# Patient Record
Sex: Male | Born: 1965
Health system: Southern US, Community
[De-identification: ages and names within clinical notes are randomized; demographics above are authoritative.]

## PROBLEM LIST (undated history)

## (undated) DIAGNOSIS — R0902 Hypoxemia: Secondary | ICD-10-CM

## (undated) DIAGNOSIS — N182 Chronic kidney disease, stage 2 (mild): Secondary | ICD-10-CM

## (undated) DIAGNOSIS — J90 Pleural effusion, not elsewhere classified: Secondary | ICD-10-CM

## (undated) DIAGNOSIS — I639 Cerebral infarction, unspecified: Secondary | ICD-10-CM

## (undated) DIAGNOSIS — K859 Acute pancreatitis without necrosis or infection, unspecified: Secondary | ICD-10-CM

## (undated) DIAGNOSIS — I700 Atherosclerosis of aorta: Secondary | ICD-10-CM

## (undated) DIAGNOSIS — D734 Cyst of spleen: Secondary | ICD-10-CM

## (undated) DIAGNOSIS — M4800 Spinal stenosis, site unspecified: Secondary | ICD-10-CM

## (undated) DIAGNOSIS — E8809 Other disorders of plasma-protein metabolism, not elsewhere classified: Secondary | ICD-10-CM

## (undated) DIAGNOSIS — I10 Essential (primary) hypertension: Secondary | ICD-10-CM

## (undated) DIAGNOSIS — K8020 Calculus of gallbladder without cholecystitis without obstruction: Secondary | ICD-10-CM

## (undated) DIAGNOSIS — K5790 Diverticulosis of intestine, part unspecified, without perforation or abscess without bleeding: Secondary | ICD-10-CM

## (undated) DIAGNOSIS — I509 Heart failure, unspecified: Secondary | ICD-10-CM

## (undated) HISTORY — DX: Other disorders of plasma-protein metabolism, not elsewhere classified: E88.09

## (undated) HISTORY — DX: Heart failure, unspecified: I50.9

## (undated) HISTORY — DX: Essential (primary) hypertension: I10

## (undated) HISTORY — DX: Calculus of gallbladder without cholecystitis without obstruction: K80.20

## (undated) HISTORY — DX: Diverticulosis of intestine, part unspecified, without perforation or abscess without bleeding: K57.90

## (undated) HISTORY — DX: Spinal stenosis, site unspecified: M48.00

## (undated) HISTORY — DX: Hypoxemia: R09.02

## (undated) HISTORY — PX: APPENDECTOMY: SHX54

## (undated) HISTORY — DX: Cyst of spleen: D73.4

## (undated) HISTORY — DX: Chronic kidney disease, stage 2 (mild): N18.2

## (undated) HISTORY — PX: KIDNEY CYST REMOVAL: SHX684

## (undated) HISTORY — DX: Cerebral infarction, unspecified: I63.9

## (undated) HISTORY — DX: Atherosclerosis of aorta: I70.0

## (undated) HISTORY — DX: Pleural effusion, not elsewhere classified: J90

---

## 2012-01-31 ENCOUNTER — Emergency Department (HOSPITAL_COMMUNITY): Payer: BC Managed Care – PPO

## 2012-01-31 ENCOUNTER — Inpatient Hospital Stay (HOSPITAL_COMMUNITY)
Admission: EM | Admit: 2012-01-31 | Discharge: 2012-02-06 | DRG: 733 | Disposition: A | Discharge status: Home / Self Care | Payer: BC Managed Care – PPO | Attending: General Surgery | Admitting: General Surgery

## 2012-01-31 ENCOUNTER — Encounter (HOSPITAL_COMMUNITY): Payer: Self-pay | Admitting: *Deleted

## 2012-01-31 DIAGNOSIS — S42102A Fracture of unspecified part of scapula, left shoulder, initial encounter for closed fracture: Secondary | ICD-10-CM | POA: Diagnosis present

## 2012-01-31 DIAGNOSIS — J9383 Other pneumothorax: Secondary | ICD-10-CM

## 2012-01-31 DIAGNOSIS — S42109A Fracture of unspecified part of scapula, unspecified shoulder, initial encounter for closed fracture: Secondary | ICD-10-CM

## 2012-01-31 DIAGNOSIS — S36039A Unspecified laceration of spleen, initial encounter: Secondary | ICD-10-CM

## 2012-01-31 DIAGNOSIS — J9811 Atelectasis: Secondary | ICD-10-CM

## 2012-01-31 DIAGNOSIS — S270XXA Traumatic pneumothorax, initial encounter: Principal | ICD-10-CM | POA: Diagnosis present

## 2012-01-31 DIAGNOSIS — S3600XA Unspecified injury of spleen, initial encounter: Secondary | ICD-10-CM | POA: Diagnosis present

## 2012-01-31 DIAGNOSIS — S37812A Contusion of adrenal gland, initial encounter: Secondary | ICD-10-CM | POA: Diagnosis present

## 2012-01-31 DIAGNOSIS — D62 Acute posthemorrhagic anemia: Secondary | ICD-10-CM | POA: Diagnosis present

## 2012-01-31 DIAGNOSIS — S02109A Fracture of base of skull, unspecified side, initial encounter for closed fracture: Secondary | ICD-10-CM | POA: Diagnosis present

## 2012-01-31 DIAGNOSIS — S27329A Contusion of lung, unspecified, initial encounter: Secondary | ICD-10-CM

## 2012-01-31 DIAGNOSIS — Y998 Other external cause status: Secondary | ICD-10-CM

## 2012-01-31 DIAGNOSIS — S2239XA Fracture of one rib, unspecified side, initial encounter for closed fracture: Secondary | ICD-10-CM | POA: Diagnosis present

## 2012-01-31 DIAGNOSIS — R0902 Hypoxemia: Secondary | ICD-10-CM | POA: Diagnosis present

## 2012-01-31 DIAGNOSIS — G4733 Obstructive sleep apnea (adult) (pediatric): Secondary | ICD-10-CM | POA: Diagnosis present

## 2012-01-31 DIAGNOSIS — T07XXXA Unspecified multiple injuries, initial encounter: Secondary | ICD-10-CM

## 2012-01-31 DIAGNOSIS — S3609XA Other injury of spleen, initial encounter: Secondary | ICD-10-CM | POA: Diagnosis present

## 2012-01-31 DIAGNOSIS — S27322A Contusion of lung, bilateral, initial encounter: Secondary | ICD-10-CM | POA: Diagnosis present

## 2012-01-31 DIAGNOSIS — F17200 Nicotine dependence, unspecified, uncomplicated: Secondary | ICD-10-CM | POA: Diagnosis present

## 2012-01-31 DIAGNOSIS — J90 Pleural effusion, not elsewhere classified: Secondary | ICD-10-CM | POA: Diagnosis present

## 2012-01-31 DIAGNOSIS — F1010 Alcohol abuse, uncomplicated: Secondary | ICD-10-CM | POA: Diagnosis present

## 2012-01-31 DIAGNOSIS — IMO0002 Reserved for concepts with insufficient information to code with codable children: Secondary | ICD-10-CM

## 2012-01-31 DIAGNOSIS — S2249XA Multiple fractures of ribs, unspecified side, initial encounter for closed fracture: Secondary | ICD-10-CM

## 2012-01-31 DIAGNOSIS — S02401A Maxillary fracture, unspecified, initial encounter for closed fracture: Secondary | ICD-10-CM | POA: Diagnosis present

## 2012-01-31 DIAGNOSIS — S32009A Unspecified fracture of unspecified lumbar vertebra, initial encounter for closed fracture: Secondary | ICD-10-CM | POA: Diagnosis present

## 2012-01-31 DIAGNOSIS — Y92410 Unspecified street and highway as the place of occurrence of the external cause: Secondary | ICD-10-CM

## 2012-01-31 DIAGNOSIS — F10929 Alcohol use, unspecified with intoxication, unspecified: Secondary | ICD-10-CM | POA: Diagnosis present

## 2012-01-31 LAB — BASIC METABOLIC PANEL
CO2: 20 mEq/L (ref 19–32)
Chloride: 93 mEq/L — ABNORMAL LOW (ref 96–112)
Creatinine, Ser: 0.99 mg/dL (ref 0.50–1.35)
Potassium: 3.9 mEq/L (ref 3.5–5.1)

## 2012-01-31 LAB — URINALYSIS, MICROSCOPIC ONLY
Nitrite: NEGATIVE
Specific Gravity, Urine: 1.028 (ref 1.005–1.030)
Urobilinogen, UA: 0.2 mg/dL (ref 0.0–1.0)
pH: 5 (ref 5.0–8.0)

## 2012-01-31 LAB — POCT I-STAT, CHEM 8
Glucose, Bld: 238 mg/dL — ABNORMAL HIGH (ref 70–99)
HCT: 42 % (ref 39.0–52.0)
Hemoglobin: 14.3 g/dL (ref 13.0–17.0)
Potassium: 3.5 mEq/L (ref 3.5–5.1)
Sodium: 142 mEq/L (ref 135–145)

## 2012-01-31 LAB — COMPREHENSIVE METABOLIC PANEL
ALT: 121 U/L — ABNORMAL HIGH (ref 0–53)
AST: 127 U/L — ABNORMAL HIGH (ref 0–37)
Albumin: 3.5 g/dL (ref 3.5–5.2)
Alkaline Phosphatase: 56 U/L (ref 39–117)
BUN: 12 mg/dL (ref 6–23)
CO2: 23 mEq/L (ref 19–32)
Calcium: 9 mg/dL (ref 8.4–10.5)
Chloride: 102 mEq/L (ref 96–112)
Creatinine, Ser: 1.5 mg/dL — ABNORMAL HIGH (ref 0.50–1.35)
GFR calc Af Amer: 64 mL/min — ABNORMAL LOW (ref >90)
GFR calc non Af Amer: 55 mL/min — ABNORMAL LOW (ref >90)
Glucose, Bld: 245 mg/dL — ABNORMAL HIGH (ref 70–99)
Potassium: 3.4 mEq/L — ABNORMAL LOW (ref 3.5–5.1)
Sodium: 140 mEq/L (ref 135–145)
Total Bilirubin: 0.2 mg/dL — ABNORMAL LOW (ref 0.3–1.2)
Total Protein: 7.1 g/dL (ref 6.0–8.3)

## 2012-01-31 LAB — CBC
HCT: 40.6 % (ref 39.0–52.0)
Hemoglobin: 13.9 g/dL (ref 13.0–17.0)
MCH: 31.2 pg (ref 26.0–34.0)
MCHC: 34.2 g/dL (ref 30.0–36.0)
MCV: 91.2 fL (ref 78.0–100.0)
MCV: 91.2 fL (ref 78.0–100.0)
Platelets: 251 10*3/uL (ref 150–400)
Platelets: 229 10*3/uL (ref 150–400)
RBC: 4.45 MIL/uL (ref 4.22–5.81)
RBC: 3.87 MIL/uL — ABNORMAL LOW (ref 4.22–5.81)
RDW: 13.5 % (ref 11.5–15.5)
WBC: 21.7 10*3/uL — ABNORMAL HIGH (ref 4.0–10.5)
WBC: 34.6 10*3/uL — ABNORMAL HIGH (ref 4.0–10.5)

## 2012-01-31 LAB — LACTIC ACID, PLASMA: Lactic Acid, Venous: 3.6 mmol/L — ABNORMAL HIGH (ref 0.5–2.2)

## 2012-01-31 LAB — PROTIME-INR
INR: 0.95 (ref 0.00–1.49)
Prothrombin Time: 12.9 seconds (ref 11.6–15.2)

## 2012-01-31 LAB — HEMOGLOBIN A1C
Hgb A1c MFr Bld: 5.5 % (ref <5.7)
Mean Plasma Glucose: 111 mg/dL (ref <117)

## 2012-01-31 LAB — MRSA PCR SCREENING: MRSA by PCR: NEGATIVE

## 2012-01-31 LAB — ETHANOL: Alcohol, Ethyl (B): 188 mg/dL — ABNORMAL HIGH (ref 0–11)

## 2012-01-31 LAB — ABO/RH: ABO/RH(D): O POS

## 2012-01-31 MED ORDER — SODIUM CHLORIDE 0.9 % IV SOLN
INTRAVENOUS | Status: DC
  Administered 2012-01-31 – 2012-02-02 (×5): via INTRAVENOUS

## 2012-01-31 MED ORDER — HYDROMORPHONE HCL PF 1 MG/ML IJ SOLN
1.0000 mg | INTRAMUSCULAR | Status: DC | PRN
  Administered 2012-01-31: 2 mg via INTRAVENOUS
  Administered 2012-01-31: 1 mg via INTRAVENOUS
  Administered 2012-01-31 – 2012-02-01 (×3): 2 mg via INTRAVENOUS
  Filled 2012-01-31 (×4): qty 2

## 2012-01-31 MED ORDER — PANTOPRAZOLE SODIUM 40 MG IV SOLR
40.0000 mg | Freq: Every day | INTRAVENOUS | Status: DC
  Administered 2012-01-31 – 2012-02-01 (×2): 40 mg via INTRAVENOUS
  Filled 2012-01-31 (×4): qty 40

## 2012-01-31 MED ORDER — FENTANYL CITRATE 0.05 MG/ML IJ SOLN
100.0000 ug | Freq: Once | INTRAMUSCULAR | Status: AC
  Administered 2012-01-31: 100 ug via INTRAVENOUS

## 2012-01-31 MED ORDER — HYDROCODONE-ACETAMINOPHEN 5-325 MG PO TABS
1.0000 | ORAL_TABLET | ORAL | Status: DC | PRN
  Administered 2012-01-31 – 2012-02-03 (×14): 2 via ORAL
  Filled 2012-01-31 (×14): qty 2

## 2012-01-31 MED ORDER — CEFAZOLIN SODIUM 1-5 GM-% IV SOLN
1.0000 g | Freq: Three times a day (TID) | INTRAVENOUS | Status: DC
  Administered 2012-01-31 – 2012-02-03 (×10): 1 g via INTRAVENOUS
  Filled 2012-01-31 (×13): qty 50

## 2012-01-31 MED ORDER — TETANUS-DIPHTH-ACELL PERTUSSIS 5-2.5-18.5 LF-MCG/0.5 IM SUSP
0.5000 mL | Freq: Once | INTRAMUSCULAR | Status: AC
  Administered 2012-01-31: 0.5 mL via INTRAMUSCULAR
  Filled 2012-01-31: qty 0.5

## 2012-01-31 MED ORDER — IOHEXOL 300 MG/ML  SOLN
100.0000 mL | Freq: Once | INTRAMUSCULAR | Status: AC | PRN
  Administered 2012-01-31: 100 mL via INTRAVENOUS

## 2012-01-31 MED ORDER — HYDROMORPHONE HCL PF 1 MG/ML IJ SOLN
1.0000 mg | INTRAMUSCULAR | Status: DC | PRN
  Administered 2012-01-31 (×2): 1 mg via INTRAVENOUS
  Filled 2012-01-31 (×3): qty 1

## 2012-01-31 MED ORDER — ONDANSETRON HCL 4 MG/2ML IJ SOLN
4.0000 mg | Freq: Four times a day (QID) | INTRAMUSCULAR | Status: DC | PRN
  Administered 2012-01-31: 4 mg via INTRAVENOUS
  Filled 2012-01-31: qty 2

## 2012-01-31 MED ORDER — ONDANSETRON HCL 4 MG PO TABS: 4.0000 mg | ORAL_TABLET | Freq: Four times a day (QID) | ORAL | Status: DC | PRN

## 2012-01-31 MED ORDER — PANTOPRAZOLE SODIUM 40 MG PO TBEC
40.0000 mg | DELAYED_RELEASE_TABLET | Freq: Every day | ORAL | Status: DC
  Administered 2012-02-02: 40 mg via ORAL
  Filled 2012-01-31: qty 1

## 2012-01-31 MED ORDER — HYDROMORPHONE HCL PF 1 MG/ML IJ SOLN
INTRAMUSCULAR | Status: AC
  Administered 2012-01-31: 1 mg via INTRAVENOUS
  Filled 2012-01-31: qty 1

## 2012-01-31 MED ORDER — HYDROMORPHONE HCL PF 1 MG/ML IJ SOLN
0.5000 mg | INTRAMUSCULAR | Status: DC | PRN
  Filled 2012-01-31: qty 1

## 2012-01-31 MED ORDER — MORPHINE SULFATE 2 MG/ML IJ SOLN
1.0000 mg | Freq: Once | INTRAMUSCULAR | Status: AC
  Administered 2012-01-31: 1 mg via INTRAVENOUS
  Filled 2012-01-31: qty 1

## 2012-01-31 MED ORDER — HYDROMORPHONE HCL PF 1 MG/ML IJ SOLN
1.0000 mg | INTRAMUSCULAR | Status: DC | PRN
  Administered 2012-01-31: 1 mg via INTRAVENOUS

## 2012-01-31 NOTE — Consult Note (Signed)
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 blood noted in the right maxillary sinus, superimposed on chronic mucosal thickening. 2.  Soft tissue swelling and high density debris again noted along the right parietal calvarium.  CT CERVICAL SPINE  Findings:   There is no evidence of fracture or subluxation. Vertebral bodies demonstrate normal height and alignment. Intervertebral disc spaces are preserved.  Prevertebral soft tissues are within normal limits.  The visualized neural foramina are grossly unremarkable.  Evaluation is suboptimal due to motion artifact.  The thyroid gland is unremarkable in appearance.  The visualized lung apices are clear.  No significant soft tissue abnormalities are seen.  IMPRESSION: No evidence of fracture or subluxation along the cervical spine. Evaluation mildly suboptimal due to motion artifact.  Original Report Authenticated By: Tonia Ghent, M.D.   Review of systems Previous kidneys cyst removal. He is otherwise healthy. He was on no medication.  Blood pressure 156/108, pulse 97, temperature 97.9 F (36.6 C), temperature source Oral, resp. rate 28, height 6' (1.829 m), weight 90.719 kg (200 lb), SpO2 100.00%.  Physical Exam:  Gen: Well-developed well-nourished white male in no acute  distress. Neurological: Alert and oriented to person, place, and time. CN 2-12 all grossly intact Head: Normocephalic and abrasion of the right scalp. Eyes: Conjunctivae are normal. Pupils are equal, round, and reactive to light. No scleral icterus. Vision intact bilaterally. Extraocular motion is intact. No entrapment is noted. Nose: Dry blood is noted within the right nasal cavity. No acute bleeding. Neck: Normal range of motion. Neck supple. No tracheal deviation or thyromegaly present.  Musculoskeletal: . No cyanosis, edema or clubbing noted Lymphadenopathy: No cervical, preauricular, postauricular or axillary adenopathy is present Skin: Skin is warm and dry. No rash noted. No diaphoresis. No erythema. No pallor.    Assessment/Plan: Nondisplaced right medial maxillary wall fracture. It is asymptomatic and likely will not need any acute intervention. He is at slightly increased risk of developing sinusitis. He may follow up with me as an outpatient after discharge.   Ouita Nish,SUI W 01/31/2012, 7:57 AM

## 2012-01-31 NOTE — Progress Notes (Signed)
Nursing Note  C spine cleared per Dr. Corliss Skains. C-collar removed.    L Cederick Broadnax RN

## 2012-01-31 NOTE — Progress Notes (Addendum)
MD Lindie Spruce made aware of continued pain despite current medication interventions and also of pt's high CBG and SPB

## 2012-01-31 NOTE — ED Notes (Signed)
Pt noted to have multiple lacerations to rt side of head.
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
## 2012-02-01 ENCOUNTER — Inpatient Hospital Stay (HOSPITAL_COMMUNITY): Payer: BC Managed Care – PPO

## 2012-02-01 LAB — CBC WITH DIFFERENTIAL/PLATELET
Basophils Absolute: 0 10*3/uL (ref 0.0–0.1)
Basophils Relative: 0 % (ref 0–1)
HCT: 32.7 % — ABNORMAL LOW (ref 39.0–52.0)
Lymphocytes Relative: 5 % — ABNORMAL LOW (ref 12–46)
Neutro Abs: 21.4 10*3/uL — ABNORMAL HIGH (ref 1.7–7.7)
Neutrophils Relative %: 90 % — ABNORMAL HIGH (ref 43–77)
Platelets: 216 10*3/uL (ref 150–400)
RDW: 13.8 % (ref 11.5–15.5)
WBC: 23.8 10*3/uL — ABNORMAL HIGH (ref 4.0–10.5)

## 2012-02-01 LAB — BASIC METABOLIC PANEL
CO2: 28 mEq/L (ref 19–32)
Calcium: 8.8 mg/dL (ref 8.4–10.5)
Chloride: 98 mEq/L (ref 96–112)
GFR calc non Af Amer: >90 mL/min (ref >90)
Potassium: 4.4 mEq/L (ref 3.5–5.1)
Sodium: 135 mEq/L (ref 135–145)

## 2012-02-01 NOTE — Consult Note (Signed)
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  Concern for facial and cervical spine injury.  CT HEAD WITHOUT CONTRAST CT MAXILLOFACIAL WITHOUT CONTRAST CT CERVICAL SPINE WITHOUT CONTRAST  Technique:  Multidetector CT imaging of the head, cervical spine, and maxillofacial structures were performed using the standard protocol without intravenous contrast. Multiplanar CT image reconstructions of the cervical spine and maxillofacial structures were also generated.  Comparison:  MRI of the brain performed 07/31/2004  CT HEAD  Findings: There is no evidence of acute infarction, mass lesion, or intra- or extra-axial hemorrhage on CT.  Evaluation is suboptimal due to motion artifact.  The posterior fossa, including the cerebellum, brainstem and fourth ventricle, is within normal limits.  The third and lateral ventricles, and basal ganglia are unremarkable in appearance.  The cerebral hemispheres are symmetric in appearance, with normal gray- white differentiation.  No mass effect or midline shift is seen.  High-density material largely filling the right maxillary sinus is thought to reflect blood superimposed on chronic mucosal thickening.  This may reflect a fracture through the medial wall of the  right maxillary sinus, as no orbital floor fracture is seen on maxillofacial images.  The orbits are within normal limits.  The remaining paranasal sinuses  and mastoid air cells are well-aerated.  Prominent soft tissue disruption and swelling are noted along the right parietal calvarium, with several foci of high density debris embedded in the superficial soft tissues.  IMPRESSION:  1.  No evidence of traumatic intracranial injury. 2.  High-density material largely filling the right maxillary sinus is thought to reflect blood superimposed on chronic mucosal thickening.  This may reflect a fracture through the medial wall of the right maxillary sinus, as no orbital floor fracture is seen on maxillofacial images. 3.  Prominent soft tissue disruption and swelling along the right parietal calvarium, with several foci of high density debris embedded in the superficial soft tissues.  CT MAXILLOFACIAL  Findings:  There is a likely fracture through the medial wall of the right maxillary sinus, with high-density material in the right maxillary sinus, likely reflecting blood superimposed on chronic mucosal thickening.  The mandible appears intact.  The nasal bone is unremarkable in appearance.  The visualized dentition demonstrates no acute abnormality.  Chronic dental abnormalities are characterized.  The orbits are intact bilaterally.  The paranasal sinuses are clear.  Soft tissue swelling and high density debris are again noted along the right parietal calvarium.  The parapharyngeal fat planes are preserved.  The nasopharynx, oropharynx and hypopharynx are unremarkable in appearance.  The visualized portions of the valleculae and piriform sinuses are grossly unremarkable.  The parotid and submandibular glands are within normal limits.  No cervical lymphadenopathy is seen.  IMPRESSION:  1.  Likely fracture through the medial wall of the right maxillary sinus, with blood noted in the right maxillary sinus, superimposed on  chronic mucosal thickening. 2.  Soft tissue swelling and high density debris again noted along the right parietal calvarium.  CT CERVICAL SPINE  Findings:   There is no evidence of fracture or subluxation. Vertebral bodies demonstrate normal height and alignment. Intervertebral disc spaces are preserved.  Prevertebral soft tissues are within normal limits.  The visualized neural foramina are grossly unremarkable.  Evaluation is suboptimal due to motion artifact.  The thyroid gland is unremarkable in appearance.  The visualized lung apices are clear.  No significant soft tissue abnormalities are seen.  IMPRESSION: No evidence of fracture or subluxation along the cervical spine. Evaluation mildly suboptimal due to motion artifact.  Original Report Authenticated By: Tonia Ghent, M.D.    Review of Systems  All other systems reviewed and are negative.   Blood pressure 162/76, pulse 112, temperature 98.1 F (36.7 C), temperature source Oral, resp. rate 26, height 6' (1.829 m), weight 90.6 kg (199 lb 11.8 oz), SpO2 96.00%. Physical Exam  Constitutional: He is oriented to person, place, and time. He appears well-developed.  Eyes: EOM are normal.  Cardiovascular: Intact distal pulses.   Musculoskeletal:       Examination of the left upper extremity demonstrates some abrasions over the posterior scapula.  He has tenderness over the scapular body.  No tenderness over the clavicle.  He has difficulty maintaining external rotation of the shoulder with a lot of pain but likely some underlying weakness.  Distally he has normal sensation of light touch in M/U/R nerve distribution.  He has intact hand intrinsics grip and EPL.  No elbow or wrist tenderness. No other obvious musculoskeletal deformities or tenderness.  Neurological: He is alert and oriented to person, place, and time.  Skin: Skin is warm and dry.  Psychiatric: He has a normal mood and affect.    Assessment/Plan: Comminuted left scapular body  fracture which  is extra-articular. This should do well with conservative management.  He can have a sling for comfort.  He can work on gentle hand wrist elbow and shoulder motion when comfortable. He has weakness in external rotation which is likely secondary to pain but cannot rule out suprascapular nerve injury at this time.  Will follow clinically. Will order x-rays of the shoulder today. He can followup in my office in 2-3 weeks for recheck and rex-ray.  Mable Paris 02/01/2012, 11:37 AM
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
## 2012-02-01 NOTE — Progress Notes (Signed)
Orthopedic Tech Progress Note Patient Details:  Paul [name] [birth year] [id number]  Ortho Devices Type of Ortho Device: Arm foam sling Ortho Device/Splint Location: left UE Ortho Device/Splint Interventions: Application   Liany Mumpower T 02/01/2012, 1:12 PM

## 2012-02-02 ENCOUNTER — Inpatient Hospital Stay (HOSPITAL_COMMUNITY): Payer: BC Managed Care – PPO

## 2012-02-02 LAB — TYPE AND SCREEN: Unit division: 0

## 2012-02-02 MED ORDER — SODIUM CHLORIDE 0.9 % IJ SOLN: 3.0000 mL | INTRAMUSCULAR | Status: DC | PRN
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
## 2012-02-02 NOTE — Progress Notes (Signed)
UR complete 
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
## 2012-02-03 ENCOUNTER — Inpatient Hospital Stay (HOSPITAL_COMMUNITY): Payer: BC Managed Care – PPO

## 2012-02-03 DIAGNOSIS — F10929 Alcohol use, unspecified with intoxication, unspecified: Secondary | ICD-10-CM | POA: Diagnosis present

## 2012-02-03 DIAGNOSIS — D62 Acute posthemorrhagic anemia: Secondary | ICD-10-CM | POA: Diagnosis not present

## 2012-02-03 DIAGNOSIS — S37812A Contusion of adrenal gland, initial encounter: Secondary | ICD-10-CM | POA: Diagnosis present

## 2012-02-03 DIAGNOSIS — S32009A Unspecified fracture of unspecified lumbar vertebra, initial encounter for closed fracture: Secondary | ICD-10-CM | POA: Diagnosis present

## 2012-02-03 DIAGNOSIS — S270XXA Traumatic pneumothorax, initial encounter: Secondary | ICD-10-CM | POA: Diagnosis present

## 2012-02-03 LAB — CBC
HCT: 29 % — ABNORMAL LOW (ref 39.0–52.0)
Hemoglobin: 10 g/dL — ABNORMAL LOW (ref 13.0–17.0)
MCH: 31 pg (ref 26.0–34.0)
MCHC: 34.5 g/dL (ref 30.0–36.0)
MCV: 89.8 fL (ref 78.0–100.0)
RBC: 3.23 MIL/uL — ABNORMAL LOW (ref 4.22–5.81)

## 2012-02-03 LAB — EXPECTORATED SPUTUM ASSESSMENT W GRAM STAIN, RFLX TO RESP C

## 2012-02-03 MED ORDER — BACITRACIN ZINC 500 UNIT/GM EX OINT
TOPICAL_OINTMENT | Freq: Two times a day (BID) | CUTANEOUS | Status: DC
  Administered 2012-02-03 – 2012-02-05 (×6): via TOPICAL
  Administered 2012-02-06: 1 via TOPICAL
  Filled 2012-02-03: qty 15

## 2012-02-03 MED ORDER — NAPROXEN 500 MG PO TABS
500.0000 mg | ORAL_TABLET | Freq: Two times a day (BID) | ORAL | Status: DC
  Administered 2012-02-03 – 2012-02-06 (×7): 500 mg via ORAL
  Filled 2012-02-03 (×10): qty 1

## 2012-02-03 MED ORDER — IPRATROPIUM-ALBUTEROL 18-103 MCG/ACT IN AERO
2.0000 | INHALATION_SPRAY | RESPIRATORY_TRACT | Status: DC
  Administered 2012-02-03 – 2012-02-04 (×5): 2 via RESPIRATORY_TRACT
  Filled 2012-02-03: qty 14.7

## 2012-02-03 MED ORDER — HYDROCODONE-ACETAMINOPHEN 5-325 MG PO TABS
1.0000 | ORAL_TABLET | ORAL | Status: DC | PRN
  Administered 2012-02-03 – 2012-02-06 (×10): 2 via ORAL
  Filled 2012-02-03 (×10): qty 2

## 2012-02-03 MED ORDER — MOXIFLOXACIN HCL 400 MG PO TABS
400.0000 mg | ORAL_TABLET | ORAL | Status: DC
  Administered 2012-02-03 – 2012-02-06 (×4): 400 mg via ORAL
  Filled 2012-02-03 (×6): qty 1

## 2012-02-03 MED ORDER — HYDROMORPHONE HCL PF 1 MG/ML IJ SOLN
0.5000 mg | INTRAMUSCULAR | Status: DC | PRN
  Administered 2012-02-06: 0.5 mg via INTRAVENOUS
  Filled 2012-02-03 (×2): qty 1

## 2012-02-03 NOTE — Progress Notes (Signed)
CXR improved.  Sputum Cx P. Patient examined and I agree with the assessment and plan  Violeta Gelinas, MD, MPH, FACS Pager: [phone]  02/03/2012 10:53 AM
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
## 2012-02-04 ENCOUNTER — Inpatient Hospital Stay (HOSPITAL_COMMUNITY): Payer: BC Managed Care – PPO

## 2012-02-04 LAB — CBC
Hemoglobin: 9.4 g/dL — ABNORMAL LOW (ref 13.0–17.0)
MCH: 30.6 pg (ref 26.0–34.0)
MCHC: 34.1 g/dL (ref 30.0–36.0)
RDW: 13.7 % (ref 11.5–15.5)

## 2012-02-04 MED ORDER — IPRATROPIUM-ALBUTEROL 18-103 MCG/ACT IN AERO: 2.0000 | INHALATION_SPRAY | Freq: Four times a day (QID) | RESPIRATORY_TRACT | Status: DC | PRN

## 2012-02-04 NOTE — Progress Notes (Signed)
Occupational Therapy Treatment Patient Details Name: Paul [name] MRN: [id number] DOB: 07-02-1966 Today's Date: 02/04/2012 Time: 1355-     OT Assessment / Plan / Recommendation Comments on Treatment Session Pt making good progress. Completed family education. Able to achieve 90 FF in AAROM using table for support. . @45  in Abd. Family education regarding ADL completed and sling management.. Pt verbalized understanding of exercises    Follow Up Recommendations  Outpatient OT    Barriers to Discharge       Equipment Recommendations  None recommended by OT    Recommendations for Other Services    Frequency Min 3X/week   Plan Discharge plan remains appropriate    Precautions / Restrictions Precautions Precautions: Shoulder Type of Shoulder Precautions: No pushing, pulling, lifting  with LUE Precaution Comments: Gentle shoulder ROM to pt's tolerance; elbow, wrist, hand exercises Required Braces or Orthoses: Other Brace/Splint (sling) Restrictions Weight Bearing Restrictions: Yes LUE Weight Bearing: Non weight bearing Other Position/Activity Restrictions: NWB LUE   Pertinent Vitals/Pain 3    ADL  Upper Body Dressing: Performed;Supervision/safety Where Assessed - Upper Body Dressing: Supported sitting Lower Body Dressing: Performed;Set up Where Assessed - Lower Body Dressing: Unsupported sit to stand Transfers/Ambulation Related to ADLs: independent ADL Comments: Educated pt/family on ADL, restrictions and sling mgnt.     OT Diagnosis:    OT Problem List:   OT Treatment Interventions:     OT Goals Acute Rehab OT Goals OT Goal Formulation: With patient Time For Goal Achievement: 02/09/12 Potential to Achieve Goals: Good Arm Goals Additional Arm Goal #1: Pt will perform LUE AAROM for L shoulder and AROM L elbow, wrist and hand Ily in sitting Arm Goal: Additional Goal #1 - Progress: Progressing toward goals  Visit Information  Last OT Received On: 02/04/12    Subjective  Data      Prior Functioning       Cognition       Mobility     Exercises General Exercises - Upper Extremity Shoulder Flexion: AAROM;5 reps;Standing;Left Shoulder Extension: AAROM;5 reps;Standing;Left Shoulder ABduction: AAROM;Standing;5 reps;Left Shoulder ADduction: AAROM;5 reps;Standing Elbow Flexion: AROM;5 reps;Standing;Left Elbow Extension: AROM;Left;10 reps;Standing Other Exercises Other Exercises: Making progress with shoulder ROM. Completing within pain tolerance. Used table glides as option to AAROM to relieve pain during shoulder extension  Balance    End of Session OT - End of Session Activity Tolerance: Patient tolerated treatment well Patient left: in chair;with call bell/phone within reach;with family/visitor present  GO     Saylor Murry,HILLARY 02/04/2012, 2:35 PM [facility], OTR/L  [phone] 02/04/2012

## 2012-02-04 NOTE — Progress Notes (Signed)
Increase in right PTX after chest tube removal.  Will place on NRB mask and see if there is any improvement over the next 24 hours.  Hgb down a bit also.  This patient has been seen and I agree with the findings and treatment plan.  Marta Lamas. Gae Bon, MD, FACS [phone] (pager) [phone] (direct pager) Trauma Surgeon
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
## 2012-02-04 NOTE — Progress Notes (Signed)
Patient's SpO2 level laying down without O2 is 88% and 79 % walking on room air.

## 2012-02-05 ENCOUNTER — Inpatient Hospital Stay (HOSPITAL_COMMUNITY): Payer: BC Managed Care – PPO

## 2012-02-05 DIAGNOSIS — S2239XA Fracture of one rib, unspecified side, initial encounter for closed fracture: Secondary | ICD-10-CM

## 2012-02-05 DIAGNOSIS — J9383 Other pneumothorax: Secondary | ICD-10-CM

## 2012-02-05 DIAGNOSIS — S27329A Contusion of lung, unspecified, initial encounter: Secondary | ICD-10-CM

## 2012-02-05 DIAGNOSIS — R0902 Hypoxemia: Secondary | ICD-10-CM

## 2012-02-05 DIAGNOSIS — J90 Pleural effusion, not elsewhere classified: Secondary | ICD-10-CM

## 2012-02-05 DIAGNOSIS — J9811 Atelectasis: Secondary | ICD-10-CM

## 2012-02-05 DIAGNOSIS — J9819 Other pulmonary collapse: Secondary | ICD-10-CM

## 2012-02-05 LAB — LACTATE DEHYDROGENASE, PLEURAL OR PERITONEAL FLUID

## 2012-02-05 LAB — BODY FLUID CELL COUNT WITH DIFFERENTIAL
Lymphs, Fluid: 17 %
Monocyte-Macrophage-Serous Fluid: 9 % — ABNORMAL LOW (ref 50–90)

## 2012-02-05 LAB — CULTURE, RESPIRATORY W GRAM STAIN: Culture: NORMAL

## 2012-02-05 LAB — PROTEIN, BODY FLUID: Total protein, fluid: 3.7 g/dL

## 2012-02-05 MED ORDER — IOHEXOL 350 MG/ML SOLN
100.0000 mL | Freq: Once | INTRAVENOUS | Status: AC | PRN
  Administered 2012-02-05: 100 mL via INTRAVENOUS

## 2012-02-05 NOTE — Progress Notes (Signed)
UR complete 

## 2012-02-05 NOTE — Progress Notes (Signed)
Chest CT reviewed with interventional radiologist.  Large left pleural effusion has simple fluid density.  Will plan image guided thoracentesis by IR.  I spoke to the patient and he is agreeable.  Hopefully this will improve his oxygenation. Patient examined and I agree with the assessment and plan  Violeta Gelinas, MD, MPH, FACS Pager: [phone]  02/05/2012 2:01 PM

## 2012-02-05 NOTE — Procedures (Signed)
Thoracentesis Procedure Note  Pre-operative Diagnosis: Pleural effusion  Post-operative Diagnosis: same  Indications: Left sided pleural effusion  Procedure Details  Consent: Informed consent was obtained. Risks of the procedure were discussed including: infection, bleeding, pain, pneumothorax.  Under sterile conditions the patient was positioned. Betadine solution and sterile drapes were utilized.  1% plain lidocaine was used to anesthetize the 5 rib space. Fluid was obtained without any difficulties and minimal blood loss.  A dressing was applied to the wound and wound care instructions were provided.   Findings 700 ml of bloody pleural fluid was obtained. A sample was sent to Pathology for cytogenetics, flow, and cell counts, as well as for infection analysis.  Complications:  None; patient tolerated the procedure well.          Condition: stable  Plan A follow up chest x-ray was ordered. Bed Rest for 1 hours. Tylenol 650 mg. for pain.  Attending Attestation: I was present for the entire procedure.  U/S used in placement.  CXR ordered and pending.  Alyson Reedy, M.D. [facility] Pulmonary/Critical Care Medicine. Pager: [phone]. After hours pager: [phone].
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
## 2012-02-05 NOTE — Progress Notes (Signed)
CT was reviewed, large left sided pleural effusion amongst a few other findings.  Will perform thora to see if blood is present in chest cavity, if present will drain dry.  IS will be ordered.  Pain control and agree with other management.  Alyson Reedy, M.D. [facility] Pulmonary/Critical Care Medicine. Pager: [phone]. After hours pager: [phone].

## 2012-02-05 NOTE — Significant Event (Signed)
Ambulated in hall post thoracentesis.   Room air sats at rest: 98-99%. Walked around [employer]	[place]. Sats dropped to 93% at lowest. Had no dyspnea. He reports he feels much better. Has no chest pressure and is breathing much easier.   Anders Simmonds ACNP-BC [facility] Pulmonary/Critical Care Pager # [phone] OR # [phone] if no answer
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
## 2012-02-06 ENCOUNTER — Inpatient Hospital Stay (HOSPITAL_COMMUNITY): Payer: BC Managed Care – PPO

## 2012-02-06 ENCOUNTER — Telehealth: Payer: Self-pay | Admitting: *Deleted

## 2012-02-06 LAB — CBC
Hemoglobin: 9.2 g/dL — ABNORMAL LOW (ref 13.0–17.0)
MCHC: 33.7 g/dL (ref 30.0–36.0)
RBC: 3.03 MIL/uL — ABNORMAL LOW (ref 4.22–5.81)

## 2012-02-06 LAB — FUNGAL STAIN

## 2012-02-06 LAB — CHOLESTEROL, BODY FLUID

## 2012-02-06 LAB — PH, BODY FLUID: pH, Fluid: 8

## 2012-02-06 MED ORDER — GUAIFENESIN ER 600 MG PO TB12
600.0000 mg | ORAL_TABLET | Freq: Two times a day (BID) | ORAL | Status: DC
  Administered 2012-02-06: 600 mg via ORAL
  Filled 2012-02-06 (×3): qty 1

## 2012-02-06 MED ORDER — HYDROCODONE-ACETAMINOPHEN 7.5-325 MG PO TABS
1.0000 | ORAL_TABLET | ORAL | Status: DC | PRN
  Administered 2012-02-06: 1 via ORAL
  Filled 2012-02-06: qty 1

## 2012-02-06 MED ORDER — LEVOFLOXACIN IN D5W 750 MG/150ML IV SOLN
750.0000 mg | INTRAVENOUS | Status: DC
  Filled 2012-02-06: qty 150

## 2012-02-06 MED ORDER — IBUPROFEN 600 MG PO TABS: 600.0000 mg | ORAL_TABLET | Freq: Once | ORAL | Status: DC

## 2012-02-06 MED ORDER — LEVOFLOXACIN 750 MG PO TABS: 750.0000 mg | ORAL_TABLET | Freq: Every day | ORAL | Status: AC

## 2012-02-06 MED ORDER — NAPROXEN 500 MG PO TABS: 500.0000 mg | ORAL_TABLET | Freq: Two times a day (BID) | ORAL | Status: AC

## 2012-02-06 MED ORDER — HYDROMORPHONE HCL PF 1 MG/ML IJ SOLN
0.5000 mg | Freq: Once | INTRAMUSCULAR | Status: AC
  Administered 2012-02-06: 0.5 mg via INTRAVENOUS

## 2012-02-06 MED ORDER — HYDROCODONE-ACETAMINOPHEN 7.5-325 MG PO TABS: 1.0000 | ORAL_TABLET | ORAL | Status: AC | PRN

## 2012-02-06 NOTE — Progress Notes (Signed)
Weaned pt off O2, stats 94%. Pt ambulated in hallway stats 98-100%. Pt O2 stat remains WNL.
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
## 2012-02-06 NOTE — Progress Notes (Signed)
Pt to require O2 at home only at night due to desat at night in the 70's.  Because it was not documented that pt remained at that low level for at least 5 minutes, [employer] won't pay for home O2.  So an overnight sat monitor was ordered through [facility] and he will be able to get the nighttime O2 based on those results. Oxygen issues to be managed by [facility]. Pt's wife is a [facility] for [employer] and stated understanding of all the above.

## 2012-02-06 NOTE — Progress Notes (Signed)
RA sats good with exercise.  Likely OSA.  Noted pulmonary plan for O2 at HS 2L and outpatient sleep study.  Will D/C. Patient examined and I agree with the assessment and plan  Violeta Gelinas, MD, MPH, FACS Pager: [phone]  02/06/2012 10:41 AM

## 2012-02-06 NOTE — Progress Notes (Signed)
Occupational Therapy Treatment Patient Details Name: Paul [name] MRN: [id number] DOB: 20-Sep-1965 Today's Date: 02/06/2012 Time: 4782-9562 OT Time Calculation (min): 15 min  OT Assessment / Plan / Recommendation Comments on Treatment Session Pt with excellent progress and is able to do all exercise with or without A. Will benefit from continued OT to increase ROM in left shoulder.    Follow Up Recommendations  Outpatient OT    Barriers to Discharge       Equipment Recommendations  None recommended by OT    Recommendations for Other Services    Frequency Min 3X/week   Plan Discharge plan remains appropriate    Precautions / Restrictions Precautions Precautions: Shoulder Type of Shoulder Precautions: No pushing, pulling, or lifting with LUE Precaution Comments: Gentle shoulder ROM to pt's tolerance; elbow, wrist, hand exercises Restrictions Weight Bearing Restrictions: Yes LUE Weight Bearing: Non weight bearing        ADL  ADL Comments: Pt can doff and donn his sling independently    OT Diagnosis:    OT Problem List:   OT Treatment Interventions:     OT Goals Arm Goals Arm Goal: Additional Goal #1 - Progress: Met  Visit Information  Last OT Received On: 02/06/12 Assistance Needed: +1    Subjective Data  Subjective: It's not hurting quite as much as it has been   Prior Functioning       Cognition  Overall Cognitive Status: Appears within functional limits for tasks assessed/performed Arousal/Alertness: Awake/alert Orientation Level: Appears intact for tasks assessed Behavior During Session: [facility] for tasks performed    Mobility Bed Mobility Bed Mobility: Supine to Sit Supine to Sit: 7: Independent Transfers Transfers: Sit to Stand;Stand to Sit Sit to Stand: 7: Independent;Without upper extremity assist;From bed Stand to Sit: 7: Independent;Without upper extremity assist;To bed   Exercises Other Exercises Other Exercises: Pt able to do his own SROM for  shoulder flexion/extension, shoulder internal/external rotation, needs A for shoulder abduction/adduction with elbow fully extended. No issues with elbow, wrist, hand. Gave pt the OK to do exercises while layin in bed with scapula supported.  Balance    End of Session OT - End of Session Activity Tolerance: Patient tolerated treatment well Patient left: in bed;with call bell/phone within reach;with family/visitor present       Evette Georges [phone] 02/06/2012, 11:00 AM
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
## 2012-02-06 NOTE — Discharge Summary (Signed)
Woodrow Dulski, MD, MPH, FACS Pager: [phone]  
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
## 2012-02-06 NOTE — Progress Notes (Signed)
Pt A/O, O2 99% RA. No noted distress. Pt has been educated on medications and follow up visit. Saline D/C skin intact, no skin irritation. Pt was able verbally demonstrate understanding of discharge orders. Old dressing removed, no noted drainage, skin cleaned, and new dressing applied to area. Educated pt on hand hygiene when changing dressings on site. Family will be transporting patient home.

## 2012-02-06 NOTE — Progress Notes (Signed)
Notified Dr. Carolynne Edouard of pt O2 sat decreasing down to 72%/RA. Increased to 96% on 2L nasal cannula. Received no new orders. Will get am chest X-ray now. Pt does not appear distressed. Reports pain on L side RN to give pain medication. Wheezing auscultated bilaterally.
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
## 2012-02-06 NOTE — Progress Notes (Addendum)
Notified Dr. Abbey Chatters of pt's increased pain to L lateral chest. Pt continues to rate pain 8/10 even with PO and IV pain medication. Received new orders for CBC this am and additional dose of 0.5 mg IV dilaudid. Pt does not appear to be in distress and VSS.

## 2012-02-08 LAB — BODY FLUID CULTURE: Culture: NO GROWTH

## 2012-02-09 NOTE — Telephone Encounter (Signed)
Pt is already scheduled for new consult/HFA with Mw on 02/10/12. Per Verlon Au, pt can see MW and if Mw feels pr needs sleep study he will order then refer tio one of the sleep physicians for follow-up

## 2012-02-09 NOTE — Telephone Encounter (Signed)
Patient needs a consult appt with any of our sleep doctors. I will forward to Nicholos Johns so she may contact the patient and get this set up.

## 2012-02-10 ENCOUNTER — Ambulatory Visit (INDEPENDENT_AMBULATORY_CARE_PROVIDER_SITE_OTHER)
Admission: RE | Admit: 2012-02-10 | Discharge: 2012-02-10 | Disposition: A | Discharge status: Home / Self Care | Payer: BC Managed Care – PPO | Source: Ambulatory Visit | Attending: Internal Medicine | Admitting: Internal Medicine

## 2012-02-10 ENCOUNTER — Ambulatory Visit (INDEPENDENT_AMBULATORY_CARE_PROVIDER_SITE_OTHER): Payer: BC Managed Care – PPO | Admitting: Internal Medicine

## 2012-02-10 ENCOUNTER — Encounter: Payer: Self-pay | Admitting: Internal Medicine

## 2012-02-10 VITALS — BP 132/72 | HR 91 | Temp 97.6°F | Ht 72.0 in | Wt 199.0 lb

## 2012-02-10 DIAGNOSIS — S27329A Contusion of lung, unspecified, initial encounter: Secondary | ICD-10-CM

## 2012-02-10 DIAGNOSIS — J90 Pleural effusion, not elsewhere classified: Secondary | ICD-10-CM

## 2012-02-10 NOTE — Progress Notes (Deleted)
dsfas

## 2012-02-10 NOTE — Patient Instructions (Addendum)
Please remember to go to the  x-ray department downstairs for your tests - we will call you with the results when they are available.  No pulmonary follow up needed  Late ADD: Increase L effusion atx > rx with IS, f/u in 2 weeks (placed in tickle)
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
## 2012-02-11 ENCOUNTER — Telehealth: Payer: Self-pay | Admitting: *Deleted

## 2012-02-11 NOTE — Telephone Encounter (Signed)
error 

## 2012-02-11 NOTE — Progress Notes (Signed)
Quick Note:  Called patients number provided, no answer. [id number]. ______

## 2012-02-11 NOTE — Progress Notes (Signed)
Quick Note:  Spoke with pt and notified of results per Dr. Wert. Pt verbalized understanding and denied any questions.  ______ 
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
## 2012-02-24 ENCOUNTER — Other Ambulatory Visit: Payer: Self-pay | Admitting: Internal Medicine

## 2012-02-24 DIAGNOSIS — J90 Pleural effusion, not elsewhere classified: Secondary | ICD-10-CM

## 2012-02-25 ENCOUNTER — Encounter: Payer: BC Managed Care – PPO | Admitting: Internal Medicine

## 2012-02-25 NOTE — Progress Notes (Signed)
 This encounter was created in error - please disregard.

## 2012-03-03 ENCOUNTER — Ambulatory Visit (INDEPENDENT_AMBULATORY_CARE_PROVIDER_SITE_OTHER): Payer: BC Managed Care – PPO | Admitting: Internal Medicine

## 2012-03-03 ENCOUNTER — Ambulatory Visit (INDEPENDENT_AMBULATORY_CARE_PROVIDER_SITE_OTHER)
Admission: RE | Admit: 2012-03-03 | Discharge: 2012-03-03 | Disposition: A | Discharge status: Home / Self Care | Payer: BC Managed Care – PPO | Source: Ambulatory Visit | Attending: Internal Medicine | Admitting: Internal Medicine

## 2012-03-03 ENCOUNTER — Encounter: Payer: Self-pay | Admitting: Internal Medicine

## 2012-03-03 VITALS — BP 128/88 | HR 87 | Temp 98.7°F | Ht 72.0 in | Wt 187.2 lb

## 2012-03-03 DIAGNOSIS — J90 Pleural effusion, not elsewhere classified: Secondary | ICD-10-CM
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
## 2012-03-03 NOTE — Patient Instructions (Addendum)
You have mild residual Left sided fluid collection that prevents the lung from expanding - use Incentive spirometry as much as you can and be as active as your shoulder will let you  You are cleared to work from a pulmonary perspective but clearly the L shoulder is your limiting problem  Please schedule a follow up office visit in 6 weeks, call sooner if needed with cxr

## 2012-03-19 LAB — AFB CULTURE WITH SMEAR (NOT AT ARMC)

## 2012-04-14 ENCOUNTER — Ambulatory Visit (INDEPENDENT_AMBULATORY_CARE_PROVIDER_SITE_OTHER)
Admission: RE | Admit: 2012-04-14 | Discharge: 2012-04-14 | Disposition: A | Discharge status: Home / Self Care | Payer: BC Managed Care – PPO | Source: Ambulatory Visit | Attending: Internal Medicine | Admitting: Internal Medicine

## 2012-04-14 ENCOUNTER — Ambulatory Visit (INDEPENDENT_AMBULATORY_CARE_PROVIDER_SITE_OTHER): Payer: BC Managed Care – PPO | Admitting: Internal Medicine

## 2012-04-14 ENCOUNTER — Encounter: Payer: Self-pay | Admitting: Internal Medicine

## 2012-04-14 VITALS — BP 172/100 | HR 69 | Temp 97.9°F | Ht 72.0 in | Wt 183.0 lb

## 2012-04-14 DIAGNOSIS — J90 Pleural effusion, not elsewhere classified: Secondary | ICD-10-CM
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
## 2012-04-14 NOTE — Patient Instructions (Addendum)
cxr is basically normal now and now further pulmonary follow up needed   Avoid salt due to your blood pressure problems especially while on prednisone and let Dr Kerry Hough recheck it w/in next couple of weeks
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
## 2018-05-20 ENCOUNTER — Emergency Department (HOSPITAL_COMMUNITY): Payer: Self-pay

## 2018-05-20 ENCOUNTER — Encounter (HOSPITAL_COMMUNITY): Payer: Self-pay

## 2018-05-20 ENCOUNTER — Other Ambulatory Visit: Payer: Self-pay

## 2018-05-20 ENCOUNTER — Inpatient Hospital Stay (HOSPITAL_COMMUNITY): Payer: Self-pay

## 2018-05-20 ENCOUNTER — Inpatient Hospital Stay (HOSPITAL_COMMUNITY)
Admission: EM | Admit: 2018-05-20 | Discharge: 2018-05-23 | DRG: 418 | Disposition: A | Discharge status: Home / Self Care | Payer: Self-pay | Attending: Family Medicine | Admitting: Family Medicine

## 2018-05-20 DIAGNOSIS — I10 Essential (primary) hypertension: Secondary | ICD-10-CM

## 2018-05-20 DIAGNOSIS — K8010 Calculus of gallbladder with chronic cholecystitis without obstruction: Secondary | ICD-10-CM | POA: Diagnosis present

## 2018-05-20 DIAGNOSIS — I5032 Chronic diastolic (congestive) heart failure: Secondary | ICD-10-CM | POA: Diagnosis present

## 2018-05-20 DIAGNOSIS — Z801 Family history of malignant neoplasm of trachea, bronchus and lung: Secondary | ICD-10-CM

## 2018-05-20 DIAGNOSIS — Z720 Tobacco use: Secondary | ICD-10-CM

## 2018-05-20 DIAGNOSIS — N179 Acute kidney failure, unspecified: Secondary | ICD-10-CM | POA: Diagnosis present

## 2018-05-20 DIAGNOSIS — I2510 Atherosclerotic heart disease of native coronary artery without angina pectoris: Secondary | ICD-10-CM

## 2018-05-20 DIAGNOSIS — N182 Chronic kidney disease, stage 2 (mild): Secondary | ICD-10-CM | POA: Diagnosis present

## 2018-05-20 DIAGNOSIS — E785 Hyperlipidemia, unspecified: Secondary | ICD-10-CM

## 2018-05-20 DIAGNOSIS — K859 Acute pancreatitis without necrosis or infection, unspecified: Secondary | ICD-10-CM | POA: Diagnosis present

## 2018-05-20 DIAGNOSIS — I444 Left anterior fascicular block: Secondary | ICD-10-CM | POA: Diagnosis present

## 2018-05-20 DIAGNOSIS — Z803 Family history of malignant neoplasm of breast: Secondary | ICD-10-CM

## 2018-05-20 DIAGNOSIS — N289 Disorder of kidney and ureter, unspecified: Secondary | ICD-10-CM

## 2018-05-20 DIAGNOSIS — I160 Hypertensive urgency: Secondary | ICD-10-CM | POA: Diagnosis present

## 2018-05-20 DIAGNOSIS — I130 Hypertensive heart and chronic kidney disease with heart failure and stage 1 through stage 4 chronic kidney disease, or unspecified chronic kidney disease: Secondary | ICD-10-CM | POA: Diagnosis present

## 2018-05-20 DIAGNOSIS — F17210 Nicotine dependence, cigarettes, uncomplicated: Secondary | ICD-10-CM | POA: Diagnosis present

## 2018-05-20 DIAGNOSIS — K851 Biliary acute pancreatitis without necrosis or infection: Principal | ICD-10-CM | POA: Diagnosis present

## 2018-05-20 DIAGNOSIS — Z419 Encounter for procedure for purposes other than remedying health state, unspecified: Secondary | ICD-10-CM

## 2018-05-20 DIAGNOSIS — I2584 Coronary atherosclerosis due to calcified coronary lesion: Secondary | ICD-10-CM | POA: Diagnosis present

## 2018-05-20 HISTORY — DX: Acute pancreatitis without necrosis or infection, unspecified: K85.90

## 2018-05-20 LAB — CBC
HEMATOCRIT: 52.3 % — AB (ref 39.0–52.0)
Hemoglobin: 17.5 g/dL — ABNORMAL HIGH (ref 13.0–17.0)
MCH: 31.4 pg (ref 26.0–34.0)
MCHC: 33.5 g/dL (ref 30.0–36.0)
MCV: 93.9 fL (ref 80.0–100.0)
Platelets: 347 10*3/uL (ref 150–400)
RBC: 5.57 MIL/uL (ref 4.22–5.81)
RDW: 13.2 % (ref 11.5–15.5)
WBC: 9.6 10*3/uL (ref 4.0–10.5)
nRBC: 0 % (ref 0.0–0.2)

## 2018-05-20 LAB — COMPREHENSIVE METABOLIC PANEL
ALT: 23 U/L (ref 0–44)
AST: 20 U/L (ref 15–41)
Albumin: 4.3 g/dL (ref 3.5–5.0)
Alkaline Phosphatase: 64 U/L (ref 38–126)
Anion gap: 9 (ref 5–15)
BILIRUBIN TOTAL: 1 mg/dL (ref 0.3–1.2)
BUN: 14 mg/dL (ref 6–20)
CO2: 32 mmol/L (ref 22–32)
CREATININE: 1.37 mg/dL — AB (ref 0.61–1.24)
Calcium: 12.1 mg/dL — ABNORMAL HIGH (ref 8.9–10.3)
Chloride: 95 mmol/L — ABNORMAL LOW (ref 98–111)
GFR calc Af Amer: >60 mL/min (ref >60)
GFR, EST NON AFRICAN AMERICAN: 59 mL/min — AB (ref >60)
GLUCOSE: 144 mg/dL — AB (ref 70–99)
Potassium: 3.8 mmol/L (ref 3.5–5.1)
Sodium: 136 mmol/L (ref 135–145)
Total Protein: 8.4 g/dL — ABNORMAL HIGH (ref 6.5–8.1)

## 2018-05-20 LAB — URINALYSIS, ROUTINE W REFLEX MICROSCOPIC
BACTERIA UA: NONE SEEN
Bilirubin Urine: NEGATIVE
Glucose, UA: NEGATIVE mg/dL
KETONES UR: NEGATIVE mg/dL
Leukocytes, UA: NEGATIVE
Nitrite: NEGATIVE
PROTEIN: 30 mg/dL — AB
Specific Gravity, Urine: 1.025 (ref 1.005–1.030)
pH: 6 (ref 5.0–8.0)

## 2018-05-20 LAB — I-STAT TROPONIN, ED: Troponin i, poc: 0.01 ng/mL (ref 0.00–0.08)

## 2018-05-20 LAB — LIPASE, BLOOD: Lipase: 278 U/L — ABNORMAL HIGH (ref 11–51)

## 2018-05-20 LAB — TRIGLYCERIDES: TRIGLYCERIDES: 174 mg/dL — AB (ref <150)

## 2018-05-20 MED ORDER — HYDROMORPHONE HCL 1 MG/ML IJ SOLN
1.0000 mg | Freq: Once | INTRAMUSCULAR | Status: AC
  Administered 2018-05-20: 1 mg via INTRAVENOUS
  Filled 2018-05-20: qty 1

## 2018-05-20 MED ORDER — IOHEXOL 300 MG/ML  SOLN
100.0000 mL | Freq: Once | INTRAMUSCULAR | Status: AC | PRN
  Administered 2018-05-20: 100 mL via INTRAVENOUS

## 2018-05-20 MED ORDER — LABETALOL HCL 5 MG/ML IV SOLN
10.0000 mg | INTRAVENOUS | Status: DC | PRN
  Administered 2018-05-20 – 2018-05-21 (×3): 10 mg via INTRAVENOUS
  Filled 2018-05-20 (×3): qty 4

## 2018-05-20 MED ORDER — NICOTINE POLACRILEX 2 MG MT GUM
2.0000 mg | CHEWING_GUM | OROMUCOSAL | Status: DC | PRN
  Filled 2018-05-20: qty 1

## 2018-05-20 MED ORDER — HYDROMORPHONE HCL 1 MG/ML IJ SOLN
1.0000 mg | INTRAMUSCULAR | Status: DC | PRN
  Administered 2018-05-20 – 2018-05-21 (×7): 1 mg via INTRAVENOUS
  Filled 2018-05-20 (×7): qty 1

## 2018-05-20 MED ORDER — NICOTINE 21 MG/24HR TD PT24
21.0000 mg | MEDICATED_PATCH | Freq: Every day | TRANSDERMAL | Status: DC
  Administered 2018-05-20 – 2018-05-23 (×3): 21 mg via TRANSDERMAL
  Filled 2018-05-20 (×3): qty 1

## 2018-05-20 MED ORDER — ONDANSETRON HCL 4 MG PO TABS: 4.0000 mg | ORAL_TABLET | Freq: Four times a day (QID) | ORAL | Status: DC | PRN

## 2018-05-20 MED ORDER — HYDRALAZINE HCL 20 MG/ML IJ SOLN
10.0000 mg | Freq: Once | INTRAMUSCULAR | Status: AC
  Administered 2018-05-20: 10 mg via INTRAVENOUS
  Filled 2018-05-20: qty 1

## 2018-05-20 MED ORDER — ENOXAPARIN SODIUM 40 MG/0.4ML ~~LOC~~ SOLN
40.0000 mg | SUBCUTANEOUS | Status: DC
  Administered 2018-05-21: 40 mg via SUBCUTANEOUS
  Filled 2018-05-20 (×2): qty 0.4

## 2018-05-20 MED ORDER — CARVEDILOL 12.5 MG PO TABS: 6.2500 mg | ORAL_TABLET | Freq: Two times a day (BID) | ORAL | Status: DC

## 2018-05-20 MED ORDER — LACTATED RINGERS IV SOLN
INTRAVENOUS | Status: AC
  Administered 2018-05-20 – 2018-05-21 (×4): via INTRAVENOUS

## 2018-05-20 MED ORDER — HYDRALAZINE HCL 20 MG/ML IJ SOLN: 10.0000 mg | INTRAMUSCULAR | Status: DC | PRN

## 2018-05-20 MED ORDER — CARVEDILOL 6.25 MG PO TABS
6.2500 mg | ORAL_TABLET | Freq: Two times a day (BID) | ORAL | Status: DC
  Administered 2018-05-20: 6.25 mg via ORAL
  Filled 2018-05-20: qty 1

## 2018-05-20 MED ORDER — ENOXAPARIN SODIUM 40 MG/0.4ML ~~LOC~~ SOLN
40.0000 mg | SUBCUTANEOUS | Status: DC
  Administered 2018-05-20: 40 mg via SUBCUTANEOUS
  Filled 2018-05-20: qty 0.4

## 2018-05-20 MED ORDER — AMLODIPINE BESYLATE 5 MG PO TABS
5.0000 mg | ORAL_TABLET | Freq: Every day | ORAL | Status: DC
  Administered 2018-05-20: 5 mg via ORAL

## 2018-05-20 MED ORDER — SODIUM CHLORIDE 0.9 % IV SOLN
Freq: Once | INTRAVENOUS | Status: AC
  Administered 2018-05-20: 12:00:00 via INTRAVENOUS

## 2018-05-20 MED ORDER — CHLORTHALIDONE 25 MG PO TABS: 12.5000 mg | ORAL_TABLET | Freq: Every day | ORAL | Status: DC

## 2018-05-20 MED ORDER — ONDANSETRON HCL 4 MG/2ML IJ SOLN
4.0000 mg | Freq: Once | INTRAMUSCULAR | Status: AC
  Administered 2018-05-20: 4 mg via INTRAVENOUS
  Filled 2018-05-20: qty 2

## 2018-05-20 MED ORDER — MORPHINE SULFATE (PF) 4 MG/ML IV SOLN
4.0000 mg | Freq: Once | INTRAVENOUS | Status: AC
  Administered 2018-05-20: 4 mg via INTRAVENOUS
  Filled 2018-05-20: qty 1

## 2018-05-20 MED ORDER — ONDANSETRON HCL 4 MG/2ML IJ SOLN: 4.0000 mg | Freq: Four times a day (QID) | INTRAMUSCULAR | Status: DC | PRN

## 2018-05-20 MED ORDER — SODIUM CHLORIDE 0.9 % IV BOLUS
500.0000 mL | Freq: Once | INTRAVENOUS | Status: AC
  Administered 2018-05-20: 500 mL via INTRAVENOUS
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
## 2018-05-20 NOTE — ED Notes (Signed)
Patient transported to Ultrasound 

## 2018-05-20 NOTE — ED Triage Notes (Signed)
C/o of upper abd pain that has been going on for the past week, along with vomiting. Denies diarrhea or fevers
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
## 2018-05-21 ENCOUNTER — Ambulatory Visit (HOSPITAL_COMMUNITY): Payer: Self-pay

## 2018-05-21 ENCOUNTER — Encounter (HOSPITAL_COMMUNITY): Payer: Self-pay | Admitting: General Surgery

## 2018-05-21 LAB — LIPID PANEL
CHOL/HDL RATIO: 5.1 ratio
Cholesterol: 175 mg/dL (ref 0–200)
HDL: 34 mg/dL — AB (ref >40)
LDL CALC: 107 mg/dL — AB (ref 0–99)
TRIGLYCERIDES: 171 mg/dL — AB (ref <150)
VLDL: 34 mg/dL (ref 0–40)

## 2018-05-21 LAB — URINALYSIS, ROUTINE W REFLEX MICROSCOPIC
Bacteria, UA: NONE SEEN
Bilirubin Urine: NEGATIVE
GLUCOSE, UA: NEGATIVE mg/dL
HGB URINE DIPSTICK: NEGATIVE
Ketones, ur: NEGATIVE mg/dL
Leukocytes, UA: NEGATIVE
NITRITE: NEGATIVE
PH: 5 (ref 5.0–8.0)
Protein, ur: 30 mg/dL — AB
SPECIFIC GRAVITY, URINE: 1.038 — AB (ref 1.005–1.030)

## 2018-05-21 LAB — COMPREHENSIVE METABOLIC PANEL
ALT: 14 U/L (ref 0–44)
AST: 14 U/L — ABNORMAL LOW (ref 15–41)
Albumin: 3.3 g/dL — ABNORMAL LOW (ref 3.5–5.0)
Alkaline Phosphatase: 48 U/L (ref 38–126)
Anion gap: 14 (ref 5–15)
BILIRUBIN TOTAL: 1 mg/dL (ref 0.3–1.2)
BUN: 15 mg/dL (ref 6–20)
CALCIUM: 10 mg/dL (ref 8.9–10.3)
CHLORIDE: 93 mmol/L — AB (ref 98–111)
CO2: 31 mmol/L (ref 22–32)
CREATININE: 1.4 mg/dL — AB (ref 0.61–1.24)
GFR, EST NON AFRICAN AMERICAN: 57 mL/min — AB (ref >60)
Glucose, Bld: 94 mg/dL (ref 70–99)
Potassium: 4.4 mmol/L (ref 3.5–5.1)
Sodium: 138 mmol/L (ref 135–145)
TOTAL PROTEIN: 6.6 g/dL (ref 6.5–8.1)

## 2018-05-21 LAB — CBC
HEMATOCRIT: 46.2 % (ref 39.0–52.0)
Hemoglobin: 14.8 g/dL (ref 13.0–17.0)
MCH: 31 pg (ref 26.0–34.0)
MCHC: 32 g/dL (ref 30.0–36.0)
MCV: 96.9 fL (ref 80.0–100.0)
NRBC: 0 % (ref 0.0–0.2)
PLATELETS: 288 10*3/uL (ref 150–400)
RBC: 4.77 MIL/uL (ref 4.22–5.81)
RDW: 13.5 % (ref 11.5–15.5)
WBC: 8.8 10*3/uL (ref 4.0–10.5)

## 2018-05-21 LAB — HEMOGLOBIN A1C
Hgb A1c MFr Bld: 5.6 % (ref 4.8–5.6)
Mean Plasma Glucose: 114.02 mg/dL

## 2018-05-21 LAB — HIV ANTIBODY (ROUTINE TESTING W REFLEX): HIV SCREEN 4TH GENERATION: NONREACTIVE

## 2018-05-21 LAB — LIPASE, BLOOD: LIPASE: 205 U/L — AB (ref 11–51)

## 2018-05-21 MED ORDER — CARVEDILOL 3.125 MG PO TABS
3.1250 mg | ORAL_TABLET | Freq: Two times a day (BID) | ORAL | Status: DC
  Administered 2018-05-21 – 2018-05-23 (×5): 3.125 mg via ORAL
  Filled 2018-05-21 (×5): qty 1

## 2018-05-21 MED ORDER — OXYCODONE HCL 5 MG PO TABS
5.0000 mg | ORAL_TABLET | ORAL | Status: DC | PRN
  Administered 2018-05-22: 5 mg via ORAL

## 2018-05-21 MED ORDER — AMLODIPINE BESYLATE 10 MG PO TABS
10.0000 mg | ORAL_TABLET | Freq: Every day | ORAL | Status: DC
  Administered 2018-05-21 – 2018-05-23 (×3): 10 mg via ORAL
  Filled 2018-05-21 (×3): qty 1

## 2018-05-21 NOTE — Progress Notes (Signed)
PROGRESS NOTE    Paul [name]  VVZ:[id number] DOB: 12/02/1965 DOA: 05/20/2018 PCP: [name], MD   Brief Narrative:  Paul [name] is Paul [name] 52 y.o. male with medical history significant of hypertension, appendectomy, removal of kidney cyst with epigastric pain for about Paul [name] week who presented with gallstone pancreatitis.   Assessment & Plan:   Active Problems:   Acute pancreatitis   Acute Pancreatitis: Given [place], likely gallstone.  Endorses some etoh as well, also possible etiology.  Pt also notably with hypercalcemia.  Normal bili and alk phos. Hemodynamically stable.  Persistent pain today on exam Continue NPO with sips/meds, consider clears this afternoon Dilaudid prn pain Antiemetics CT with evidence of pancreatitis with cystic lesion, likely pseudocyst (no abscess or necrosis identified) Discussed with surgery who will see pt 10/24.  [place] with possible cholecystitis, will hold off on abx as pt seems clinically stable.  F/u surgery recs. MIVF with LR RCRI 1-2 with evidence of CAD and high risk surgery, but pt has at least moderate functional capacity on my discussion with him, so he'll be ok for OR when ready.  Hypertension: significantly elevated in ED, but he appears to be asx from this.  Has been prescribed meds in past, but he "dislikes" medications. Amlodipine 10 mg Carvedilol decrease to 3.125 mg BID with mild brady this morning Will plan to start thiazide as well, but will wait until creatinine improves and stable Prn hydral/labetalol as needed EKG notable for LVH.  Will follow echo.  Initial troponin negative, no CP, will not trend  Dyslipidemia: will need statin on discharge  CKD: suspect this is more CKD instead of AKI as not changed with IVF.  UA without blood, some protein.  CT without mention of hydro, but abnormal renal lesions as noted below.  Elevated blood sugar: Hb a1c 5.6  Abnormal EKG: follow echo as noted above, continue to monitor  Complex Renal  Lesions: pt with multiple bilateral enlarging complex renal lesions.  Will need renal protocol MRI at some point.    Right Coronary Artery Calcification:  Progressive since 2013, would likely benefit from outpatient cardiology follow up.  Hypercalcemia: Possibly related to tums.  Improved today.  Follow PTH.      Tobacco abuse: encouraged cessation, follow  Etoh Use: pt drinks 1-4 beers/day, no hx of withdrawal, follow  DVT prophylaxis: lovenox Code Status: full  Family Communication: none at bedside Disposition Plan: continues to require inpatient given persistent abdominal pain and need for treatment of pancreatitis with eventual surgical evaluation and treatment   Consultants:   surgery  Procedures:   none  Antimicrobials:   none    Subjective: Persistent pain. Otherwise doing ok.  Objective: Vitals:   05/21/18 0400 05/21/18 0500 05/21/18 0600 05/21/18 0611  BP:    (!) 166/112  Pulse: (!) 53 (!) 57 62   Resp: 16 16 (!) 25   Temp:      TempSrc:      SpO2: 99% 99% 98%   Weight:  88.2 kg    Height:        Intake/Output Summary (Last 24 hours) at 05/21/2018 0800 Last data filed at 05/21/2018 0600 Gross per 24 hour  Intake 1155.43 ml  Output 500 ml  Net 655.43 ml   Filed Weights   05/20/18 1717 05/21/18 0500  Weight: 85.9 kg 88.2 kg    Examination:  General exam: Appears calm and comfortable  Respiratory system: Clear to auscultation. Respiratory effort normal. Cardiovascular system: S1 & S2  heard, RRR. Gastrointestinal system: Abdomen is nondistended, soft and TTP in the epigastric region. Central nervous system: Alert and oriented. No focal neurological deficits. Extremities: no LEE Skin: No rashes, lesions or ulcers Psychiatry: Judgement and insight appear normal. Mood & affect appropriate.     Data Reviewed: I have personally reviewed following labs and imaging studies  CBC: Recent Labs  Lab 05/20/18 0717 05/21/18 0419  WBC 9.6 8.8    HGB 17.5* 14.8  HCT 52.3* 46.2  MCV 93.9 96.9  PLT 347 203   Basic Metabolic Panel: Recent Labs  Lab 05/20/18 0717 05/21/18 0419  NA 136 138  K 3.8 4.4  CL 95* 93*  CO2 32 31  GLUCOSE 144* 94  BUN 14 15  CREATININE 1.37* 1.40*  CALCIUM 12.1* 10.0   GFR: Estimated Creatinine Clearance: 69.3 mL/min (Paul [name]) (by C-G formula based on SCr of 1.4 mg/dL (H)). Liver Function Tests: Recent Labs  Lab 05/20/18 0717 05/21/18 0419  AST 20 14*  ALT 23 14  ALKPHOS 64 48  BILITOT 1.0 1.0  PROT 8.4* 6.6  ALBUMIN 4.3 3.3*   Recent Labs  Lab 05/20/18 0717 05/21/18 0419  LIPASE 278* 205*   No results for input(s): AMMONIA in the last 168 hours. Coagulation Profile: No results for input(s): INR, PROTIME in the last 168 hours. Cardiac Enzymes: No results for input(s): CKTOTAL, CKMB, CKMBINDEX, TROPONINI in the last 168 hours. BNP (last 3 results) No results for input(s): PROBNP in the last 8760 hours. HbA1C: Recent Labs    05/20/18 2216  HGBA1C 5.6   CBG: No results for input(s): GLUCAP in the last 168 hours. Lipid Profile: Recent Labs    05/20/18 0717 05/21/18 0419  CHOL  --  175  HDL  --  34*  LDLCALC  --  107*  TRIG 174* 171*  CHOLHDL  --  5.1   Thyroid Function Tests: No results for input(s): TSH, T4TOTAL, FREET4, T3FREE, THYROIDAB in the last 72 hours. Anemia Panel: No results for input(s): VITAMINB12, FOLATE, FERRITIN, TIBC, IRON, RETICCTPCT in the last 72 hours. Sepsis Labs: No results for input(s): PROCALCITON, LATICACIDVEN in the last 168 hours.  No results found for this or any previous visit (from the past 240 hour(s)).       Radiology Studies: Ct Abdomen Pelvis W Contrast  Result Date: 05/20/2018 CLINICAL DATA:  Upper abdominal pain for the last week with vomiting. EXAM: CT ABDOMEN AND PELVIS WITH CONTRAST TECHNIQUE: Multidetector CT imaging of the abdomen and pelvis was performed using the standard protocol following bolus administration of  intravenous contrast. CONTRAST:  149m OMNIPAQUE IOHEXOL 300 MG/ML  SOLN COMPARISON:  01/31/2012 CT ABDOMEN FINDINGS: Lower chest: Scattered bulla in the lung bases. Right coronary artery atherosclerotic calcification noted on images 2 through 9 series 3. This appears to of increased notably compared to 2013. Thickened appearance of the interventricular septum raises the possibility of left ventricular hypertrophy. Hepatobiliary: Unremarkable Pancreas: Peripancreatic stranding particularly notable along the tail the pancreas. Bilobed cystic lesion along the pancreatic body measuring 1.6 by 0.8 by 0.7 cm, new compared to 01/31/2012. Spleen: Small spleen with complex collection along the posterior margin with calcified rim, compatible with residua from prior splenic injury. I am skeptical that this is Paul [name] pancreatic pseudocyst. Adrenals/Urinary Tract: Adrenal glands unremarkable. There are multiple complex exophytic renal lesions bilaterally. Differences in density between portal venous and delayed phase images are probably accountable by slice selection, but strictly speaking the enhancement characteristics of these lesions are not characterized  on today's exam given the lack of precontrast imaging. I observe for complex lesions on the right and 2 on the left. Although these lesions have been present for years, they have been slowly increasing in size. Chronic parenchymal calcification in the right mid upper kidney medially, image 32/3, stable and likely postinflammatory. Scarring in the left mid kidney anterolaterally, favoring partial nephrectomy, correlate with operative history. 2 mm nonobstructive stone in the left kidney lower pole, image 58/6. Stomach/Bowel: Unremarkable Vascular/Lymphatic: Aortoiliac atherosclerotic vascular disease. Reproductive: Unremarkable Other: No supplemental non-categorized findings. Musculoskeletal: Grade 1 degenerative retrolisthesis at L4-5, along with congenitally short pedicles,  causing prominent central narrowing of the thecal sac at L3-4 and L4-5 as well as considerable foraminal impingement bilaterally at L4-5 and L5-S1. IMPRESSION: 1. Peripancreatic stranding compatible with acute pancreatitis. This is most concentrated along the pancreatic tail. Small cystic lesion in the pancreatic body superiorly, probably Paul [name] small pseudocyst. No abscess or pancreatic necrosis identified. 2. Multiple bilateral slowly enlarging complex renal lesions. These are difficult to characterize for enhancement given the absence of precontrast imaging. I recommend renal protocol MRI with and without contrast for definitive characterization of these lesions. Specifically although these may represent slowly enlarging complex benign cysts, I cannot exclude renal neoplasm. 3. Notably progressive right coronary artery atherosclerotic calcification compared to the 2013 exams. Suspected left ventricular hypertrophy. Old splenic trauma with Paul [name] complex collection along the posterior margin of the spleen with calcified rim. 4. 2 mm nonobstructive left kidney lower pole calculus. 5.  Aortic Atherosclerosis (ICD10-I70.0). 6. Lumbar spondylosis, degenerative disc disease, and congenitally short pedicles cause considerable impingement at L3-4, L4-5, and L5-S1. Electronically Signed   By: Van Clines M.D.   On: 05/20/2018 11:40   US Abdomen Limited Ruq  Result Date: 05/20/2018 CLINICAL DATA:  52 year old male with acute abdominal pain for 1 week. EXAM: ULTRASOUND ABDOMEN LIMITED RIGHT UPPER QUADRANT COMPARISON:  05/20/2018 CT FINDINGS: Gallbladder: Small mobile gallstones are identified, the largest measuring 6 mm. UPPER limits normal gallbladder wall thickening noted measuring 3 mm. No pericholecystic fluid. Patient on pain medications and sonographic Murphy sign not assessed. Common bile duct: Diameter: 3 mm.  No intrahepatic or extrahepatic biliary dilatation. Liver: No focal lesion identified. Within normal  limits in parenchymal echogenicity. Portal vein is patent on color Doppler imaging with normal direction of blood flow towards the liver. Two UPPER pole RIGHT renal cysts are identified, the largest measuring 1.9 cm. IMPRESSION: 1. Cholelithiasis with UPPER limits of normal gallbladder wall thickening. This is equivocal for acute cholecystitis and consider nuclear medicine study as clinically indicated. 2. No biliary dilatation. 3. Benign RIGHT UPPER pole renal cysts. Electronically Signed   By: Margarette Canada M.D.   On: 05/20/2018 14:47        Scheduled Meds: . amLODipine  10 mg Oral Daily  . carvedilol  3.125 mg Oral BID WC  . enoxaparin (LOVENOX) injection  40 mg Subcutaneous Q24H  . nicotine  21 mg Transdermal Daily   Continuous Infusions: . lactated ringers 150 mL/hr at 05/21/18 0600     LOS: 1 day    Time spent: over 30 min    Fayrene Helper, MD [facility] Pager [phone]  If 7PM-7AM, please contact night-coverage [email or web address] Password [email or web address] 05/21/2018, 8:00 AM
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
## 2018-05-22 ENCOUNTER — Encounter (HOSPITAL_COMMUNITY): Payer: Self-pay | Admitting: Anesthesiology

## 2018-05-22 ENCOUNTER — Inpatient Hospital Stay (HOSPITAL_COMMUNITY): Payer: Self-pay | Admitting: Anesthesiology

## 2018-05-22 ENCOUNTER — Encounter (HOSPITAL_COMMUNITY)
Admission: EM | Disposition: A | Discharge status: Home / Self Care | Payer: Self-pay | Source: Home / Self Care | Attending: Family Medicine

## 2018-05-22 ENCOUNTER — Inpatient Hospital Stay (HOSPITAL_COMMUNITY): Payer: Self-pay

## 2018-05-22 DIAGNOSIS — I2584 Coronary atherosclerosis due to calcified coronary lesion: Secondary | ICD-10-CM

## 2018-05-22 DIAGNOSIS — I2510 Atherosclerotic heart disease of native coronary artery without angina pectoris: Secondary | ICD-10-CM

## 2018-05-22 DIAGNOSIS — I10 Essential (primary) hypertension: Secondary | ICD-10-CM

## 2018-05-22 DIAGNOSIS — I5030 Unspecified diastolic (congestive) heart failure: Secondary | ICD-10-CM

## 2018-05-22 DIAGNOSIS — Z720 Tobacco use: Secondary | ICD-10-CM

## 2018-05-22 DIAGNOSIS — E785 Hyperlipidemia, unspecified: Secondary | ICD-10-CM

## 2018-05-22 DIAGNOSIS — N289 Disorder of kidney and ureter, unspecified: Secondary | ICD-10-CM

## 2018-05-22 DIAGNOSIS — K851 Biliary acute pancreatitis without necrosis or infection: Principal | ICD-10-CM

## 2018-05-22 HISTORY — PX: CHOLECYSTECTOMY: SHX55

## 2018-05-22 LAB — CBC
HEMATOCRIT: 44 % (ref 39.0–52.0)
Hemoglobin: 14 g/dL (ref 13.0–17.0)
MCH: 30.6 pg (ref 26.0–34.0)
MCHC: 31.8 g/dL (ref 30.0–36.0)
MCV: 96.1 fL (ref 80.0–100.0)
NRBC: 0 % (ref 0.0–0.2)
Platelets: 289 10*3/uL (ref 150–400)
RBC: 4.58 MIL/uL (ref 4.22–5.81)
RDW: 13.1 % (ref 11.5–15.5)
WBC: 8 10*3/uL (ref 4.0–10.5)

## 2018-05-22 LAB — PTH, INTACT AND CALCIUM
Calcium, Total (PTH): 9.7 mg/dL (ref 8.7–10.2)
PTH: 19 pg/mL (ref 15–65)

## 2018-05-22 LAB — COMPREHENSIVE METABOLIC PANEL
ALBUMIN: 2.9 g/dL — AB (ref 3.5–5.0)
ALK PHOS: 46 U/L (ref 38–126)
ALT: 12 U/L (ref 0–44)
AST: 13 U/L — AB (ref 15–41)
Anion gap: 6 (ref 5–15)
BILIRUBIN TOTAL: 1 mg/dL (ref 0.3–1.2)
BUN: 10 mg/dL (ref 6–20)
CALCIUM: 9.2 mg/dL (ref 8.9–10.3)
CO2: 32 mmol/L (ref 22–32)
Chloride: 99 mmol/L (ref 98–111)
Creatinine, Ser: 1.28 mg/dL — ABNORMAL HIGH (ref 0.61–1.24)
GFR calc Af Amer: >60 mL/min (ref >60)
GFR calc non Af Amer: >60 mL/min (ref >60)
GLUCOSE: 94 mg/dL (ref 70–99)
Potassium: 3.7 mmol/L (ref 3.5–5.1)
Sodium: 137 mmol/L (ref 135–145)
TOTAL PROTEIN: 6.3 g/dL — AB (ref 6.5–8.1)

## 2018-05-22 LAB — ECHOCARDIOGRAM COMPLETE
Height: 72 in
Weight: 3116.8 oz

## 2018-05-22 LAB — MRSA PCR SCREENING: MRSA BY PCR: POSITIVE — AB

## 2018-05-22 LAB — LIPASE, BLOOD: LIPASE: 87 U/L — AB (ref 11–51)

## 2018-05-22 SURGERY — LAPAROSCOPIC CHOLECYSTECTOMY WITH INTRAOPERATIVE CHOLANGIOGRAM
Anesthesia: General | Site: Abdomen

## 2018-05-22 MED ORDER — LACTATED RINGERS IV SOLN
INTRAVENOUS | Status: DC
  Administered 2018-05-22 – 2018-05-23 (×2): via INTRAVENOUS

## 2018-05-22 MED ORDER — SODIUM CHLORIDE 0.9 % IV SOLN
INTRAVENOUS | Status: DC | PRN
  Administered 2018-05-22: 5 mL

## 2018-05-22 MED ORDER — MIDAZOLAM HCL 2 MG/2ML IJ SOLN
INTRAMUSCULAR | Status: AC
  Filled 2018-05-22: qty 2

## 2018-05-22 MED ORDER — LIDOCAINE 2% (20 MG/ML) 5 ML SYRINGE
INTRAMUSCULAR | Status: AC
  Filled 2018-05-22: qty 5

## 2018-05-22 MED ORDER — PROMETHAZINE HCL 25 MG/ML IJ SOLN: 6.2500 mg | INTRAMUSCULAR | Status: DC | PRN

## 2018-05-22 MED ORDER — LABETALOL HCL 5 MG/ML IV SOLN
INTRAVENOUS | Status: AC
  Filled 2018-05-22: qty 4

## 2018-05-22 MED ORDER — BUPIVACAINE-EPINEPHRINE (PF) 0.25% -1:200000 IJ SOLN
INTRAMUSCULAR | Status: AC
  Filled 2018-05-22: qty 30

## 2018-05-22 MED ORDER — HYDROMORPHONE HCL 1 MG/ML IJ SOLN: 0.2500 mg | INTRAMUSCULAR | Status: DC | PRN

## 2018-05-22 MED ORDER — SODIUM CHLORIDE 0.9 % IR SOLN
Status: DC | PRN
  Administered 2018-05-22: 1000 mL

## 2018-05-22 MED ORDER — LABETALOL HCL 5 MG/ML IV SOLN
INTRAVENOUS | Status: DC | PRN
  Administered 2018-05-22: 5 mg via INTRAVENOUS

## 2018-05-22 MED ORDER — FENTANYL CITRATE (PF) 250 MCG/5ML IJ SOLN
INTRAMUSCULAR | Status: AC
  Filled 2018-05-22: qty 5

## 2018-05-22 MED ORDER — CHLORTHALIDONE 25 MG PO TABS: 12.5000 mg | ORAL_TABLET | Freq: Every day | ORAL | Status: DC

## 2018-05-22 MED ORDER — MIDAZOLAM HCL 5 MG/5ML IJ SOLN
INTRAMUSCULAR | Status: DC | PRN
  Administered 2018-05-22: 2 mg via INTRAVENOUS

## 2018-05-22 MED ORDER — PROPOFOL 10 MG/ML IV BOLUS
INTRAVENOUS | Status: AC
  Filled 2018-05-22: qty 20

## 2018-05-22 MED ORDER — SUCCINYLCHOLINE CHLORIDE 200 MG/10ML IV SOSY
PREFILLED_SYRINGE | INTRAVENOUS | Status: AC
  Filled 2018-05-22: qty 10

## 2018-05-22 MED ORDER — PROPOFOL 10 MG/ML IV BOLUS
INTRAVENOUS | Status: DC | PRN
  Administered 2018-05-22: 200 mg via INTRAVENOUS

## 2018-05-22 MED ORDER — PHENYLEPHRINE 40 MCG/ML (10ML) SYRINGE FOR IV PUSH (FOR BLOOD PRESSURE SUPPORT)
PREFILLED_SYRINGE | INTRAVENOUS | Status: DC | PRN
  Administered 2018-05-22: 40 ug via INTRAVENOUS
  Administered 2018-05-22: 80 ug via INTRAVENOUS

## 2018-05-22 MED ORDER — DEXAMETHASONE SODIUM PHOSPHATE 4 MG/ML IJ SOLN
INTRAMUSCULAR | Status: DC | PRN
  Administered 2018-05-22: 8 mg via INTRAVENOUS

## 2018-05-22 MED ORDER — ONDANSETRON HCL 4 MG/2ML IJ SOLN
INTRAMUSCULAR | Status: DC | PRN
  Administered 2018-05-22: 4 mg via INTRAVENOUS

## 2018-05-22 MED ORDER — LACTATED RINGERS IV SOLN: INTRAVENOUS | Status: DC

## 2018-05-22 MED ORDER — BUPIVACAINE-EPINEPHRINE 0.25% -1:200000 IJ SOLN
INTRAMUSCULAR | Status: DC | PRN
  Administered 2018-05-22: 16 mL

## 2018-05-22 MED ORDER — OXYCODONE HCL 5 MG PO TABS
5.0000 mg | ORAL_TABLET | ORAL | Status: DC | PRN
  Administered 2018-05-22 – 2018-05-23 (×2): 5 mg via ORAL
  Filled 2018-05-22 (×3): qty 1

## 2018-05-22 MED ORDER — ALBUTEROL SULFATE HFA 108 (90 BASE) MCG/ACT IN AERS
INHALATION_SPRAY | RESPIRATORY_TRACT | Status: DC | PRN
  Administered 2018-05-22: 6 via RESPIRATORY_TRACT

## 2018-05-22 MED ORDER — LIDOCAINE HCL (CARDIAC) PF 100 MG/5ML IV SOSY
PREFILLED_SYRINGE | INTRAVENOUS | Status: DC | PRN
  Administered 2018-05-22: 40 mg via INTRAVENOUS

## 2018-05-22 MED ORDER — ROCURONIUM BROMIDE 100 MG/10ML IV SOLN
INTRAVENOUS | Status: DC | PRN
  Administered 2018-05-22: 50 mg via INTRAVENOUS

## 2018-05-22 MED ORDER — ALBUTEROL SULFATE HFA 108 (90 BASE) MCG/ACT IN AERS
INHALATION_SPRAY | RESPIRATORY_TRACT | Status: AC
  Filled 2018-05-22: qty 6.7

## 2018-05-22 MED ORDER — MUPIROCIN 2 % EX OINT
1.0000 "application " | TOPICAL_OINTMENT | Freq: Two times a day (BID) | CUTANEOUS | Status: DC
  Administered 2018-05-23: 1 via NASAL
  Filled 2018-05-22: qty 22

## 2018-05-22 MED ORDER — ROCURONIUM BROMIDE 50 MG/5ML IV SOSY
PREFILLED_SYRINGE | INTRAVENOUS | Status: AC
  Filled 2018-05-22: qty 5

## 2018-05-22 MED ORDER — CEFAZOLIN SODIUM 1 G IJ SOLR
INTRAMUSCULAR | Status: DC | PRN
  Administered 2018-05-22: 2 g via INTRAMUSCULAR

## 2018-05-22 MED ORDER — MEPERIDINE HCL 50 MG/ML IJ SOLN: 6.2500 mg | INTRAMUSCULAR | Status: DC | PRN

## 2018-05-22 MED ORDER — ONDANSETRON HCL 4 MG/2ML IJ SOLN
INTRAMUSCULAR | Status: AC
  Filled 2018-05-22: qty 2

## 2018-05-22 MED ORDER — CHLORHEXIDINE GLUCONATE CLOTH 2 % EX PADS
6.0000 | MEDICATED_PAD | Freq: Every day | CUTANEOUS | Status: DC
  Administered 2018-05-23: 6 via TOPICAL

## 2018-05-22 MED ORDER — HYDROMORPHONE HCL 1 MG/ML IJ SOLN: 0.5000 mg | INTRAMUSCULAR | Status: DC | PRN

## 2018-05-22 MED ORDER — IOPAMIDOL (ISOVUE-300) INJECTION 61%
INTRAVENOUS | Status: AC
  Filled 2018-05-22: qty 50

## 2018-05-22 MED ORDER — SUGAMMADEX SODIUM 200 MG/2ML IV SOLN
INTRAVENOUS | Status: DC | PRN
  Administered 2018-05-22: 200 mg via INTRAVENOUS

## 2018-05-22 MED ORDER — 0.9 % SODIUM CHLORIDE (POUR BTL) OPTIME
TOPICAL | Status: DC | PRN
  Administered 2018-05-22: 1000 mL

## 2018-05-22 MED ORDER — LACTATED RINGERS IV SOLN
INTRAVENOUS | Status: DC
  Administered 2018-05-22: 13:00:00 via INTRAVENOUS

## 2018-05-22 MED ORDER — FENTANYL CITRATE (PF) 100 MCG/2ML IJ SOLN
INTRAMUSCULAR | Status: DC | PRN
  Administered 2018-05-22 (×4): 50 ug via INTRAVENOUS
  Administered 2018-05-22: 100 ug via INTRAVENOUS

## 2018-05-22 MED ORDER — HYDRALAZINE HCL 10 MG PO TABS
10.0000 mg | ORAL_TABLET | Freq: Three times a day (TID) | ORAL | Status: DC
  Administered 2018-05-22 – 2018-05-23 (×3): 10 mg via ORAL
  Filled 2018-05-22 (×3): qty 1

## 2018-05-22 MED ORDER — DEXAMETHASONE SODIUM PHOSPHATE 10 MG/ML IJ SOLN
INTRAMUSCULAR | Status: AC
  Filled 2018-05-22: qty 1

## 2018-05-22 SURGICAL SUPPLY — 40 items
APPLIER CLIP 5 13 M/L LIGAMAX5 (MISCELLANEOUS)
BLADE CLIPPER SURG (BLADE) ×3 IMPLANT
CANISTER SUCT 3000ML PPV (MISCELLANEOUS) ×3 IMPLANT
CHLORAPREP W/TINT 26ML (MISCELLANEOUS) ×3 IMPLANT
CLIP APPLIE 5 13 M/L LIGAMAX5 (MISCELLANEOUS) IMPLANT
CLOSURE WOUND 1/2 X4 (GAUZE/BANDAGES/DRESSINGS) ×1
COVER MAYO STAND STRL (DRAPES) IMPLANT
COVER SURGICAL LIGHT HANDLE (MISCELLANEOUS) ×3 IMPLANT
COVER WAND RF STERILE (DRAPES) ×3 IMPLANT
DERMABOND ADVANCED (GAUZE/BANDAGES/DRESSINGS) ×2
DERMABOND ADVANCED .7 DNX12 (GAUZE/BANDAGES/DRESSINGS) ×1 IMPLANT
DRAPE C-ARM 42X72 X-RAY (DRAPES) ×3 IMPLANT
DRSG TEGADERM 2-3/8X2-3/4 SM (GAUZE/BANDAGES/DRESSINGS) ×3 IMPLANT
ELECT REM PT RETURN 9FT ADLT (ELECTROSURGICAL) ×3
ELECTRODE REM PT RTRN 9FT ADLT (ELECTROSURGICAL) ×1 IMPLANT
GLOVE BIOGEL PI IND STRL 8 (GLOVE) ×1 IMPLANT
GLOVE BIOGEL PI INDICATOR 8 (GLOVE) ×2
GLOVE ECLIPSE 7.5 STRL STRAW (GLOVE) ×3 IMPLANT
GOWN STRL REUS W/ TWL LRG LVL3 (GOWN DISPOSABLE) ×3 IMPLANT
GOWN STRL REUS W/TWL LRG LVL3 (GOWN DISPOSABLE) ×6
KIT BASIN OR (CUSTOM PROCEDURE TRAY) ×3 IMPLANT
KIT TURNOVER KIT B (KITS) ×3 IMPLANT
NS IRRIG 1000ML POUR BTL (IV SOLUTION) ×3 IMPLANT
PAD ARMBOARD 7.5X6 YLW CONV (MISCELLANEOUS) ×3 IMPLANT
POUCH RETRIEVAL ECOSAC 10 (ENDOMECHANICALS) ×1 IMPLANT
POUCH RETRIEVAL ECOSAC 10MM (ENDOMECHANICALS) ×2
SCISSORS LAP 5X35 DISP (ENDOMECHANICALS) ×3 IMPLANT
SET CHOLANGIOGRAPH 5 50 .035 (SET/KITS/TRAYS/PACK) ×3 IMPLANT
SET IRRIG TUBING LAPAROSCOPIC (IRRIGATION / IRRIGATOR) ×3 IMPLANT
SLEEVE ENDOPATH XCEL 5M (ENDOMECHANICALS) ×6 IMPLANT
SPECIMEN JAR SMALL (MISCELLANEOUS) ×3 IMPLANT
STRIP CLOSURE SKIN 1/2X4 (GAUZE/BANDAGES/DRESSINGS) ×2 IMPLANT
SUT MNCRL AB 4-0 PS2 18 (SUTURE) ×3 IMPLANT
TOWEL OR 17X24 6PK STRL BLUE (TOWEL DISPOSABLE) ×3 IMPLANT
TOWEL OR 17X26 10 PK STRL BLUE (TOWEL DISPOSABLE) ×3 IMPLANT
TRAY LAPAROSCOPIC MC (CUSTOM PROCEDURE TRAY) ×3 IMPLANT
TROCAR XCEL BLUNT TIP 100MML (ENDOMECHANICALS) ×3 IMPLANT
TROCAR XCEL NON-BLD 5MMX100MML (ENDOMECHANICALS) ×3 IMPLANT
TUBING INSUFFLATION (TUBING) ×3 IMPLANT
WATER STERILE IRR 1000ML POUR (IV SOLUTION) ×3 IMPLANT

## 2018-05-22 NOTE — Progress Notes (Signed)
Per surgical PA. Pt not going for surgery today, ok to have clear liquids.

## 2018-05-22 NOTE — Op Note (Addendum)
OPERATIVE REPORT  DATE OF OPERATION: 05/22/2018  PATIENT:  Paul [name]  52 y.o. male  PRE-OPERATIVE DIAGNOSIS:  Gallstone pancreatitis  POST-OPERATIVE DIAGNOSIS:  Gallstone pancreatitis  INDICATION(S) FOR OPERATION:  Gallstone pancreatitis  FINDINGS:  Chronic cholecystitis, Normal IOC  PROCEDURE:  Procedure(s): LAPAROSCOPIC CHOLECYSTECTOMY WITH INTRAOPERATIVE CHOLANGIOGRAM  SURGEON:  Surgeon(s): Judeth Horn, MD  ASSISTANT: None  ANESTHESIA:   general  COMPLICATIONS:  None  EBL: 10 ml  BLOOD ADMINISTERED: none  DRAINS: none   SPECIMEN:  Source of Specimen:  Gallbladder and contents  COUNTS CORRECT:  YES  PROCEDURE DETAILS: The patient was taken to the operating room and placed on the table in the supine position.  After an adequate endotracheal anesthetic was administered, the patient was prepped with ChloroPrep, and then draped in the usual manner exposing the entire abdomen laterally, inferiorly and up  to the costal margins.  After a proper timeout was performed including identifying the patient and the procedure to be performed, a supraumbilical 2.4MQ midline incision was made using a #15 blade.  This was taken down to the fascia which was then incised with a #15 blade.  The edges of the fascia were tented up with Kocher clamps as the preperitoneal space was penetrated with a Kelly clamp into the peritoneum.  Once this was done, a pursestring suture of 0 Vicryl was passed around the fascial opening.  This was subsequently used to secure the [facility] cannula which was passed into the peritoneal cavity.  Once the [facility] cannula was in place, carbon dioxide gas was insufflated into the peritoneal cavity up to a maximal intra-abdominal pressure of 19mm Hg.The laparoscope, with attached camera and light source, was passed into the peritoneal cavity to visualize the direct insertion of two right upper quadrant 73mm cannulas, and a sup-xiphoid 71mm cannula.  Once all cannulas were in  place, the dissection was begun.  Two ratcheted graspers were attached to the dome and infundibulum of the gallbladder and retracted towards the anterior abdominal wall and the right upper quadrant.  Using cautery attached to a dissecting forceps, the peritoneum overlaying the triangle of Chalot and the hepatoduodenal triangle was dissected away exposing the cystic duct and the cystic artery.  A critical window was developed between the CBD and the cystic duct The cystic artery was clipped proximally and distally then transected.  A clip was placed on the gallbladder side of the cystic duct, then a cholecystodochotomy made using the laparoscopic scissors.  Through the cholecystodochotomy a Cook catheter was passed to performed a cholangiogram.  The cholangiogram showed good flow into the duodenum, good proximal filling, no intraductal filling defects, and no dilatation..  Once the cholangiogram was completed, the [facility] catheter was removed, and the distal cystic duct was clipped multiple times then transected between the clips.  The gallbladder was then dissected out of the hepatic bed without event.  It was retrieved from the abdomen (using an EndoCatch bag) without event.  Once the gallbladder was removed, the bed was inspected for hemostasis.  Once excellent hemostasis was obtained all gas and fluids were aspirated from above the liver, then the cannulas were removed.  The supraumbilical incision was closed using the pursestring suture which was in place.  0.25% bupivicaine with epinephrine was injected at all sites.  All 72mm or greater cannula sites were close using a running subcuticular stitch of 4-0 Monocryl.  5.47mm cannula sites were closed with Dermabond only.Steri-Strips and Tagaderm were used to complete the dressings at all sites.  At this point all needle, sponge, and instrument counts were correct.The patient was awakened from anesthesia and taken to the PACU in stable  condition.      Kathryne Eriksson. Dahlia Bailiff, MD, [place] [phone] [facility]  PATIENT DISPOSITION:  PACU - hemodynamically stable.   Judeth Horn 10/26/20193:27 PM

## 2018-05-22 NOTE — Progress Notes (Signed)
  Echocardiogram 2D Echocardiogram has been performed.  Matilde Bash 05/22/2018, 9:22 AM

## 2018-05-22 NOTE — Progress Notes (Signed)
Anesthesia requesting Echo results prior to patient going to surgery. Spoke to Dr. Florene Glen regarding same. He states he will speak to cardiology.

## 2018-05-22 NOTE — Anesthesia Procedure Notes (Addendum)
Procedure Name: Intubation Date/Time: 05/22/2018 2:15 PM Performed by: Sammie Bench, CRNA Pre-anesthesia Checklist: Patient identified, Emergency Drugs available, Suction available and Patient being monitored Patient Re-evaluated:Patient Re-evaluated prior to induction Oxygen Delivery Method: Circle System Utilized Preoxygenation: Pre-oxygenation with 100% oxygen Induction Type: IV induction Ventilation: Mask ventilation without difficulty Laryngoscope Size: Mac and 4 Grade View: Grade I Tube type: Oral Tube size: 8.0 mm Number of attempts: 1 Airway Equipment and Method: Stylet Placement Confirmation: ETT inserted through vocal cords under direct vision,  positive ETCO2 and breath sounds checked- equal and bilateral Secured at: 22 cm Tube secured with: Tape Dental Injury: Teeth and Oropharynx as per pre-operative assessment

## 2018-05-22 NOTE — Progress Notes (Signed)
PROGRESS NOTE    Paul [name]  BDZ:[id number] DOB: 1966-01-15 DOA: 05/20/2018 PCP: [name], MD   Brief Narrative:  Paul [name] is a 52 y.o. male with medical history significant of hypertension, appendectomy, removal of kidney cyst with epigastric pain for about a week who presented with gallstone pancreatitis.  Assessment & Plan:   Active Problems:   Acute pancreatitis   Acute Pancreatitis: Given [place], likely gallstone.  Endorses some etoh as well, also possible etiology.  Pt also notably with hypercalcemia.  Normal bili and alk phos. Hemodynamically stable.  Pain is resolved at this time Continue NPO with sips/meds (pt noted surgery said maybe OR today if he's doing well?), if no plans for surgery, will advance diet Dilaudid/oxycodone Antiemetics CT with evidence of pancreatitis with cystic lesion, likely pseudocyst (no abscess or necrosis identified) Appreciate surgery recs  MIVF with LR RCRI 1-2 with evidence of CAD and high risk surgery, but pt has at least moderate functional capacity on my discussion with him, so he'll be ok for OR when ready.  Hypertension: significantly elevated in ED, but he appears to be asx from this.  Has been prescribed meds in past, but he "dislikes" medications. BP still elevated Amlodipine 10 mg Carvedilol 3.125 mg BID  Start PO hydralazine Would like to start thiazide, but will wait until creatinine stablizes Prn hydral/labetalol as needed EKG notable for LVH.  Will follow echo.  Initial troponin negative, no CP, will not trend  Dyslipidemia: will need statin on discharge  CKD: suspect this is more CKD instead of AKI as not changed with IVF.  UA without blood, some protein.  CT without mention of hydro, but abnormal renal lesions as noted below.  Creatinine mildly improved today, follow.  Elevated blood sugar: Hb a1c 5.6  Abnormal EKG: follow echo as noted above, continue to monitor  Complex Renal Lesions: pt with multiple  bilateral enlarging complex renal lesions.  Will need renal protocol MRI at some point.    Right Coronary Artery Calcification:  Progressive since 2013, would likely benefit from outpatient cardiology follow up.  Hypercalcemia: Possibly related to tums.  Improved today.  Follow PTH.      Tobacco abuse: encouraged cessation, follow  Etoh Use: pt drinks 1-4 beers/day, no hx of withdrawal, follow  DVT prophylaxis: lovenox Code Status: full  Family Communication: none at bedside Disposition Plan: continues to require inpatient given persistent abdominal pain and need for treatment of pancreatitis with eventual surgical evaluation and treatment   Consultants:   surgery  Procedures:   none  Antimicrobials:   none    Subjective: Pain has resolved. Asking to take a shower. Says they might do surgery today based on his conversation from yesterday.  Objective: Vitals:   05/21/18 2057 05/21/18 2120 05/22/18 0409 05/22/18 0500  BP: (!) 161/89  (!) 166/108   Pulse: 69  62   Resp: 20  18   Temp:  98.3 F (36.8 C) 97.9 F (36.6 C)   TempSrc:  Oral Oral   SpO2: 96%  98%   Weight:    88.4 kg  Height:        Intake/Output Summary (Last 24 hours) at 05/22/2018 0804 Last data filed at 05/22/2018 0700 Gross per 24 hour  Intake 2829.83 ml  Output 1800 ml  Net 1029.83 ml   Filed Weights   05/20/18 1717 05/21/18 0500 05/22/18 0500  Weight: 85.9 kg 88.2 kg 88.4 kg    Examination:  General: No acute distress. Cardiovascular:  Heart sounds show a regular rate, and rhythm.  Lungs: Clear to auscultation bilaterally  Abdomen: Soft, nontender, nondistended Neurological: Alert and oriented 3. Moves all extremities 4. Cranial nerves II through XII grossly intact. Skin: Warm and dry. No rashes or lesions. Extremities: No clubbing or cyanosis. No edema. Pedal pulses 2+. Psychiatric: Mood and affect are normal. Insight and judgment are appropriate.  Data Reviewed: I have  personally reviewed following labs and imaging studies  CBC: Recent Labs  Lab 05/20/18 0717 05/21/18 0419 05/22/18 0440  WBC 9.6 8.8 8.0  HGB 17.5* 14.8 14.0  HCT 52.3* 46.2 44.0  MCV 93.9 96.9 96.1  PLT 347 288 564   Basic Metabolic Panel: Recent Labs  Lab 05/20/18 0717 05/21/18 0419 05/22/18 0440  NA 136 138 137  K 3.8 4.4 3.7  CL 95* 93* 99  CO2 32 31 32  GLUCOSE 144* 94 94  BUN _0 CREATININE 1.37* 1.40* 1.28*  CALCIUM 12.1* 10.0 9.2   GFR: Estimated Creatinine Clearance: 75.8 mL/min (A) (by C-G formula based on SCr of 1.28 mg/dL (H)). Liver Function Tests: Recent Labs  Lab 05/20/18 0717 05/21/18 0419 05/22/18 0440  AST 20 14* 13*  ALT _1 ALKPHOS 64 48 46  BILITOT 1.0 1.0 1.0  PROT 8.4* 6.6 6.3*  ALBUMIN 4.3 3.3* 2.9*   Recent Labs  Lab 05/20/18 0717 05/21/18 0419 05/22/18 0440  LIPASE 278* 205* 87*   No results for input(s): AMMONIA in the last 168 hours. Coagulation Profile: No results for input(s): INR, PROTIME in the last 168 hours. Cardiac Enzymes: No results for input(s): CKTOTAL, CKMB, CKMBINDEX, TROPONINI in the last 168 hours. BNP (last 3 results) No results for input(s): PROBNP in the last 8760 hours. HbA1C: Recent Labs    05/20/18 2216  HGBA1C 5.6   CBG: No results for input(s): GLUCAP in the last 168 hours. Lipid Profile: Recent Labs    05/20/18 0717 05/21/18 0419  CHOL  --  175  HDL  --  34*  LDLCALC  --  107*  TRIG 174* 171*  CHOLHDL  --  5.1   Thyroid Function Tests: No results for input(s): TSH, T4TOTAL, FREET4, T3FREE, THYROIDAB in the last 72 hours. Anemia Panel: No results for input(s): VITAMINB12, FOLATE, FERRITIN, TIBC, IRON, RETICCTPCT in the last 72 hours. Sepsis Labs: No results for input(s): PROCALCITON, LATICACIDVEN in the last 168 hours.  No results found for this or any previous visit (from the past 240 hour(s)).       Radiology Studies: Ct Abdomen Pelvis W Contrast  Result Date:  05/20/2018 CLINICAL DATA:  Upper abdominal pain for the last week with vomiting. EXAM: CT ABDOMEN AND PELVIS WITH CONTRAST TECHNIQUE: Multidetector CT imaging of the abdomen and pelvis was performed using the standard protocol following bolus administration of intravenous contrast. CONTRAST:  128m OMNIPAQUE IOHEXOL 300 MG/ML  SOLN COMPARISON:  01/31/2012 CT ABDOMEN FINDINGS: Lower chest: Scattered bulla in the lung bases. Right coronary artery atherosclerotic calcification noted on images 2 through 9 series 3. This appears to of increased notably compared to 2013. Thickened appearance of the interventricular septum raises the possibility of left ventricular hypertrophy. Hepatobiliary: Unremarkable Pancreas: Peripancreatic stranding particularly notable along the tail the pancreas. Bilobed cystic lesion along the pancreatic body measuring 1.6 by 0.8 by 0.7 cm, new compared to 01/31/2012. Spleen: Small spleen with complex collection along the posterior margin with calcified rim, compatible with residua from prior splenic injury. I am skeptical that this  is a pancreatic pseudocyst. Adrenals/Urinary Tract: Adrenal glands unremarkable. There are multiple complex exophytic renal lesions bilaterally. Differences in density between portal venous and delayed phase images are probably accountable by slice selection, but strictly speaking the enhancement characteristics of these lesions are not characterized on today's exam given the lack of precontrast imaging. I observe for complex lesions on the right and 2 on the left. Although these lesions have been present for years, they have been slowly increasing in size. Chronic parenchymal calcification in the right mid upper kidney medially, image 32/3, stable and likely postinflammatory. Scarring in the left mid kidney anterolaterally, favoring partial nephrectomy, correlate with operative history. 2 mm nonobstructive stone in the left kidney lower pole, image 58/6.  Stomach/Bowel: Unremarkable Vascular/Lymphatic: Aortoiliac atherosclerotic vascular disease. Reproductive: Unremarkable Other: No supplemental non-categorized findings. Musculoskeletal: Grade 1 degenerative retrolisthesis at L4-5, along with congenitally short pedicles, causing prominent central narrowing of the thecal sac at L3-4 and L4-5 as well as considerable foraminal impingement bilaterally at L4-5 and L5-S1. IMPRESSION: 1. Peripancreatic stranding compatible with acute pancreatitis. This is most concentrated along the pancreatic tail. Small cystic lesion in the pancreatic body superiorly, probably a small pseudocyst. No abscess or pancreatic necrosis identified. 2. Multiple bilateral slowly enlarging complex renal lesions. These are difficult to characterize for enhancement given the absence of precontrast imaging. I recommend renal protocol MRI with and without contrast for definitive characterization of these lesions. Specifically although these may represent slowly enlarging complex benign cysts, I cannot exclude renal neoplasm. 3. Notably progressive right coronary artery atherosclerotic calcification compared to the 2013 exams. Suspected left ventricular hypertrophy. Old splenic trauma with a complex collection along the posterior margin of the spleen with calcified rim. 4. 2 mm nonobstructive left kidney lower pole calculus. 5.  Aortic Atherosclerosis (ICD10-I70.0). 6. Lumbar spondylosis, degenerative disc disease, and congenitally short pedicles cause considerable impingement at L3-4, L4-5, and L5-S1. Electronically Signed   By: Van Clines M.D.   On: 05/20/2018 11:40   US Abdomen Limited Ruq  Result Date: 05/20/2018 CLINICAL DATA:  52 year old male with acute abdominal pain for 1 week. EXAM: ULTRASOUND ABDOMEN LIMITED RIGHT UPPER QUADRANT COMPARISON:  05/20/2018 CT FINDINGS: Gallbladder: Small mobile gallstones are identified, the largest measuring 6 mm. UPPER limits normal gallbladder  wall thickening noted measuring 3 mm. No pericholecystic fluid. Patient on pain medications and sonographic Murphy sign not assessed. Common bile duct: Diameter: 3 mm.  No intrahepatic or extrahepatic biliary dilatation. Liver: No focal lesion identified. Within normal limits in parenchymal echogenicity. Portal vein is patent on color Doppler imaging with normal direction of blood flow towards the liver. Two UPPER pole RIGHT renal cysts are identified, the largest measuring 1.9 cm. IMPRESSION: 1. Cholelithiasis with UPPER limits of normal gallbladder wall thickening. This is equivocal for acute cholecystitis and consider nuclear medicine study as clinically indicated. 2. No biliary dilatation. 3. Benign RIGHT UPPER pole renal cysts. Electronically Signed   By: Margarette Canada M.D.   On: 05/20/2018 14:47        Scheduled Meds: . amLODipine  10 mg Oral Daily  . carvedilol  3.125 mg Oral BID WC  . enoxaparin (LOVENOX) injection  40 mg Subcutaneous Q24H  . nicotine  21 mg Transdermal Daily   Continuous Infusions: . lactated ringers       LOS: 2 days    Time spent: over 30 min    Fayrene Helper, MD [facility] Pager [phone]  If 7PM-7AM, please contact night-coverage [email or web address] Password [name] 05/22/2018, 8:04  AM

## 2018-05-22 NOTE — Anesthesia Preprocedure Evaluation (Addendum)
Anesthesia Evaluation  Patient identified by MRN, date of birth, ID band Patient awake    Reviewed: Allergy & Precautions, NPO status , Patient's Chart, lab work & pertinent test results  Airway Mallampati: I  TM Distance: >3 FB Neck ROM: Full    Dental  (+) Poor Dentition, Chipped, Dental Advisory Given,    Pulmonary Current Smoker,    breath sounds clear to auscultation       Cardiovascular hypertension, + CAD   Rhythm:Regular Rate:Normal     Neuro/Psych negative neurological ROS     GI/Hepatic negative GI ROS, Neg liver ROS,   Endo/Other  negative endocrine ROS  Renal/GU Renal InsufficiencyRenal disease     Musculoskeletal negative musculoskeletal ROS (+)   Abdominal Normal abdominal exam  (+)   Peds  Hematology negative hematology ROS (+)   Anesthesia Other Findings   Reproductive/Obstetrics                            Lab Results  Component Value Date   WBC 8.0 05/22/2018   HGB 14.0 05/22/2018   HCT 44.0 05/22/2018   MCV 96.1 05/22/2018   PLT 289 05/22/2018   Lab Results  Component Value Date   CREATININE 1.28 (H) 05/22/2018   BUN 10 05/22/2018   NA 137 05/22/2018   K 3.7 05/22/2018   CL 99 05/22/2018   CO2 32 05/22/2018   Lab Results  Component Value Date   INR 0.95 01/31/2012   EKG: normal sinus rhythm.  Echo: - Left ventricle: The cavity size was normal. Wall thickness was   increased in a pattern of mild LVH. Systolic function was normal.   The estimated ejection fraction was in the range of 60% to 65%.   Wall motion was normal; there were no regional wall motion   abnormalities. Doppler parameters are consistent with a   reversible restrictive pattern, indicative of decreased left   ventricular diastolic compliance and/or increased left atrial   pressure (grade 3 diastolic dysfunction). - Right atrium: The atrium was mildly dilated.  Anesthesia  Physical Anesthesia Plan  ASA: II  Anesthesia Plan: General   Post-op Pain Management:    Induction: Intravenous  PONV Risk Score and Plan: 2 and Ondansetron, Dexamethasone and Midazolam  Airway Management Planned: Oral ETT  Additional Equipment: None  Intra-op Plan:   Post-operative Plan: Extubation in OR  Informed Consent: I have reviewed the patients History and Physical, chart, labs and discussed the procedure including the risks, benefits and alternatives for the proposed anesthesia with the patient or authorized representative who has indicated his/her understanding and acceptance.   Dental advisory given  Plan Discussed with: CRNA  Anesthesia Plan Comments: (Pt denies active cardiac symptoms. Normal TTE and exercise tolerance, will proceed with surgery. )       Anesthesia Quick Evaluation

## 2018-05-22 NOTE — Progress Notes (Signed)
[facility] Progress Note     Subjective: CC:  Denies pain. Denies fever, chills, nausea, or vomiting.  Objective: Vital signs in last 24 hours: Temp:  [97.9 F (36.6 C)-98.7 F (37.1 C)] 97.9 F (36.6 C) (10/26 0409) Pulse Rate:  [61-73] 62 (10/26 0409) Resp:  [15-21] 18 (10/26 0409) BP: (161-196)/(89-115) 166/108 (10/26 0409) SpO2:  [96 %-98 %] 98 % (10/26 0409) Weight:  [88.4 kg] 88.4 kg (10/26 0500) Last BM Date: (PTA)  Intake/Output from previous day: 10/25 0701 - 10/26 0700 In: 2829.8 [I.V.:2829.8] Out: 1800 [Urine:1800] Intake/Output this shift: Total I/O In: -  Out: 400 [Urine:400]  PE: Gen:  Alert, NAD, pleasant Card:  Regular rate and rhythm, pedal pulses 2+ BL Pulm:  Normal effort, clear to auscultation bilaterally Abd: Soft, non-tender, mild distention, bowel sounds present Skin: warm and dry, no rashes  Psych: A&Ox3   Lab Results:  Recent Labs    05/21/18 0419 05/22/18 0440  WBC 8.8 8.0  HGB 14.8 14.0  HCT 46.2 44.0  PLT 288 289   BMET Recent Labs    05/21/18 0419 05/22/18 0440  NA 138 137  K 4.4 3.7  CL 93* 99  CO2 31 32  GLUCOSE 94 94  BUN 15 10  CREATININE 1.40* 1.28*  CALCIUM 10.0 9.2   PT/INR No results for input(s): LABPROT, INR in the last 72 hours. CMP     Component Value Date/Time   NA 137 05/22/2018 0440   K 3.7 05/22/2018 0440   CL 99 05/22/2018 0440   CO2 32 05/22/2018 0440   GLUCOSE 94 05/22/2018 0440   BUN 10 05/22/2018 0440   CREATININE 1.28 (H) 05/22/2018 0440   CALCIUM 9.2 05/22/2018 0440   PROT 6.3 (L) 05/22/2018 0440   ALBUMIN 2.9 (L) 05/22/2018 0440   AST 13 (L) 05/22/2018 0440   ALT 12 05/22/2018 0440   ALKPHOS 46 05/22/2018 0440   BILITOT 1.0 05/22/2018 0440   GFRNONAA >60 05/22/2018 0440   GFRAA >60 05/22/2018 0440   Lipase     Component Value Date/Time   LIPASE 87 (H) 05/22/2018 0440       Studies/Results: Ct Abdomen Pelvis W Contrast  Result Date: 05/20/2018 CLINICAL  DATA:  Upper abdominal pain for the last week with vomiting. EXAM: CT ABDOMEN AND PELVIS WITH CONTRAST TECHNIQUE: Multidetector CT imaging of the abdomen and pelvis was performed using the standard protocol following bolus administration of intravenous contrast. CONTRAST:  172mL OMNIPAQUE IOHEXOL 300 MG/ML  SOLN COMPARISON:  01/31/2012 CT ABDOMEN FINDINGS: Lower chest: Scattered bulla in the lung bases. Right coronary artery atherosclerotic calcification noted on images 2 through 9 series 3. This appears to of increased notably compared to 2013. Thickened appearance of the interventricular septum raises the possibility of left ventricular hypertrophy. Hepatobiliary: Unremarkable Pancreas: Peripancreatic stranding particularly notable along the tail the pancreas. Bilobed cystic lesion along the pancreatic body measuring 1.6 by 0.8 by 0.7 cm, new compared to 01/31/2012. Spleen: Small spleen with complex collection along the posterior margin with calcified rim, compatible with residua from prior splenic injury. I am skeptical that this is a pancreatic pseudocyst. Adrenals/Urinary Tract: Adrenal glands unremarkable. There are multiple complex exophytic renal lesions bilaterally. Differences in density between portal venous and delayed phase images are probably accountable by slice selection, but strictly speaking the enhancement characteristics of these lesions are not characterized on today's exam given the lack of precontrast imaging. I observe for complex lesions on the right and 2 on the  left. Although these lesions have been present for years, they have been slowly increasing in size. Chronic parenchymal calcification in the right mid upper kidney medially, image 32/3, stable and likely postinflammatory. Scarring in the left mid kidney anterolaterally, favoring partial nephrectomy, correlate with operative history. 2 mm nonobstructive stone in the left kidney lower pole, image 58/6. Stomach/Bowel: Unremarkable  Vascular/Lymphatic: Aortoiliac atherosclerotic vascular disease. Reproductive: Unremarkable Other: No supplemental non-categorized findings. Musculoskeletal: Grade 1 degenerative retrolisthesis at L4-5, along with congenitally short pedicles, causing prominent central narrowing of the thecal sac at L3-4 and L4-5 as well as considerable foraminal impingement bilaterally at L4-5 and L5-S1. IMPRESSION: 1. Peripancreatic stranding compatible with acute pancreatitis. This is most concentrated along the pancreatic tail. Small cystic lesion in the pancreatic body superiorly, probably a small pseudocyst. No abscess or pancreatic necrosis identified. 2. Multiple bilateral slowly enlarging complex renal lesions. These are difficult to characterize for enhancement given the absence of precontrast imaging. I recommend renal protocol MRI with and without contrast for definitive characterization of these lesions. Specifically although these may represent slowly enlarging complex benign cysts, I cannot exclude renal neoplasm. 3. Notably progressive right coronary artery atherosclerotic calcification compared to the 2013 exams. Suspected left ventricular hypertrophy. Old splenic trauma with a complex collection along the posterior margin of the spleen with calcified rim. 4. 2 mm nonobstructive left kidney lower pole calculus. 5.  Aortic Atherosclerosis (ICD10-I70.0). 6. Lumbar spondylosis, degenerative disc disease, and congenitally short pedicles cause considerable impingement at L3-4, L4-5, and L5-S1. Electronically Signed   By: Van Clines M.D.   On: 05/20/2018 11:40   US Abdomen Limited Ruq  Result Date: 05/20/2018 CLINICAL DATA:  52 year old male with acute abdominal pain for 1 week. EXAM: ULTRASOUND ABDOMEN LIMITED RIGHT UPPER QUADRANT COMPARISON:  05/20/2018 CT FINDINGS: Gallbladder: Small mobile gallstones are identified, the largest measuring 6 mm. UPPER limits normal gallbladder wall thickening noted measuring  3 mm. No pericholecystic fluid. Patient on pain medications and sonographic Murphy sign not assessed. Common bile duct: Diameter: 3 mm.  No intrahepatic or extrahepatic biliary dilatation. Liver: No focal lesion identified. Within normal limits in parenchymal echogenicity. Portal vein is patent on color Doppler imaging with normal direction of blood flow towards the liver. Two UPPER pole RIGHT renal cysts are identified, the largest measuring 1.9 cm. IMPRESSION: 1. Cholelithiasis with UPPER limits of normal gallbladder wall thickening. This is equivocal for acute cholecystitis and consider nuclear medicine study as clinically indicated. 2. No biliary dilatation. 3. Benign RIGHT UPPER pole renal cysts. Electronically Signed   By: Margarette Canada M.D.   On: 05/20/2018 14:47    Anti-infectives: Anti-infectives (From admission, onward)   None       Assessment/Plan HTN Dyslipidemia CKD Tobacco abuse EtOH use- 1-4 beers daily  Acute pancreatitis, gallstone vs EtOH induced  - RUQ U/S 10/24 w/ cholelithiasis measuring up to 6 mm and mild wall thickening, no biliary ductal dilation - afebrile, VSS,  - Lipase 87 from 205, LFT's normal, no leukocytosis  - clinically the patient is improving and has a benign abdominal exam. - recommend lap chole this admission, will discuss timing of surgery with MD. If patient does not have surgery today then I will place order for clear liquids.  FEN - NPO ID - none VTE - SCD's, Lovenox    LOS: 2 days    Obie Dredge, [facility] Pager: [phone]

## 2018-05-22 NOTE — Transfer of Care (Signed)
Immediate Anesthesia Transfer of Care Note  Patient: Paul [name]  Procedure(s) Performed: LAPAROSCOPIC CHOLECYSTECTOMY WITH INTRAOPERATIVE CHOLANGIOGRAM (N/A Abdomen)  Patient Location: PACU  Anesthesia Type:General  Level of Consciousness: awake and patient cooperative  Airway & Oxygen Therapy: Patient Spontanous Breathing and Patient connected to nasal cannula oxygen  Post-op Assessment: Report given to RN and Post -op Vital signs reviewed and stable  Post vital signs: Reviewed and stable  Last Vitals:  Vitals Value Taken Time  BP 149/98 05/22/2018  3:29 PM  Temp    Pulse 65 05/22/2018  3:31 PM  Resp 20 05/22/2018  3:31 PM  SpO2 95 % 05/22/2018  3:31 PM  Vitals shown include unvalidated device data.  Last Pain:  Vitals:   05/22/18 0409  TempSrc: Oral  PainSc:       Patients Stated Pain Goal: 0 (64/29/03 7955)  Complications: No apparent anesthesia complications

## 2018-05-22 NOTE — Progress Notes (Signed)
RN verified the presence of a signed informed consent that matches stated procedure by patient. Verified armband matches patient's stated name and birth date. Verified NPO status (clear liquids at 1100; crackers ~ 0100) and that all jewelry, contact, glasses, dentures, and partials had been removed (if applicable). BP 195/108 Trish, CRNA at bedside and aware.

## 2018-05-23 ENCOUNTER — Encounter (HOSPITAL_COMMUNITY): Payer: Self-pay | Admitting: General Surgery

## 2018-05-23 LAB — LIPASE, BLOOD: LIPASE: 63 U/L — AB (ref 11–51)

## 2018-05-23 LAB — COMPREHENSIVE METABOLIC PANEL
ALT: 24 U/L (ref 0–44)
ANION GAP: 9 (ref 5–15)
AST: 28 U/L (ref 15–41)
Albumin: 2.9 g/dL — ABNORMAL LOW (ref 3.5–5.0)
Alkaline Phosphatase: 46 U/L (ref 38–126)
BILIRUBIN TOTAL: 0.4 mg/dL (ref 0.3–1.2)
BUN: 13 mg/dL (ref 6–20)
CHLORIDE: 102 mmol/L (ref 98–111)
CO2: 25 mmol/L (ref 22–32)
Calcium: 9.5 mg/dL (ref 8.9–10.3)
Creatinine, Ser: 1.24 mg/dL (ref 0.61–1.24)
GFR calc Af Amer: >60 mL/min (ref >60)
Glucose, Bld: 142 mg/dL — ABNORMAL HIGH (ref 70–99)
POTASSIUM: 3.9 mmol/L (ref 3.5–5.1)
Sodium: 136 mmol/L (ref 135–145)
TOTAL PROTEIN: 6.2 g/dL — AB (ref 6.5–8.1)

## 2018-05-23 LAB — CBC
HCT: 42.6 % (ref 39.0–52.0)
Hemoglobin: 13.7 g/dL (ref 13.0–17.0)
MCH: 30.9 pg (ref 26.0–34.0)
MCHC: 32.2 g/dL (ref 30.0–36.0)
MCV: 95.9 fL (ref 80.0–100.0)
NRBC: 0 % (ref 0.0–0.2)
Platelets: 302 10*3/uL (ref 150–400)
RBC: 4.44 MIL/uL (ref 4.22–5.81)
RDW: 12.8 % (ref 11.5–15.5)
WBC: 9.1 10*3/uL (ref 4.0–10.5)

## 2018-05-23 MED ORDER — NICOTINE POLACRILEX 2 MG MT GUM: 2.0000 mg | CHEWING_GUM | OROMUCOSAL | 0 refills | Status: DC | PRN

## 2018-05-23 MED ORDER — NICOTINE 21 MG/24HR TD PT24: 21.0000 mg | MEDICATED_PATCH | Freq: Every day | TRANSDERMAL | 0 refills | Status: DC

## 2018-05-23 MED ORDER — OXYCODONE HCL 5 MG PO TABS: 5.0000 mg | ORAL_TABLET | ORAL | 0 refills | Status: DC | PRN

## 2018-05-23 MED ORDER — CARVEDILOL 3.125 MG PO TABS: 3.1250 mg | ORAL_TABLET | Freq: Two times a day (BID) | ORAL | 0 refills | Status: DC

## 2018-05-23 MED ORDER — HYDRALAZINE HCL 10 MG PO TABS: 10.0000 mg | ORAL_TABLET | Freq: Three times a day (TID) | ORAL | 0 refills | Status: DC

## 2018-05-23 MED ORDER — AMLODIPINE BESYLATE 10 MG PO TABS: 10.0000 mg | ORAL_TABLET | Freq: Every day | ORAL | 0 refills | Status: DC

## 2018-05-23 MED ORDER — LOSARTAN POTASSIUM 25 MG PO TABS: 25.0000 mg | ORAL_TABLET | Freq: Every day | ORAL | 0 refills | Status: DC

## 2018-05-23 MED ORDER — PRAVASTATIN SODIUM 40 MG PO TABS: 40.0000 mg | ORAL_TABLET | Freq: Every evening | ORAL | 0 refills | Status: DC

## 2018-05-23 MED ORDER — LOSARTAN POTASSIUM 50 MG PO TABS
25.0000 mg | ORAL_TABLET | Freq: Every day | ORAL | Status: DC
  Administered 2018-05-23: 25 mg via ORAL
  Filled 2018-05-23: qty 1

## 2018-05-23 NOTE — Progress Notes (Signed)
Nsg Discharge Note  Admit Date:  05/20/2018 Discharge date: 05/23/2018   Paul [name] to be D/C'd Home per MD order.  AVS completed.  Copy for chart, and copy for patient signed, and dated. Patient/caregiver able to verbalize understanding.  Discharge Medication: Allergies as of 05/23/2018   No Known Allergies     Medication List    TAKE these medications   amLODipine 10 MG tablet Commonly known as:  NORVASC Take 1 tablet (10 mg total) by mouth daily. Start taking on:  05/24/2018   carvedilol 3.125 MG tablet Commonly known as:  COREG Take 1 tablet (3.125 mg total) by mouth 2 (two) times daily with a meal.   hydrALAZINE 10 MG tablet Commonly known as:  APRESOLINE Take 1 tablet (10 mg total) by mouth every 8 (eight) hours.   losartan 25 MG tablet Commonly known as:  COZAAR Take 1 tablet (25 mg total) by mouth daily. Start taking on:  05/24/2018   nicotine 21 mg/24hr patch Commonly known as:  NICODERM CQ - dosed in mg/24 hours Place 1 patch (21 mg total) onto the skin daily. Start taking on:  05/24/2018   nicotine polacrilex 2 MG gum Commonly known as:  NICORETTE Take 1 each (2 mg total) by mouth as needed for smoking cessation.   oxyCODONE 5 MG immediate release tablet Commonly known as:  Oxy IR/ROXICODONE Take 1 tablet (5 mg total) by mouth every 4 (four) hours as needed for up to 5 doses for severe pain.   pravastatin 40 MG tablet Commonly known as:  PRAVACHOL Take 1 tablet (40 mg total) by mouth every evening.       Discharge Assessment: Vitals:   05/23/18 0711 05/23/18 0901  BP: (!) 167/95 (!) 155/98  Pulse: (!) 59 64  Resp: 18   Temp: 98 F (36.7 C)   SpO2: 98%    Skin clean, dry and intact without evidence of skin break down, no evidence of skin tears noted. IV catheter discontinued intact. Site without signs and symptoms of complications - no redness or edema noted at insertion site, patient denies c/o pain - only slight tenderness at site.  Dressing  with slight pressure applied.  D/c Instructions-Education: Discharge instructions given to patient/family with verbalized understanding. D/c education completed with patient/family including follow up instructions, medication list, d/c activities limitations if indicated, with other d/c instructions as indicated by MD - patient able to verbalize understanding, all questions fully answered. Patient instructed to return to ED, call 911, or call MD for any changes in condition.  Patient escorted via [place], and D/C home via private auto.  Hiram Comber, RN 05/23/2018 12:56 PM

## 2018-05-23 NOTE — Care Management Note (Signed)
Case Management Note  Patient Details  Name: Paul [name] MRN: [id number] Date of Birth: Nov 28, 1965  Subjective/Objective:                    Action/Plan:  [place] letter provided and explained. No other CM needs. Signing off.  Expected Discharge Date:  05/23/18               Expected Discharge Plan:     In-House Referral:     Discharge planning Services  CM Consult, [facility]  Post Acute Care Choice:    Choice offered to:     DME Arranged:    DME Agency:     HH Arranged:    HH Agency:     Status of Service:  Completed, signed off  If discussed at [employer], dates discussed:    Additional Comments:  Carles Collet, RN 05/23/2018, 10:24 AM

## 2018-05-23 NOTE — Progress Notes (Signed)
[facility] Progress Note  1 Day Post-Op  Subjective: CC:  Denies any abdominal pain.  Not currently taking any narcotic medications.  Denies fever, chills, nausea, vomiting.  Tolerating p.o.  Urinating without issues.  Requesting to go home.  Asking for refill of his antihypertensives.  Objective: Vital signs in last 24 hours: Temp:  [97.3 F (36.3 C)-98.4 F (36.9 C)] 98 F (36.7 C) (10/27 0711) Pulse Rate:  [59-67] 59 (10/27 0711) Resp:  [16-19] 18 (10/27 0711) BP: (146-195)/(85-109) 167/95 (10/27 0711) SpO2:  [94 %-100 %] 98 % (10/27 0711) Weight:  [88.3 kg] 88.3 kg (10/27 0711) Last BM Date: 05/22/18  Intake/Output from previous day: 10/26 0701 - 10/27 0700 In: 1745 [I.V.:1745] Out: 2170 [Urine:2170] Intake/Output this shift: No intake/output data recorded.  PE: Gen:  Alert, NAD, pleasant Card:  Regular rate and rhythm Pulm:  Normal effort Abd: Soft, non-tender, mild distention, incisions clean and dry covered with Steri-Strips and Tegaderm.  +bowel sounds Skin: warm and dry, no rashes  Psych: A&Ox3   Lab Results:  Recent Labs    05/22/18 0440 05/23/18 0433  WBC 8.0 9.1  HGB 14.0 13.7  HCT 44.0 42.6  PLT 289 302   BMET Recent Labs    05/22/18 0440 05/23/18 0433  NA 137 136  K 3.7 3.9  CL 99 102  CO2 32 25  GLUCOSE 94 142*  BUN 10 13  CREATININE 1.28* 1.24  CALCIUM 9.2 9.5   PT/INR No results for input(s): LABPROT, INR in the last 72 hours. CMP     Component Value Date/Time   NA 136 05/23/2018 0433   K 3.9 05/23/2018 0433   CL 102 05/23/2018 0433   CO2 25 05/23/2018 0433   GLUCOSE 142 (H) 05/23/2018 0433   BUN 13 05/23/2018 0433   CREATININE 1.24 05/23/2018 0433   CALCIUM 9.5 05/23/2018 0433   CALCIUM 9.7 05/21/2018 0419   PROT 6.2 (L) 05/23/2018 0433   ALBUMIN 2.9 (L) 05/23/2018 0433   AST 28 05/23/2018 0433   ALT 24 05/23/2018 0433   ALKPHOS 46 05/23/2018 0433   BILITOT 0.4 05/23/2018 0433   GFRNONAA >60 05/23/2018  0433   GFRAA >60 05/23/2018 0433   Lipase     Component Value Date/Time   LIPASE 63 (H) 05/23/2018 0433       Studies/Results: Dg Cholangiogram Operative  Result Date: 05/22/2018 CLINICAL DATA:  Cholecystectomy for cholelithiasis and pancreatitis. EXAM: INTRAOPERATIVE CHOLANGIOGRAM TECHNIQUE: Cholangiographic images from the C-arm fluoroscopic device were submitted for interpretation post-operatively. Please see the procedural report for the amount of contrast and the fluoroscopy time utilized. COMPARISON:  Right upper quadrant abdominal ultrasound on 05/20/2018 FINDINGS: Intraoperative imaging with a C-arm demonstrates a normal opacified biliary tree without evidence of filling defect or obstruction. Contrast enters the duodenum normally. No contrast extravasation. IMPRESSION: Normal intraoperative cholangiogram. Electronically Signed   By: Aletta Edouard M.D.   On: 05/22/2018 15:03    Anti-infectives: Anti-infectives (From admission, onward)   None       Assessment/Plan Gallstone pancreatitis Postop day #1 status post laparoscopic cholecystectomy with IOC - Afebrile, no leukocytosis, LFTs normalized, IOC negative for biliary obstruction -Patient is stable for discharge from a surgical perspective, post op instructions including diet, activity, driving, and work discussed with the patient and he voiced understanding.  Follow-up was provided.  Patient asking if he can drive home, as long as he has not taken narcotics in the past 6 to 8 hours this is ok from  a surgical perspective.    LOS: 3 days    Obie Dredge, [facility] Pager: [phone]

## 2018-05-23 NOTE — Discharge Summary (Signed)
Physician Discharge Summary  Paul [name] WUJ:[id number] DOB: [birth year] DOA: 05/20/2018  PCP: [name], MD  Admit date: 05/20/2018 Discharge date: 05/23/2018  Time spent: 40 minutes  Recommendations for Outpatient Follow-up:  1. Follow up outpatient CBC/CMP 2. Follow up with surgery as scheduled 3. Follow up with PCP 4. Pt needs close follow up of blood pressure, continue to adjust medications as needed.  Needs follow up of creatinine/electrolytes with initiation of losartan. 5. Possible small pseudocyst, follow up outpatient 6. Needs outpatient MRI for follow up of renal lesions 7. Consider cardiology referral for coronary artery calcifications 8. Encourage risk factor modification, continued smoking cessation, BP control, cholesterol   Discharge Diagnoses:  Principal Problem:   Acute pancreatitis Active Problems:   Essential hypertension   Dyslipidemia   Renal lesion   Coronary artery calcification   Hypercalcemia   Tobacco abuse   Discharge Condition: stable  Diet recommendation: heart healthy  Filed Weights   05/21/18 0500 05/22/18 0500 05/23/18 0711  Weight: 88.2 kg 88.4 kg 88.3 kg    History of present illness:  Paul [name] 52 y.o.malewith medical history significant ofhypertension, appendectomy, removal of kidney cyst with epigastric pain for about Paul [name] week who presented with gallstone pancreatitis.  He was admitted for pancreatitis (due to gallstones vs etoh).  He improved with IVF.  He had cholecystectomy and was feeling well on post op day 1.  He was discharged with plans for outpatient follow up.  See below for additional details  Hospital Course:  Acute Pancreatitis: Given [place], likely gallstone. Endorses some etoh as well, also possible etiology. Pt also notably with hypercalcemia. Normal bili and alk phos. Hemodynamically stable.  Pain is resolved at this time Now s/p lap cholecystectomy yesterday with negative IOC Discharged with  oxycodone 5 mg # 5 pills (pt denies recent use of pain meds) - erroneously sent this to [facility] and then [facility] prior to sending to correct pharmacy (some confusion about preferred pharmacy).  Called to cancel prescription at these other locations. Antiemetics CT with evidence of pancreatitis with cystic lesion, likely pseudocyst (no abscess or necrosis identified)  Hypertension: significantly elevated in ED, but he appears to be asx from this. Has been prescribed meds in past, but he "dislikes" medications.  Discussed importance of BP control.  He's now agreeable. BP still elevated Amlodipine 10 mg Carvedilol 3.125 mg BID  PO hydral Losartan  HFpEF: grade III diastolic dysfunction.  BP control as noted above.  Losartan started.  Dyslipidemia: Pravastatin.  CKD II: creatinine 1.24 on day of discharge, follow closely  Elevated blood sugar:Hb a1c 5.6  Abnormal NFA:OZHYQM echo as noted above, continue to monitor  Complex Renal Lesions: pt with multiple bilateral enlarging complex renal lesions. Will need renal protocol MRI at some point. Discussed with pt to f/u with PCP to arrange this.  Right Coronary Artery Calcification: Progressive since 2013, would likely benefit from outpatient cardiology follow up.  Hypercalcemia: Possibly related to tums.  Improved, PTH nl.  Follow outpatient.  Tobacco abuse:encouraged cessation, follow  Etoh Use:pt drinks 1-4 beers/day, no hx of withdrawal, follow  Consultants:   surgery  Procedures: Study Conclusions  - Left ventricle: The cavity size was normal. Wall thickness was   increased in Maddisen Vought pattern of mild LVH. Systolic function was normal.   The estimated ejection fraction was in the range of 60% to 65%.   Wall motion was normal; there were no regional wall motion   abnormalities. Doppler parameters are consistent with  Shavy Beachem   reversible restrictive pattern, indicative of decreased left   ventricular  diastolic compliance and/or increased left atrial   pressure (grade 3 diastolic dysfunction). - Right atrium: The atrium was mildly dilated.  Lap cholecystectomy with IOC  Consultations:  surgery  Discharge Exam: Vitals:   05/23/18 0711 05/23/18 0901  BP: (!) 167/95 (!) 155/98  Pulse: (!) 59 64  Resp: 18   Temp: 98 F (36.7 C)   SpO2: 98%    Feeling well.  Ready to go.  General: No acute distress. Cardiovascular: Heart sounds show Christna Kulick regular rate, and rhythm.  Lungs: Clear to auscultation bilaterally with good air movement. No rales, rhonchi or wheezes. Abdomen: Soft, nontender, nondistended. Lap incisions well appearing. Neurological: Alert and oriented 3. Moves all extremities 4. Cranial nerves II through XII grossly intact. Skin: Warm and dry. No rashes or lesions. Extremities: No clubbing or cyanosis. No edema.Marland Kitchen Psychiatric: Mood and affect are normal. Insight and judgment are appropriate.  Discharge Instructions   Discharge Instructions    Call MD for:  difficulty breathing, headache or visual disturbances   Complete by:  As directed    Call MD for:  extreme fatigue   Complete by:  As directed    Call MD for:  persistant dizziness or light-headedness   Complete by:  As directed    Call MD for:  persistant nausea and vomiting   Complete by:  As directed    Call MD for:  redness, tenderness, or signs of infection (pain, swelling, redness, odor or green/yellow discharge around incision site)   Complete by:  As directed    Call MD for:  severe uncontrolled pain   Complete by:  As directed    Call MD for:  temperature >100.4   Complete by:  As directed    Diet - low sodium heart healthy   Complete by:  As directed    Discharge instructions   Complete by:  As directed    You were seen for pancreatitis.  We think this was due to gallstones, or possibly your alcohol use.  You had surgery to remove your gallbladder to help prevent this from reoccurring.  Please  refrain from alcohol use in the future as well as this may have contributed to your pancreatitis.   Your blood pressures were dangerously high.  They've improved, but still are not at the goal range.  High blood pressure puts you at risk for heart attack and stroke.  Your heart showed significant changes due to chronic high blood pressure and it will be important to take your medications in the future to reduce the risk of complications from you high blood pressure.  Your cholesterol was high and I've started you on pravastatin for cholesterol.  You had calcifications in your heart vessels which can be Latonyia Lopata sign of heart disease, please ask your PCP about seeing cardiology to ofllow this up.   You need an MRI to follow up your renal lesions.  Please follow up with your primary care doctor to arrange this.  Please follow up with your PCP within Kimyata Milich few days to follow up your blood pressure.  You'll need to follow up labs in Raef Sprigg few weeks because of your new blood pressure medicine.   Follow up with surgery as scheduled.  Return for new, recurrent, or worsening symptoms.  Please ask your PCP to request records from this hospitalization so they know what was done and what the next steps will be.  Increase activity slowly   Complete by:  As directed      Allergies as of 05/23/2018   No Known Allergies     Medication List    TAKE these medications   amLODipine 10 MG tablet Commonly known as:  NORVASC Take 1 tablet (10 mg total) by mouth daily. Start taking on:  05/24/2018   carvedilol 3.125 MG tablet Commonly known as:  COREG Take 1 tablet (3.125 mg total) by mouth 2 (two) times daily with Dannielle Baskins meal.   hydrALAZINE 10 MG tablet Commonly known as:  APRESOLINE Take 1 tablet (10 mg total) by mouth every 8 (eight) hours.   losartan 25 MG tablet Commonly known as:  COZAAR Take 1 tablet (25 mg total) by mouth daily. Start taking on:  05/24/2018   nicotine 21 mg/24hr patch Commonly known as:   NICODERM CQ - dosed in mg/24 hours Place 1 patch (21 mg total) onto the skin daily. Start taking on:  05/24/2018   nicotine polacrilex 2 MG gum Commonly known as:  NICORETTE Take 1 each (2 mg total) by mouth as needed for smoking cessation.   oxyCODONE 5 MG immediate release tablet Commonly known as:  Oxy IR/ROXICODONE Take 1 tablet (5 mg total) by mouth every 4 (four) hours as needed for up to 5 doses for severe pain.   pravastatin 40 MG tablet Commonly known as:  PRAVACHOL Take 1 tablet (40 mg total) by mouth every evening.      No Known Allergies Follow-up [facility] Follow up.   Specialty:  General Surgery Why:  our office is scheduling you for Alizae Bechtel post-operative follow up appointment. please call to confirm appointment date/time.  Contact information: [address] [place] [phone]       [name], MD Follow up.   Specialty:  Internal Medicine Contact information: [place] [address] [phone]            The results of significant diagnostics from this hospitalization (including imaging, microbiology, ancillary and laboratory) are listed below for reference.    Significant Diagnostic Studies: Dg Cholangiogram Operative  Result Date: 05/22/2018 CLINICAL DATA:  Cholecystectomy for cholelithiasis and pancreatitis. EXAM: INTRAOPERATIVE CHOLANGIOGRAM TECHNIQUE: Cholangiographic images from the C-arm fluoroscopic device were submitted for interpretation post-operatively. Please see the procedural report for the amount of contrast and the fluoroscopy time utilized. COMPARISON:  Right upper quadrant abdominal ultrasound on 05/20/2018 FINDINGS: Intraoperative imaging with Dortha Neighbors C-arm demonstrates Zachari Alberta normal opacified biliary tree without evidence of filling defect or obstruction. Contrast enters the duodenum normally. No contrast extravasation.  IMPRESSION: Normal intraoperative cholangiogram. Electronically Signed   By: Aletta Edouard M.D.   On: 05/22/2018 15:03   Ct Abdomen Pelvis W Contrast  Result Date: 05/20/2018 CLINICAL DATA:  Upper abdominal pain for the last week with vomiting. EXAM: CT ABDOMEN AND PELVIS WITH CONTRAST TECHNIQUE: Multidetector CT imaging of the abdomen and pelvis was performed using the standard protocol following bolus administration of intravenous contrast. CONTRAST:  166m OMNIPAQUE IOHEXOL 300 MG/ML  SOLN COMPARISON:  01/31/2012 CT ABDOMEN FINDINGS: Lower chest: Scattered bulla in the lung bases. Right coronary artery atherosclerotic calcification noted on images 2 through 9 series 3. This appears to of increased notably compared to 2013. Thickened appearance of the interventricular septum raises the possibility of left ventricular hypertrophy. Hepatobiliary: Unremarkable Pancreas: Peripancreatic stranding particularly notable along the tail the pancreas. Bilobed cystic lesion along  the pancreatic body measuring 1.6 by 0.8 by 0.7 cm, new compared to 01/31/2012. Spleen: Small spleen with complex collection along the posterior margin with calcified rim, compatible with residua from prior splenic injury. I am skeptical that this is Tariah Transue pancreatic pseudocyst. Adrenals/Urinary Tract: Adrenal glands unremarkable. There are multiple complex exophytic renal lesions bilaterally. Differences in density between portal venous and delayed phase images are probably accountable by slice selection, but strictly speaking the enhancement characteristics of these lesions are not characterized on today's exam given the lack of precontrast imaging. I observe for complex lesions on the right and 2 on the left. Although these lesions have been present for years, they have been slowly increasing in size. Chronic parenchymal calcification in the right mid upper kidney medially, image 32/3, stable and likely postinflammatory. Scarring in the left  mid kidney anterolaterally, favoring partial nephrectomy, correlate with operative history. 2 mm nonobstructive stone in the left kidney lower pole, image 58/6. Stomach/Bowel: Unremarkable Vascular/Lymphatic: Aortoiliac atherosclerotic vascular disease. Reproductive: Unremarkable Other: No supplemental non-categorized findings. Musculoskeletal: Grade 1 degenerative retrolisthesis at L4-5, along with congenitally short pedicles, causing prominent central narrowing of the thecal sac at L3-4 and L4-5 as well as considerable foraminal impingement bilaterally at L4-5 and L5-S1. IMPRESSION: 1. Peripancreatic stranding compatible with acute pancreatitis. This is most concentrated along the pancreatic tail. Small cystic lesion in the pancreatic body superiorly, probably Kendre Sires small pseudocyst. No abscess or pancreatic necrosis identified. 2. Multiple bilateral slowly enlarging complex renal lesions. These are difficult to characterize for enhancement given the absence of precontrast imaging. I recommend renal protocol MRI with and without contrast for definitive characterization of these lesions. Specifically although these may represent slowly enlarging complex benign cysts, I cannot exclude renal neoplasm. 3. Notably progressive right coronary artery atherosclerotic calcification compared to the 2013 exams. Suspected left ventricular hypertrophy. Old splenic trauma with Stephen Baruch complex collection along the posterior margin of the spleen with calcified rim. 4. 2 mm nonobstructive left kidney lower pole calculus. 5.  Aortic Atherosclerosis (ICD10-I70.0). 6. Lumbar spondylosis, degenerative disc disease, and congenitally short pedicles cause considerable impingement at L3-4, L4-5, and L5-S1. Electronically Signed   By: Van Clines M.D.   On: 05/20/2018 11:40   US Abdomen Limited Ruq  Result Date: 05/20/2018 CLINICAL DATA:  52 year old male with acute abdominal pain for 1 week. EXAM: ULTRASOUND ABDOMEN LIMITED RIGHT UPPER  QUADRANT COMPARISON:  05/20/2018 CT FINDINGS: Gallbladder: Small mobile gallstones are identified, the largest measuring 6 mm. UPPER limits normal gallbladder wall thickening noted measuring 3 mm. No pericholecystic fluid. Patient on pain medications and sonographic Murphy sign not assessed. Common bile duct: Diameter: 3 mm.  No intrahepatic or extrahepatic biliary dilatation. Liver: No focal lesion identified. Within normal limits in parenchymal echogenicity. Portal vein is patent on color Doppler imaging with normal direction of blood flow towards the liver. Two UPPER pole RIGHT renal cysts are identified, the largest measuring 1.9 cm. IMPRESSION: 1. Cholelithiasis with UPPER limits of normal gallbladder wall thickening. This is equivocal for acute cholecystitis and consider nuclear medicine study as clinically indicated. 2. No biliary dilatation. 3. Benign RIGHT UPPER pole renal cysts. Electronically Signed   By: Margarette Canada M.D.   On: 05/20/2018 14:47    Microbiology: Recent Results (from the past 240 hour(s))  MRSA PCR Screening     Status: Abnormal   Collection Time: 05/22/18 12:53 PM  Result Value Ref Range Status   MRSA by PCR POSITIVE (Anijah Spohr) NEGATIVE Final    Comment:  The GeneXpert MRSA Assay (FDA approved for NASAL specimens only), is one component of Isak Sotomayor comprehensive MRSA colonization surveillance program. It is not intended to diagnose MRSA infection nor to guide or monitor treatment for MRSA infections. RESULT CALLED TO, READ BACK BY AND VERIFIED WITH: Karren Cobble RN [id number]      Labs: Basic Metabolic Panel: Recent Labs  Lab 05/20/18 0717 05/21/18 0419 05/22/18 0440 05/23/18 0433  NA 136 138 137 136  K 3.8 4.4 3.7 3.9  CL 95* 93* 99 102  CO2 32 31 32 25  GLUCOSE 144* 94 94 142*  BUN _0 CREATININE 1.37* 1.40* 1.28* 1.24  CALCIUM 12.1* 10.0  9.7 9.2 9.5   Liver Function Tests: Recent Labs  Lab 05/20/18 0717 05/21/18 0419 05/22/18 0440  05/23/18 0433  AST 20 14* 13* 28  ALT _1 ALKPHOS 64 48 46 46  BILITOT 1.0 1.0 1.0 0.4  PROT 8.4* 6.6 6.3* 6.2*  ALBUMIN 4.3 3.3* 2.9* 2.9*   Recent Labs  Lab 05/20/18 0717 05/21/18 0419 05/22/18 0440 05/23/18 0433  LIPASE 278* 205* 87* 63*   No results for input(s): AMMONIA in the last 168 hours. CBC: Recent Labs  Lab 05/20/18 0717 05/21/18 0419 05/22/18 0440 05/23/18 0433  WBC 9.6 8.8 8.0 9.1  HGB 17.5* 14.8 14.0 13.7  HCT 52.3* 46.2 44.0 42.6  MCV 93.9 96.9 96.1 95.9  PLT 347 288 289 302   Cardiac Enzymes: No results for input(s): CKTOTAL, CKMB, CKMBINDEX, TROPONINI in the last 168 hours. BNP: BNP (last 3 results) No results for input(s): BNP in the last 8760 hours.  ProBNP (last 3 results) No results for input(s): PROBNP in the last 8760 hours.  CBG: No results for input(s): GLUCAP in the last 168 hours.     Signed:  Fayrene Helper MD.  [facility] 05/23/2018, 9:27 AM

## 2018-05-23 NOTE — Discharge Instructions (Signed)
[facility] °LAPAROSCOPIC SURGERY: POST OP INSTRUCTIONS °Always review your discharge instruction sheet given to you by the facility where your surgery was performed. °IF YOU HAVE DISABILITY OR FAMILY LEAVE FORMS, YOU MUST BRING THEM TO THE OFFICE FOR PROCESSING.   °DO NOT GIVE THEM TO YOUR DOCTOR. ° °PAIN CONTROL ° °1. First take acetaminophen (Tylenol) AND/or ibuprofen (Advil) to control your pain after surgery.  Follow directions on package.  Taking acetaminophen (Tylenol) and/or ibuprofen (Advil) regularly after surgery will help to control your pain and lower the amount of prescription pain medication you may need.  You should not take more than 3,000 mg (3 grams) of acetaminophen (Tylenol) in 24 hours.  You should not take ibuprofen (Advil), aleve, motrin, naprosyn or other NSAIDS if you have a history of stomach ulcers or chronic kidney disease.  °2. A prescription for pain medication may be given to you upon discharge.  Take your pain medication as prescribed, if you still have uncontrolled pain after taking acetaminophen (Tylenol) or ibuprofen (Advil). °3. Use ice packs to help control pain. °4. If you need a refill on your pain medication, please contact your pharmacy.  They will contact our office to request authorization. Prescriptions will not be filled after 5pm or on week-ends. ° °HOME MEDICATIONS °5. Take your usually prescribed medications unless otherwise directed. ° °DIET °6. You should follow a light diet the first few days after arrival home.  Be sure to include lots of fluids daily. Avoid fatty, fried foods.  ° °CONSTIPATION °7. It is common to experience some constipation after surgery and if you are taking pain medication.  Increasing fluid intake and taking a stool softener (such as Colace) will usually help or prevent this problem from occurring.  A mild laxative (Milk of Magnesia or Miralax) should be taken according to package instructions if there are no bowel  movements after 48 hours. ° °WOUND/INCISION CARE °8. Most patients will experience some swelling and bruising in the area of the incisions.  Ice packs will help.  Swelling and bruising can take several days to resolve.  °9. Unless discharge instructions indicate otherwise, follow guidelines below  °a. STERI-STRIPS - you may remove your outer bandages 48 hours after surgery, and you may shower at that time.  You have steri-strips (small skin tapes) in place directly over the incision.  These strips should be left on the skin for 7-10 days.   °b. DERMABOND/SKIN GLUE - you may shower in 24 hours.  The glue will flake off over the next 2-3 weeks. °10. Any sutures or staples will be removed at the office during your follow-up visit. ° °ACTIVITIES °11. You may resume regular (light) daily activities beginning the next day--such as daily self-care, walking, climbing stairs--gradually increasing activities as tolerated.  You may have sexual intercourse when it is comfortable.  Refrain from any heavy lifting or straining until approved by your doctor. °a. You may drive when you are no longer taking prescription pain medication, you can comfortably wear a seatbelt, and you can safely maneuver your car and apply brakes. ° °FOLLOW-UP °12. You should see your doctor in the office for a follow-up appointment approximately 2-3 weeks after your surgery.  You should have been given your post-op/follow-up appointment when your surgery was scheduled.  If you did not receive a post-op/follow-up appointment, make sure that you call for this appointment within a day or two after you arrive home to insure a convenient appointment time. ° °OTHER   INSTRUCTIONS °13.  ° °WHEN TO CALL YOUR DOCTOR: °1. Fever over 101.0 °2. Inability to urinate °3. Continued bleeding from incision. °4. Increased pain, redness, or drainage from the incision. °5. Increasing abdominal pain ° °The clinic staff is available to answer your questions during regular  business hours.  Please don’t hesitate to call and ask to speak to one of the nurses for clinical concerns.  If you have a medical emergency, go to the nearest emergency room or call 911.  A surgeon from [facility] is always on call at the hospital. °[address], [address], [place], [place]  [address] ? [address] [address], [place], [place]   [address] °[phone] ? [phone] ? FAX [phone] °Web site: [email or web address] ° °

## 2018-05-23 NOTE — Anesthesia Postprocedure Evaluation (Signed)
Anesthesia Post Note  Patient: Paul [name]  Procedure(s) Performed: LAPAROSCOPIC CHOLECYSTECTOMY WITH INTRAOPERATIVE CHOLANGIOGRAM (N/A Abdomen)     Patient location during evaluation: PACU Anesthesia Type: General Level of consciousness: awake and alert Pain management: pain level controlled Vital Signs Assessment: post-procedure vital signs reviewed and stable Respiratory status: spontaneous breathing, nonlabored ventilation, respiratory function stable and patient connected to nasal cannula oxygen Cardiovascular status: blood pressure returned to baseline and stable Postop Assessment: no apparent nausea or vomiting Anesthetic complications: no    Last Vitals:  Vitals:   05/22/18 1615 05/22/18 2138  BP: (!) 146/96 (!) 147/93  Pulse: 67 67  Resp: 18 16  Temp: 36.6 C 36.9 C  SpO2: 98% 94%    Last Pain:  Vitals:   05/22/18 2212  TempSrc:   PainSc: Asleep                 Effie Berkshire

## 2018-09-27 DIAGNOSIS — K852 Alcohol induced acute pancreatitis without necrosis or infection: Secondary | ICD-10-CM

## 2018-09-27 DIAGNOSIS — I161 Hypertensive emergency: Secondary | ICD-10-CM

## 2018-09-27 DIAGNOSIS — K859 Acute pancreatitis without necrosis or infection, unspecified: Secondary | ICD-10-CM

## 2018-10-11 ENCOUNTER — Other Ambulatory Visit: Payer: Self-pay

## 2018-10-11 ENCOUNTER — Encounter (HOSPITAL_COMMUNITY): Payer: Self-pay | Admitting: Emergency Medicine

## 2018-10-11 ENCOUNTER — Inpatient Hospital Stay (HOSPITAL_COMMUNITY): Payer: Self-pay

## 2018-10-11 ENCOUNTER — Other Ambulatory Visit (HOSPITAL_COMMUNITY): Payer: Self-pay

## 2018-10-11 ENCOUNTER — Inpatient Hospital Stay (HOSPITAL_COMMUNITY)
Admission: EM | Admit: 2018-10-11 | Discharge: 2018-10-13 | DRG: 439 | Disposition: A | Discharge status: Home / Self Care | Payer: Self-pay | Attending: Internal Medicine | Admitting: Internal Medicine

## 2018-10-11 DIAGNOSIS — E785 Hyperlipidemia, unspecified: Secondary | ICD-10-CM | POA: Diagnosis present

## 2018-10-11 DIAGNOSIS — F17210 Nicotine dependence, cigarettes, uncomplicated: Secondary | ICD-10-CM | POA: Diagnosis present

## 2018-10-11 DIAGNOSIS — Z9049 Acquired absence of other specified parts of digestive tract: Secondary | ICD-10-CM

## 2018-10-11 DIAGNOSIS — K8500 Idiopathic acute pancreatitis without necrosis or infection: Secondary | ICD-10-CM

## 2018-10-11 DIAGNOSIS — R109 Unspecified abdominal pain: Secondary | ICD-10-CM

## 2018-10-11 DIAGNOSIS — Z66 Do not resuscitate: Secondary | ICD-10-CM | POA: Diagnosis present

## 2018-10-11 DIAGNOSIS — I10 Essential (primary) hypertension: Secondary | ICD-10-CM | POA: Diagnosis present

## 2018-10-11 DIAGNOSIS — N281 Cyst of kidney, acquired: Secondary | ICD-10-CM | POA: Diagnosis present

## 2018-10-11 DIAGNOSIS — K859 Acute pancreatitis without necrosis or infection, unspecified: Principal | ICD-10-CM | POA: Diagnosis present

## 2018-10-11 DIAGNOSIS — E781 Pure hyperglyceridemia: Secondary | ICD-10-CM | POA: Diagnosis present

## 2018-10-11 DIAGNOSIS — N183 Chronic kidney disease, stage 3 unspecified: Secondary | ICD-10-CM | POA: Diagnosis present

## 2018-10-11 DIAGNOSIS — I444 Left anterior fascicular block: Secondary | ICD-10-CM | POA: Diagnosis present

## 2018-10-11 DIAGNOSIS — I5032 Chronic diastolic (congestive) heart failure: Secondary | ICD-10-CM | POA: Diagnosis present

## 2018-10-11 DIAGNOSIS — N182 Chronic kidney disease, stage 2 (mild): Secondary | ICD-10-CM | POA: Diagnosis present

## 2018-10-11 DIAGNOSIS — N289 Disorder of kidney and ureter, unspecified: Secondary | ICD-10-CM

## 2018-10-11 DIAGNOSIS — I130 Hypertensive heart and chronic kidney disease with heart failure and stage 1 through stage 4 chronic kidney disease, or unspecified chronic kidney disease: Secondary | ICD-10-CM | POA: Diagnosis present

## 2018-10-11 DIAGNOSIS — Z716 Tobacco abuse counseling: Secondary | ICD-10-CM

## 2018-10-11 DIAGNOSIS — I160 Hypertensive urgency: Secondary | ICD-10-CM | POA: Diagnosis present

## 2018-10-11 DIAGNOSIS — Z79899 Other long term (current) drug therapy: Secondary | ICD-10-CM

## 2018-10-11 DIAGNOSIS — Z720 Tobacco use: Secondary | ICD-10-CM | POA: Diagnosis present

## 2018-10-11 LAB — CBC
HCT: 50.6 % (ref 39.0–52.0)
HEMOGLOBIN: 17 g/dL (ref 13.0–17.0)
MCH: 31 pg (ref 26.0–34.0)
MCHC: 33.6 g/dL (ref 30.0–36.0)
MCV: 92.3 fL (ref 80.0–100.0)
PLATELETS: 558 10*3/uL — AB (ref 150–400)
RBC: 5.48 MIL/uL (ref 4.22–5.81)
RDW: 13.5 % (ref 11.5–15.5)
WBC: 15.5 10*3/uL — AB (ref 4.0–10.5)
nRBC: 0 % (ref 0.0–0.2)

## 2018-10-11 LAB — MRSA PCR SCREENING: MRSA by PCR: NEGATIVE

## 2018-10-11 LAB — URINALYSIS, ROUTINE W REFLEX MICROSCOPIC
BILIRUBIN URINE: NEGATIVE
Bacteria, UA: NONE SEEN
CELLULAR CAST UA: 3
Glucose, UA: NEGATIVE mg/dL
Hgb urine dipstick: NEGATIVE
Ketones, ur: NEGATIVE mg/dL
Leukocytes,Ua: NEGATIVE
NITRITE: NEGATIVE
PH: 5 (ref 5.0–8.0)
Protein, ur: 30 mg/dL — AB
SPECIFIC GRAVITY, URINE: 1.013 (ref 1.005–1.030)

## 2018-10-11 LAB — COMPREHENSIVE METABOLIC PANEL
ALT: 28 U/L (ref 0–44)
ANION GAP: 13 (ref 5–15)
AST: 21 U/L (ref 15–41)
Albumin: 4.1 g/dL (ref 3.5–5.0)
Alkaline Phosphatase: 80 U/L (ref 38–126)
BUN: 12 mg/dL (ref 6–20)
CALCIUM: 10.3 mg/dL (ref 8.9–10.3)
CO2: 22 mmol/L (ref 22–32)
Chloride: 99 mmol/L (ref 98–111)
Creatinine, Ser: 1.37 mg/dL — ABNORMAL HIGH (ref 0.61–1.24)
GFR calc Af Amer: >60 mL/min (ref >60)
GFR, EST NON AFRICAN AMERICAN: 60 mL/min — AB (ref >60)
GLUCOSE: 125 mg/dL — AB (ref 70–99)
POTASSIUM: 3.9 mmol/L (ref 3.5–5.1)
Sodium: 134 mmol/L — ABNORMAL LOW (ref 135–145)
TOTAL PROTEIN: 8.4 g/dL — AB (ref 6.5–8.1)
Total Bilirubin: 0.6 mg/dL (ref 0.3–1.2)

## 2018-10-11 LAB — LIPASE, BLOOD: LIPASE: 1443 U/L — AB (ref 11–51)

## 2018-10-11 LAB — LACTATE DEHYDROGENASE: LDH: 108 U/L (ref 98–192)

## 2018-10-11 LAB — GAMMA GT: GGT: 23 U/L (ref 7–50)

## 2018-10-11 MED ORDER — MORPHINE SULFATE (PF) 2 MG/ML IV SOLN: 2.0000 mg | INTRAVENOUS | Status: DC | PRN

## 2018-10-11 MED ORDER — THIAMINE HCL 100 MG/ML IJ SOLN
Freq: Once | INTRAVENOUS | Status: AC
  Administered 2018-10-11: 17:00:00 via INTRAVENOUS
  Filled 2018-10-11 (×2): qty 1000

## 2018-10-11 MED ORDER — MORPHINE SULFATE (PF) 2 MG/ML IV SOLN
2.0000 mg | INTRAVENOUS | Status: DC | PRN
  Administered 2018-10-11: 2 mg via INTRAVENOUS
  Administered 2018-10-11: 4 mg via INTRAVENOUS
  Administered 2018-10-11: 2 mg via INTRAVENOUS
  Administered 2018-10-11 – 2018-10-12 (×2): 4 mg via INTRAVENOUS
  Administered 2018-10-12 (×3): 2 mg via INTRAVENOUS
  Administered 2018-10-12 – 2018-10-13 (×5): 4 mg via INTRAVENOUS
  Filled 2018-10-11: qty 2
  Filled 2018-10-11 (×2): qty 1
  Filled 2018-10-11 (×3): qty 2
  Filled 2018-10-11 (×2): qty 1
  Filled 2018-10-11 (×4): qty 2
  Filled 2018-10-11: qty 1

## 2018-10-11 MED ORDER — HYDROMORPHONE HCL 1 MG/ML IJ SOLN
1.0000 mg | Freq: Once | INTRAMUSCULAR | Status: AC
  Administered 2018-10-11: 1 mg via INTRAVENOUS
  Filled 2018-10-11: qty 1

## 2018-10-11 MED ORDER — MORPHINE SULFATE (PF) 4 MG/ML IV SOLN
4.0000 mg | Freq: Once | INTRAVENOUS | Status: AC
  Administered 2018-10-11: 4 mg via INTRAVENOUS
  Filled 2018-10-11: qty 1

## 2018-10-11 MED ORDER — ONDANSETRON HCL 4 MG PO TABS: 4.0000 mg | ORAL_TABLET | Freq: Four times a day (QID) | ORAL | Status: DC | PRN

## 2018-10-11 MED ORDER — HYDRALAZINE HCL 20 MG/ML IJ SOLN
10.0000 mg | Freq: Once | INTRAMUSCULAR | Status: AC
  Administered 2018-10-11: 10 mg via INTRAVENOUS
  Filled 2018-10-11: qty 1

## 2018-10-11 MED ORDER — CARVEDILOL 3.125 MG PO TABS
3.1250 mg | ORAL_TABLET | Freq: Two times a day (BID) | ORAL | Status: DC
  Administered 2018-10-11 – 2018-10-13 (×5): 3.125 mg via ORAL
  Filled 2018-10-11 (×5): qty 1

## 2018-10-11 MED ORDER — MUPIROCIN 2 % EX OINT
1.0000 "application " | TOPICAL_OINTMENT | Freq: Two times a day (BID) | CUTANEOUS | Status: DC
  Administered 2018-10-11: 1 via NASAL
  Filled 2018-10-11: qty 22

## 2018-10-11 MED ORDER — NICOTINE 21 MG/24HR TD PT24
21.0000 mg | MEDICATED_PATCH | Freq: Every day | TRANSDERMAL | Status: DC
  Administered 2018-10-11 – 2018-10-13 (×3): 21 mg via TRANSDERMAL
  Filled 2018-10-11 (×3): qty 1

## 2018-10-11 MED ORDER — ACETAMINOPHEN 650 MG RE SUPP: 650.0000 mg | Freq: Four times a day (QID) | RECTAL | Status: DC | PRN

## 2018-10-11 MED ORDER — LORAZEPAM 2 MG/ML IJ SOLN: 1.0000 mg | Freq: Four times a day (QID) | INTRAMUSCULAR | Status: DC | PRN

## 2018-10-11 MED ORDER — ONDANSETRON HCL 4 MG/2ML IJ SOLN
4.0000 mg | Freq: Four times a day (QID) | INTRAMUSCULAR | Status: DC | PRN
  Administered 2018-10-11 – 2018-10-12 (×3): 4 mg via INTRAVENOUS
  Filled 2018-10-11 (×3): qty 2

## 2018-10-11 MED ORDER — SODIUM CHLORIDE 0.9 % IV BOLUS
1000.0000 mL | Freq: Once | INTRAVENOUS | Status: AC
  Administered 2018-10-11: 1000 mL via INTRAVENOUS

## 2018-10-11 MED ORDER — CHLORHEXIDINE GLUCONATE CLOTH 2 % EX PADS: 6.0000 | MEDICATED_PAD | Freq: Every day | CUTANEOUS | Status: DC

## 2018-10-11 MED ORDER — GADOBUTROL 1 MMOL/ML IV SOLN
8.0000 mL | Freq: Once | INTRAVENOUS | Status: AC | PRN
  Administered 2018-10-11: 8 mL via INTRAVENOUS

## 2018-10-11 MED ORDER — ENOXAPARIN SODIUM 40 MG/0.4ML ~~LOC~~ SOLN
40.0000 mg | SUBCUTANEOUS | Status: DC
  Administered 2018-10-11 – 2018-10-12 (×2): 40 mg via SUBCUTANEOUS
  Filled 2018-10-11 (×2): qty 0.4

## 2018-10-11 MED ORDER — LABETALOL HCL 5 MG/ML IV SOLN
20.0000 mg | Freq: Once | INTRAVENOUS | Status: AC
  Administered 2018-10-11: 20 mg via INTRAVENOUS
  Filled 2018-10-11: qty 4

## 2018-10-11 MED ORDER — LACTATED RINGERS IV SOLN
INTRAVENOUS | Status: DC
  Administered 2018-10-11 – 2018-10-13 (×6): via INTRAVENOUS

## 2018-10-11 MED ORDER — ACETAMINOPHEN 325 MG PO TABS: 650.0000 mg | ORAL_TABLET | Freq: Four times a day (QID) | ORAL | Status: DC | PRN

## 2018-10-11 MED ORDER — AMLODIPINE BESYLATE 10 MG PO TABS
10.0000 mg | ORAL_TABLET | Freq: Every day | ORAL | Status: DC
  Administered 2018-10-12 – 2018-10-13 (×2): 10 mg via ORAL
  Filled 2018-10-11 (×2): qty 1

## 2018-10-11 MED ORDER — ONDANSETRON HCL 4 MG/2ML IJ SOLN
4.0000 mg | Freq: Once | INTRAMUSCULAR | Status: AC
  Administered 2018-10-11: 4 mg via INTRAVENOUS
  Filled 2018-10-11: qty 2

## 2018-10-11 MED ORDER — SODIUM CHLORIDE 0.9 % IV BOLUS
500.0000 mL | Freq: Once | INTRAVENOUS | Status: AC
  Administered 2018-10-11: 500 mL via INTRAVENOUS

## 2018-10-11 MED ORDER — HYDRALAZINE HCL 20 MG/ML IJ SOLN
10.0000 mg | INTRAMUSCULAR | Status: DC | PRN
  Administered 2018-10-11 (×2): 10 mg via INTRAVENOUS
  Filled 2018-10-11 (×2): qty 1

## 2018-10-11 MED ORDER — MORPHINE SULFATE (PF) 4 MG/ML IV SOLN: 4.0000 mg | Freq: Once | INTRAVENOUS | Status: DC

## 2018-10-11 NOTE — Progress Notes (Signed)
  Pt orientation to unit, room and routine. Information packet given to patient/family and safety video watched.  Admission INP armband ID verified with patient/family, and in place. SR up x 2, fall risk assessment complete with Patient and family verbalizing understanding of risks associated with falls. Pt verbalizes an understanding of how to use the call bell and to call for help before getting out of bed.  Skin, clean-dry- intact without evidence of bruising, or skin tears.   No evidence of skin break down noted on exam. Will cont to monitor and assist as needed.   
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
## 2018-10-11 NOTE — ED Triage Notes (Signed)
Patient arrives POV c/o abdominal pain that woke him from him sleep around 2 am. Took 600mg  tylenol about that time with no relief. Reports upper mid abdominal pain into bilateral ribs. Reports 1 episode of emesis. Gallbladder removed in October.

## 2018-10-12 LAB — COMPREHENSIVE METABOLIC PANEL
ALT: 17 U/L (ref 0–44)
AST: 13 U/L — AB (ref 15–41)
Albumin: 2.9 g/dL — ABNORMAL LOW (ref 3.5–5.0)
Alkaline Phosphatase: 59 U/L (ref 38–126)
Anion gap: 9 (ref 5–15)
BUN: 14 mg/dL (ref 6–20)
CO2: 24 mmol/L (ref 22–32)
Calcium: 9.2 mg/dL (ref 8.9–10.3)
Chloride: 104 mmol/L (ref 98–111)
Creatinine, Ser: 1.06 mg/dL (ref 0.61–1.24)
GFR calc Af Amer: >60 mL/min (ref >60)
GFR calc non Af Amer: >60 mL/min (ref >60)
Glucose, Bld: 88 mg/dL (ref 70–99)
Potassium: 3.9 mmol/L (ref 3.5–5.1)
Sodium: 137 mmol/L (ref 135–145)
Total Bilirubin: 0.7 mg/dL (ref 0.3–1.2)
Total Protein: 6.3 g/dL — ABNORMAL LOW (ref 6.5–8.1)

## 2018-10-12 LAB — CBC
HEMATOCRIT: 42.5 % (ref 39.0–52.0)
Hemoglobin: 14.5 g/dL (ref 13.0–17.0)
MCH: 32.2 pg (ref 26.0–34.0)
MCHC: 34.1 g/dL (ref 30.0–36.0)
MCV: 94.2 fL (ref 80.0–100.0)
Platelets: 450 10*3/uL — ABNORMAL HIGH (ref 150–400)
RBC: 4.51 MIL/uL (ref 4.22–5.81)
RDW: 14.1 % (ref 11.5–15.5)
WBC: 13 10*3/uL — ABNORMAL HIGH (ref 4.0–10.5)
nRBC: 0 % (ref 0.0–0.2)

## 2018-10-12 LAB — LIPID PANEL
Cholesterol: 127 mg/dL (ref 0–200)
HDL: 27 mg/dL — ABNORMAL LOW (ref >40)
LDL CALC: 77 mg/dL (ref 0–99)
Total CHOL/HDL Ratio: 4.7 RATIO
Triglycerides: 114 mg/dL (ref <150)
VLDL: 23 mg/dL (ref 0–40)

## 2018-10-12 NOTE — Progress Notes (Signed)
   10/11/18 2204  Vitals  Temp 98 F (36.7 C)  Temp Source Oral  BP (!) 190/116  MAP (mmHg) 138  BP Location Left Arm  BP Method Automatic  Patient Position (if appropriate) Sitting  Pulse Rate 85  Pulse Rate Source Monitor  Resp 16  Oxygen Therapy  SpO2 99 %  O2 Device Room Air  MEWS Score  MEWS RR 0  MEWS Pulse 0  MEWS Systolic 0  MEWS LOC 0  MEWS Temp 0  MEWS Score 0  MEWS Score Color Green   Pt hypertensive Bp( 190/116) and PRN hydralazine 10mg  given with relief

## 2018-10-12 NOTE — Progress Notes (Signed)
[name], male   DOB: Nov 26, 1965, 53 y.o.   MRN: [id number]  PROGRESS NOTE    Paul [name]  GGE:[id number] DOB: 08-10-1965 DOA: 10/11/2018 PCP: Paul Dress, MD   Brief Narrative:  53 year old male with history of osteomyelitis with cholecystectomy in October 2019, hypertension, chronic diastolic CHF, hyperlipidemia, stage II CKD and tobacco dependence presented with abdominal pain.  He was found to have acute pancreatitis with elevated lipase.  Assessment & Plan:   Principal Problem:   Acute pancreatitis Active Problems:   Essential hypertension   Dyslipidemia   Renal lesion   Tobacco abuse   Chronic diastolic CHF (congestive heart failure) (HCC)   CKD (chronic kidney disease) stage 2, GFR 60-89 ml/min  Acute pancreatitis -Unclear etiology.  Might be alcohol-related.  Patient has had cholecystectomy in October 2019.  MRI/MRCP showed pancreatitis without necrosis and no biliary dilatation.  Triglycerides normal.  Statin on hold. -Abdominal pain improving.  Still requiring IV morphine.  Will try clear liquid diet for lunch and advance diet if tolerated.  Decrease normal saline to 150 cc an hour.  Leukocytosis -Probably reactive.  Improving.  Monitor  Thrombocytosis -Probably reactive.  Monitor.  Renal lesions -MRI abdomen shows bilateral renal cysts, many of which are hemorrhagic, unchanged.  No hydronephrosis.  Might need outpatient urology evaluation  Hypertension, uncontrolled -Blood pressure is extremely elevated.  Resume Coreg and amlodipine.  Use IV antihypertensives if needed.  We will also resume Cozaar.  Hyperlipidemia--Pravachol on hold  Chronic diastolic CHF -Appears compensated.  Monitor  Chronic renal disease stage II -Renal function stable.  Monitor  Tobacco dependence -Patient was counseled about cessation by admitting hospitalist.   DVT prophylaxis: Lovenox Code Status: DNR Family Communication: None at bedside Disposition Plan: Home  tomorrow if symptoms keep improving and patient tolerates diet.  Consultants: None  Procedures: None  Antimicrobials: None   Subjective: Patient seen and examined at bedside.  He states that his abdominal pain is slightly better.  Slight nausea but no vomiting.  Has not tried diet yet.  No overnight fevers.  Objective: Vitals:   10/11/18 1610 10/11/18 2204 10/12/18 0356 10/12/18 0928  BP: (!) 187/115 (!) 190/116 (!) 169/111 (!) 185/102  Pulse: 89 85 95 83  Resp:  16  16  Temp:  98 F (36.7 C) 98.2 F (36.8 C)   TempSrc:  Oral Oral   SpO2:  99% 96% 100%  Weight:      Height:        Intake/Output Summary (Last 24 hours) at 10/12/2018 0957 Last data filed at 10/12/2018 4650 Gross per 24 hour  Intake 4254.25 ml  Output 400 ml  Net 3854.25 ml   Filed Weights   10/11/18 0848  Weight: 83.9 kg    Examination:  General exam: Appears calm and comfortable  Respiratory system: Bilateral decreased breath sounds at bases Cardiovascular system: S1 & S2 heard, Rate controlled Gastrointestinal system: Abdomen is nondistended, soft and mildly tender in the epigastric region. Normal bowel sounds heard. Extremities: No cyanosis, clubbing, edema    Data Reviewed: I have personally reviewed following labs and imaging studies  CBC: Recent Labs  Lab 10/11/18 0858 10/12/18 0340  WBC 15.5* 13.0*  HGB 17.0 14.5  HCT 50.6 42.5  MCV 92.3 94.2  PLT 558* 354*   Basic Metabolic Panel: Recent Labs  Lab 10/11/18 0858 10/12/18 0340  NA 134* 137  K 3.9 3.9  CL 99 104  CO2 22 24  GLUCOSE 125* 88  BUN 12 14  CREATININE 1.37* 1.06  CALCIUM 10.3 9.2   GFR: Estimated Creatinine Clearance: 91.5 mL/min (by C-G formula based on SCr of 1.06 mg/dL). Liver Function Tests: Recent Labs  Lab 10/11/18 0858 10/12/18 0340  AST 21 13*  ALT 28 17  ALKPHOS 80 59  BILITOT 0.6 0.7  PROT 8.4* 6.3*  ALBUMIN 4.1 2.9*   Recent Labs  Lab 10/11/18 0858  LIPASE 1,443*   No results for  input(s): AMMONIA in the last 168 hours. Coagulation Profile: No results for input(s): INR, PROTIME in the last 168 hours. Cardiac Enzymes: No results for input(s): CKTOTAL, CKMB, CKMBINDEX, TROPONINI in the last 168 hours. BNP (last 3 results) No results for input(s): PROBNP in the last 8760 hours. HbA1C: No results for input(s): HGBA1C in the last 72 hours. CBG: No results for input(s): GLUCAP in the last 168 hours. Lipid Profile: Recent Labs    10/12/18 0340  CHOL 127  HDL 27*  LDLCALC 77  TRIG 114  CHOLHDL 4.7   Thyroid Function Tests: No results for input(s): TSH, T4TOTAL, FREET4, T3FREE, THYROIDAB in the last 72 hours. Anemia Panel: No results for input(s): VITAMINB12, FOLATE, FERRITIN, TIBC, IRON, RETICCTPCT in the last 72 hours. Sepsis Labs: No results for input(s): PROCALCITON, LATICACIDVEN in the last 168 hours.  Recent Results (from the past 240 hour(s))  MRSA PCR Screening     Status: None   Collection Time: 10/11/18  4:21 PM  Result Value Ref Range Status   MRSA by PCR NEGATIVE NEGATIVE Final    Comment:        The GeneXpert MRSA Assay (FDA approved for NASAL specimens only), is one component of a comprehensive MRSA colonization surveillance program. It is not intended to diagnose MRSA infection nor to guide or monitor treatment for MRSA infections. Performed at [facility], [place] [address], [place], [place] [address]          Radiology Studies: Mr 3d Recon At Scanner  Result Date: 10/12/2018 CLINICAL DATA:  Pancreatitis, atypical presentation/labs, abdominal pain EXAM: MRI ABDOMEN WITHOUT AND WITH CONTRAST (INCLUDING MRCP) TECHNIQUE: Multiplanar multisequence MR imaging of the abdomen was performed both before and after the administration of intravenous contrast. Heavily T2-weighted images of the biliary and pancreatic ducts were obtained, and three-dimensional MRCP images were rendered by post processing. CONTRAST:  8 mL Gadovist IV  COMPARISON:  MRI abdomen dated 09/2018. CT abdomen/pelvis dated 09/27/2018 FINDINGS: Motion degraded images. Lower chest: Lung bases are clear. Hepatobiliary: Mild hepatic steatosis. No suspicious/enhancing hepatic lesions. Status post cholecystectomy. No intrahepatic or extrahepatic ductal dilatation. Pancreas: Diffuse enlargement/abnormality involving the pancreas, now progressively involving the pancreatic body/tail, with peripancreatic inflammatory changes in underlying ductal ectasia. This appearance reflects progression of acute pancreatitis and is considered less compatible with neoplasm given the less focal appearance on the current study. No drainable fluid collection/pseudocyst. No pancreatic necrosis or hematoma. Spleen: Lobulated spleen with sequela prior infarct/trauma, chronic. Adrenals/Urinary Tract:  Adrenal glands are within normal limits. Left renal cortical scarring/atrophy. Bilateral renal cysts, many of which are hemorrhagic, unchanged. No hydronephrosis. Stomach/Bowel: Stomach is within normal limits. Visualized bowel is grossly unremarkable, noting peripancreatic inflammatory changes/fluid along the descending colon. Vascular/Lymphatic:  No evidence of abdominal aortic aneurysm. Small retroperitoneal lymph nodes which do not meet pathologic CT size criteria. Other: Retroperitoneal fluid, as above. Otherwise, no abdominal ascites. Musculoskeletal: No focal osseous lesions. IMPRESSION: Progressive acute pancreatitis, now involving the pancreatic body/tail, with surrounding peripancreatic inflammatory changes and fluid. No pancreatic  necrosis, hemorrhage, or drainable fluid collection/pseudocyst. Given the diffuse appearance on today's study, this is not considered suspicious for neoplasm. Electronically Signed   By: Julian Hy M.D.   On: 10/12/2018 07:21   Mr Abdomen Mrcp Moise Boring Contast  Result Date: 10/12/2018 CLINICAL DATA:  Pancreatitis, atypical presentation/labs, abdominal pain  EXAM: MRI ABDOMEN WITHOUT AND WITH CONTRAST (INCLUDING MRCP) TECHNIQUE: Multiplanar multisequence MR imaging of the abdomen was performed both before and after the administration of intravenous contrast. Heavily T2-weighted images of the biliary and pancreatic ducts were obtained, and three-dimensional MRCP images were rendered by post processing. CONTRAST:  8 mL Gadovist IV COMPARISON:  MRI abdomen dated 09/2018. CT abdomen/pelvis dated 09/27/2018 FINDINGS: Motion degraded images. Lower chest: Lung bases are clear. Hepatobiliary: Mild hepatic steatosis. No suspicious/enhancing hepatic lesions. Status post cholecystectomy. No intrahepatic or extrahepatic ductal dilatation. Pancreas: Diffuse enlargement/abnormality involving the pancreas, now progressively involving the pancreatic body/tail, with peripancreatic inflammatory changes in underlying ductal ectasia. This appearance reflects progression of acute pancreatitis and is considered less compatible with neoplasm given the less focal appearance on the current study. No drainable fluid collection/pseudocyst. No pancreatic necrosis or hematoma. Spleen: Lobulated spleen with sequela prior infarct/trauma, chronic. Adrenals/Urinary Tract:  Adrenal glands are within normal limits. Left renal cortical scarring/atrophy. Bilateral renal cysts, many of which are hemorrhagic, unchanged. No hydronephrosis. Stomach/Bowel: Stomach is within normal limits. Visualized bowel is grossly unremarkable, noting peripancreatic inflammatory changes/fluid along the descending colon. Vascular/Lymphatic:  No evidence of abdominal aortic aneurysm. Small retroperitoneal lymph nodes which do not meet pathologic CT size criteria. Other: Retroperitoneal fluid, as above. Otherwise, no abdominal ascites. Musculoskeletal: No focal osseous lesions. IMPRESSION: Progressive acute pancreatitis, now involving the pancreatic body/tail, with surrounding peripancreatic inflammatory changes and fluid. No  pancreatic necrosis, hemorrhage, or drainable fluid collection/pseudocyst. Given the diffuse appearance on today's study, this is not considered suspicious for neoplasm. Electronically Signed   By: Julian Hy M.D.   On: 10/12/2018 07:21        Scheduled Meds:  amLODipine  10 mg Oral Daily   carvedilol  3.125 mg Oral BID WC   enoxaparin (LOVENOX) injection  40 mg Subcutaneous Q24H   nicotine  21 mg Transdermal Daily   Continuous Infusions:  lactated ringers 200 mL/hr at 10/12/18 0644     LOS: 1 day        Aline August, MD [facility] 10/12/2018, 9:57 AM

## 2018-10-12 NOTE — Progress Notes (Signed)
Pt reported uncontrolled mid abd pain this shift despite administration of morphine 4mg  X 4

## 2018-10-13 LAB — COMPREHENSIVE METABOLIC PANEL
ALT: 16 U/L (ref 0–44)
ANION GAP: 4 — AB (ref 5–15)
AST: 15 U/L (ref 15–41)
Albumin: 2.4 g/dL — ABNORMAL LOW (ref 3.5–5.0)
Alkaline Phosphatase: 52 U/L (ref 38–126)
BUN: 11 mg/dL (ref 6–20)
CO2: 27 mmol/L (ref 22–32)
Calcium: 8.4 mg/dL — ABNORMAL LOW (ref 8.9–10.3)
Chloride: 101 mmol/L (ref 98–111)
Creatinine, Ser: 1.03 mg/dL (ref 0.61–1.24)
GFR calc non Af Amer: >60 mL/min (ref >60)
Glucose, Bld: 85 mg/dL (ref 70–99)
Potassium: 3.7 mmol/L (ref 3.5–5.1)
Sodium: 132 mmol/L — ABNORMAL LOW (ref 135–145)
TOTAL PROTEIN: 5.7 g/dL — AB (ref 6.5–8.1)
Total Bilirubin: 0.7 mg/dL (ref 0.3–1.2)

## 2018-10-13 LAB — CBC WITH DIFFERENTIAL/PLATELET
Abs Immature Granulocytes: 0.05 10*3/uL (ref 0.00–0.07)
Basophils Absolute: 0 10*3/uL (ref 0.0–0.1)
Basophils Relative: 0 %
Eosinophils Absolute: 0.5 10*3/uL (ref 0.0–0.5)
Eosinophils Relative: 5 %
HEMATOCRIT: 36.1 % — AB (ref 39.0–52.0)
Hemoglobin: 11.7 g/dL — ABNORMAL LOW (ref 13.0–17.0)
Immature Granulocytes: 1 %
Lymphocytes Relative: 15 %
Lymphs Abs: 1.7 10*3/uL (ref 0.7–4.0)
MCH: 30.6 pg (ref 26.0–34.0)
MCHC: 32.4 g/dL (ref 30.0–36.0)
MCV: 94.5 fL (ref 80.0–100.0)
Monocytes Absolute: 1.1 10*3/uL — ABNORMAL HIGH (ref 0.1–1.0)
Monocytes Relative: 11 %
Neutro Abs: 7.4 10*3/uL (ref 1.7–7.7)
Neutrophils Relative %: 68 %
Platelets: 356 10*3/uL (ref 150–400)
RBC: 3.82 MIL/uL — ABNORMAL LOW (ref 4.22–5.81)
RDW: 13.9 % (ref 11.5–15.5)
WBC: 10.8 10*3/uL — ABNORMAL HIGH (ref 4.0–10.5)
nRBC: 0 % (ref 0.0–0.2)

## 2018-10-13 LAB — MAGNESIUM: Magnesium: 1.5 mg/dL — ABNORMAL LOW (ref 1.7–2.4)

## 2018-10-13 MED ORDER — POLYETHYLENE GLYCOL 3350 17 G PO PACK
17.0000 g | PACK | Freq: Every day | ORAL | Status: DC
  Administered 2018-10-13: 17 g via ORAL
  Filled 2018-10-13: qty 1

## 2018-10-13 MED ORDER — PRAVASTATIN SODIUM 40 MG PO TABS: 40.0000 mg | ORAL_TABLET | Freq: Every evening | ORAL | 0 refills | Status: DC

## 2018-10-13 MED ORDER — CARVEDILOL 6.25 MG PO TABS: 6.2500 mg | ORAL_TABLET | Freq: Two times a day (BID) | ORAL | 0 refills | Status: DC

## 2018-10-13 MED ORDER — AMLODIPINE BESYLATE 10 MG PO TABS: 10.0000 mg | ORAL_TABLET | Freq: Every day | ORAL | 0 refills | Status: DC

## 2018-10-13 NOTE — Progress Notes (Signed)
Tolerated soft diet earlier this morning-hardly any pain-feels much better-etiology of pancreatitis remains unclear-we will watch for a few hours-but if he continues to tolerate diet well-should be able to discharge home.  He knows that he needs a referral from his PCP to a gastroenterologist with further evaluate why he had pancreatitis.  See discharge summary for further details.

## 2018-10-13 NOTE — Discharge Summary (Signed)
PATIENT DETAILS Name: Paul [name] Age: 53 y.o. Sex: male Date of Birth: [birth year] MRN: [id number]. Admitting Physician: Karmen Bongo, MD WUJ:[id number], Lora Havens, MD  Admit Date: 10/11/2018 Discharge date: 10/13/2018  Recommendations for Outpatient Follow-up:  1. Follow up with PCP in 1-2 weeks 2. Please obtain BMP/CBC in one week 3. Please refer to gastroenterology in the outpatient setting-to reevaluate if patient needs further work-up for pancreatitis of unknown etiology etiology 4. Consider urology evaluation for bilateral renal cysts  Admitted From:  Home  Disposition: [place]: No  Equipment/Devices: None  Discharge Condition: Stable  CODE STATUS: FULL CODE  Diet recommendation:  Heart Healthy-but soft/low-fat diet for the next 1 week.  Brief Summary: See H&P, Labs, Consult and Test reports for all details in brief, patient is a 53 year old with history of hypertension, chronic diastolic heart failure, dyslipidemia-s/p cholecystectomy following episode of pancreatitis this past October 2019-presented with acute pancreatitis.  Brief Hospital Course: Acute pancreatitis: Etiology unclear-patient is s/p cholecystectomy-denies any regular alcohol use-only drinks socially.  Do not see any obvious offending medications.  MRCP of the abdomen without any obvious etiologies.  Managed with supportive care-rapidly improved-tolerated soft diet this morning-hardly any abdominal pain.  Abdomen is very benign on exam.  Since has rapidly improved-we will discharge home-have asked patient to stay on a full liquid/soft diet/low-fat diet for at least 1 week.  Have also asked patient to get a referral from his PCP to a gastroenterologist for further work-up of pancreatitis of unclear etiology.  Hypertension: Controlled-continue Coreg and amlodipine.  Dyslipidemia: Continue Pravachol  Chronic diastolic heart failure: Compensated.  Tobacco abuse: Counseled  Bilateral  renal cysts: Stable for outpatient monitoring.  Per MRCP report-these are unchanged from prior scans.  Procedures/Studies: None  Discharge Diagnoses:  Principal Problem:   Acute pancreatitis Active Problems:   Essential hypertension   Dyslipidemia   Renal lesion   Tobacco abuse   Chronic diastolic CHF (congestive heart failure) (HCC)   CKD (chronic kidney disease) stage 2, GFR 60-89 ml/min   Discharge Instructions:  Activity:  As tolerated with Full fall precautions use walker/cane & assistance as needed   Discharge Instructions    Diet - low sodium heart healthy   Complete by:  As directed    Stay on a soft/low fat diet for 1 week   Discharge instructions   Complete by:  As directed    Follow with Primary MD  [name], MD in 1 week  Stay on a soft/full liquid-but low-fat diet for at least 1 week  Please ask your primary care practitioner to refer you to a gastroenterologist-your cause of pancreatitis was unclear-you may need further work-up in the outpatient setting  Please get a complete blood count and chemistry panel checked by your Primary MD at your next visit, and again as instructed by your Primary MD.  Get Medicines reviewed and adjusted: Please take all your medications with you for your next visit with your Primary MD  Laboratory/radiological data: Please request your Primary MD to go over all hospital tests and procedure/radiological results at the follow up, please ask your Primary MD to get all Hospital records sent to his/her office.  In some cases, they will be blood work, cultures and biopsy results pending at the time of your discharge. Please request that your primary care M.D. follows up on these results.  Also Note the following: If you experience worsening of your admission symptoms, develop shortness of breath, life threatening emergency,  suicidal or homicidal thoughts you must seek medical attention immediately by calling 911 or calling  your MD immediately  if symptoms less severe.  You must read complete instructions/literature along with all the possible adverse reactions/side effects for all the Medicines you take and that have been prescribed to you. Take any new Medicines after you have completely understood and accpet all the possible adverse reactions/side effects.   Do not drive when taking Pain medications or sleeping medications (Benzodaizepines)  Do not take more than prescribed Pain, Sleep and Anxiety Medications. It is not advisable to combine anxiety,sleep and pain medications without talking with your primary care practitioner  Special Instructions: If you have smoked or chewed Tobacco  in the last 2 yrs please stop smoking, stop any regular Alcohol  and or any Recreational drug use.  Wear Seat belts while driving.  Please note: You were cared for by a hospitalist during your hospital stay. Once you are discharged, your primary care physician will handle any further medical issues. Please note that NO REFILLS for any discharge medications will be authorized once you are discharged, as it is imperative that you return to your primary care physician (or establish a relationship with a primary care physician if you do not have one) for your post hospital discharge needs so that they can reassess your need for medications and monitor your lab values.   Increase activity slowly   Complete by:  As directed      Allergies as of 10/13/2018   No Known Allergies     Medication List    STOP taking these medications   acetaminophen 325 MG tablet Commonly known as:  TYLENOL   hydrALAZINE 10 MG tablet Commonly known as:  APRESOLINE   losartan 25 MG tablet Commonly known as:  COZAAR   nicotine 21 mg/24hr patch Commonly known as:  NICODERM CQ - dosed in mg/24 hours   oxyCODONE 5 MG immediate release tablet Commonly known as:  Oxy IR/ROXICODONE     TAKE these medications   amLODipine 10 MG tablet Commonly  known as:  NORVASC Take 1 tablet (10 mg total) by mouth daily for 30 days.   carvedilol 6.25 MG tablet Commonly known as:  COREG Take 1 tablet (6.25 mg total) by mouth 2 (two) times daily with a meal for 30 days. What changed:    medication strength  how much to take   pravastatin 40 MG tablet Commonly known as:  Pravachol Take 1 tablet (40 mg total) by mouth every evening for 30 days.       No Known Allergies  Consultations:   None   Other Procedures/Studies: Mr 3d Recon At Scanner  Result Date: 10/12/2018 CLINICAL DATA:  Pancreatitis, atypical presentation/labs, abdominal pain EXAM: MRI ABDOMEN WITHOUT AND WITH CONTRAST (INCLUDING MRCP) TECHNIQUE: Multiplanar multisequence MR imaging of the abdomen was performed both before and after the administration of intravenous contrast. Heavily T2-weighted images of the biliary and pancreatic ducts were obtained, and three-dimensional MRCP images were rendered by post processing. CONTRAST:  8 mL Gadovist IV COMPARISON:  MRI abdomen dated 09/2018. CT abdomen/pelvis dated 09/27/2018 FINDINGS: Motion degraded images. Lower chest: Lung bases are clear. Hepatobiliary: Mild hepatic steatosis. No suspicious/enhancing hepatic lesions. Status post cholecystectomy. No intrahepatic or extrahepatic ductal dilatation. Pancreas: Diffuse enlargement/abnormality involving the pancreas, now progressively involving the pancreatic body/tail, with peripancreatic inflammatory changes in underlying ductal ectasia. This appearance reflects progression of acute pancreatitis and is considered less compatible with neoplasm given the less focal  appearance on the current study. No drainable fluid collection/pseudocyst. No pancreatic necrosis or hematoma. Spleen: Lobulated spleen with sequela prior infarct/trauma, chronic. Adrenals/Urinary Tract:  Adrenal glands are within normal limits. Left renal cortical scarring/atrophy. Bilateral renal cysts, many of which are  hemorrhagic, unchanged. No hydronephrosis. Stomach/Bowel: Stomach is within normal limits. Visualized bowel is grossly unremarkable, noting peripancreatic inflammatory changes/fluid along the descending colon. Vascular/Lymphatic:  No evidence of abdominal aortic aneurysm. Small retroperitoneal lymph nodes which do not meet pathologic CT size criteria. Other: Retroperitoneal fluid, as above. Otherwise, no abdominal ascites. Musculoskeletal: No focal osseous lesions. IMPRESSION: Progressive acute pancreatitis, now involving the pancreatic body/tail, with surrounding peripancreatic inflammatory changes and fluid. No pancreatic necrosis, hemorrhage, or drainable fluid collection/pseudocyst. Given the diffuse appearance on today's study, this is not considered suspicious for neoplasm. Electronically Signed   By: Julian Hy M.D.   On: 10/12/2018 07:21   Mr Abdomen Mrcp Moise Boring Contast  Result Date: 10/12/2018 CLINICAL DATA:  Pancreatitis, atypical presentation/labs, abdominal pain EXAM: MRI ABDOMEN WITHOUT AND WITH CONTRAST (INCLUDING MRCP) TECHNIQUE: Multiplanar multisequence MR imaging of the abdomen was performed both before and after the administration of intravenous contrast. Heavily T2-weighted images of the biliary and pancreatic ducts were obtained, and three-dimensional MRCP images were rendered by post processing. CONTRAST:  8 mL Gadovist IV COMPARISON:  MRI abdomen dated 09/2018. CT abdomen/pelvis dated 09/27/2018 FINDINGS: Motion degraded images. Lower chest: Lung bases are clear. Hepatobiliary: Mild hepatic steatosis. No suspicious/enhancing hepatic lesions. Status post cholecystectomy. No intrahepatic or extrahepatic ductal dilatation. Pancreas: Diffuse enlargement/abnormality involving the pancreas, now progressively involving the pancreatic body/tail, with peripancreatic inflammatory changes in underlying ductal ectasia. This appearance reflects progression of acute pancreatitis and is considered  less compatible with neoplasm given the less focal appearance on the current study. No drainable fluid collection/pseudocyst. No pancreatic necrosis or hematoma. Spleen: Lobulated spleen with sequela prior infarct/trauma, chronic. Adrenals/Urinary Tract:  Adrenal glands are within normal limits. Left renal cortical scarring/atrophy. Bilateral renal cysts, many of which are hemorrhagic, unchanged. No hydronephrosis. Stomach/Bowel: Stomach is within normal limits. Visualized bowel is grossly unremarkable, noting peripancreatic inflammatory changes/fluid along the descending colon. Vascular/Lymphatic:  No evidence of abdominal aortic aneurysm. Small retroperitoneal lymph nodes which do not meet pathologic CT size criteria. Other: Retroperitoneal fluid, as above. Otherwise, no abdominal ascites. Musculoskeletal: No focal osseous lesions. IMPRESSION: Progressive acute pancreatitis, now involving the pancreatic body/tail, with surrounding peripancreatic inflammatory changes and fluid. No pancreatic necrosis, hemorrhage, or drainable fluid collection/pseudocyst. Given the diffuse appearance on today's study, this is not considered suspicious for neoplasm. Electronically Signed   By: Julian Hy M.D.   On: 10/12/2018 07:21      TODAY-DAY OF DISCHARGE:  Subjective:   Paul [name] today has no headache,no chest abdominal pain,no new weakness tingling or numbness, feels much better wants to go home today.   Objective:   Blood pressure (!) 149/90, pulse 72, temperature 98.5 F (36.9 C), temperature source Oral, resp. rate 17, height 6' (1.829 m), weight 83.9 kg, SpO2 96 %.  Intake/Output Summary (Last 24 hours) at 10/13/2018 1132 Last data filed at 10/13/2018 0981 Gross per 24 hour  Intake 3708.39 ml  Output 950 ml  Net 2758.39 ml   Filed Weights   10/11/18 0848  Weight: 83.9 kg    Exam: Awake Alert, Oriented *3, No new F.N deficits, Normal affect [place].AT,PERRAL Supple Neck,No JVD, No cervical  lymphadenopathy appriciated.  Symmetrical Chest wall movement, Good air movement bilaterally, CTAB RRR,No Gallops,Rubs or new Murmurs,  No Parasternal Heave +ve B.Sounds, Abd Soft, Non tender, No organomegaly appriciated, No rebound -guarding or rigidity. No Cyanosis, Clubbing or edema, No new Rash or bruise   PERTINENT RADIOLOGIC STUDIES: Mr 3d Recon At Scanner  Result Date: 10/12/2018 CLINICAL DATA:  Pancreatitis, atypical presentation/labs, abdominal pain EXAM: MRI ABDOMEN WITHOUT AND WITH CONTRAST (INCLUDING MRCP) TECHNIQUE: Multiplanar multisequence MR imaging of the abdomen was performed both before and after the administration of intravenous contrast. Heavily T2-weighted images of the biliary and pancreatic ducts were obtained, and three-dimensional MRCP images were rendered by post processing. CONTRAST:  8 mL Gadovist IV COMPARISON:  MRI abdomen dated 09/2018. CT abdomen/pelvis dated 09/27/2018 FINDINGS: Motion degraded images. Lower chest: Lung bases are clear. Hepatobiliary: Mild hepatic steatosis. No suspicious/enhancing hepatic lesions. Status post cholecystectomy. No intrahepatic or extrahepatic ductal dilatation. Pancreas: Diffuse enlargement/abnormality involving the pancreas, now progressively involving the pancreatic body/tail, with peripancreatic inflammatory changes in underlying ductal ectasia. This appearance reflects progression of acute pancreatitis and is considered less compatible with neoplasm given the less focal appearance on the current study. No drainable fluid collection/pseudocyst. No pancreatic necrosis or hematoma. Spleen: Lobulated spleen with sequela prior infarct/trauma, chronic. Adrenals/Urinary Tract:  Adrenal glands are within normal limits. Left renal cortical scarring/atrophy. Bilateral renal cysts, many of which are hemorrhagic, unchanged. No hydronephrosis. Stomach/Bowel: Stomach is within normal limits. Visualized bowel is grossly unremarkable, noting  peripancreatic inflammatory changes/fluid along the descending colon. Vascular/Lymphatic:  No evidence of abdominal aortic aneurysm. Small retroperitoneal lymph nodes which do not meet pathologic CT size criteria. Other: Retroperitoneal fluid, as above. Otherwise, no abdominal ascites. Musculoskeletal: No focal osseous lesions. IMPRESSION: Progressive acute pancreatitis, now involving the pancreatic body/tail, with surrounding peripancreatic inflammatory changes and fluid. No pancreatic necrosis, hemorrhage, or drainable fluid collection/pseudocyst. Given the diffuse appearance on today's study, this is not considered suspicious for neoplasm. Electronically Signed   By: Julian Hy M.D.   On: 10/12/2018 07:21   Mr Abdomen Mrcp Moise Boring Contast  Result Date: 10/12/2018 CLINICAL DATA:  Pancreatitis, atypical presentation/labs, abdominal pain EXAM: MRI ABDOMEN WITHOUT AND WITH CONTRAST (INCLUDING MRCP) TECHNIQUE: Multiplanar multisequence MR imaging of the abdomen was performed both before and after the administration of intravenous contrast. Heavily T2-weighted images of the biliary and pancreatic ducts were obtained, and three-dimensional MRCP images were rendered by post processing. CONTRAST:  8 mL Gadovist IV COMPARISON:  MRI abdomen dated 09/2018. CT abdomen/pelvis dated 09/27/2018 FINDINGS: Motion degraded images. Lower chest: Lung bases are clear. Hepatobiliary: Mild hepatic steatosis. No suspicious/enhancing hepatic lesions. Status post cholecystectomy. No intrahepatic or extrahepatic ductal dilatation. Pancreas: Diffuse enlargement/abnormality involving the pancreas, now progressively involving the pancreatic body/tail, with peripancreatic inflammatory changes in underlying ductal ectasia. This appearance reflects progression of acute pancreatitis and is considered less compatible with neoplasm given the less focal appearance on the current study. No drainable fluid collection/pseudocyst. No pancreatic  necrosis or hematoma. Spleen: Lobulated spleen with sequela prior infarct/trauma, chronic. Adrenals/Urinary Tract:  Adrenal glands are within normal limits. Left renal cortical scarring/atrophy. Bilateral renal cysts, many of which are hemorrhagic, unchanged. No hydronephrosis. Stomach/Bowel: Stomach is within normal limits. Visualized bowel is grossly unremarkable, noting peripancreatic inflammatory changes/fluid along the descending colon. Vascular/Lymphatic:  No evidence of abdominal aortic aneurysm. Small retroperitoneal lymph nodes which do not meet pathologic CT size criteria. Other: Retroperitoneal fluid, as above. Otherwise, no abdominal ascites. Musculoskeletal: No focal osseous lesions. IMPRESSION: Progressive acute pancreatitis, now involving the pancreatic body/tail, with surrounding peripancreatic inflammatory changes and fluid. No pancreatic necrosis, hemorrhage, or  drainable fluid collection/pseudocyst. Given the diffuse appearance on today's study, this is not considered suspicious for neoplasm. Electronically Signed   By: Julian Hy M.D.   On: 10/12/2018 07:21     PERTINENT LAB RESULTS: CBC: Recent Labs    10/12/18 0340 10/13/18 0334  WBC 13.0* 10.8*  HGB 14.5 11.7*  HCT 42.5 36.1*  PLT 450* 356   CMET CMP     Component Value Date/Time   NA 132 (L) 10/13/2018 0334   K 3.7 10/13/2018 0334   CL 101 10/13/2018 0334   CO2 27 10/13/2018 0334   GLUCOSE 85 10/13/2018 0334   BUN 11 10/13/2018 0334   CREATININE 1.03 10/13/2018 0334   CALCIUM 8.4 (L) 10/13/2018 0334   CALCIUM 9.7 05/21/2018 0419   PROT 5.7 (L) 10/13/2018 0334   ALBUMIN 2.4 (L) 10/13/2018 0334   AST 15 10/13/2018 0334   ALT 16 10/13/2018 0334   ALKPHOS 52 10/13/2018 0334   BILITOT 0.7 10/13/2018 0334   GFRNONAA >60 10/13/2018 0334   GFRAA >60 10/13/2018 0334    GFR Estimated Creatinine Clearance: 94.2 mL/min (by C-G formula based on SCr of 1.03 mg/dL). Recent Labs    10/11/18 0858  LIPASE  1,443*   No results for input(s): CKTOTAL, CKMB, CKMBINDEX, TROPONINI in the last 72 hours. Invalid input(s): POCBNP No results for input(s): DDIMER in the last 72 hours. No results for input(s): HGBA1C in the last 72 hours. Recent Labs    10/12/18 0340  CHOL 127  HDL 27*  LDLCALC 77  TRIG 114  CHOLHDL 4.7   No results for input(s): TSH, T4TOTAL, T3FREE, THYROIDAB in the last 72 hours.  Invalid input(s): FREET3 No results for input(s): VITAMINB12, FOLATE, FERRITIN, TIBC, IRON, RETICCTPCT in the last 72 hours. Coags: No results for input(s): INR in the last 72 hours.  Invalid input(s): PT Microbiology: Recent Results (from the past 240 hour(s))  MRSA PCR Screening     Status: None   Collection Time: 10/11/18  4:21 PM  Result Value Ref Range Status   MRSA by PCR NEGATIVE NEGATIVE Final    Comment:        The GeneXpert MRSA Assay (FDA approved for NASAL specimens only), is one component of a comprehensive MRSA colonization surveillance program. It is not intended to diagnose MRSA infection nor to guide or monitor treatment for MRSA infections. Performed at [facility], [place] [address], [place], [place] [address]     FURTHER DISCHARGE INSTRUCTIONS:  Get Medicines reviewed and adjusted: Please take all your medications with you for your next visit with your Primary MD  Laboratory/radiological data: Please request your Primary MD to go over all hospital tests and procedure/radiological results at the follow up, please ask your Primary MD to get all Hospital records sent to his/her office.  In some cases, they will be blood work, cultures and biopsy results pending at the time of your discharge. Please request that your primary care M.D. goes through all the records of your hospital data and follows up on these results.  Also Note the following: If you experience worsening of your admission symptoms, develop shortness of breath, life threatening emergency,  suicidal or homicidal thoughts you must seek medical attention immediately by calling 911 or calling your MD immediately  if symptoms less severe.  You must read complete instructions/literature along with all the possible adverse reactions/side effects for all the Medicines you take and that have been prescribed to you. Take any new Medicines  after you have completely understood and accpet all the possible adverse reactions/side effects.   Do not drive when taking Pain medications or sleeping medications (Benzodaizepines)  Do not take more than prescribed Pain, Sleep and Anxiety Medications. It is not advisable to combine anxiety,sleep and pain medications without talking with your primary care practitioner  Special Instructions: If you have smoked or chewed Tobacco  in the last 2 yrs please stop smoking, stop any regular Alcohol  and or any Recreational drug use.  Wear Seat belts while driving.  Please note: You were cared for by a hospitalist during your hospital stay. Once you are discharged, your primary care physician will handle any further medical issues. Please note that NO REFILLS for any discharge medications will be authorized once you are discharged, as it is imperative that you return to your primary care physician (or establish a relationship with a primary care physician if you do not have one) for your post hospital discharge needs so that they can reassess your need for medications and monitor your lab values.  Total Time spent coordinating discharge including counseling, education and face to face time equals 35 minutes.  SignedOren Binet 10/13/2018 11:32 AM

## 2018-10-13 NOTE — Discharge Instructions (Signed)
Pancreatitis Eating Plan  Pancreatitis is when your pancreas becomes irritated and swollen (inflamed). The pancreas is a small organ located behind your stomach. It helps your body digest food and regulate your blood sugar. Pancreatitis can affect how your body digests food, especially foods with fat. You may also have other symptoms such as abdominal pain or nausea.  When you have pancreatitis, following a low-fat eating plan may help you manage symptoms and recover more quickly. Work with your health care provider or a diet and nutrition specialist (dietitian) to create an eating plan that is right for you.  What are tips for following this plan?  Reading food labels  Use the information on food labels to help keep track of how much fat you eat:   Check the serving size.   Look for the amount of total fat in grams (g) in one serving.  ? Low-fat foods have 3 g of fat or less per serving.  ? Fat-free foods have 0.5 g of fat or less per serving.   Keep track of how much fat you eat based on how many servings you eat.  ? For example, if you eat two servings, the amount of fat you eat will be two times what is listed on the label.  Shopping     Buy low-fat or nonfat foods, such as:  ? Fresh, frozen, or canned fruits and vegetables.  ? Grains, including pasta, bread, and rice.  ? Lean meat, poultry, fish, and other protein foods.  ? Low-fat or nonfat dairy.   Avoid buying bakery products and other sweets made with whole milk, butter, and eggs.   Avoid buying snack foods with added fat, such as anything with butter or cheese flavoring.  Cooking   Remove skin from poultry, and remove extra fat from meat.   Limit the amount of fat and oil you use to 6 teaspoons or less per day.   Cook using low-fat methods, such as boiling, broiling, grilling, steaming, or baking.   Use spray oil to cook. Add fat-free chicken broth to add flavor and moisture.   Avoid adding cream to thicken soups or sauces. Use other thickeners  such as corn starch or tomato paste.  Meal planning     Eat a low-fat diet as told by your dietitian. For most people, this means having no more than 55-65 grams of fat each day.   Eat small, frequent meals throughout the day. For example, you may have 5-6 small meals instead of 3 large meals.   Drink enough fluid to keep your urine pale yellow.   Do not drink alcohol. Talk to your health care provider if you need help stopping.   Limit how much caffeine you have, including black coffee, black and green tea, caffeinated soft drinks, and energy drinks.  General information   Let your health care provider or dietitian know if you have unplanned weight loss on this eating plan.   You may be instructed to follow a clear liquid diet during a flare of symptoms. Talk with your health care provider about how to manage your diet during symptoms of a flare.   Take any vitamins or supplements as told by your health care provider.   Work with a dietitian, especially if you have other conditions such as obesity or diabetes mellitus.  What foods should I avoid?  Fruits  Fried fruits. Fruits served with butter or cream.  Vegetables  Fried vegetables. Vegetables cooked with butter, cheese, or   cream.  Grains  Biscuits, waffles, donuts, pastries, and croissants. Pies and cookies. Butter-flavored popcorn. Regular crackers.  Meats and other protein foods  Fatty cuts of meat. Poultry with skin. Organ meats. Bacon, sausage, and cold cuts. Whole eggs. Nuts and nut butters.  Dairy  Whole and 2% milk. Whole milk yogurt. Whole milk ice cream. Cream and half-and-half. Cream cheese. Sour cream. Cheese.  Beverages  Wine, beer, and liquor.  The items listed above may not be a complete list of foods and beverages to avoid. Contact a dietitian for more information.  Summary   Pancreatitis can affect how your body digests food, especially foods with fat.   When you have pancreatitis, it is recommended that you follow a low-fat eating  plan to help you recover more quickly and manage symptoms. For most people, this means limiting fat to no more than 55-65 grams per day.   Do not drink alcohol. Limit the amount of caffeine you have, and drink enough fluid to keep your urine pale yellow.  This information is not intended to replace advice given to you by your health care provider. Make sure you discuss any questions you have with your health care provider.  Document Released: 10/20/2017 Document Revised: 10/20/2017 Document Reviewed: 10/20/2017  Elsevier Interactive Patient Education  2019 [employer] Inc[employer]

## 2018-10-13 NOTE — Progress Notes (Signed)
Pt given all discharge instructions and med info with understanding verbalized.  Pt has his 3 hard copy prescriptions and understands he must fill those at his pharmacy today.  Pt discharged home with all belongings.

## 2018-11-04 ENCOUNTER — Inpatient Hospital Stay (HOSPITAL_COMMUNITY)
Admission: EM | Admit: 2018-11-04 | Discharge: 2018-11-06 | DRG: 439 | Disposition: A | Discharge status: Home / Self Care | Payer: Self-pay | Attending: Internal Medicine | Admitting: Internal Medicine

## 2018-11-04 ENCOUNTER — Other Ambulatory Visit: Payer: Self-pay

## 2018-11-04 ENCOUNTER — Encounter (HOSPITAL_COMMUNITY): Payer: Self-pay

## 2018-11-04 DIAGNOSIS — Z79899 Other long term (current) drug therapy: Secondary | ICD-10-CM

## 2018-11-04 DIAGNOSIS — K861 Other chronic pancreatitis: Secondary | ICD-10-CM | POA: Diagnosis present

## 2018-11-04 DIAGNOSIS — I130 Hypertensive heart and chronic kidney disease with heart failure and stage 1 through stage 4 chronic kidney disease, or unspecified chronic kidney disease: Secondary | ICD-10-CM | POA: Diagnosis present

## 2018-11-04 DIAGNOSIS — I5032 Chronic diastolic (congestive) heart failure: Secondary | ICD-10-CM | POA: Diagnosis present

## 2018-11-04 DIAGNOSIS — Z720 Tobacco use: Secondary | ICD-10-CM | POA: Diagnosis present

## 2018-11-04 DIAGNOSIS — F17210 Nicotine dependence, cigarettes, uncomplicated: Secondary | ICD-10-CM | POA: Diagnosis present

## 2018-11-04 DIAGNOSIS — I161 Hypertensive emergency: Secondary | ICD-10-CM | POA: Diagnosis present

## 2018-11-04 DIAGNOSIS — I252 Old myocardial infarction: Secondary | ICD-10-CM

## 2018-11-04 DIAGNOSIS — R0789 Other chest pain: Secondary | ICD-10-CM | POA: Diagnosis present

## 2018-11-04 DIAGNOSIS — N182 Chronic kidney disease, stage 2 (mild): Secondary | ICD-10-CM | POA: Diagnosis present

## 2018-11-04 DIAGNOSIS — I10 Essential (primary) hypertension: Secondary | ICD-10-CM | POA: Diagnosis present

## 2018-11-04 DIAGNOSIS — K859 Acute pancreatitis without necrosis or infection, unspecified: Principal | ICD-10-CM | POA: Diagnosis present

## 2018-11-04 DIAGNOSIS — Z9049 Acquired absence of other specified parts of digestive tract: Secondary | ICD-10-CM

## 2018-11-04 DIAGNOSIS — N183 Chronic kidney disease, stage 3 unspecified: Secondary | ICD-10-CM | POA: Diagnosis present

## 2018-11-04 DIAGNOSIS — E785 Hyperlipidemia, unspecified: Secondary | ICD-10-CM | POA: Diagnosis present

## 2018-11-04 LAB — CBC WITH DIFFERENTIAL/PLATELET
Abs Immature Granulocytes: 0.02 10*3/uL (ref 0.00–0.07)
Basophils Absolute: 0 10*3/uL (ref 0.0–0.1)
Basophils Relative: 0 %
Eosinophils Absolute: 0.3 10*3/uL (ref 0.0–0.5)
Eosinophils Relative: 3 %
HCT: 46.4 % (ref 39.0–52.0)
Hemoglobin: 14.8 g/dL (ref 13.0–17.0)
Immature Granulocytes: 0 %
Lymphocytes Relative: 27 %
Lymphs Abs: 2.9 10*3/uL (ref 0.7–4.0)
MCH: 30.1 pg (ref 26.0–34.0)
MCHC: 31.9 g/dL (ref 30.0–36.0)
MCV: 94.3 fL (ref 80.0–100.0)
Monocytes Absolute: 0.9 10*3/uL (ref 0.1–1.0)
Monocytes Relative: 9 %
Neutro Abs: 6.6 10*3/uL (ref 1.7–7.7)
Neutrophils Relative %: 61 %
Platelets: 463 10*3/uL — ABNORMAL HIGH (ref 150–400)
RBC: 4.92 MIL/uL (ref 4.22–5.81)
RDW: 13.9 % (ref 11.5–15.5)
WBC: 10.7 10*3/uL — ABNORMAL HIGH (ref 4.0–10.5)
nRBC: 0 % (ref 0.0–0.2)

## 2018-11-04 LAB — PHOSPHORUS: Phosphorus: 3.5 mg/dL (ref 2.5–4.6)

## 2018-11-04 LAB — COMPREHENSIVE METABOLIC PANEL
ALT: 20 U/L (ref 0–44)
AST: 18 U/L (ref 15–41)
Albumin: 3.9 g/dL (ref 3.5–5.0)
Alkaline Phosphatase: 65 U/L (ref 38–126)
Anion gap: 13 (ref 5–15)
BUN: 8 mg/dL (ref 6–20)
CO2: 26 mmol/L (ref 22–32)
Calcium: 9.9 mg/dL (ref 8.9–10.3)
Chloride: 99 mmol/L (ref 98–111)
Creatinine, Ser: 1.15 mg/dL (ref 0.61–1.24)
GFR calc Af Amer: >60 mL/min (ref >60)
GFR calc non Af Amer: >60 mL/min (ref >60)
Glucose, Bld: 149 mg/dL — ABNORMAL HIGH (ref 70–99)
Potassium: 4 mmol/L (ref 3.5–5.1)
Sodium: 138 mmol/L (ref 135–145)
Total Bilirubin: 0.5 mg/dL (ref 0.3–1.2)
Total Protein: 7.7 g/dL (ref 6.5–8.1)

## 2018-11-04 LAB — TROPONIN I
Troponin I: <0.03 ng/mL (ref <0.03)
Troponin I: <0.03 ng/mL (ref <0.03)

## 2018-11-04 LAB — LIPASE, BLOOD: Lipase: 1708 U/L — ABNORMAL HIGH (ref 11–51)

## 2018-11-04 LAB — BRAIN NATRIURETIC PEPTIDE: B Natriuretic Peptide: 95.7 pg/mL (ref 0.0–100.0)

## 2018-11-04 LAB — MAGNESIUM: Magnesium: 2 mg/dL (ref 1.7–2.4)

## 2018-11-04 MED ORDER — KETOROLAC TROMETHAMINE 30 MG/ML IJ SOLN
30.0000 mg | Freq: Once | INTRAMUSCULAR | Status: AC
  Administered 2018-11-04: 30 mg via INTRAVENOUS
  Filled 2018-11-04: qty 1

## 2018-11-04 MED ORDER — LABETALOL HCL 5 MG/ML IV SOLN
20.0000 mg | INTRAVENOUS | Status: DC | PRN
  Administered 2018-11-04: 20 mg via INTRAVENOUS
  Filled 2018-11-04: qty 4

## 2018-11-04 MED ORDER — ONDANSETRON HCL 4 MG/2ML IJ SOLN
4.0000 mg | Freq: Once | INTRAMUSCULAR | Status: AC
  Administered 2018-11-04: 4 mg via INTRAVENOUS
  Filled 2018-11-04: qty 2

## 2018-11-04 MED ORDER — METOPROLOL TARTRATE 5 MG/5ML IV SOLN
5.0000 mg | Freq: Four times a day (QID) | INTRAVENOUS | Status: DC
  Administered 2018-11-04 – 2018-11-05 (×4): 5 mg via INTRAVENOUS
  Filled 2018-11-04 (×4): qty 5

## 2018-11-04 MED ORDER — FAMOTIDINE 20 MG IN NS 100 ML IVPB
20.0000 mg | Freq: Two times a day (BID) | INTRAVENOUS | Status: DC
  Administered 2018-11-04 – 2018-11-06 (×5): 20 mg via INTRAVENOUS
  Filled 2018-11-04 (×5): qty 100

## 2018-11-04 MED ORDER — SODIUM CHLORIDE 0.9 % IV BOLUS
1000.0000 mL | Freq: Once | INTRAVENOUS | Status: AC
  Administered 2018-11-04: 07:00:00 1000 mL via INTRAVENOUS

## 2018-11-04 MED ORDER — HYDROMORPHONE HCL 1 MG/ML IJ SOLN
1.0000 mg | Freq: Once | INTRAMUSCULAR | Status: AC
  Administered 2018-11-04: 07:00:00 1 mg via INTRAVENOUS
  Filled 2018-11-04: qty 1

## 2018-11-04 MED ORDER — SODIUM CHLORIDE 0.9 % IV SOLN
Freq: Once | INTRAVENOUS | Status: AC
  Administered 2018-11-04: 12:00:00 via INTRAVENOUS

## 2018-11-04 MED ORDER — SODIUM CHLORIDE 0.9 % IV SOLN
INTRAVENOUS | Status: DC | PRN
  Administered 2018-11-04: 250 mL via INTRAVENOUS

## 2018-11-04 MED ORDER — HYDROMORPHONE HCL 1 MG/ML IJ SOLN
1.0000 mg | Freq: Once | INTRAMUSCULAR | Status: AC
  Administered 2018-11-04: 1 mg via INTRAVENOUS
  Filled 2018-11-04: qty 1

## 2018-11-04 MED ORDER — HYDRALAZINE HCL 20 MG/ML IJ SOLN
15.0000 mg | Freq: Once | INTRAMUSCULAR | Status: AC
  Administered 2018-11-04: 15 mg via INTRAVENOUS
  Filled 2018-11-04: qty 1

## 2018-11-04 MED ORDER — ONDANSETRON HCL 4 MG/2ML IJ SOLN
4.0000 mg | Freq: Four times a day (QID) | INTRAMUSCULAR | Status: DC | PRN
  Filled 2018-11-04: qty 2

## 2018-11-04 MED ORDER — HYDROMORPHONE HCL 1 MG/ML IJ SOLN
1.0000 mg | INTRAMUSCULAR | Status: DC | PRN
  Administered 2018-11-04 – 2018-11-06 (×9): 1 mg via INTRAVENOUS
  Filled 2018-11-04 (×10): qty 1

## 2018-11-04 MED ORDER — POTASSIUM CHLORIDE IN NACL 20-0.45 MEQ/L-% IV SOLN
INTRAVENOUS | Status: DC
  Administered 2018-11-04: 15:00:00 1000 mL via INTRAVENOUS
  Administered 2018-11-05 (×2): via INTRAVENOUS
  Filled 2018-11-04 (×5): qty 1000

## 2018-11-04 MED ORDER — HYDROMORPHONE HCL 1 MG/ML IJ SOLN
1.0000 mg | Freq: Once | INTRAMUSCULAR | Status: AC
  Administered 2018-11-04: 12:00:00 1 mg via INTRAVENOUS
  Filled 2018-11-04: qty 1

## 2018-11-04 MED ORDER — ONDANSETRON HCL 4 MG PO TABS: 4.0000 mg | ORAL_TABLET | Freq: Four times a day (QID) | ORAL | Status: DC | PRN

## 2018-11-04 MED ORDER — ALUM & MAG HYDROXIDE-SIMETH 200-200-20 MG/5ML PO SUSP
30.0000 mL | Freq: Once | ORAL | Status: AC
  Administered 2018-11-04: 07:00:00 30 mL via ORAL
  Filled 2018-11-04: qty 30

## 2018-11-04 MED ORDER — HYDRALAZINE HCL 20 MG/ML IJ SOLN
20.0000 mg | INTRAMUSCULAR | Status: DC | PRN
  Administered 2018-11-04 – 2018-11-05 (×3): 20 mg via INTRAVENOUS
  Filled 2018-11-04 (×3): qty 1

## 2018-11-04 NOTE — ED Notes (Signed)
Lab stated they have to rerun the Lipase, said they are working on it now, did not say how long that would take.

## 2018-11-04 NOTE — ED Notes (Signed)
Attempted report 

## 2018-11-04 NOTE — ED Notes (Signed)
Pt's O2 sats observed to be in the lower 80's on room air; pt placed on 2L O2 via ; O2 sats now in the 100%

## 2018-11-04 NOTE — ED Notes (Signed)
Dr. Melina Copa notified of pt's continued HTN and pain
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
## 2018-11-04 NOTE — ED Notes (Signed)
Pt given ice chips per Dr. Leonette Monarch

## 2018-11-04 NOTE — ED Triage Notes (Signed)
Pt arrived with c/o pain on entire left side; pt states that it feels like he is getting "stabbed" in LLQ; Pt states that he has pain in L clavicle area and these pains are un explained as he was only sitting when the onset of pains occurred.

## 2018-11-04 NOTE — Progress Notes (Signed)
Patient trasfered from ED to [phone] via stretcher; alert and oriented x 4; complaints of pain in LLQ (*/10) - MD notified; IV saline locked in LAC running fluid @ 131ml/hr; skin intact. Orient patient to room and unit; gave patient care guide; instructed how to use the call bell and  fall risk precautions. Will continue to monitor the patient.
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
## 2018-11-04 NOTE — ED Notes (Signed)
This rn called lab again about lipase, will call this rn with results
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
## 2018-11-05 DIAGNOSIS — E785 Hyperlipidemia, unspecified: Secondary | ICD-10-CM

## 2018-11-05 DIAGNOSIS — N182 Chronic kidney disease, stage 2 (mild): Secondary | ICD-10-CM

## 2018-11-05 DIAGNOSIS — Z720 Tobacco use: Secondary | ICD-10-CM

## 2018-11-05 DIAGNOSIS — I10 Essential (primary) hypertension: Secondary | ICD-10-CM

## 2018-11-05 DIAGNOSIS — I5032 Chronic diastolic (congestive) heart failure: Secondary | ICD-10-CM

## 2018-11-05 DIAGNOSIS — R0789 Other chest pain: Secondary | ICD-10-CM

## 2018-11-05 LAB — CBC WITH DIFFERENTIAL/PLATELET
Abs Immature Granulocytes: 0.03 10*3/uL (ref 0.00–0.07)
Basophils Absolute: 0 10*3/uL (ref 0.0–0.1)
Basophils Relative: 0 %
Eosinophils Absolute: 0 10*3/uL (ref 0.0–0.5)
Eosinophils Relative: 0 %
HCT: 43.6 % (ref 39.0–52.0)
Hemoglobin: 14.6 g/dL (ref 13.0–17.0)
Immature Granulocytes: 0 %
Lymphocytes Relative: 7 %
Lymphs Abs: 0.9 10*3/uL (ref 0.7–4.0)
MCH: 30.9 pg (ref 26.0–34.0)
MCHC: 33.5 g/dL (ref 30.0–36.0)
MCV: 92.2 fL (ref 80.0–100.0)
Monocytes Absolute: 1.2 10*3/uL — ABNORMAL HIGH (ref 0.1–1.0)
Monocytes Relative: 9 %
Neutro Abs: 11.1 10*3/uL — ABNORMAL HIGH (ref 1.7–7.7)
Neutrophils Relative %: 84 %
Platelets: 397 10*3/uL (ref 150–400)
RBC: 4.73 MIL/uL (ref 4.22–5.81)
RDW: 14.1 % (ref 11.5–15.5)
WBC: 13.2 10*3/uL — ABNORMAL HIGH (ref 4.0–10.5)
nRBC: 0 % (ref 0.0–0.2)

## 2018-11-05 LAB — COMPREHENSIVE METABOLIC PANEL
ALT: 13 U/L (ref 0–44)
AST: 13 U/L — ABNORMAL LOW (ref 15–41)
Albumin: 3.2 g/dL — ABNORMAL LOW (ref 3.5–5.0)
Alkaline Phosphatase: 53 U/L (ref 38–126)
Anion gap: 10 (ref 5–15)
BUN: 16 mg/dL (ref 6–20)
CO2: 22 mmol/L (ref 22–32)
Calcium: 9.1 mg/dL (ref 8.9–10.3)
Chloride: 103 mmol/L (ref 98–111)
Creatinine, Ser: 1.02 mg/dL (ref 0.61–1.24)
GFR calc Af Amer: >60 mL/min (ref >60)
GFR calc non Af Amer: >60 mL/min (ref >60)
Glucose, Bld: 130 mg/dL — ABNORMAL HIGH (ref 70–99)
Potassium: 4.6 mmol/L (ref 3.5–5.1)
Sodium: 135 mmol/L (ref 135–145)
Total Bilirubin: 0.9 mg/dL (ref 0.3–1.2)
Total Protein: 6.5 g/dL (ref 6.5–8.1)

## 2018-11-05 LAB — LIPASE, BLOOD: Lipase: 689 U/L — ABNORMAL HIGH (ref 11–51)

## 2018-11-05 LAB — TROPONIN I: Troponin I: <0.03 ng/mL (ref <0.03)

## 2018-11-05 MED ORDER — HYDRALAZINE HCL 20 MG/ML IJ SOLN: 10.0000 mg | INTRAMUSCULAR | Status: DC | PRN

## 2018-11-05 MED ORDER — HYDRALAZINE HCL 50 MG PO TABS
50.0000 mg | ORAL_TABLET | Freq: Three times a day (TID) | ORAL | Status: DC
  Administered 2018-11-05 – 2018-11-06 (×4): 50 mg via ORAL
  Filled 2018-11-05 (×4): qty 1

## 2018-11-05 MED ORDER — AMLODIPINE BESYLATE 10 MG PO TABS
10.0000 mg | ORAL_TABLET | Freq: Every day | ORAL | Status: DC
  Administered 2018-11-05 – 2018-11-06 (×2): 10 mg via ORAL
  Filled 2018-11-05 (×2): qty 1

## 2018-11-05 MED ORDER — AMLODIPINE BESYLATE 10 MG PO TABS: 10.0000 mg | ORAL_TABLET | Freq: Every day | ORAL | Status: DC

## 2018-11-05 MED ORDER — OXYCODONE HCL 5 MG PO TABS
5.0000 mg | ORAL_TABLET | ORAL | Status: DC | PRN
  Administered 2018-11-05 – 2018-11-06 (×3): 5 mg via ORAL
  Filled 2018-11-05 (×4): qty 1

## 2018-11-05 MED ORDER — CARVEDILOL 6.25 MG PO TABS
6.2500 mg | ORAL_TABLET | Freq: Two times a day (BID) | ORAL | Status: DC
  Administered 2018-11-05 – 2018-11-06 (×2): 6.25 mg via ORAL
  Filled 2018-11-05 (×2): qty 1
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
## 2018-11-06 LAB — COMPREHENSIVE METABOLIC PANEL
ALT: 12 U/L (ref 0–44)
AST: 15 U/L (ref 15–41)
Albumin: 2.7 g/dL — ABNORMAL LOW (ref 3.5–5.0)
Alkaline Phosphatase: 57 U/L (ref 38–126)
Anion gap: 11 (ref 5–15)
BUN: 19 mg/dL (ref 6–20)
CO2: 26 mmol/L (ref 22–32)
Calcium: 9.2 mg/dL (ref 8.9–10.3)
Chloride: 97 mmol/L — ABNORMAL LOW (ref 98–111)
Creatinine, Ser: 1.18 mg/dL (ref 0.61–1.24)
GFR calc Af Amer: >60 mL/min (ref >60)
GFR calc non Af Amer: >60 mL/min (ref >60)
Glucose, Bld: 100 mg/dL — ABNORMAL HIGH (ref 70–99)
Potassium: 4.7 mmol/L (ref 3.5–5.1)
Sodium: 134 mmol/L — ABNORMAL LOW (ref 135–145)
Total Bilirubin: 1.1 mg/dL (ref 0.3–1.2)
Total Protein: 6.1 g/dL — ABNORMAL LOW (ref 6.5–8.1)

## 2018-11-06 LAB — CBC WITH DIFFERENTIAL/PLATELET
Abs Immature Granulocytes: 0.17 10*3/uL — ABNORMAL HIGH (ref 0.00–0.07)
Basophils Absolute: 0 10*3/uL (ref 0.0–0.1)
Basophils Relative: 0 %
Eosinophils Absolute: 0.1 10*3/uL (ref 0.0–0.5)
Eosinophils Relative: 0 %
HCT: 43.3 % (ref 39.0–52.0)
Hemoglobin: 14 g/dL (ref 13.0–17.0)
Immature Granulocytes: 1 %
Lymphocytes Relative: 6 %
Lymphs Abs: 1.1 10*3/uL (ref 0.7–4.0)
MCH: 30.2 pg (ref 26.0–34.0)
MCHC: 32.3 g/dL (ref 30.0–36.0)
MCV: 93.5 fL (ref 80.0–100.0)
Monocytes Absolute: 1.7 10*3/uL — ABNORMAL HIGH (ref 0.1–1.0)
Monocytes Relative: 9 %
Neutro Abs: 16.4 10*3/uL — ABNORMAL HIGH (ref 1.7–7.7)
Neutrophils Relative %: 84 %
Platelets: 396 10*3/uL (ref 150–400)
RBC: 4.63 MIL/uL (ref 4.22–5.81)
RDW: 14.5 % (ref 11.5–15.5)
WBC: 19.4 10*3/uL — ABNORMAL HIGH (ref 4.0–10.5)
nRBC: 0 % (ref 0.0–0.2)

## 2018-11-06 LAB — MAGNESIUM: Magnesium: 1.9 mg/dL (ref 1.7–2.4)

## 2018-11-06 MED ORDER — AMLODIPINE BESYLATE 10 MG PO TABS: 10.0000 mg | ORAL_TABLET | Freq: Every day | ORAL | 0 refills | Status: DC

## 2018-11-06 MED ORDER — OXYCODONE HCL 5 MG PO TABS: 5.0000 mg | ORAL_TABLET | ORAL | 0 refills | Status: DC | PRN

## 2018-11-06 MED ORDER — PANTOPRAZOLE SODIUM 40 MG PO TBEC: 40.0000 mg | DELAYED_RELEASE_TABLET | Freq: Every day | ORAL | 0 refills | Status: DC

## 2018-11-06 MED ORDER — CARVEDILOL 6.25 MG PO TABS: 6.2500 mg | ORAL_TABLET | Freq: Two times a day (BID) | ORAL | 0 refills | Status: DC

## 2018-11-06 MED ORDER — ONDANSETRON HCL 4 MG PO TABS: 4.0000 mg | ORAL_TABLET | Freq: Four times a day (QID) | ORAL | 0 refills | Status: DC | PRN

## 2018-11-06 NOTE — Progress Notes (Signed)
Paul [name] discharged Home per MD order.  Discharge instructions reviewed and discussed with the patient, all questions and concerns answered. Copy of instructions and scripts given to patient.  Allergies as of 11/06/2018   No Known Allergies     Medication List    STOP taking these medications   pravastatin 40 MG tablet Commonly known as:  Pravachol     TAKE these medications   amLODipine 10 MG tablet Commonly known as:  NORVASC Take 1 tablet (10 mg total) by mouth daily for 30 days.   carvedilol 6.25 MG tablet Commonly known as:  COREG Take 1 tablet (6.25 mg total) by mouth 2 (two) times daily with a meal for 30 days.   hydrALAZINE 50 MG tablet Commonly known as:  APRESOLINE Take 50 mg by mouth every 8 (eight) hours.   ondansetron 4 MG tablet Commonly known as:  ZOFRAN Take 1 tablet (4 mg total) by mouth every 6 (six) hours as needed for nausea.   oxyCODONE 5 MG immediate release tablet Commonly known as:  Oxy IR/ROXICODONE Take 1 tablet (5 mg total) by mouth every 4 (four) hours as needed for moderate pain.   pantoprazole 40 MG tablet Commonly known as:  Protonix Take 1 tablet (40 mg total) by mouth daily for 30 days.       Patients skin is clean, dry and intact, no evidence of skin break down. IV site discontinued and catheter remains intact. Site without signs and symptoms of complications. Dressing and pressure applied.  Patient escorted to exit by NT, no distress noted upon discharge.  Paul [name] 11/06/2018 11:35 AM

## 2018-11-06 NOTE — Care Management (Signed)
Reinstated [facility] letter, provided to patient. Patient has PCP. No other CM needs at this time.
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
## 2018-11-06 NOTE — Discharge Instructions (Signed)
Acute Pancreatitis ° °Acute pancreatitis happens when the pancreas gets swollen. The pancreas is a large gland behind the stomach. The pancreas helps control blood sugar. It also makes enzymes that help digest food. This condition happens when the enzymes attack the pancreas and damage it. Most attacks last a couple of days and are dangerous. The lungs, heart, and kidneys may stop working. °What are the causes? °· Alcohol abuse. °· Drug abuse. °· Gallstones. °· Some medicines. °· Some chemicals. °· Infection. °· Damage caused by an accident.. °· Belly (abdominal) surgery. °· In some cases, the cause is not known. °What are the signs or symptoms? °· Pain in the upper belly and back. °· Swelling of the belly °· Feeling sick to your stomach (nausea) and throwing up (vomiting). °How is this treated? °· You will probably have to stay in the hospital. °? Treatment may include: °§ Fluid through an IV. °§ A tube to remove stomach contents and stop you from throwing up. °§ Not eating for 3-4 days. °§ Pain medicine. °§ Antibiotic medicines if you have an infection. °§ Surgery on the pancreas or gallbladder. °Follow these instructions at home: °Eating and drinking ° °· Follow instructions from your doctor about diet. °· Eat small meals often. Avoid eating big meals. °· Eat foods that do not have a lot of fat in them. °· Drink enough fluid to keep your pee (urine) pale yellow. °· Do not drink alcohol if it caused your condition. °General instructions °· Take over-the-counter and prescription medicines only as told by your doctor. °· Do not use cigarettes, e-cigarettes, and chewing tobacco. If you need help quitting, ask your doctor. °· Get plenty of rest. °· If directed, check your blood sugar at home as told by your doctor. °· Keep all follow-up visits as told by your doctor. This is important. °Contact a doctor if: °· You do not get better as quickly as expected. °· You have new symptoms. °· Your symptoms get worse. °· You  have lasting pain or weakness. °· You continue to feel sick to your stomach. °· You get better and then you have another pain attack. °· You have a fever. °Get help right away if: °· You cannot eat or keep fluids down. °· Your pain becomes very bad. °· Your skin or the white part of your eyes turns yellow. °· You throw up. °· You feel dizzy or you pass out. °· Your blood sugar is high (over 300 mg/dL). °Summary °· Acute pancreatitis happens when the pancreas gets swollen. °· This condition is usually caused by alcohol abuse, drug abuse, or gallstones. °· You will probably have to stay in the hospital for treatment. °This information is not intended to replace advice given to you by your health care provider. Make sure you discuss any questions you have with your health care provider. °Document Released: 12/31/2007 Document Revised: 11/17/2016 Document Reviewed: 04/17/2015 °[employer] Interactive Patient Education © 2019 [employer] ° °

## 2018-11-07 ENCOUNTER — Other Ambulatory Visit: Payer: Self-pay

## 2018-11-07 ENCOUNTER — Inpatient Hospital Stay (HOSPITAL_COMMUNITY)
Admission: EM | Admit: 2018-11-07 | Discharge: 2018-11-11 | DRG: 439 | Disposition: A | Discharge status: Home / Self Care | Payer: Self-pay | Attending: Internal Medicine | Admitting: Internal Medicine

## 2018-11-07 ENCOUNTER — Emergency Department (HOSPITAL_COMMUNITY): Payer: Self-pay

## 2018-11-07 ENCOUNTER — Encounter (HOSPITAL_COMMUNITY): Payer: Self-pay | Admitting: Emergency Medicine

## 2018-11-07 DIAGNOSIS — F17210 Nicotine dependence, cigarettes, uncomplicated: Secondary | ICD-10-CM | POA: Diagnosis present

## 2018-11-07 DIAGNOSIS — I2510 Atherosclerotic heart disease of native coronary artery without angina pectoris: Secondary | ICD-10-CM | POA: Diagnosis present

## 2018-11-07 DIAGNOSIS — R188 Other ascites: Secondary | ICD-10-CM | POA: Diagnosis present

## 2018-11-07 DIAGNOSIS — R0902 Hypoxemia: Secondary | ICD-10-CM | POA: Diagnosis not present

## 2018-11-07 DIAGNOSIS — Z801 Family history of malignant neoplasm of trachea, bronchus and lung: Secondary | ICD-10-CM

## 2018-11-07 DIAGNOSIS — N182 Chronic kidney disease, stage 2 (mild): Secondary | ICD-10-CM | POA: Diagnosis present

## 2018-11-07 DIAGNOSIS — K6389 Other specified diseases of intestine: Secondary | ICD-10-CM | POA: Diagnosis present

## 2018-11-07 DIAGNOSIS — I5032 Chronic diastolic (congestive) heart failure: Secondary | ICD-10-CM | POA: Diagnosis present

## 2018-11-07 DIAGNOSIS — E785 Hyperlipidemia, unspecified: Secondary | ICD-10-CM | POA: Diagnosis present

## 2018-11-07 DIAGNOSIS — Z6823 Body mass index (BMI) 23.0-23.9, adult: Secondary | ICD-10-CM

## 2018-11-07 DIAGNOSIS — I130 Hypertensive heart and chronic kidney disease with heart failure and stage 1 through stage 4 chronic kidney disease, or unspecified chronic kidney disease: Secondary | ICD-10-CM | POA: Diagnosis present

## 2018-11-07 DIAGNOSIS — N183 Chronic kidney disease, stage 3 unspecified: Secondary | ICD-10-CM | POA: Diagnosis present

## 2018-11-07 DIAGNOSIS — K860 Alcohol-induced chronic pancreatitis: Secondary | ICD-10-CM | POA: Diagnosis present

## 2018-11-07 DIAGNOSIS — K859 Acute pancreatitis without necrosis or infection, unspecified: Secondary | ICD-10-CM

## 2018-11-07 DIAGNOSIS — K852 Alcohol induced acute pancreatitis without necrosis or infection: Principal | ICD-10-CM | POA: Diagnosis present

## 2018-11-07 DIAGNOSIS — E46 Unspecified protein-calorie malnutrition: Secondary | ICD-10-CM | POA: Diagnosis present

## 2018-11-07 DIAGNOSIS — R160 Hepatomegaly, not elsewhere classified: Secondary | ICD-10-CM | POA: Diagnosis present

## 2018-11-07 DIAGNOSIS — Z9049 Acquired absence of other specified parts of digestive tract: Secondary | ICD-10-CM

## 2018-11-07 DIAGNOSIS — F10188 Alcohol abuse with other alcohol-induced disorder: Secondary | ICD-10-CM | POA: Diagnosis present

## 2018-11-07 DIAGNOSIS — J439 Emphysema, unspecified: Secondary | ICD-10-CM | POA: Diagnosis present

## 2018-11-07 DIAGNOSIS — J189 Pneumonia, unspecified organism: Secondary | ICD-10-CM

## 2018-11-07 DIAGNOSIS — J9811 Atelectasis: Secondary | ICD-10-CM | POA: Diagnosis present

## 2018-11-07 DIAGNOSIS — I10 Essential (primary) hypertension: Secondary | ICD-10-CM | POA: Diagnosis present

## 2018-11-07 LAB — CBC WITH DIFFERENTIAL/PLATELET
Abs Immature Granulocytes: 0.07 10*3/uL (ref 0.00–0.07)
Basophils Absolute: 0 10*3/uL (ref 0.0–0.1)
Basophils Relative: 0 %
Eosinophils Absolute: 0.1 10*3/uL (ref 0.0–0.5)
Eosinophils Relative: 1 %
HCT: 44.3 % (ref 39.0–52.0)
Hemoglobin: 14.5 g/dL (ref 13.0–17.0)
Immature Granulocytes: 0 %
Lymphocytes Relative: 6 %
Lymphs Abs: 0.9 10*3/uL (ref 0.7–4.0)
MCH: 31 pg (ref 26.0–34.0)
MCHC: 32.7 g/dL (ref 30.0–36.0)
MCV: 94.9 fL (ref 80.0–100.0)
Monocytes Absolute: 1.4 10*3/uL — ABNORMAL HIGH (ref 0.1–1.0)
Monocytes Relative: 9 %
Neutro Abs: 13.5 10*3/uL — ABNORMAL HIGH (ref 1.7–7.7)
Neutrophils Relative %: 84 %
Platelets: 402 10*3/uL — ABNORMAL HIGH (ref 150–400)
RBC: 4.67 MIL/uL (ref 4.22–5.81)
RDW: 14.5 % (ref 11.5–15.5)
WBC: 15.9 10*3/uL — ABNORMAL HIGH (ref 4.0–10.5)
nRBC: 0 % (ref 0.0–0.2)

## 2018-11-07 LAB — LIPASE, BLOOD: Lipase: 84 U/L — ABNORMAL HIGH (ref 11–51)

## 2018-11-07 LAB — COMPREHENSIVE METABOLIC PANEL
ALT: 16 U/L (ref 0–44)
AST: 18 U/L (ref 15–41)
Albumin: 3 g/dL — ABNORMAL LOW (ref 3.5–5.0)
Alkaline Phosphatase: 53 U/L (ref 38–126)
Anion gap: 11 (ref 5–15)
BUN: 21 mg/dL — ABNORMAL HIGH (ref 6–20)
CO2: 25 mmol/L (ref 22–32)
Calcium: 9.6 mg/dL (ref 8.9–10.3)
Chloride: 96 mmol/L — ABNORMAL LOW (ref 98–111)
Creatinine, Ser: 1.18 mg/dL (ref 0.61–1.24)
GFR calc Af Amer: >60 mL/min (ref >60)
GFR calc non Af Amer: >60 mL/min (ref >60)
Glucose, Bld: 116 mg/dL — ABNORMAL HIGH (ref 70–99)
Potassium: 4.4 mmol/L (ref 3.5–5.1)
Sodium: 132 mmol/L — ABNORMAL LOW (ref 135–145)
Total Bilirubin: 1.1 mg/dL (ref 0.3–1.2)
Total Protein: 6.8 g/dL (ref 6.5–8.1)

## 2018-11-07 LAB — URINALYSIS, ROUTINE W REFLEX MICROSCOPIC
Bilirubin Urine: NEGATIVE
Glucose, UA: NEGATIVE mg/dL
Ketones, ur: NEGATIVE mg/dL
Leukocytes,Ua: NEGATIVE
Nitrite: NEGATIVE
Protein, ur: NEGATIVE mg/dL
Specific Gravity, Urine: 1.031 — ABNORMAL HIGH (ref 1.005–1.030)
pH: 5 (ref 5.0–8.0)

## 2018-11-07 MED ORDER — HYDROMORPHONE HCL 1 MG/ML IJ SOLN
1.0000 mg | Freq: Once | INTRAMUSCULAR | Status: AC
  Administered 2018-11-07: 08:00:00 1 mg via INTRAVENOUS
  Filled 2018-11-07: qty 1

## 2018-11-07 MED ORDER — ONDANSETRON HCL 4 MG/2ML IJ SOLN
4.0000 mg | Freq: Once | INTRAMUSCULAR | Status: AC
  Administered 2018-11-07: 08:00:00 4 mg via INTRAVENOUS
  Filled 2018-11-07: qty 2

## 2018-11-07 MED ORDER — AMLODIPINE BESYLATE 10 MG PO TABS
10.0000 mg | ORAL_TABLET | Freq: Every day | ORAL | Status: DC
  Administered 2018-11-07 – 2018-11-11 (×5): 10 mg via ORAL
  Filled 2018-11-07 (×5): qty 1

## 2018-11-07 MED ORDER — LACTATED RINGERS IV BOLUS
1000.0000 mL | Freq: Once | INTRAVENOUS | Status: AC
  Administered 2018-11-07: 1000 mL via INTRAVENOUS

## 2018-11-07 MED ORDER — MORPHINE SULFATE (PF) 4 MG/ML IV SOLN: 4.0000 mg | Freq: Once | INTRAVENOUS | Status: DC

## 2018-11-07 MED ORDER — CARVEDILOL 6.25 MG PO TABS
6.2500 mg | ORAL_TABLET | Freq: Two times a day (BID) | ORAL | Status: DC
  Administered 2018-11-07 – 2018-11-11 (×8): 6.25 mg via ORAL
  Filled 2018-11-07 (×8): qty 1

## 2018-11-07 MED ORDER — LACTATED RINGERS IV SOLN
INTRAVENOUS | Status: DC
  Administered 2018-11-07 – 2018-11-09 (×5): via INTRAVENOUS
  Administered 2018-11-09: 1000 mL via INTRAVENOUS
  Administered 2018-11-10 – 2018-11-11 (×2): via INTRAVENOUS

## 2018-11-07 MED ORDER — HYDRALAZINE HCL 20 MG/ML IJ SOLN: 5.0000 mg | INTRAMUSCULAR | Status: DC | PRN

## 2018-11-07 MED ORDER — PANTOPRAZOLE SODIUM 40 MG PO TBEC
40.0000 mg | DELAYED_RELEASE_TABLET | Freq: Every day | ORAL | Status: DC
  Administered 2018-11-07 – 2018-11-11 (×5): 40 mg via ORAL
  Filled 2018-11-07 (×5): qty 1

## 2018-11-07 MED ORDER — OXYCODONE HCL 5 MG PO TABS
5.0000 mg | ORAL_TABLET | ORAL | Status: DC | PRN
  Administered 2018-11-07 – 2018-11-11 (×20): 5 mg via ORAL
  Filled 2018-11-07 (×20): qty 1

## 2018-11-07 MED ORDER — ONDANSETRON HCL 4 MG/2ML IJ SOLN: 4.0000 mg | Freq: Four times a day (QID) | INTRAMUSCULAR | Status: DC | PRN

## 2018-11-07 MED ORDER — HYDROMORPHONE HCL 1 MG/ML IJ SOLN
1.0000 mg | INTRAMUSCULAR | Status: DC | PRN
  Administered 2018-11-07 – 2018-11-10 (×20): 1 mg via INTRAVENOUS
  Filled 2018-11-07 (×21): qty 1

## 2018-11-07 MED ORDER — ACETAMINOPHEN 325 MG PO TABS: 650.0000 mg | ORAL_TABLET | Freq: Four times a day (QID) | ORAL | Status: DC | PRN

## 2018-11-07 MED ORDER — HYDRALAZINE HCL 50 MG PO TABS
50.0000 mg | ORAL_TABLET | Freq: Three times a day (TID) | ORAL | Status: DC
  Administered 2018-11-07 – 2018-11-11 (×12): 50 mg via ORAL
  Filled 2018-11-07 (×12): qty 1

## 2018-11-07 MED ORDER — ACETAMINOPHEN 650 MG RE SUPP: 650.0000 mg | Freq: Four times a day (QID) | RECTAL | Status: DC | PRN

## 2018-11-07 MED ORDER — ENOXAPARIN SODIUM 40 MG/0.4ML ~~LOC~~ SOLN
40.0000 mg | SUBCUTANEOUS | Status: DC
  Administered 2018-11-07 – 2018-11-11 (×5): 40 mg via SUBCUTANEOUS
  Filled 2018-11-07 (×5): qty 0.4

## 2018-11-07 MED ORDER — ONDANSETRON HCL 4 MG PO TABS: 4.0000 mg | ORAL_TABLET | Freq: Four times a day (QID) | ORAL | Status: DC | PRN

## 2018-11-07 MED ORDER — IOHEXOL 300 MG/ML  SOLN
100.0000 mL | Freq: Once | INTRAMUSCULAR | Status: AC | PRN
  Administered 2018-11-07: 100 mL via INTRAVENOUS

## 2018-11-07 MED ORDER — HYDROMORPHONE HCL 1 MG/ML IJ SOLN
1.0000 mg | Freq: Once | INTRAMUSCULAR | Status: AC
  Administered 2018-11-07: 09:00:00 1 mg via INTRAVENOUS
  Filled 2018-11-07: qty 1
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
## 2018-11-07 NOTE — ED Notes (Signed)
Admitting at bedside 
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
## 2018-11-07 NOTE — ED Notes (Signed)
Pt assisted to bedside to use urinal. Pt stated he did not want assistance and requested privacy.

## 2018-11-07 NOTE — ED Triage Notes (Signed)
Pt arrives POV with complaints of abdominal pain. Pt discharged from hospital around noon 11/06/18. Pt says pain worsened throughout the night and could not take it any longer. 10/10 pain. Denis N/V

## 2018-11-07 NOTE — ED Notes (Signed)
Attempted to call report. Floor was unable to take at this time
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
## 2018-11-07 NOTE — ED Notes (Signed)
Patient transported to CT 
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
## 2018-11-07 NOTE — ED Notes (Signed)
Pt made aware of urine sample needed. Pt states he "went" before he came and is unable at this time to produce.

## 2018-11-08 DIAGNOSIS — N182 Chronic kidney disease, stage 2 (mild): Secondary | ICD-10-CM

## 2018-11-08 DIAGNOSIS — I10 Essential (primary) hypertension: Secondary | ICD-10-CM

## 2018-11-08 DIAGNOSIS — E785 Hyperlipidemia, unspecified: Secondary | ICD-10-CM

## 2018-11-08 DIAGNOSIS — I5032 Chronic diastolic (congestive) heart failure: Secondary | ICD-10-CM

## 2018-11-08 LAB — COMPREHENSIVE METABOLIC PANEL
ALT: 16 U/L (ref 0–44)
AST: 16 U/L (ref 15–41)
Albumin: 2.2 g/dL — ABNORMAL LOW (ref 3.5–5.0)
Alkaline Phosphatase: 47 U/L (ref 38–126)
Anion gap: 13 (ref 5–15)
BUN: 16 mg/dL (ref 6–20)
CO2: 24 mmol/L (ref 22–32)
Calcium: 8.5 mg/dL — ABNORMAL LOW (ref 8.9–10.3)
Chloride: 96 mmol/L — ABNORMAL LOW (ref 98–111)
Creatinine, Ser: 1.13 mg/dL (ref 0.61–1.24)
GFR calc Af Amer: >60 mL/min (ref >60)
GFR calc non Af Amer: >60 mL/min (ref >60)
Glucose, Bld: 81 mg/dL (ref 70–99)
Potassium: 3.6 mmol/L (ref 3.5–5.1)
Sodium: 133 mmol/L — ABNORMAL LOW (ref 135–145)
Total Bilirubin: 1.2 mg/dL (ref 0.3–1.2)
Total Protein: 5.6 g/dL — ABNORMAL LOW (ref 6.5–8.1)

## 2018-11-08 LAB — CBC
HCT: 34 % — ABNORMAL LOW (ref 39.0–52.0)
Hemoglobin: 11 g/dL — ABNORMAL LOW (ref 13.0–17.0)
MCH: 29.8 pg (ref 26.0–34.0)
MCHC: 32.4 g/dL (ref 30.0–36.0)
MCV: 92.1 fL (ref 80.0–100.0)
Platelets: 375 10*3/uL (ref 150–400)
RBC: 3.69 MIL/uL — ABNORMAL LOW (ref 4.22–5.81)
RDW: 14.5 % (ref 11.5–15.5)
WBC: 13.8 10*3/uL — ABNORMAL HIGH (ref 4.0–10.5)
nRBC: 0 % (ref 0.0–0.2)

## 2018-11-08 LAB — LIPASE, BLOOD: Lipase: 59 U/L — ABNORMAL HIGH (ref 11–51)

## 2018-11-08 LAB — PREALBUMIN: Prealbumin: 5.9 mg/dL — ABNORMAL LOW (ref 18–38)

## 2018-11-08 LAB — PHOSPHORUS: Phosphorus: 4.2 mg/dL (ref 2.5–4.6)
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
## 2018-11-08 NOTE — Progress Notes (Signed)
[name], male   DOB: 21-Mar-1966, 53 y.o.   MRN: [id number]  PROGRESS NOTE    Paul [name]  QIW:[id number] DOB: [birth year] DOA: 11/07/2018 PCP: [name], MD   Brief Narrative:  53 year old male with history of recurrent pancreatitis, alcohol abuse, chronic diastolic heart failure who was recently discharged from the hospital on 11/06/2018 for acute pancreatitis presented back again on 11/07/2018 for severe abdominal pain.  CT of the abdomen and pelvis showed severe acute pancreatitis without evidence of pancreatic necrosis.  Patient was started on IV fluids and analgesics.  Assessment & Plan:   Principal Problem:   Acute recurrent pancreatitis Active Problems:   Essential hypertension   Dyslipidemia   Chronic diastolic CHF (congestive heart failure) (HCC)   CKD (chronic kidney disease) stage 2, GFR 60-89 ml/min  Acute recurrent pancreatitis -He was recently admitted and discharged on 11/06/2018 for the same.  He has had multiple recent hospitalizations for acute pancreatitis.  He had undergone MRI of the abdomen with MRCP in March 2020 which showed acute pancreatitis without necrosis or pseudocyst.  He was supposed to be referred for outpatient GI evaluation. -Status post cholecystectomy on 05/22/2018 -Triglycerides were normal during last admission. -Currently on IV fluids and still complains of 8 out of 10 pain with slight palpation.  Continue IV fluids and analgesics.  Apparently last alcohol drink was on 11/02/2018. -I have requested GI evaluation.  Continue n.p.o. for today.  Will probably start clear liquid diet tomorrow. -Continue oral Protonix  Leukocytosis -Probably reactive.  Improving.  Monitor  Essential hypertension--continue amlodipine, hydralazine and Coreg.  Monitor blood pressure  Chronic diastolic heart failure -No signs of decompensation.  Appears euvolemic although CT abdomen shows moderate sized left pleural effusion and moderate amount of ascites;  he is not tachypneic currently.  Will monitor respiratory status.  Might need intravenous Lasix at some point.  History of alcohol abuse -Last alcohol drink on 11/02/2018.  No signs of withdrawal  Continue disease stage II--creatinine stable.  Dyslipidemia -Statin on hold because of recurrent pancreatitis.   DVT prophylaxis: Lovenox Code Status: Full Family Communication: None at bedside Disposition Plan: Home in 1 to 3 days once clinically improved  Consultants: GI  Procedures: None  Antimicrobials: None   Subjective: Patient seen and examined at bedside.  He is still complaining of 8 out of 10 pain on touching his abdomen.  Still intermittently nauseous but no vomiting or fever overnight.  Objective: Vitals:   11/07/18 1429 11/07/18 1537 11/07/18 2021 11/08/18 0552  BP: (!) 156/89 (!) 157/84 (!) 143/80 138/83  Pulse: 82 88 79 80  Resp:  20 18 18   Temp:   98.9 F (37.2 C) 98.7 F (37.1 C)  TempSrc:   Oral Oral  SpO2:  93% 92% 92%  Weight:      Height:        Intake/Output Summary (Last 24 hours) at 11/08/2018 0948 Last data filed at 11/08/2018 0900 Gross per 24 hour  Intake 3421.72 ml  Output 150 ml  Net 3271.72 ml   Filed Weights   11/07/18 0744  Weight: 79.3 kg    Examination:  General exam: Appears calm and comfortable  Respiratory system: Bilateral decreased breath sounds at bases with basilar crackles. Cardiovascular system: S1 & S2 heard, Rate controlled Gastrointestinal system: Abdomen is nondistended, soft and tender in the epigastric and periumbilical regions.  Normal bowel sounds heard. Extremities: No cyanosis, clubbing, edema   Data Reviewed: I have personally reviewed following labs  and imaging studies  CBC: Recent Labs  Lab 11/04/18 0705 11/05/18 0243 11/06/18 0341 11/07/18 0751 11/08/18 0434  WBC 10.7* 13.2* 19.4* 15.9* 13.8*  NEUTROABS 6.6 11.1* 16.4* 13.5*  --   HGB 14.8 14.6 14.0 14.5 11.0*  HCT 46.4 43.6 43.3 44.3 34.0*  MCV  94.3 92.2 93.5 94.9 92.1  PLT 463* 397 396 402* 182   Basic Metabolic Panel: Recent Labs  Lab 11/04/18 0705 11/05/18 0243 11/06/18 0341 11/07/18 0751 11/08/18 0434  NA 138 135 134* 132* 133*  K 4.0 4.6 4.7 4.4 3.6  CL 99 103 97* 96* 96*  CO2 26 22 26 25 24   GLUCOSE 149* 130* 100* 116* 81  BUN 8 16 19  21* 16  CREATININE 1.15 1.02 1.18 1.18 1.13  CALCIUM 9.9 9.1 9.2 9.6 8.5*  MG 2.0  --  1.9  --   --   PHOS 3.5  --   --   --   --    GFR: Estimated Creatinine Clearance: 85.8 mL/min (by C-G formula based on SCr of 1.13 mg/dL). Liver Function Tests: Recent Labs  Lab 11/04/18 0705 11/05/18 0243 11/06/18 0341 11/07/18 0751 11/08/18 0434  AST 18 13* 15 18 16   ALT 20 13 12 16 16   ALKPHOS 65 53 57 53 47  BILITOT 0.5 0.9 1.1 1.1 1.2  PROT 7.7 6.5 6.1* 6.8 5.6*  ALBUMIN 3.9 3.2* 2.7* 3.0* 2.2*   Recent Labs  Lab 11/04/18 0705 11/05/18 0243 11/07/18 0751 11/08/18 0434  LIPASE 1,708* 689* 84* 59*   No results for input(s): AMMONIA in the last 168 hours. Coagulation Profile: No results for input(s): INR, PROTIME in the last 168 hours. Cardiac Enzymes: Recent Labs  Lab 11/04/18 0705 11/04/18 1607 11/04/18 2242  TROPONINI <0.03 <0.03 <0.03   BNP (last 3 results) No results for input(s): PROBNP in the last 8760 hours. HbA1C: No results for input(s): HGBA1C in the last 72 hours. CBG: No results for input(s): GLUCAP in the last 168 hours. Lipid Profile: No results for input(s): CHOL, HDL, LDLCALC, TRIG, CHOLHDL, LDLDIRECT in the last 72 hours. Thyroid Function Tests: No results for input(s): TSH, T4TOTAL, FREET4, T3FREE, THYROIDAB in the last 72 hours. Anemia Panel: No results for input(s): VITAMINB12, FOLATE, FERRITIN, TIBC, IRON, RETICCTPCT in the last 72 hours. Sepsis Labs: No results for input(s): PROCALCITON, LATICACIDVEN in the last 168 hours.  No results found for this or any previous visit (from the past 240 hour(s)).       Radiology Studies: Ct  Abdomen Pelvis W Contrast  Result Date: 11/07/2018 CLINICAL DATA:  Patient discharged from the hospital yesterday after being admitted on 11/04/2018 for recurrent acute pancreatitis. Patient has had progressively worsening abdominal pain since his discharge. EXAM: CT ABDOMEN AND PELVIS WITH CONTRAST TECHNIQUE: Multidetector CT imaging of the abdomen and pelvis was performed using the standard protocol following bolus administration of intravenous contrast. CONTRAST:  164mL OMNIPAQUE IOHEXOL 300 MG/ML IV. COMPARISON:  MRI abdomen 10/11/2018. CT abdomen and pelvis 09/27/2018 and earlier. FINDINGS: Beam hardening streak artifact is present over the upper abdomen as the patient was unable to raise the arms. Lower chest: Moderate-sized LEFT pleural effusion and associated consolidation in the LEFT LOWER LOBE. Bullous emphysematous changes in the lung bases as noted previously. Heart mildly enlarged with LEFT ventricular hypertrophy and severe LAD and RIGHT coronary atherosclerosis. Hepatobiliary: Mild hepatomegaly, unchanged. No focal hepatic parenchymal abnormality. Surgically absent gallbladder. No biliary ductal dilation. Pancreas: Adjacent cysts involving the mid body (measuring 10 mm)  and distal body (measuring 12 mm) of the pancreas, unchanged since the 09/27/2018 CT. Peripancreatic edema/inflammation. No evidence of pancreatic necrosis. Spleen: Calcified cyst arising from the POSTERIOR LOWER pole of the spleen measuring approximately 2.9 x 4.7 cm, unchanged over multiple prior examinations. No new abnormalities in the spleen. Adrenals/Urinary Tract: Normal appearing adrenal glands. Numerous cysts in both kidneys, numerous exophytic masses arising from both kidneys which are likely hyperdense cysts as they have been present on prior examinations. Parenchymal scarring with cortical thinning involving the UPPER pole the LEFT kidney, unchanged. No new abnormalities involving either kidney. No hydronephrosis.  Dystrophic cortical calcification involving UPPER pole of the LEFT kidney. No urinary tract calculi on either side. Normal appearing urinary bladder. Stomach/Bowel: Stomach normal in appearance for the degree of distention. Wall thickening and dilation of a several centimeters segment of proximal jejunum in the upper abdomen. Remaining small bowel unremarkable. Sigmoid colon diverticulosis without evidence of acute diverticulitis. Remainder of the colon unremarkable. Surgically absent appendix. Vascular/Lymphatic: Moderate to severe aorto-iliofemoral atherosclerosis without evidence of aneurysm. Normal-appearing portal venous and systemic venous systems. No pathologic lymphadenopathy. Reproductive: Prostate gland and seminal vesicles normal in size and appearance for age. Note is made of BILATERAL scrotal hydroceles. Other: Edema/inflammatory changes are present throughout the lesser sac, in the peripancreatic region, with fluid in the paracolic gutters bilaterally, interloop fluid in the jejunum, and small amount of dependent ascites in the pelvis. Small RIGHT inguinal hernia containing a small amount of ascitic fluid. Musculoskeletal: Severe degenerative disc disease at L4-5 and L5-S1. Severe multifactorial spinal stenosis at L4-5. No acute findings. IMPRESSION: 1. Severe acute pancreatitis.  No evidence of pancreatic necrosis. 2. Secondary inflammation of a several centimeter segment of proximal jejunum in the upper abdomen. 3. Mild hepatomegaly without focal hepatic parenchymal abnormality. 4. Numerous exophytic masses involving both kidneys, likely hyperdense cysts as they have been present on prior examinations. 5. Sigmoid colon diverticulosis without evidence of acute diverticulitis. 6. Moderate-sized left pleural effusion and associated passive atelectasis and/or pneumonia in the left lower lobe. 7. Moderate amount of ascites. 8. Bilateral scrotal hydroceles. 9. Severe multifactorial spinal stenosis at L4-5.  10. Stable chronic calcified cyst involving the spleen. 11.  Aortic Atherosclerosis (ICD10-170.0) Electronically Signed   By: Evangeline Dakin M.D.   On: 11/07/2018 09:46        Scheduled Meds:  amLODipine  10 mg Oral Daily   carvedilol  6.25 mg Oral BID WC   enoxaparin (LOVENOX) injection  40 mg Subcutaneous Q24H   hydrALAZINE  50 mg Oral Q8H   pantoprazole  40 mg Oral Daily   Continuous Infusions:  lactated ringers 125 mL/hr at 11/08/18 0505     LOS: 1 day        Aline August, MD [facility] 11/08/2018, 9:48 AM

## 2018-11-09 DIAGNOSIS — D72829 Elevated white blood cell count, unspecified: Secondary | ICD-10-CM

## 2018-11-09 LAB — CBC WITH DIFFERENTIAL/PLATELET
Abs Immature Granulocytes: 0.05 10*3/uL (ref 0.00–0.07)
Basophils Absolute: 0 10*3/uL (ref 0.0–0.1)
Basophils Relative: 0 %
Eosinophils Absolute: 0.2 10*3/uL (ref 0.0–0.5)
Eosinophils Relative: 2 %
HCT: 33.2 % — ABNORMAL LOW (ref 39.0–52.0)
Hemoglobin: 11.1 g/dL — ABNORMAL LOW (ref 13.0–17.0)
Immature Granulocytes: 0 %
Lymphocytes Relative: 7 %
Lymphs Abs: 1 10*3/uL (ref 0.7–4.0)
MCH: 30.7 pg (ref 26.0–34.0)
MCHC: 33.4 g/dL (ref 30.0–36.0)
MCV: 92 fL (ref 80.0–100.0)
Monocytes Absolute: 1.8 10*3/uL — ABNORMAL HIGH (ref 0.1–1.0)
Monocytes Relative: 14 %
Neutro Abs: 10.4 10*3/uL — ABNORMAL HIGH (ref 1.7–7.7)
Neutrophils Relative %: 77 %
Platelets: 368 10*3/uL (ref 150–400)
RBC: 3.61 MIL/uL — ABNORMAL LOW (ref 4.22–5.81)
RDW: 14.5 % (ref 11.5–15.5)
WBC: 13.5 10*3/uL — ABNORMAL HIGH (ref 4.0–10.5)
nRBC: 0 % (ref 0.0–0.2)

## 2018-11-09 LAB — COMPREHENSIVE METABOLIC PANEL
ALT: 14 U/L (ref 0–44)
AST: 15 U/L (ref 15–41)
Albumin: 2.1 g/dL — ABNORMAL LOW (ref 3.5–5.0)
Alkaline Phosphatase: 50 U/L (ref 38–126)
Anion gap: 10 (ref 5–15)
BUN: 11 mg/dL (ref 6–20)
CO2: 26 mmol/L (ref 22–32)
Calcium: 8.3 mg/dL — ABNORMAL LOW (ref 8.9–10.3)
Chloride: 96 mmol/L — ABNORMAL LOW (ref 98–111)
Creatinine, Ser: 0.96 mg/dL (ref 0.61–1.24)
GFR calc Af Amer: >60 mL/min (ref >60)
GFR calc non Af Amer: >60 mL/min (ref >60)
Glucose, Bld: 102 mg/dL — ABNORMAL HIGH (ref 70–99)
Potassium: 3.6 mmol/L (ref 3.5–5.1)
Sodium: 132 mmol/L — ABNORMAL LOW (ref 135–145)
Total Bilirubin: 0.8 mg/dL (ref 0.3–1.2)
Total Protein: 5.4 g/dL — ABNORMAL LOW (ref 6.5–8.1)

## 2018-11-09 LAB — MAGNESIUM: Magnesium: 1.7 mg/dL (ref 1.7–2.4)

## 2018-11-09 MED ORDER — FOLIC ACID 1 MG PO TABS
1.0000 mg | ORAL_TABLET | Freq: Every day | ORAL | Status: DC
  Administered 2018-11-09 – 2018-11-11 (×3): 1 mg via ORAL
  Filled 2018-11-09 (×3): qty 1

## 2018-11-09 MED ORDER — ADULT MULTIVITAMIN W/MINERALS CH
1.0000 | ORAL_TABLET | Freq: Every day | ORAL | Status: DC
  Administered 2018-11-09 – 2018-11-11 (×3): 1 via ORAL
  Filled 2018-11-09 (×3): qty 1

## 2018-11-09 MED ORDER — BOOST / RESOURCE BREEZE PO LIQD CUSTOM
1.0000 | Freq: Three times a day (TID) | ORAL | Status: DC
  Administered 2018-11-09 – 2018-11-10 (×5): 1 via ORAL

## 2018-11-09 MED ORDER — VITAMIN B-1 100 MG PO TABS
100.0000 mg | ORAL_TABLET | Freq: Every day | ORAL | Status: DC
  Administered 2018-11-09 – 2018-11-11 (×3): 100 mg via ORAL
  Filled 2018-11-09 (×3): qty 1

## 2018-11-09 NOTE — Progress Notes (Signed)
[name], male   DOB: [birth year], 53 y.o.   MRN: [id number]  PROGRESS NOTE    Paul [name]  VOZ:[id number] DOB: [birth year] DOA: 11/07/2018 PCP: [name], MD   Brief Narrative:  53 year old male with history of recurrent pancreatitis, alcohol abuse, chronic diastolic heart failure who was recently discharged from the hospital on 11/06/2018 for acute pancreatitis presented back again on 11/07/2018 for severe abdominal pain.  CT of the abdomen and pelvis showed severe acute pancreatitis without evidence of pancreatic necrosis.  Patient was started on IV fluids and analgesics.  Assessment & Plan:   Principal Problem:   Acute recurrent pancreatitis Active Problems:   Essential hypertension   Dyslipidemia   Chronic diastolic CHF (congestive heart failure) (HCC)   CKD (chronic kidney disease) stage 2, GFR 60-89 ml/min  Acute recurrent pancreatitis -He was recently admitted and discharged on 11/06/2018 for the same.  He has had multiple recent hospitalizations for acute pancreatitis.  He had undergone MRI of the abdomen with MRCP in March 2020 which showed acute pancreatitis without necrosis or pseudocyst.  He was supposed to be referred for outpatient GI evaluation. -Status post cholecystectomy on 05/22/2018 -Triglycerides were normal during last admission. -GI evaluation appreciated: Currently on IV fluids at 175 cc an hour.  Tolerating clear liquid diet.  Will advance to full liquid diet.  Continue IV analgesics; still requiring IV Dilaudid.  Patient still up to 8 out of 10 intensity intermittently with palpation.   -Continue oral Protonix  Leukocytosis -Probably reactive.  Improving.  Monitor  Essential hypertension--continue amlodipine, hydralazine and Coreg.  Monitor blood pressure  Chronic diastolic heart failure -No signs of decompensation.  Appears euvolemic although CT abdomen shows moderate sized left pleural effusion and moderate amount of ascites; he is not  tachypneic currently.  Will monitor respiratory status.  Might need intravenous Lasix at some point.  Patient is afebrile with no worsening cough and does not have symptoms of pneumonia.  Will hold off on antibiotics.  History of alcohol abuse -Last alcohol drink on 11/02/2018.  No signs of withdrawal.  Monitor.  Will add multivitamin, thiamine and folate.  Chronic kidney stage II--creatinine stable.  Dyslipidemia -Statin on hold because of recurrent pancreatitis.   DVT prophylaxis: Lovenox Code Status: Full Family Communication: None at bedside Disposition Plan: Home in 1 to 3 days once clinically improved and once cleared by GI  Consultants: GI  Procedures: None  Antimicrobials: None   Subjective: Patient seen and examined at bedside.  He feels slightly better and is tolerating clear liquid diet.  He is still complaining of intermittent 8 out of 10 abdominal pain especially when touching the belly.  No overnight fever or vomiting.  Intermittent nausea present. Objective: Vitals:   11/08/18 1223 11/08/18 1321 11/08/18 2200 11/09/18 0629  BP: 135/74 123/72 138/75 (!) 144/84  Pulse:  74 72 76  Resp:  (!) 22 16 18   Temp:  99.6 F (37.6 C) 99.6 F (37.6 C) 99.5 F (37.5 C)  TempSrc:  Oral Oral Oral  SpO2:  90% (!) 88% 91%  Weight:      Height:        Intake/Output Summary (Last 24 hours) at 11/09/2018 0922 Last data filed at 11/09/2018 0643 Gross per 24 hour  Intake 3589.65 ml  Output -  Net 3589.65 ml   Filed Weights   11/07/18 0744  Weight: 79.3 kg    Examination:  General exam: Appears calm and comfortable.  No distress Respiratory system:  Bilateral decreased breath sounds at bases with basilar crackles.  No wheezing Cardiovascular system: S1 & S2 heard, Rate controlled Gastrointestinal system: Abdomen is nondistended, soft and tender in the epigastric and periumbilical regions.  Normal bowel sounds heard. Extremities: No cyanosis, edema   Data Reviewed: I  have personally reviewed following labs and imaging studies  CBC: Recent Labs  Lab 11/04/18 0705 11/05/18 0243 11/06/18 0341 11/07/18 0751 11/08/18 0434 11/09/18 0304  WBC 10.7* 13.2* 19.4* 15.9* 13.8* 13.5*  NEUTROABS 6.6 11.1* 16.4* 13.5*  --  10.4*  HGB 14.8 14.6 14.0 14.5 11.0* 11.1*  HCT 46.4 43.6 43.3 44.3 34.0* 33.2*  MCV 94.3 92.2 93.5 94.9 92.1 92.0  PLT 463* 397 396 402* 375 638   Basic Metabolic Panel: Recent Labs  Lab 11/04/18 0705 11/05/18 0243 11/06/18 0341 11/07/18 0751 11/08/18 0434 11/08/18 1153 11/09/18 0304  NA 138 135 134* 132* 133*  --  132*  K 4.0 4.6 4.7 4.4 3.6  --  3.6  CL 99 103 97* 96* 96*  --  96*  CO2 26 22 26 25 24   --  26  GLUCOSE 149* 130* 100* 116* 81  --  102*  BUN 8 16 19  21* 16  --  11  CREATININE 1.15 1.02 1.18 1.18 1.13  --  0.96  CALCIUM 9.9 9.1 9.2 9.6 8.5*  --  8.3*  MG 2.0  --  1.9  --   --   --  1.7  PHOS 3.5  --   --   --   --  4.2  --    GFR: Estimated Creatinine Clearance: 101 mL/min (by C-G formula based on SCr of 0.96 mg/dL). Liver Function Tests: Recent Labs  Lab 11/05/18 0243 11/06/18 0341 11/07/18 0751 11/08/18 0434 11/09/18 0304  AST 13* 15 18 16 15   ALT 13 12 16 16 14   ALKPHOS 53 57 53 47 50  BILITOT 0.9 1.1 1.1 1.2 0.8  PROT 6.5 6.1* 6.8 5.6* 5.4*  ALBUMIN 3.2* 2.7* 3.0* 2.2* 2.1*   Recent Labs  Lab 11/04/18 0705 11/05/18 0243 11/07/18 0751 11/08/18 0434  LIPASE 1,708* 689* 84* 59*   No results for input(s): AMMONIA in the last 168 hours. Coagulation Profile: No results for input(s): INR, PROTIME in the last 168 hours. Cardiac Enzymes: Recent Labs  Lab 11/04/18 0705 11/04/18 1607 11/04/18 2242  TROPONINI <0.03 <0.03 <0.03   BNP (last 3 results) No results for input(s): PROBNP in the last 8760 hours. HbA1C: No results for input(s): HGBA1C in the last 72 hours. CBG: No results for input(s): GLUCAP in the last 168 hours. Lipid Profile: No results for input(s): CHOL, HDL, LDLCALC, TRIG,  CHOLHDL, LDLDIRECT in the last 72 hours. Thyroid Function Tests: No results for input(s): TSH, T4TOTAL, FREET4, T3FREE, THYROIDAB in the last 72 hours. Anemia Panel: No results for input(s): VITAMINB12, FOLATE, FERRITIN, TIBC, IRON, RETICCTPCT in the last 72 hours. Sepsis Labs: No results for input(s): PROCALCITON, LATICACIDVEN in the last 168 hours.  No results found for this or any previous visit (from the past 240 hour(s)).       Radiology Studies: No results found.      Scheduled Meds: . amLODipine  10 mg Oral Daily  . carvedilol  6.25 mg Oral BID WC  . enoxaparin (LOVENOX) injection  40 mg Subcutaneous Q24H  . hydrALAZINE  50 mg Oral Q8H  . pantoprazole  40 mg Oral Daily   Continuous Infusions: . lactated ringers 175 mL/hr at 11/09/18 [id number]  LOS: 2 days        Aline August, MD [facility] 11/09/2018, 9:22 AM
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
## 2018-11-09 NOTE — Progress Notes (Addendum)
Patient ID: Chesley Veasey, male   DOB: 1965-12-25, 53 y.o.   MRN: [id number]    Progress Note   Subjective  Feeling about the same , taking some pain meds which helps- tolerated clears, and starting full liquid today  No fever, No N/V No SOB but "sore " with deap breathing left back   Prealbumin 5.9   Objective   Vital signs in last 24 hours: Temp:  [99.5 F (37.5 C)-99.6 F (37.6 C)] 99.5 F (37.5 C) (04/14 0629) Pulse Rate:  [72-76] 76 (04/14 0629) Resp:  [16-22] 18 (04/14 0629) BP: (123-144)/(72-84) 144/84 (04/14 0629) SpO2:  [88 %-91 %] 91 % (04/14 0629) Last BM Date: 11/05/18 General:    white male  in NAD Heart:  Regular rate and rhythm; no murmurs Lungs: Respirations even and unlabored, decreased BS left base Abdomen:  Soft, tender epigastrium and LUQ,and nondistended. Normal bowel sounds. Extremities:  Without edema. Neurologic:  Alert and oriented,  grossly normal neurologically. Psych:  Cooperative. Normal mood and affect.  Intake/Output from previous day: 04/13 0701 - 04/14 0700 In: 3589.7 [I.V.:3589.7] Out: -  Intake/Output this shift: No intake/output data recorded.  Lab Results: Mg / Phosp -WNL Recent Labs    11/07/18 0751 11/08/18 0434 11/09/18 0304  WBC 15.9* 13.8* 13.5*  HGB 14.5 11.0* 11.1*  HCT 44.3 34.0* 33.2*  PLT 402* 375 368   BMET Recent Labs    11/07/18 0751 11/08/18 0434 11/09/18 0304  NA 132* 133* 132*  K 4.4 3.6 3.6  CL 96* 96* 96*  CO2 25 24 26   GLUCOSE 116* 81 102*  BUN 21* 16 11  CREATININE 1.18 1.13 0.96  CALCIUM 9.6 8.5* 8.3*   LFT Recent Labs    11/09/18 0304  PROT 5.4*  ALBUMIN 2.1*  AST 15  ALT 14  ALKPHOS 50  BILITOT 0.8   PT/INR No results for input(s): LABPROT, INR in the last 72 hours.     Assessment / Plan:    Imp ; 53 year old male readmitted with smoldering acute pancreatitis which is been present over the past month.  Etiology felt secondary to EtOH, patient is status post cholecystectomy October  2019 with no evidence of choledocholithiasis and continues to have normal LFTs.  CT showing significant diffuse acute pancreatitis with peripancreatic inflammatory changes and moderate ascites, no necrosis and no pseudocyst formation Patient has been hemodynamically stable and afebrile  Tolerating liquid diet WBC trending down  #2 left pleural effusion and left lower lobe consolidation rule out pneumonia-consider repeat chest x-ray in a.m., incentive spirometry.  No antibiotics currently  #3 CKD - stable  #4 EF 60  #5 malnutrition - secondary to prolonged pancreatitis- add Boost TID between meals  Plan; continue liberal fluids, will decrease to 150/h Low-fat full liquid diet Repeat chest x-ray in a.m. Pain management  Hopefully can be discharged home later  this week.        Principal Problem:   Acute recurrent pancreatitis Active Problems:   Essential hypertension   Dyslipidemia   Chronic diastolic CHF (congestive heart failure) (HCC)   CKD (chronic kidney disease) stage 2, GFR 60-89 ml/min     LOS: 2 days   Estefania Kamiya  11/09/2018, 9:58 AM

## 2018-11-10 ENCOUNTER — Inpatient Hospital Stay (HOSPITAL_COMMUNITY): Payer: Self-pay

## 2018-11-10 DIAGNOSIS — R0902 Hypoxemia: Secondary | ICD-10-CM

## 2018-11-10 LAB — COMPREHENSIVE METABOLIC PANEL
ALT: 18 U/L (ref 0–44)
AST: 17 U/L (ref 15–41)
Albumin: 2.1 g/dL — ABNORMAL LOW (ref 3.5–5.0)
Alkaline Phosphatase: 78 U/L (ref 38–126)
Anion gap: 11 (ref 5–15)
BUN: 7 mg/dL (ref 6–20)
CO2: 26 mmol/L (ref 22–32)
Calcium: 8.5 mg/dL — ABNORMAL LOW (ref 8.9–10.3)
Chloride: 96 mmol/L — ABNORMAL LOW (ref 98–111)
Creatinine, Ser: 0.93 mg/dL (ref 0.61–1.24)
GFR calc Af Amer: >60 mL/min (ref >60)
GFR calc non Af Amer: >60 mL/min (ref >60)
Glucose, Bld: 113 mg/dL — ABNORMAL HIGH (ref 70–99)
Potassium: 3.4 mmol/L — ABNORMAL LOW (ref 3.5–5.1)
Sodium: 133 mmol/L — ABNORMAL LOW (ref 135–145)
Total Bilirubin: 0.8 mg/dL (ref 0.3–1.2)
Total Protein: 5.3 g/dL — ABNORMAL LOW (ref 6.5–8.1)

## 2018-11-10 LAB — CBC WITH DIFFERENTIAL/PLATELET
Abs Immature Granulocytes: 0.11 10*3/uL — ABNORMAL HIGH (ref 0.00–0.07)
Basophils Absolute: 0 10*3/uL (ref 0.0–0.1)
Basophils Relative: 0 %
Eosinophils Absolute: 0.2 10*3/uL (ref 0.0–0.5)
Eosinophils Relative: 1 %
HCT: 33.8 % — ABNORMAL LOW (ref 39.0–52.0)
Hemoglobin: 11.2 g/dL — ABNORMAL LOW (ref 13.0–17.0)
Immature Granulocytes: 1 %
Lymphocytes Relative: 8 %
Lymphs Abs: 1 10*3/uL (ref 0.7–4.0)
MCH: 29.9 pg (ref 26.0–34.0)
MCHC: 33.1 g/dL (ref 30.0–36.0)
MCV: 90.4 fL (ref 80.0–100.0)
Monocytes Absolute: 1.7 10*3/uL — ABNORMAL HIGH (ref 0.1–1.0)
Monocytes Relative: 14 %
Neutro Abs: 9.2 10*3/uL — ABNORMAL HIGH (ref 1.7–7.7)
Neutrophils Relative %: 76 %
Platelets: 390 10*3/uL (ref 150–400)
RBC: 3.74 MIL/uL — ABNORMAL LOW (ref 4.22–5.81)
RDW: 14.2 % (ref 11.5–15.5)
WBC: 12.2 10*3/uL — ABNORMAL HIGH (ref 4.0–10.5)
nRBC: 0 % (ref 0.0–0.2)

## 2018-11-10 LAB — MAGNESIUM: Magnesium: 1.7 mg/dL (ref 1.7–2.4)

## 2018-11-10 MED ORDER — FUROSEMIDE 10 MG/ML IJ SOLN
40.0000 mg | Freq: Once | INTRAMUSCULAR | Status: AC
  Administered 2018-11-10: 11:00:00 40 mg via INTRAVENOUS
  Filled 2018-11-10: qty 4

## 2018-11-10 MED ORDER — POTASSIUM CHLORIDE CRYS ER 20 MEQ PO TBCR
20.0000 meq | EXTENDED_RELEASE_TABLET | Freq: Once | ORAL | Status: AC
  Administered 2018-11-10: 20 meq via ORAL
  Filled 2018-11-10: qty 1

## 2018-11-10 MED ORDER — MAGNESIUM SULFATE 4 GM/100ML IV SOLN
4.0000 g | Freq: Once | INTRAVENOUS | Status: AC
  Administered 2018-11-10: 4 g via INTRAVENOUS
  Filled 2018-11-10: qty 100

## 2018-11-10 MED ORDER — POTASSIUM CHLORIDE CRYS ER 20 MEQ PO TBCR
40.0000 meq | EXTENDED_RELEASE_TABLET | Freq: Once | ORAL | Status: AC
  Administered 2018-11-10: 40 meq via ORAL
  Filled 2018-11-10: qty 2
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
## 2018-11-10 NOTE — Progress Notes (Signed)
PROGRESS NOTE    Paul [name]  BRA:[id number] DOB: 04/20/1966 DOA: 11/07/2018 PCP: [name], MD    Brief Narrative:  53 year old male with history of recurrent pancreatitis, alcohol abuse, chronic diastolic heart failure who was recently discharged from the hospital on 11/06/2018 for acute pancreatitis presented back again on 11/07/2018 for severe abdominal pain.  CT of the abdomen and pelvis showed severe acute pancreatitis without evidence of pancreatic necrosis.  Patient was started on IV fluids and analgesics.   Assessment & Plan:   Principal Problem:   Acute recurrent pancreatitis Active Problems:   Essential hypertension   Dyslipidemia   Chronic diastolic CHF (congestive heart failure) (HCC)   CKD (chronic kidney disease) stage 2, GFR 60-89 ml/min   Hypoxia  1 acute recurrent pancreatitis Likely secondary to alcohol abuse.  Patient recently admitted and discharged from 06/17/2019 for acute recurrent pancreatitis and multiple hospitalizations for this.  Patient underwent MRI MRCP in March 2020 which showed acute pancreatitis without necrosis or pseudocyst.  Patient was post referred to outpatient GI evaluation.  Patient status post cholecystectomy 05/22/2018.  Triglycerides were normal during last admission.  Patient improving clinically and tolerating full liquid diet.  Decrease IV fluids to 75 cc/h.  Continue pain management.  Supportive care.  GI following and appreciate input and recommendations.  2.  Hypoxia Patient noted to have sats of 88% on room air early this morning.  Patient is +9.143 L during this hospitalization.  Likely secondary to volume overload.  Repeat chest x-ray with small pleural effusion.  Check daily weights.  Strict I's and O's.  Decrease IV fluids to 75 cc/h.  Lasix 40 mg IV x1.  Incentive spirometry.  Follow.  3.  Chronic kidney disease stage II Stable.  Follow.  4.  History of alcohol abuse Alcohol cessation.  No signs of withdrawal noted.  Keep  magnesium greater than 2.  Keep potassium greater than 4.  Continue thiamine and folic acid and multivitamin.  5.  Chronic diastolic CHF Continue Coreg, Norvasc, hydralazine.  Give a dose of Lasix 40 mg IV x1 due to problem #2.  6.  Hypertension Continue Norvasc, Coreg, hydralazine.  Lasix 40 mg IV x1 today due to hypoxia.  Follow.  7.  Leukocytosis Likely reactive secondary to problem #1.  Trending down.  No need for antibiotics at this time.  Follow.  8.  Hyperlipidemia Statin on hold secondary to problem #1.    DVT prophylaxis: Lovenox Code Status: Full Family Communication: Updated patient.  No family at bedside. Disposition Plan: Likely home with continued clinical improvement hopefully in the next 1 to 2 days.   Consultants:   Gastroenterology: Dr. Hilarie Fredrickson 11/08/2018  Procedures:   CT abdomen and pelvis 11/07/2018  Chest x-ray 11/10/2018    Antimicrobials:  None    Subjective: Patient laying in bed.  Feels epigastric abdominal pain improving.  Tolerating full liquid diet.  Complaining of some left-sided discomfort with deep inspiration.  O2 sats noted to be 88% on room air this morning.  Wondering whether diet may be advanced to a more solid diet.  Objective: Vitals:   11/09/18 0629 11/09/18 2141 11/09/18 2145 11/10/18 0605  BP: (!) 144/84 (!) 147/73 (!) 147/73 (!) 147/80  Pulse: 76  78 72  Resp: 18  16 18   Temp: 99.5 F (37.5 C)  98.4 F (36.9 C) 98.7 F (37.1 C)  TempSrc: Oral  Oral Oral  SpO2: 91%  90% (!) 88%  Weight:  Height:        Intake/Output Summary (Last 24 hours) at 11/10/2018 1732 Last data filed at 11/10/2018 1353 Gross per 24 hour  Intake 2044.64 ml  Output 1400 ml  Net 644.64 ml   Filed Weights   11/07/18 0744  Weight: 79.3 kg    Examination:  General exam: Appears calm and comfortable  Respiratory system: Some right basilar crackles.  No wheezing.  No rhonchi noted.  Speaking in full sentences.  Normal respiratory effort.   Cardiovascular system: S1 & S2 heard, RRR. No JVD, murmurs, rubs, gallops or clicks. No pedal edema. Gastrointestinal system: Abdomen is nondistended, soft and nontender. No organomegaly or masses felt. Normal bowel sounds heard. Central nervous system: Alert and oriented. No focal neurological deficits. Extremities: Symmetric 5 x 5 power. Skin: No rashes, lesions or ulcers Psychiatry: Judgement and insight appear normal. Mood & affect appropriate.     Data Reviewed: I have personally reviewed following labs and imaging studies  CBC: Recent Labs  Lab 11/05/18 0243 11/06/18 0341 11/07/18 0751 11/08/18 0434 11/09/18 0304 11/10/18 0317  WBC 13.2* 19.4* 15.9* 13.8* 13.5* 12.2*  NEUTROABS 11.1* 16.4* 13.5*  --  10.4* 9.2*  HGB 14.6 14.0 14.5 11.0* 11.1* 11.2*  HCT 43.6 43.3 44.3 34.0* 33.2* 33.8*  MCV 92.2 93.5 94.9 92.1 92.0 90.4  PLT 397 396 402* 375 368 341   Basic Metabolic Panel: Recent Labs  Lab 11/04/18 0705  11/06/18 0341 11/07/18 0751 11/08/18 0434 11/08/18 1153 11/09/18 0304 11/10/18 0317  NA 138   < > 134* 132* 133*  --  132* 133*  K 4.0   < > 4.7 4.4 3.6  --  3.6 3.4*  CL 99   < > 97* 96* 96*  --  96* 96*  CO2 26   < > 26 25 24   --  26 26  GLUCOSE 149*   < > 100* 116* 81  --  102* 113*  BUN 8   < > 19 21* 16  --  11 7  CREATININE 1.15   < > 1.18 1.18 1.13  --  0.96 0.93  CALCIUM 9.9   < > 9.2 9.6 8.5*  --  8.3* 8.5*  MG 2.0  --  1.9  --   --   --  1.7 1.7  PHOS 3.5  --   --   --   --  4.2  --   --    < > = values in this interval not displayed.   GFR: Estimated Creatinine Clearance: 104.3 mL/min (by C-G formula based on SCr of 0.93 mg/dL). Liver Function Tests: Recent Labs  Lab 11/06/18 0341 11/07/18 0751 11/08/18 0434 11/09/18 0304 11/10/18 0317  AST 15 18 16 15 17   ALT 12 16 16 14 18   ALKPHOS 57 53 47 50 78  BILITOT 1.1 1.1 1.2 0.8 0.8  PROT 6.1* 6.8 5.6* 5.4* 5.3*  ALBUMIN 2.7* 3.0* 2.2* 2.1* 2.1*   Recent Labs  Lab 11/04/18 0705 11/05/18  0243 11/07/18 0751 11/08/18 0434  LIPASE 1,708* 689* 84* 59*   No results for input(s): AMMONIA in the last 168 hours. Coagulation Profile: No results for input(s): INR, PROTIME in the last 168 hours. Cardiac Enzymes: Recent Labs  Lab 11/04/18 0705 11/04/18 1607 11/04/18 2242  TROPONINI <0.03 <0.03 <0.03   BNP (last 3 results) No results for input(s): PROBNP in the last 8760 hours. HbA1C: No results for input(s): HGBA1C in the last 72 hours. CBG: No results for  input(s): GLUCAP in the last 168 hours. Lipid Profile: No results for input(s): CHOL, HDL, LDLCALC, TRIG, CHOLHDL, LDLDIRECT in the last 72 hours. Thyroid Function Tests: No results for input(s): TSH, T4TOTAL, FREET4, T3FREE, THYROIDAB in the last 72 hours. Anemia Panel: No results for input(s): VITAMINB12, FOLATE, FERRITIN, TIBC, IRON, RETICCTPCT in the last 72 hours. Sepsis Labs: No results for input(s): PROCALCITON, LATICACIDVEN in the last 168 hours.  No results found for this or any previous visit (from the past 240 hour(s)).       Radiology Studies: Dg Chest 2 View  Result Date: 11/10/2018 CLINICAL DATA:  Pneumonia.  Acute pancreatitis. EXAM: CHEST - 2 VIEW COMPARISON:  Abdominal CT scan dated 11/07/2018, chest CT dated 09/27/2018 and chest x-ray 04/14/2012 FINDINGS: The heart size and pulmonary vascularity are normal. There is a small left pleural effusion with extensive atelectasis in the left lower lobe. The right lung is clear. No acute bone abnormality. Old deformity of the left scapula secondary to prior fractures. IMPRESSION: Small left pleural effusion with extensive atelectasis in the left lower lobe. This is new since the prior chest CT dated 09/27/2018. Electronically Signed   By: Lorriane Shire M.D.   On: 11/10/2018 08:36        Scheduled Meds: . amLODipine  10 mg Oral Daily  . carvedilol  6.25 mg Oral BID WC  . enoxaparin (LOVENOX) injection  40 mg Subcutaneous Q24H  . feeding supplement   1 Container Oral TID BM  . folic acid  1 mg Oral Daily  . hydrALAZINE  50 mg Oral Q8H  . multivitamin with minerals  1 tablet Oral Daily  . pantoprazole  40 mg Oral Daily  . thiamine  100 mg Oral Daily   Continuous Infusions: . lactated ringers 75 mL/hr at 11/10/18 1311     LOS: 3 days    Time spent: 35 minutes    Irine Seal, MD [facility]  If 7PM-7AM, please contact night-coverage [email or web address] 11/10/2018, 5:32 PM

## 2018-11-11 ENCOUNTER — Telehealth: Payer: Self-pay | Admitting: *Deleted

## 2018-11-11 LAB — CBC WITH DIFFERENTIAL/PLATELET
Abs Immature Granulocytes: 0.11 10*3/uL — ABNORMAL HIGH (ref 0.00–0.07)
Basophils Absolute: 0 10*3/uL (ref 0.0–0.1)
Basophils Relative: 0 %
Eosinophils Absolute: 0.2 10*3/uL (ref 0.0–0.5)
Eosinophils Relative: 2 %
HCT: 31.8 % — ABNORMAL LOW (ref 39.0–52.0)
Hemoglobin: 10.7 g/dL — ABNORMAL LOW (ref 13.0–17.0)
Immature Granulocytes: 1 %
Lymphocytes Relative: 11 %
Lymphs Abs: 1.4 10*3/uL (ref 0.7–4.0)
MCH: 30.4 pg (ref 26.0–34.0)
MCHC: 33.6 g/dL (ref 30.0–36.0)
MCV: 90.3 fL (ref 80.0–100.0)
Monocytes Absolute: 1.7 10*3/uL — ABNORMAL HIGH (ref 0.1–1.0)
Monocytes Relative: 14 %
Neutro Abs: 8.6 10*3/uL — ABNORMAL HIGH (ref 1.7–7.7)
Neutrophils Relative %: 72 %
Platelets: 440 10*3/uL — ABNORMAL HIGH (ref 150–400)
RBC: 3.52 MIL/uL — ABNORMAL LOW (ref 4.22–5.81)
RDW: 14.5 % (ref 11.5–15.5)
WBC: 12 10*3/uL — ABNORMAL HIGH (ref 4.0–10.5)
nRBC: 0 % (ref 0.0–0.2)

## 2018-11-11 LAB — BASIC METABOLIC PANEL
Anion gap: 10 (ref 5–15)
BUN: 8 mg/dL (ref 6–20)
CO2: 27 mmol/L (ref 22–32)
Calcium: 8.4 mg/dL — ABNORMAL LOW (ref 8.9–10.3)
Chloride: 96 mmol/L — ABNORMAL LOW (ref 98–111)
Creatinine, Ser: 0.93 mg/dL (ref 0.61–1.24)
GFR calc Af Amer: >60 mL/min (ref >60)
GFR calc non Af Amer: >60 mL/min (ref >60)
Glucose, Bld: 125 mg/dL — ABNORMAL HIGH (ref 70–99)
Potassium: 3.7 mmol/L (ref 3.5–5.1)
Sodium: 133 mmol/L — ABNORMAL LOW (ref 135–145)

## 2018-11-11 LAB — MAGNESIUM: Magnesium: 2 mg/dL (ref 1.7–2.4)

## 2018-11-11 MED ORDER — THIAMINE HCL 100 MG PO TABS: 100.0000 mg | ORAL_TABLET | Freq: Every day | ORAL | Status: DC

## 2018-11-11 MED ORDER — FUROSEMIDE 10 MG/ML IJ SOLN
40.0000 mg | Freq: Once | INTRAMUSCULAR | Status: AC
  Administered 2018-11-11: 10:00:00 40 mg via INTRAVENOUS
  Filled 2018-11-11: qty 4

## 2018-11-11 MED ORDER — FOLIC ACID 1 MG PO TABS: 1.0000 mg | ORAL_TABLET | Freq: Every day | ORAL | Status: DC

## 2018-11-11 MED ORDER — POTASSIUM CHLORIDE CRYS ER 20 MEQ PO TBCR
40.0000 meq | EXTENDED_RELEASE_TABLET | Freq: Once | ORAL | Status: AC
  Administered 2018-11-11: 10:00:00 40 meq via ORAL
  Filled 2018-11-11: qty 2

## 2018-11-11 MED ORDER — ADULT MULTIVITAMIN W/MINERALS CH: 1.0000 | ORAL_TABLET | Freq: Every day | ORAL | Status: DC

## 2018-11-11 NOTE — Discharge Summary (Signed)
Physician Discharge Summary  Ivie Maese EXN:[id number] DOB: 23-Aug-1965 DOA: 11/07/2018  PCP: [name], MD  Admit date: 11/07/2018 Discharge date: 11/11/2018  Time spent: 55 minutes  Recommendations for Outpatient Follow-up:  1. [facility]  On follow-up patient will need a basic metabolic profile done to follow-up on electrolytes and renal function. 2. Follow-up with Dr. Donnella Sham, gastroenterology in 2 to 4 weeks. 3. Follow-up with [name], MD in 2 weeks.   Discharge Diagnoses:  Principal Problem:   Acute recurrent pancreatitis Active Problems:   Essential hypertension   Dyslipidemia   Chronic diastolic CHF (congestive heart failure) (HCC)   CKD (chronic kidney disease) stage 2, GFR 60-89 ml/min   Hypoxia   Discharge Condition: Stable and improved  Diet recommendation: Regular  Filed Weights   11/07/18 0744  Weight: 79.3 kg    History of present illness:  Per Dr. Sharen Hones Matera is a 53 y.o. male with medical history significant of recurrent pancreatitis, alcohol abuse, chronic diastolic heart failure who was recently discharged from the hospital for acute pancreatitis on 4/11.  He returned with continued abdominal pain and inability to advance diet at home. He stated that he never should have been discharged, stated that he downplayed his pain and his oral intake. Had pear and frosted flakes prior to discharge yesterday but unable to take any other food last 24 hours. Since his discharge 24 hours ago, he had only been able to tolerate water and some Sprite. Denied fevers, chills, but has some sharp pleuritic pain with deep breaths, no cough. Nausea without vomiting, no diarrhea, edema, or rash.   ED Course: Lipase trending downward 84, WBC 15.9, CT abdomen pelvis obtained which revealed severe acute pancreatitis without evidence of pancreatic necrosis, secondary inflammation proximal jejunum.  Patient given IV  Dilaudid for pain control, referred for admission for intractable pain and acute pancreatitis  Hospital Course:  1 acute recurrent pancreatitis Likely secondary to alcohol abuse.  Patient recently admitted and discharged on 11/06/2018 for acute recurrent pancreatitis and multiple hospitalizations for this.  Patient underwent MRI/ MRCP in March 2020 which showed acute pancreatitis without necrosis or pseudocyst.  Patient was post referred to outpatient GI evaluation.  Patient status post cholecystectomy 05/22/2018.  Triglycerides were normal during last admission.  Patient was placed on bowel rest, aggressively hydrated with IV fluids, pain management and supportive care.  GI was consulted to follow the patient throughout the hospitalization.  Patient improved clinically and subsequently started on clear liquid diet which he tolerated.  Diet was advanced to full liquid diet which he tolerated and subsequently to a soft diet.  Patient improved clinically abdominal pain had improved with oral intake.  Patient was discharged home in stable and improved condition and is to follow-up with PCP and gastroenterology in the outpatient setting.   2.  Hypoxia Patient noted to have sats of 88% on room air the morning of 11/10/2018.  Patient noted to be +9 L during his hospitalization due to aggressive fluid resuscitation secondary to presentation of acute pancreatitis. Repeat chest x-ray with small pleural effusion.  IV fluids were decreased and subsequently discontinued.  Patient received 2 doses of Lasix 40 mg IV with resolution of hypoxia by day of discharge.  Outpatient follow-up.  3.  Chronic kidney disease stage II Remained stable.  Follow.  4.  History of alcohol abuse Alcohol cessation.  No signs of withdrawal noted.  Patient's electrolytes were repleted.  Alcohol cessation  was stressed to patient.  Patient was maintained on thiamine, folic acid and multivitamin.  Outpatient follow-up.  5.  Chronic  diastolic CHF Patient maintained on home regimen of Coreg, Norvasc, hydralazine.  Patient received 2 doses of Lasix 40 mg IV due to concerns for hypoxia from aggressive fluid resuscitation for patient's acute pancreatitis.  Outpatient follow-up.  6.  Hypertension Blood pressure remained stable on home regimen of Norvasc, Coreg, hydralazine.  Patient was given 2 doses of IV Lasix due to hypoxia which improved with diuresis.  Outpatient follow-up.   7.  Leukocytosis Likely reactive secondary to problem #1.  Leukocytosis trended down.  Outpatient follow-up.   8.  Hyperlipidemia Statin was held during the hospitalization secondary to problem #1.   Procedures:  CT abdomen and pelvis 11/07/2018  Chest x-ray 11/10/2018  Consultations:  Gastroenterology: Dr. Hilarie Fredrickson 11/08/2018  Discharge Exam: Vitals:   11/10/18 2230 11/11/18 0516  BP: 138/78 (!) 144/83  Pulse: 65 65  Resp: 18 16  Temp: 98.7 F (37.1 C) 98.3 F (36.8 C)  SpO2: 92% 93%    General: NAD Cardiovascular: RRR Respiratory: CTAB  Discharge Instructions   Discharge Instructions    Diet general   Complete by:  As directed    Discharge instructions   Complete by:  As directed    Please stop drinking alcohol.   Increase activity slowly   Complete by:  As directed      Allergies as of 11/11/2018   No Known Allergies     Medication List    TAKE these medications   amLODipine 10 MG tablet Commonly known as:  NORVASC Take 1 tablet (10 mg total) by mouth daily for 30 days.   carvedilol 6.25 MG tablet Commonly known as:  COREG Take 1 tablet (6.25 mg total) by mouth 2 (two) times daily with a meal for 30 days.   folic acid 1 MG tablet Commonly known as:  FOLVITE Take 1 tablet (1 mg total) by mouth daily. Start taking on:  November 12, 2018   hydrALAZINE 50 MG tablet Commonly known as:  APRESOLINE Take 50 mg by mouth every 8 (eight) hours.   multivitamin with minerals Tabs tablet Take 1 tablet by mouth  daily. Start taking on:  November 12, 2018   ondansetron 4 MG tablet Commonly known as:  ZOFRAN Take 1 tablet (4 mg total) by mouth every 6 (six) hours as needed for nausea.   oxyCODONE 5 MG immediate release tablet Commonly known as:  Oxy IR/ROXICODONE Take 1 tablet (5 mg total) by mouth every 4 (four) hours as needed for moderate pain.   pantoprazole 40 MG tablet Commonly known as:  Protonix Take 1 tablet (40 mg total) by mouth daily for 30 days.   thiamine 100 MG tablet Take 1 tablet (100 mg total) by mouth daily. Start taking on:  November 12, 2018      No Known Allergies Follow-up Information    [facility]. Go on 11/25/2018.   Why:  at 2:30pm, With Dr Sondra Barges information: [place] [id number] [phone]       [name], MD. Schedule an appointment as soon as possible for a visit in 2 week(s).   Specialty:  Internal Medicine Contact information: [place] [phone]        Jerene Bears, MD. Schedule an appointment as soon as possible for a visit in 2 week(s).  Specialty:  Gastroenterology Why:  f/u in 2-4 weeks. Contact information: [address] [address] [phone]            The results of significant diagnostics from this hospitalization (including imaging, microbiology, ancillary and laboratory) are listed below for reference.    Significant Diagnostic Studies: Dg Chest 2 View  Result Date: 11/10/2018 CLINICAL DATA:  Pneumonia.  Acute pancreatitis. EXAM: CHEST - 2 VIEW COMPARISON:  Abdominal CT scan dated 11/07/2018, chest CT dated 09/27/2018 and chest x-ray 04/14/2012 FINDINGS: The heart size and pulmonary vascularity are normal. There is a small left pleural effusion with extensive atelectasis in the left lower lobe. The right lung is clear. No acute bone abnormality. Old deformity of the left scapula  secondary to prior fractures. IMPRESSION: Small left pleural effusion with extensive atelectasis in the left lower lobe. This is new since the prior chest CT dated 09/27/2018. Electronically Signed   By: Lorriane Shire M.D.   On: 11/10/2018 08:36   Ct Abdomen Pelvis W Contrast  Result Date: 11/07/2018 CLINICAL DATA:  Patient discharged from the hospital yesterday after being admitted on 11/04/2018 for recurrent acute pancreatitis. Patient has had progressively worsening abdominal pain since his discharge. EXAM: CT ABDOMEN AND PELVIS WITH CONTRAST TECHNIQUE: Multidetector CT imaging of the abdomen and pelvis was performed using the standard protocol following bolus administration of intravenous contrast. CONTRAST:  140mL OMNIPAQUE IOHEXOL 300 MG/ML IV. COMPARISON:  MRI abdomen 10/11/2018. CT abdomen and pelvis 09/27/2018 and earlier. FINDINGS: Beam hardening streak artifact is present over the upper abdomen as the patient was unable to raise the arms. Lower chest: Moderate-sized LEFT pleural effusion and associated consolidation in the LEFT LOWER LOBE. Bullous emphysematous changes in the lung bases as noted previously. Heart mildly enlarged with LEFT ventricular hypertrophy and severe LAD and RIGHT coronary atherosclerosis. Hepatobiliary: Mild hepatomegaly, unchanged. No focal hepatic parenchymal abnormality. Surgically absent gallbladder. No biliary ductal dilation. Pancreas: Adjacent cysts involving the mid body (measuring 10 mm) and distal body (measuring 12 mm) of the pancreas, unchanged since the 09/27/2018 CT. Peripancreatic edema/inflammation. No evidence of pancreatic necrosis. Spleen: Calcified cyst arising from the POSTERIOR LOWER pole of the spleen measuring approximately 2.9 x 4.7 cm, unchanged over multiple prior examinations. No new abnormalities in the spleen. Adrenals/Urinary Tract: Normal appearing adrenal glands. Numerous cysts in both kidneys, numerous exophytic masses arising from both  kidneys which are likely hyperdense cysts as they have been present on prior examinations. Parenchymal scarring with cortical thinning involving the UPPER pole the LEFT kidney, unchanged. No new abnormalities involving either kidney. No hydronephrosis. Dystrophic cortical calcification involving UPPER pole of the LEFT kidney. No urinary tract calculi on either side. Normal appearing urinary bladder. Stomach/Bowel: Stomach normal in appearance for the degree of distention. Wall thickening and dilation of a several centimeters segment of proximal jejunum in the upper abdomen. Remaining small bowel unremarkable. Sigmoid colon diverticulosis without evidence of acute diverticulitis. Remainder of the colon unremarkable. Surgically absent appendix. Vascular/Lymphatic: Moderate to severe aorto-iliofemoral atherosclerosis without evidence of aneurysm. Normal-appearing portal venous and systemic venous systems. No pathologic lymphadenopathy. Reproductive: Prostate gland and seminal vesicles normal in size and appearance for age. Note is made of BILATERAL scrotal hydroceles. Other: Edema/inflammatory changes are present throughout the lesser sac, in the peripancreatic region, with fluid in the paracolic gutters bilaterally, interloop fluid in the jejunum, and small amount of dependent ascites in the pelvis. Small RIGHT inguinal hernia containing a small amount of ascitic fluid. Musculoskeletal: Severe degenerative  disc disease at L4-5 and L5-S1. Severe multifactorial spinal stenosis at L4-5. No acute findings. IMPRESSION: 1. Severe acute pancreatitis.  No evidence of pancreatic necrosis. 2. Secondary inflammation of a several centimeter segment of proximal jejunum in the upper abdomen. 3. Mild hepatomegaly without focal hepatic parenchymal abnormality. 4. Numerous exophytic masses involving both kidneys, likely hyperdense cysts as they have been present on prior examinations. 5. Sigmoid colon diverticulosis without evidence  of acute diverticulitis. 6. Moderate-sized left pleural effusion and associated passive atelectasis and/or pneumonia in the left lower lobe. 7. Moderate amount of ascites. 8. Bilateral scrotal hydroceles. 9. Severe multifactorial spinal stenosis at L4-5. 10. Stable chronic calcified cyst involving the spleen. 11.  Aortic Atherosclerosis (ICD10-170.0) Electronically Signed   By: Evangeline Dakin M.D.   On: 11/07/2018 09:46    Microbiology: No results found for this or any previous visit (from the past 240 hour(s)).   Labs: Basic Metabolic Panel: Recent Labs  Lab 11/06/18 0341 11/07/18 0751 11/08/18 0434 11/08/18 1153 11/09/18 0304 11/10/18 0317 11/11/18 0503  NA 134* 132* 133*  --  132* 133* 133*  K 4.7 4.4 3.6  --  3.6 3.4* 3.7  CL 97* 96* 96*  --  96* 96* 96*  CO2 26 25 24   --  26 26 27   GLUCOSE 100* 116* 81  --  102* 113* 125*  BUN 19 21* 16  --  11 7 8   CREATININE 1.18 1.18 1.13  --  0.96 0.93 0.93  CALCIUM 9.2 9.6 8.5*  --  8.3* 8.5* 8.4*  MG 1.9  --   --   --  1.7 1.7 2.0  PHOS  --   --   --  4.2  --   --   --    Liver Function Tests: Recent Labs  Lab 11/06/18 0341 11/07/18 0751 11/08/18 0434 11/09/18 0304 11/10/18 0317  AST 15 18 16 15 17   ALT 12 16 16 14 18   ALKPHOS 57 53 47 50 78  BILITOT 1.1 1.1 1.2 0.8 0.8  PROT 6.1* 6.8 5.6* 5.4* 5.3*  ALBUMIN 2.7* 3.0* 2.2* 2.1* 2.1*   Recent Labs  Lab 11/05/18 0243 11/07/18 0751 11/08/18 0434  LIPASE 689* 84* 59*   No results for input(s): AMMONIA in the last 168 hours. CBC: Recent Labs  Lab 11/06/18 0341 11/07/18 0751 11/08/18 0434 11/09/18 0304 11/10/18 0317 11/11/18 0503  WBC 19.4* 15.9* 13.8* 13.5* 12.2* 12.0*  NEUTROABS 16.4* 13.5*  --  10.4* 9.2* 8.6*  HGB 14.0 14.5 11.0* 11.1* 11.2* 10.7*  HCT 43.3 44.3 34.0* 33.2* 33.8* 31.8*  MCV 93.5 94.9 92.1 92.0 90.4 90.3  PLT 396 402* 375 368 390 440*   Cardiac Enzymes: Recent Labs  Lab 11/04/18 1607 11/04/18 2242  TROPONINI <0.03 <0.03   BNP: BNP  (last 3 results) Recent Labs    11/04/18 0705  BNP 95.7    ProBNP (last 3 results) No results for input(s): PROBNP in the last 8760 hours.  CBG: No results for input(s): GLUCAP in the last 168 hours.     Signed:  Irine Seal MD.  [facility] 11/11/2018, 12:58 PM

## 2018-11-11 NOTE — Telephone Encounter (Addendum)
Left message for patient to call back.  ===View-only below this line=== ----- Message ----- From: Jerene Bears, MD Sent: 11/10/2018   4:46 PM EDT To: Larina Bras, CMA  Virtual visit in 2 to 4 weeks, hospitalized with recurrent pancreatitis JMP

## 2018-11-15 NOTE — Telephone Encounter (Addendum)
Patient has already scheduled an appointment for follow up on 11/25/18 at 1100 am.

## 2018-11-24 ENCOUNTER — Encounter: Payer: Self-pay | Admitting: *Deleted

## 2018-11-25 ENCOUNTER — Other Ambulatory Visit: Payer: Self-pay

## 2018-11-25 ENCOUNTER — Inpatient Hospital Stay: Payer: Self-pay | Admitting: Family Medicine

## 2018-11-25 ENCOUNTER — Ambulatory Visit: Payer: Self-pay | Admitting: Internal Medicine

## 2018-11-25 NOTE — Progress Notes (Signed)
Patient had a scheduled office visit/virtual visit for hospital follow-up with me today.  I attempted to reach him by phone, via the Zoom app and Doximity app.   I was unable to reach him I did leave him a message stating that he missed his follow-up appointment Follow-up can be scheduled when we hear from him

## 2019-01-07 ENCOUNTER — Other Ambulatory Visit: Payer: Self-pay

## 2019-01-07 ENCOUNTER — Inpatient Hospital Stay (HOSPITAL_COMMUNITY)
Admission: EM | Admit: 2019-01-07 | Discharge: 2019-01-11 | DRG: 439 | Disposition: A | Discharge status: Home / Self Care | Payer: Self-pay | Attending: Student in an Organized Health Care Education/Training Program | Admitting: Student in an Organized Health Care Education/Training Program

## 2019-01-07 ENCOUNTER — Observation Stay (HOSPITAL_COMMUNITY): Payer: Self-pay

## 2019-01-07 ENCOUNTER — Encounter (HOSPITAL_COMMUNITY): Payer: Self-pay | Admitting: Emergency Medicine

## 2019-01-07 DIAGNOSIS — K859 Acute pancreatitis without necrosis or infection, unspecified: Principal | ICD-10-CM | POA: Diagnosis present

## 2019-01-07 DIAGNOSIS — Z801 Family history of malignant neoplasm of trachea, bronchus and lung: Secondary | ICD-10-CM

## 2019-01-07 DIAGNOSIS — F17210 Nicotine dependence, cigarettes, uncomplicated: Secondary | ICD-10-CM | POA: Diagnosis present

## 2019-01-07 DIAGNOSIS — K861 Other chronic pancreatitis: Secondary | ICD-10-CM

## 2019-01-07 DIAGNOSIS — Z8051 Family history of malignant neoplasm of kidney: Secondary | ICD-10-CM

## 2019-01-07 DIAGNOSIS — I700 Atherosclerosis of aorta: Secondary | ICD-10-CM | POA: Diagnosis present

## 2019-01-07 DIAGNOSIS — E785 Hyperlipidemia, unspecified: Secondary | ICD-10-CM | POA: Diagnosis present

## 2019-01-07 DIAGNOSIS — Z833 Family history of diabetes mellitus: Secondary | ICD-10-CM

## 2019-01-07 DIAGNOSIS — K862 Cyst of pancreas: Secondary | ICD-10-CM

## 2019-01-07 DIAGNOSIS — K635 Polyp of colon: Secondary | ICD-10-CM | POA: Diagnosis present

## 2019-01-07 DIAGNOSIS — I130 Hypertensive heart and chronic kidney disease with heart failure and stage 1 through stage 4 chronic kidney disease, or unspecified chronic kidney disease: Secondary | ICD-10-CM | POA: Diagnosis present

## 2019-01-07 DIAGNOSIS — Z803 Family history of malignant neoplasm of breast: Secondary | ICD-10-CM

## 2019-01-07 DIAGNOSIS — Z87891 Personal history of nicotine dependence: Secondary | ICD-10-CM

## 2019-01-07 DIAGNOSIS — I5032 Chronic diastolic (congestive) heart failure: Secondary | ICD-10-CM | POA: Diagnosis present

## 2019-01-07 DIAGNOSIS — Z79899 Other long term (current) drug therapy: Secondary | ICD-10-CM

## 2019-01-07 DIAGNOSIS — Z1159 Encounter for screening for other viral diseases: Secondary | ICD-10-CM

## 2019-01-07 DIAGNOSIS — R634 Abnormal weight loss: Secondary | ICD-10-CM

## 2019-01-07 DIAGNOSIS — R748 Abnormal levels of other serum enzymes: Secondary | ICD-10-CM

## 2019-01-07 DIAGNOSIS — N182 Chronic kidney disease, stage 2 (mild): Secondary | ICD-10-CM | POA: Diagnosis present

## 2019-01-07 DIAGNOSIS — I5030 Unspecified diastolic (congestive) heart failure: Secondary | ICD-10-CM

## 2019-01-07 DIAGNOSIS — K852 Alcohol induced acute pancreatitis without necrosis or infection: Secondary | ICD-10-CM

## 2019-01-07 DIAGNOSIS — I10 Essential (primary) hypertension: Secondary | ICD-10-CM | POA: Diagnosis present

## 2019-01-07 DIAGNOSIS — K529 Noninfective gastroenteritis and colitis, unspecified: Secondary | ICD-10-CM

## 2019-01-07 DIAGNOSIS — R079 Chest pain, unspecified: Secondary | ICD-10-CM

## 2019-01-07 DIAGNOSIS — D734 Cyst of spleen: Secondary | ICD-10-CM

## 2019-01-07 DIAGNOSIS — Z9049 Acquired absence of other specified parts of digestive tract: Secondary | ICD-10-CM

## 2019-01-07 LAB — CBC WITH DIFFERENTIAL/PLATELET
Abs Immature Granulocytes: 0.02 10*3/uL (ref 0.00–0.07)
Basophils Absolute: 0 10*3/uL (ref 0.0–0.1)
Basophils Relative: 0 %
Eosinophils Absolute: 0.3 10*3/uL (ref 0.0–0.5)
Eosinophils Relative: 3 %
HCT: 42.7 % (ref 39.0–52.0)
Hemoglobin: 13.9 g/dL (ref 13.0–17.0)
Immature Granulocytes: 0 %
Lymphocytes Relative: 29 %
Lymphs Abs: 2.6 10*3/uL (ref 0.7–4.0)
MCH: 29.4 pg (ref 26.0–34.0)
MCHC: 32.6 g/dL (ref 30.0–36.0)
MCV: 90.3 fL (ref 80.0–100.0)
Monocytes Absolute: 0.8 10*3/uL (ref 0.1–1.0)
Monocytes Relative: 9 %
Neutro Abs: 5.3 10*3/uL (ref 1.7–7.7)
Neutrophils Relative %: 59 %
Platelets: 484 10*3/uL — ABNORMAL HIGH (ref 150–400)
RBC: 4.73 MIL/uL (ref 4.22–5.81)
RDW: 15.2 % (ref 11.5–15.5)
WBC: 9 10*3/uL (ref 4.0–10.5)
nRBC: 0 % (ref 0.0–0.2)

## 2019-01-07 LAB — COMPREHENSIVE METABOLIC PANEL
ALT: 14 U/L (ref 0–44)
AST: 14 U/L — ABNORMAL LOW (ref 15–41)
Albumin: 4.1 g/dL (ref 3.5–5.0)
Alkaline Phosphatase: 59 U/L (ref 38–126)
Anion gap: 11 (ref 5–15)
BUN: 18 mg/dL (ref 6–20)
CO2: 25 mmol/L (ref 22–32)
Calcium: 10.1 mg/dL (ref 8.9–10.3)
Chloride: 101 mmol/L (ref 98–111)
Creatinine, Ser: 1.18 mg/dL (ref 0.61–1.24)
GFR calc Af Amer: >60 mL/min (ref >60)
GFR calc non Af Amer: >60 mL/min (ref >60)
Glucose, Bld: 146 mg/dL — ABNORMAL HIGH (ref 70–99)
Potassium: 4.3 mmol/L (ref 3.5–5.1)
Sodium: 137 mmol/L (ref 135–145)
Total Bilirubin: 0.6 mg/dL (ref 0.3–1.2)
Total Protein: 7.9 g/dL (ref 6.5–8.1)

## 2019-01-07 LAB — TSH: TSH: 0.696 u[IU]/mL (ref 0.350–4.500)

## 2019-01-07 LAB — LIPID PANEL
Cholesterol: 199 mg/dL (ref 0–200)
HDL: 32 mg/dL — ABNORMAL LOW (ref >40)
LDL Cholesterol: 139 mg/dL — ABNORMAL HIGH (ref 0–99)
Total CHOL/HDL Ratio: 6.2 RATIO
Triglycerides: 140 mg/dL (ref <150)
VLDL: 28 mg/dL (ref 0–40)

## 2019-01-07 LAB — SEDIMENTATION RATE: Sed Rate: 35 mm/hr — ABNORMAL HIGH (ref 0–16)

## 2019-01-07 LAB — C-REACTIVE PROTEIN: CRP: 1.1 mg/dL — ABNORMAL HIGH (ref <1.0)

## 2019-01-07 LAB — LIPASE, BLOOD: Lipase: 1210 U/L — ABNORMAL HIGH (ref 11–51)

## 2019-01-07 LAB — ETHANOL: Alcohol, Ethyl (B): <10 mg/dL (ref <10)

## 2019-01-07 LAB — MRSA PCR SCREENING: MRSA by PCR: NEGATIVE

## 2019-01-07 MED ORDER — HYDROMORPHONE HCL 1 MG/ML IJ SOLN
1.0000 mg | Freq: Once | INTRAMUSCULAR | Status: AC
  Administered 2019-01-07: 1 mg via INTRAVENOUS
  Filled 2019-01-07: qty 1

## 2019-01-07 MED ORDER — AMLODIPINE BESYLATE 10 MG PO TABS
10.0000 mg | ORAL_TABLET | Freq: Every day | ORAL | Status: DC
  Administered 2019-01-07 – 2019-01-11 (×5): 10 mg via ORAL
  Filled 2019-01-07 (×5): qty 1

## 2019-01-07 MED ORDER — ONDANSETRON HCL 4 MG/2ML IJ SOLN: 4.0000 mg | Freq: Three times a day (TID) | INTRAMUSCULAR | Status: DC | PRN

## 2019-01-07 MED ORDER — SODIUM CHLORIDE 0.9 % IV BOLUS (SEPSIS)
1000.0000 mL | Freq: Once | INTRAVENOUS | Status: AC
  Administered 2019-01-07: 1000 mL via INTRAVENOUS

## 2019-01-07 MED ORDER — POLYETHYLENE GLYCOL 3350 17 G PO PACK
17.0000 g | PACK | Freq: Every day | ORAL | Status: DC | PRN
  Filled 2019-01-07: qty 1

## 2019-01-07 MED ORDER — CARVEDILOL 6.25 MG PO TABS
6.2500 mg | ORAL_TABLET | Freq: Two times a day (BID) | ORAL | Status: DC
  Administered 2019-01-07 – 2019-01-11 (×8): 6.25 mg via ORAL
  Filled 2019-01-07 (×8): qty 1

## 2019-01-07 MED ORDER — HYDROMORPHONE HCL 1 MG/ML IJ SOLN
1.0000 mg | INTRAMUSCULAR | Status: DC | PRN
  Administered 2019-01-07 – 2019-01-08 (×7): 1 mg via INTRAVENOUS
  Filled 2019-01-07 (×7): qty 1

## 2019-01-07 MED ORDER — SODIUM CHLORIDE 0.9 % IV SOLN
INTRAVENOUS | Status: DC
  Administered 2019-01-07 – 2019-01-11 (×12): via INTRAVENOUS

## 2019-01-07 MED ORDER — ACETAMINOPHEN 325 MG PO TABS: 650.0000 mg | ORAL_TABLET | Freq: Four times a day (QID) | ORAL | Status: DC | PRN

## 2019-01-07 MED ORDER — SODIUM CHLORIDE 0.9 % IV BOLUS
1000.0000 mL | Freq: Once | INTRAVENOUS | Status: AC
  Administered 2019-01-07: 1000 mL via INTRAVENOUS

## 2019-01-07 MED ORDER — IOHEXOL 300 MG/ML  SOLN
100.0000 mL | Freq: Once | INTRAMUSCULAR | Status: AC
  Administered 2019-01-07: 100 mL via INTRAVENOUS

## 2019-01-07 MED ORDER — ENOXAPARIN SODIUM 40 MG/0.4ML ~~LOC~~ SOLN
40.0000 mg | SUBCUTANEOUS | Status: DC
  Administered 2019-01-07 – 2019-01-10 (×4): 40 mg via SUBCUTANEOUS
  Filled 2019-01-07 (×4): qty 0.4

## 2019-01-07 MED ORDER — ONDANSETRON HCL 4 MG/2ML IJ SOLN
4.0000 mg | Freq: Once | INTRAMUSCULAR | Status: AC
  Administered 2019-01-07: 4 mg via INTRAVENOUS
  Filled 2019-01-07: qty 2

## 2019-01-07 MED ORDER — FOLIC ACID 5 MG/ML IJ SOLN
1.0000 mg | Freq: Every day | INTRAMUSCULAR | Status: DC
  Administered 2019-01-07 – 2019-01-11 (×5): 1 mg via INTRAVENOUS
  Filled 2019-01-07 (×5): qty 0.2

## 2019-01-07 MED ORDER — ACETAMINOPHEN 650 MG RE SUPP: 650.0000 mg | Freq: Four times a day (QID) | RECTAL | Status: DC | PRN

## 2019-01-07 MED ORDER — THIAMINE HCL 100 MG/ML IJ SOLN
100.0000 mg | Freq: Every day | INTRAMUSCULAR | Status: DC
  Administered 2019-01-07 – 2019-01-11 (×5): 100 mg via INTRAVENOUS
  Filled 2019-01-07 (×5): qty 2

## 2019-01-07 NOTE — TOC Initial Note (Addendum)
Transition of Care [facility]) - Initial/Assessment Note    Patient Details  Name: Paul [name] MRN: [id number] Date of Birth: 1965-10-14  Transition of Care [facility]) CM/SW Contact:    Marilu Favre, RN Phone Number: 01/07/2019, 1:01 PM  Clinical Narrative:                 Patient from home with family. Recently moved to [employer]. No insurance . Patient agreeable to follow up with [facility], same called closed for lunch until 2 pm will call back to schedule appointment. Called 1420 follow up appointments will not be released until Monday. Will call back on Monday. Patient also has information on discharge instructions.  Explained [facility] letter will continue to follow for discharge prescriptions.  Expected Discharge Plan: Home/Self Care Barriers to Discharge: Continued Medical Work up   Patient Goals and CMS Choice Patient states their goals for this hospitalization and ongoing recovery are:: to go home CMS Medicare.gov Compare Post Acute Care list provided to:: Patient Choice offered to / list presented to : NA  Expected Discharge Plan and Services Expected Discharge Plan: Home/Self Care In-house Referral: PCP / [facility], Financial Counselor Discharge Planning Services: CM Consult, Medication Assistance, [place] [facility], Follow-up appt scheduled   Living arrangements for the past 2 months: Single Family Home                 DME Arranged: N/A         HH Arranged: NA          Prior Living Arrangements/Services Living arrangements for the past 2 months: Single Family Home Lives with:: Relatives Patient language and need for interpreter reviewed:: Yes Do you feel safe going back to the place where you live?: Yes      Need for Family Participation in Patient Care: No (Comment) Care giver support system in place?: No (comment)   Criminal Activity/Legal Involvement Pertinent to Current Situation/Hospitalization: No - Comment as needed  Activities of  Daily Living      Permission Sought/Granted                  Emotional Assessment Appearance:: Appears stated age Attitude/Demeanor/Rapport: Engaged Affect (typically observed): Accepting Orientation: : Oriented to Self, Oriented to Place, Oriented to  Time, Oriented to Situation Alcohol / Substance Use: Alcohol Use Psych Involvement: No (comment)  Admission diagnosis:  Alcohol-induced acute pancreatitis without infection or necrosis [K85.20] Patient Active Problem List   Diagnosis Date Noted  . Pancreatitis 01/07/2019  . Colitis 01/07/2019  . Loss of weight 01/07/2019  . Hypoxia   . Acute recurrent pancreatitis 11/07/2018  . Atypical chest pain 11/04/2018  . Chronic diastolic CHF (congestive heart failure) ([place]) 10/11/2018  . CKD (chronic kidney disease) stage 2, GFR 60-89 ml/min 10/11/2018  . Essential hypertension 05/22/2018  . Dyslipidemia 05/22/2018  . Renal lesion 05/22/2018  . Coronary artery calcification 05/22/2018  . Hypercalcemia 05/22/2018  . Tobacco abuse 05/22/2018  . Acute pancreatitis 05/20/2018  . Alcohol intoxication ([place]) 02/03/2012   PCP:  No primary care provider on file. Pharmacy:   [id number] [place], [address] [place] [address] Phone: [phone] Fax: [phone]     Social Determinants of Health (SDOH) Interventions    Readmission Risk Interventions Readmission Risk Prevention Plan 11/10/2018  PCP or Specialist Appt within 5-7 Days Not Complete  Not Complete comments Follow-up appt made with [facility]  for 4/30 (this was the first availabale appt they had)  Some recent data might be hidden

## 2019-01-07 NOTE — Progress Notes (Signed)
Pt arrived to floor from ED in stable condition. Pt made comfortable and instructed on how to use call bell.  When receiving report from ED RN, this RN asked her to recheck BP and make MD aware before bringing pt to the floor.  This was apparently not done and BP on arrival to room was 173/138.  All other VS WNL and pt asymptomatic.  Pain medication given as pt states pain is an 8/10 and MD notified.  Paul [name]

## 2019-01-07 NOTE — ED Notes (Signed)
Patient transported to CT 
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
## 2019-01-07 NOTE — Progress Notes (Signed)
Pt's BP now 182/118 after giving BP medication earlier.  All other VS WNL and pt asymptomatic.  MD notified and no new orders received at this time.  Will continue to monitor.  Eliezer Bottom [place]

## 2019-01-07 NOTE — ED Triage Notes (Signed)
Pt c/o 10/10 left lower abd pain since 4 am today, no nausea or vomiting.
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
## 2019-01-07 NOTE — ED Notes (Signed)
Pt returned to room from CT
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
## 2019-01-07 NOTE — ED Provider Notes (Signed)
The patient was accepted at change of shift, has significantly elevated lipase, I discussed the care with the internal medicine resident who will admit.   Noemi Chapel, MD 01/07/19 [phone]

## 2019-01-08 LAB — COMPREHENSIVE METABOLIC PANEL
ALT: 9 U/L (ref 0–44)
AST: 11 U/L — ABNORMAL LOW (ref 15–41)
Albumin: 3.1 g/dL — ABNORMAL LOW (ref 3.5–5.0)
Alkaline Phosphatase: 48 U/L (ref 38–126)
Anion gap: 8 (ref 5–15)
BUN: 18 mg/dL (ref 6–20)
CO2: 23 mmol/L (ref 22–32)
Calcium: 8.9 mg/dL (ref 8.9–10.3)
Chloride: 104 mmol/L (ref 98–111)
Creatinine, Ser: 1.09 mg/dL (ref 0.61–1.24)
GFR calc Af Amer: >60 mL/min (ref >60)
GFR calc non Af Amer: >60 mL/min (ref >60)
Glucose, Bld: 115 mg/dL — ABNORMAL HIGH (ref 70–99)
Potassium: 4.7 mmol/L (ref 3.5–5.1)
Sodium: 135 mmol/L (ref 135–145)
Total Bilirubin: 1 mg/dL (ref 0.3–1.2)
Total Protein: 6.2 g/dL — ABNORMAL LOW (ref 6.5–8.1)

## 2019-01-08 LAB — NOVEL CORONAVIRUS, NAA (HOSP ORDER, SEND-OUT TO REF LAB; TAT 18-24 HRS): SARS-CoV-2, NAA: NOT DETECTED

## 2019-01-08 LAB — CBC
HCT: 41.6 % (ref 39.0–52.0)
Hemoglobin: 13.4 g/dL (ref 13.0–17.0)
MCH: 29.3 pg (ref 26.0–34.0)
MCHC: 32.2 g/dL (ref 30.0–36.0)
MCV: 91 fL (ref 80.0–100.0)
Platelets: 417 10*3/uL — ABNORMAL HIGH (ref 150–400)
RBC: 4.57 MIL/uL (ref 4.22–5.81)
RDW: 15.7 % — ABNORMAL HIGH (ref 11.5–15.5)
WBC: 10.7 10*3/uL — ABNORMAL HIGH (ref 4.0–10.5)
nRBC: 0 % (ref 0.0–0.2)

## 2019-01-08 LAB — IGG 4: IgG, Subclass 4: 100 mg/dL — ABNORMAL HIGH (ref 2–96)

## 2019-01-08 MED ORDER — HYDROCODONE-ACETAMINOPHEN 5-325 MG PO TABS
1.0000 | ORAL_TABLET | Freq: Four times a day (QID) | ORAL | Status: DC
  Administered 2019-01-08 – 2019-01-09 (×5): 2 via ORAL
  Filled 2019-01-08 (×5): qty 2

## 2019-01-08 MED ORDER — HYDROMORPHONE HCL 1 MG/ML IJ SOLN
0.5000 mg | INTRAMUSCULAR | Status: DC | PRN
  Administered 2019-01-09: 0.5 mg via INTRAVENOUS
  Filled 2019-01-08: qty 1

## 2019-01-08 MED ORDER — AMOXICILLIN-POT CLAVULANATE 875-125 MG PO TABS
1.0000 | ORAL_TABLET | Freq: Three times a day (TID) | ORAL | Status: DC
  Administered 2019-01-08 – 2019-01-10 (×7): 1 via ORAL
  Filled 2019-01-08 (×7): qty 1

## 2019-01-08 MED ORDER — BISACODYL 10 MG RE SUPP
10.0000 mg | Freq: Every day | RECTAL | Status: DC | PRN
  Administered 2019-01-08: 10 mg via RECTAL
  Filled 2019-01-08: qty 1

## 2019-01-08 MED ORDER — HYDROMORPHONE HCL 1 MG/ML IJ SOLN: 1.0000 mg | INTRAMUSCULAR | Status: DC | PRN

## 2019-01-08 NOTE — Progress Notes (Signed)
Patient updated significant other and son about condition, decline for me to call his mother (contact person) for update.

## 2019-01-08 NOTE — Progress Notes (Addendum)
   Subjective: *The patient endorsed persistent even worsening abdominal pain overnight now progressing into the LLQ predominantly. He continues to deny loose watery stool or a bowel movement since 6/11 afternoon which was normal in consistency and absent blood. He denied a history of diarrhea, loose stool, bloody stool, melena, hematemesis, vomiting, or other bowel changes. He endorses mild fever.   Objective:  Vital signs in last 24 hours: Vitals:   01/07/19 0948 01/07/19 1440 01/07/19 2100 01/08/19 0531  BP: (!) 173/138 (!) 182/118 (!) 163/102 (!) 159/96  Pulse: 73 86 71 92  Resp: 20 18 20 18   Temp: 98 F (36.7 C)  98.6 F (37 C) 97.9 F (36.6 C)  TempSrc: Oral Oral  Oral  SpO2: 100% 99% 100% 98%  Weight: 74.7 kg     Height: 6' (1.829 m)      General: A/O x4, in no acute distress, afebrile, nondiaphoretic Cardio: RRR, no mrg's  Pulmonary: CTA bilaterally, no wheezing or crackles  Abdomen: Bowel sounds normal, soft, tender to palpation in the LLQ moderate, generally tender MSK: BLE nontender, nonedematous Psych: Appropriate affect, not depressed in appearance, engages well  Assessment/Plan:  Principal Problem:   Colitis Active Problems:   Essential hypertension   Pancreatitis   Loss of weight  Mr. Both is a 53 yo M w/  PMHx notable for HTN, dCHF, CKDII, and recent history of EtOH induced pancreatitis who presented to the ED for ABD pain and nausea. He was diagnosed with colitis due to CT findings of colonic inflammation in the splenic flexure. Lipase was elevated to 1200. He was admitted for pain control and bowel rest.   Abdominal pain and nausea: Patient with abdominal pain and nausea with CT findings of colitis at the splenic flexure. Currently he has no s/sx of infectious causes or symptoms of colitis. With his recurrent pancreatitis and findings concerning for possible colitis as well as weight loss, there is suspicion for an underlying autoimmune/inflammatory process  but this seems unlikely with CRP and sed rate minimally elevated lowering concern for autoimmune. After reviewing the images today I feel there may be more concern for diverticulitis.  Slight leukocytosis at 10.7 today, patient endorsing fever, LLQ pain severe today, denied bowel movement since Thursday which was formed. Patient denied loose water stool or abnormal stool in the past several months. Does not recall his last loose watery stool. Given >24hrs without stool C. Diff discontinued. IgG slightly elevated. --NPO, IVF --f/u anti-ttg ab, fecal calprotectin --Given prior diverticula of the sigmoid on CT in April and current symptoms will treat for moderate diverticular infection.  -- Augmentin 875mg  PO TID --based on progression we will consider GI inpatient vs having him follow up outpatient. --Pain control with norco 5-10mg  Q6 hours and dilaudid for breakthrough  Weight loss: Patient with ~8kg weight loss since last year that he endorses as unintentional, in setting of recurrent pancreatitis and now possible colitis. TSH was wnl. Other consideration is cancer - family history of renal cancer with patient having exophytic renal cysts (though these are thought to be hemorrhagic rather than neoplastic); he is a smoker. Not up to date on colon cancer screening. -- Will monitor weight and continue evaluation of his colitis  Diet: NPO for now IVF: NS VTE ppx: lovenox Code: FULL Dispo: Anticipated discharge in approximately 2-3 day(s).   Kathi Ludwig, MD 01/08/2019, 6:40 AM Pager: Pager# [phone]

## 2019-01-08 NOTE — Plan of Care (Signed)

## 2019-01-09 LAB — CBC
HCT: 33.8 % — ABNORMAL LOW (ref 39.0–52.0)
Hemoglobin: 11.1 g/dL — ABNORMAL LOW (ref 13.0–17.0)
MCH: 30.2 pg (ref 26.0–34.0)
MCHC: 32.8 g/dL (ref 30.0–36.0)
MCV: 91.8 fL (ref 80.0–100.0)
Platelets: 346 10*3/uL (ref 150–400)
RBC: 3.68 MIL/uL — ABNORMAL LOW (ref 4.22–5.81)
RDW: 15.9 % — ABNORMAL HIGH (ref 11.5–15.5)
WBC: 12.7 10*3/uL — ABNORMAL HIGH (ref 4.0–10.5)
nRBC: 0 % (ref 0.0–0.2)

## 2019-01-09 LAB — BASIC METABOLIC PANEL
Anion gap: 8 (ref 5–15)
BUN: 16 mg/dL (ref 6–20)
CO2: 22 mmol/L (ref 22–32)
Calcium: 8.4 mg/dL — ABNORMAL LOW (ref 8.9–10.3)
Chloride: 104 mmol/L (ref 98–111)
Creatinine, Ser: 1 mg/dL (ref 0.61–1.24)
GFR calc Af Amer: >60 mL/min (ref >60)
GFR calc non Af Amer: >60 mL/min (ref >60)
Glucose, Bld: 110 mg/dL — ABNORMAL HIGH (ref 70–99)
Potassium: 3.8 mmol/L (ref 3.5–5.1)
Sodium: 134 mmol/L — ABNORMAL LOW (ref 135–145)

## 2019-01-09 MED ORDER — PEG 3350-KCL-NA BICARB-NACL 420 G PO SOLR
2000.0000 mL | Freq: Once | ORAL | Status: AC
  Administered 2019-01-10: 2000 mL via ORAL

## 2019-01-09 MED ORDER — HYDROCODONE-ACETAMINOPHEN 5-325 MG PO TABS
1.0000 | ORAL_TABLET | Freq: Four times a day (QID) | ORAL | Status: DC | PRN
  Administered 2019-01-09 – 2019-01-11 (×7): 2 via ORAL
  Filled 2019-01-09 (×7): qty 2

## 2019-01-09 MED ORDER — PEG 3350-KCL-NA BICARB-NACL 420 G PO SOLR
2000.0000 mL | Freq: Once | ORAL | Status: AC
  Administered 2019-01-09: 2000 mL via ORAL
  Filled 2019-01-09: qty 4000

## 2019-01-09 MED ORDER — KETOROLAC TROMETHAMINE 15 MG/ML IJ SOLN
15.0000 mg | Freq: Four times a day (QID) | INTRAMUSCULAR | Status: AC | PRN
  Administered 2019-01-09 – 2019-01-11 (×5): 15 mg via INTRAVENOUS
  Filled 2019-01-09 (×5): qty 1

## 2019-01-09 MED ORDER — ALUM & MAG HYDROXIDE-SIMETH 200-200-20 MG/5ML PO SUSP
30.0000 mL | Freq: Four times a day (QID) | ORAL | Status: DC | PRN
  Administered 2019-01-09 – 2019-01-11 (×2): 30 mL via ORAL
  Filled 2019-01-09 (×2): qty 30

## 2019-01-09 MED ORDER — PEG 3350-KCL-NA BICARB-NACL 420 G PO SOLR: 4000.0000 mL | Freq: Once | ORAL | Status: DC

## 2019-01-09 NOTE — Plan of Care (Signed)
  Problem: Coping: Goal: Level of anxiety will decrease Outcome: Progressing   Problem: Pain Managment: Goal: General experience of comfort will improve Outcome: Progressing   Problem: Safety: Goal: Ability to remain free from injury will improve Outcome: Progressing   
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
## 2019-01-09 NOTE — Progress Notes (Addendum)
Subjective: He reports of abdominal pain that is worse with body position mostly localized to the LLQ particularly in a 2"x2" area. He denies diarrhea, nausea, vomiting. He reports of lower abdominal pain with defecation yesterday.  In regards to his prior episodes of pancreatitis, he states this presentation feels different.   Objective:  Vital signs in last 24 hours: Vitals:   01/07/19 2100 01/08/19 0531 01/08/19 2018 01/09/19 0508  BP: (!) 163/102 (!) 159/96 132/86 139/86  Pulse: 71 92 78 82  Resp: 20 18    Temp: 98.6 F (37 C) 97.9 F (36.6 C) 99.3 F (37.4 C) 98.6 F (37 C)  TempSrc:  Oral Oral Oral  SpO2: 100% 98% 96% 96%  Weight:      Height:       General: A/O x4, in no acute distress, afebrile, nondiaphoretic Cardio: RRR, no mrg's  Pulmonary: CTA bilaterally, no wheezing or crackles  Abdomen: Bowel sounds normal, guarding (+), tender to palpation LLQ, rebound (+) nontender  MSK: BLE nontender, nonedematous Psych: Appropriate affect, not depressed in appearance, engages well  Assessment/Plan:  Principal Problem:   Colitis Active Problems:   Essential hypertension   Pancreatitis   Loss of weight  Mr. Minter is a 53 yo M w/  PMHx notable for HTN, dCHF, CKDII, and recent history of EtOH induced pancreatitis who presented to the ED for ABD pain and nausea. He was diagnosed with colitis due to CT findings of colonic inflammation in the splenic flexure. Lipase was elevated to 1200. He was admitted for pain control and bowel rest.   Abdominal pain and nausea: Etiology uncertain, consideration given for pancreatitis, diverticulitis and ischemic colitis. I am concerned that given the somatic nature of his pain her perhaps has developed peritonitis from a possible diverticular perforation vs ischemic colitis progression. He does not appear frankly septic or with an acute abdomen and is clinically improving somewhat.  Patient with abdominal pain and nausea with CT findings of  colitis at the splenic flexure and proximal descending colon with free fluid in the left pericolic gutter. Currently he has no s/sx of infectious causes or symptoms of colitis. With his recurrent pancreatitis and findings concerning for possible colitis as well as weight loss, there is suspicion for an underlying autoimmune/inflammatory process but this seems unlikely with CRP and sed rate minimally elevated lowering concern for autoimmune. After reviewing the images today I feel there may be more concern for diverticulitis.  Leukocytosis worse today at 12.7, patient no longer endorsing fever, but LLQ more localized today. Endorses a bowel movement which was partially formed. Patient denied loose water stool or abnormal stool in the past several months. Does not recall his last loose watery stool.  --Clear liquid diet, IVF --f/u anti-ttg ab, fecal calprotectin -- Augmentin 875mg  PO TID --Consult placed to [facility] for consideration of colonoscopy who have agreed to see him, appreciate their recommendations --Pain control with norco 5-10mg  Q6 hours and Toradol 15mg  for breakthrough  Weight loss: Patient with ~8kg weight loss since last year that he endorses as unintentional, in setting of recurrent pancreatitis and now possible colitis. TSH was wnl.Other consideration is cancer - family history of renal cancer with patient having exophytic renal cysts (though these are thought to be hemorrhagic rather than neoplastic); he is a smoker. Not up to date on colon cancer screening. -- Will monitor weight and continue evaluation of his colitis  Diet: Clears IVF: NS VTE ppx: lovenox Code: FULL Dispo: Anticipated discharge in  approximately 1-2 day(s).   Kathi Ludwig, MD 01/09/2019, 7:05 AM Pager: Pager# [phone]

## 2019-01-09 NOTE — Anesthesia Preprocedure Evaluation (Addendum)
Anesthesia Evaluation  Patient identified by MRN, date of birth, ID band Patient awake    Reviewed: Allergy & Precautions, NPO status , Patient's Chart, lab work & pertinent test results, reviewed documented beta blocker date and time   History of Anesthesia Complications Negative for: history of anesthetic complications  Airway Mallampati: II  TM Distance: >3 FB Neck ROM: Full    Dental  (+) Dental Advisory Given, Poor Dentition, Missing, Chipped   Pulmonary Current Smoker,  01/07/2019 novel coronavirus NEG   breath sounds clear to auscultation       Cardiovascular hypertension, Pt. on medications and Pt. on home beta blockers (-) angina Rhythm:Regular Rate:Normal  '19 ECHO: EF 60-65%, valves OK, Doppler parameters are consistent with a reversible restrictive pattern, indicative of decreased left ventricular diastolic compliance and/or increased left atrial pressure (grade 3 diastolic dysfunction).   Neuro/Psych negative neurological ROS     GI/Hepatic negative GI ROS, Neg liver ROS,   Endo/Other  negative endocrine ROS  Renal/GU Renal InsufficiencyRenal disease     Musculoskeletal   Abdominal   Peds  Hematology  (+) Blood dyscrasia (Hb 10.1), anemia ,   Anesthesia Other Findings   Reproductive/Obstetrics                            Anesthesia Physical Anesthesia Plan  ASA: III  Anesthesia Plan: MAC   Post-op Pain Management:    Induction:   PONV Risk Score and Plan: Treatment may vary due to age or medical condition  Airway Management Planned: Natural Airway and Simple Face Mask  Additional Equipment:   Intra-op Plan:   Post-operative Plan:   Informed Consent: I have reviewed the patients History and Physical, chart, labs and discussed the procedure including the risks, benefits and alternatives for the proposed anesthesia with the patient or authorized representative who has  indicated his/her understanding and acceptance.     Dental advisory given  Plan Discussed with: CRNA and Surgeon  Anesthesia Plan Comments:         Anesthesia Quick Evaluation

## 2019-01-09 NOTE — Progress Notes (Signed)
Pt given Nulytely and instructed on how to drink it.
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
## 2019-01-10 ENCOUNTER — Encounter (HOSPITAL_COMMUNITY): Payer: Self-pay | Admitting: Anesthesiology

## 2019-01-10 ENCOUNTER — Encounter (HOSPITAL_COMMUNITY)
Admission: EM | Disposition: A | Discharge status: Home / Self Care | Payer: Self-pay | Source: Home / Self Care | Attending: Internal Medicine

## 2019-01-10 ENCOUNTER — Inpatient Hospital Stay (HOSPITAL_COMMUNITY): Payer: Self-pay | Admitting: Anesthesiology

## 2019-01-10 DIAGNOSIS — K635 Polyp of colon: Secondary | ICD-10-CM

## 2019-01-10 DIAGNOSIS — I110 Hypertensive heart disease with heart failure: Secondary | ICD-10-CM

## 2019-01-10 DIAGNOSIS — I5032 Chronic diastolic (congestive) heart failure: Secondary | ICD-10-CM

## 2019-01-10 HISTORY — PX: BIOPSY: SHX5522

## 2019-01-10 HISTORY — PX: COLONOSCOPY WITH PROPOFOL: SHX5780

## 2019-01-10 HISTORY — PX: POLYPECTOMY: SHX5525

## 2019-01-10 LAB — CBC
HCT: 31 % — ABNORMAL LOW (ref 39.0–52.0)
Hemoglobin: 10.1 g/dL — ABNORMAL LOW (ref 13.0–17.0)
MCH: 29.4 pg (ref 26.0–34.0)
MCHC: 32.6 g/dL (ref 30.0–36.0)
MCV: 90.1 fL (ref 80.0–100.0)
Platelets: 327 10*3/uL (ref 150–400)
RBC: 3.44 MIL/uL — ABNORMAL LOW (ref 4.22–5.81)
RDW: 15.7 % — ABNORMAL HIGH (ref 11.5–15.5)
WBC: 13.2 10*3/uL — ABNORMAL HIGH (ref 4.0–10.5)
nRBC: 0 % (ref 0.0–0.2)

## 2019-01-10 SURGERY — COLONOSCOPY WITH PROPOFOL
Anesthesia: Monitor Anesthesia Care

## 2019-01-10 MED ORDER — SENNOSIDES-DOCUSATE SODIUM 8.6-50 MG PO TABS: 1.0000 | ORAL_TABLET | Freq: Every evening | ORAL | Status: DC | PRN

## 2019-01-10 MED ORDER — LACTATED RINGERS IV SOLN
INTRAVENOUS | Status: DC | PRN
  Administered 2019-01-10: 09:00:00 via INTRAVENOUS

## 2019-01-10 MED ORDER — PROPOFOL 500 MG/50ML IV EMUL
INTRAVENOUS | Status: DC | PRN
  Administered 2019-01-10: 50 ug/kg/min via INTRAVENOUS

## 2019-01-10 MED ORDER — LIDOCAINE HCL 1 % IJ SOLN
INTRAMUSCULAR | Status: DC | PRN
  Administered 2019-01-10: 40 mg via INTRADERMAL

## 2019-01-10 MED ORDER — PROPOFOL 10 MG/ML IV BOLUS
INTRAVENOUS | Status: DC | PRN
  Administered 2019-01-10: 40 mg via INTRAVENOUS
  Administered 2019-01-10: 20 mg via INTRAVENOUS
  Administered 2019-01-10: 30 mg via INTRAVENOUS
  Administered 2019-01-10: 20 mg via INTRAVENOUS
  Administered 2019-01-10: 30 mg via INTRAVENOUS

## 2019-01-10 MED ORDER — POLYETHYLENE GLYCOL 3350 17 G PO PACK: 17.0000 g | PACK | Freq: Every day | ORAL | Status: DC | PRN

## 2019-01-10 MED ORDER — LACTATED RINGERS IV SOLN
INTRAVENOUS | Status: DC
  Administered 2019-01-10: 09:00:00 via INTRAVENOUS

## 2019-01-10 MED ORDER — SODIUM CHLORIDE 0.9 % IV SOLN: INTRAVENOUS | Status: DC

## 2019-01-10 SURGICAL SUPPLY — 22 items

## 2019-01-10 NOTE — TOC Progression Note (Signed)
Transition of Care [facility]) - Progression Note    Patient Details  Name: Paul [name] MRN: [id number] Date of Birth: [birth year]  Transition of Care [facility]) CM/SW Contact  Zanylah Hardie, Edson Snowball, RN Phone Number: 01/10/2019, 10:32 AM  Clinical Narrative:     Scheduled appointment at [facility] for June 24 at 58 am  Will continue to follow for DC medications  Expected Discharge Plan: Home/Self Care Barriers to Discharge: Continued Medical Work up  Expected Discharge Plan and Services Expected Discharge Plan: Home/Self Care In-house Referral: PCP / [facility], Financial Counselor Discharge Planning Services: CM Consult, Medication Assistance, MATCH Program, Follow-up appt scheduled   Living arrangements for the past 2 months: Single Family Home Expected Discharge Date: 01/12/19               DME Arranged: N/A         HH Arranged: NA           Social Determinants of Health (SDOH) Interventions    Readmission Risk Interventions Readmission Risk Prevention Plan 11/10/2018  PCP or Specialist Appt within 5-7 Days Not Complete  Not Complete comments Follow-up appt made with [facility] for [facility] (this was the first availabale appt they had)  Some recent data might be hidden

## 2019-01-10 NOTE — Op Note (Signed)
Colonoscopy showed:  1 small cecal polyp, removed via biopsy forceps. 1 polyp in descending colon, removed via hot snare polypectomy. Otherwise normal colon and terminal ileum. No signs of colitis. Unremarkable retroflexion.  Recommend: Low fat diet due to recent pancreatitis. Avoid alcohol. Repeat colonoscopy for surveillance based on pathology of polyps, may be followed as outpatient. Please recall if needed.   Ronnette Juniper, MD

## 2019-01-10 NOTE — Progress Notes (Addendum)
   Subjective:  Paul [name] is a 53 y.o. M with PMH of HFpEF, CKD2, and recurrent pancreatitis admit for abdominal pain on hospital day 3  Paul [name] was examined and evaluated at bedside this AM. He states he was updated by GI regarding the planned colonoscopy today. His stools are clear after starting the colonoscopy prep. He mentions his stool prior to the prep appeared normal color without melena. He does not endorse severe abdominal pain and actually reports improved abdominal pain. He also denies nausea, vomiting, fever, chills. States that he is hungry.   Objective:  Vital signs in last 24 hours: Vitals:   01/10/19 0843 01/10/19 0953 01/10/19 1003 01/10/19 1013  BP: (!) 185/97 126/67 (!) 153/81 (!) 154/79  Pulse: 84 71 70 70  Resp: (!) 22 (!) 22 (!) 24 (!) 23  Temp: 99.5 F (37.5 C) 98.7 F (37.1 C)    TempSrc: Temporal Temporal    SpO2: 98% 100% 97% 96%  Weight:      Height:       Physical Exam  Constitutional: He is oriented to person, place, and time and well-developed, well-nourished, and in no distress. No distress.  Cardiovascular: Normal rate, regular rhythm, normal heart sounds and intact distal pulses.  No murmur heard. Pulmonary/Chest: Effort normal and breath sounds normal. He has no wheezes. He has no rales.  Abdominal: Soft. Bowel sounds are normal. There is abdominal tenderness (LLQ tenderness to palpation). There is rebound.  Musculoskeletal: Normal range of motion.        General: No edema.  Neurological: He is alert and oriented to person, place, and time.  Skin: Skin is warm and dry.   Assessment/Plan:  Principal Problem:   Colitis Active Problems:   Essential hypertension   Pancreatitis   Loss of weight  Paul [name] is a 54 y.o. M with PMH of HFpEF, and recurrent pancreatitis admit for colitis. GI was consulted yesterday with recommendation for colonoscopy this morning. Will f/u results. His abdominal pain has significantly improved and is currently  endorsing appetite. If the colonoscopy do not show any evidence of significant ischemic etiology of his abdominal pain, he could possibly be discharged soon on oral antibiotics for presumed diverticulitis based on negative work-up so far.  Abdominal pain 2/2 diverticulitis vs ischemic colitis Endorsing improving LLQ abdominal pain this AM. WBC 10.7->12.7->13.2 CT abd/pelvis w/ evidence of thickening colonic wall. Prior CT abd/pelvis w/ evidence of sigmoid colon diverticulosis. Elevated lipase but no epigastric tenderness. ESR 35, CRP 1.1.  - Appreciate GI recs: colonscopy this am - C/w Augmentin '875mg'$  TID - May consider alternate therapy (Bactrim+Flagyl) if worsening leukocytosis/fever/abdominal pain  Elevated lipase 2/2 chronic pancreatitis ON admit lipase of 1210. Had hx of recurrent pancreatitis earlier this year w/ multiple admissions. CT abd/pelvis as well as physical exam negative for acute pancreatitis this admission. IgG 4 negative for autoimmune pancreatitis - C/w monitor  Chronic Diastolic Heart Failure TTE  - c/w home meds: carvedilol 6.'25mg'$  BID  HTN - c/w home meds: amlodipine '10mg'$  daily  DVT prophx: Lovenox Diet: NPO Code: Full  Dispo: Anticipated discharge in approximately 1-2 day(s).   Paul Anis, MD 01/10/2019, 10:16 AM Pager: [phone]

## 2019-01-10 NOTE — Anesthesia Procedure Notes (Signed)
Procedure Name: MAC Date/Time: 01/10/2019 9:23 AM Performed by: Neldon Newport, CRNA Pre-anesthesia Checklist: Timeout performed, Patient being monitored, Emergency Drugs available, Suction available and Patient identified Oxygen Delivery Method: Nasal cannula Placement Confirmation: positive ETCO2

## 2019-01-10 NOTE — Anesthesia Postprocedure Evaluation (Signed)
Anesthesia Post Note  Patient: Donald Memoli  Procedure(s) Performed: COLONOSCOPY WITH PROPOFOL (N/A ) POLYPECTOMY BIOPSY     Patient location during evaluation: Endoscopy Anesthesia Type: MAC Level of consciousness: awake and alert, patient cooperative and oriented Pain management: pain level controlled Vital Signs Assessment: post-procedure vital signs reviewed and stable Respiratory status: spontaneous breathing, nonlabored ventilation and respiratory function stable Cardiovascular status: blood pressure returned to baseline and stable Postop Assessment: no apparent nausea or vomiting Anesthetic complications: no    Last Vitals:  Vitals:   01/10/19 1003 01/10/19 1013  BP: (!) 153/81 (!) 154/79  Pulse: 70 70  Resp: (!) 24 (!) 23  Temp:    SpO2: 97% 96%    Last Pain:  Vitals:   01/10/19 1200  TempSrc:   PainSc: 0-No pain                 Melton Walls,E. Ayani Ospina

## 2019-01-10 NOTE — Interval H&P Note (Signed)
History and Physical Interval Note: 52/male with recurrent pancreatitis, likely related to alcohol, with abnormal CT showing severe colitis for a diagnostic colonoscopy.  01/10/2019 9:15 AM  Rodell Perna  has presented today for colonoscopy, with the diagnosis of Abnormal CT scan.  The various methods of treatment have been discussed with the patient and family. After consideration of risks, benefits and other options for treatment, the patient has consented to  Procedure(s): COLONOSCOPY WITH PROPOFOL (N/A) as a surgical intervention.  The patient's history has been reviewed, patient examined, no change in status, stable for surgery.  I have reviewed the patient's chart and labs.  Questions were answered to the patient's satisfaction.     Ronnette Juniper

## 2019-01-10 NOTE — Brief Op Note (Addendum)
01/07/2019 - 01/10/2019  9:50 AM  PATIENT:  Paul [name]  53 y.o. male  PRE-OPERATIVE DIAGNOSIS:  Abnormal CT scan  POST-OPERATIVE DIAGNOSIS:  1 cecum polyp removed; 1 descending colon polyp removed.  PROCEDURE:  Procedure(s): COLONOSCOPY WITH PROPOFOL (N/A) POLYPECTOMY  SURGEON:  Surgeon(s) and Role:    Ronnette Juniper, MD - Primary  PHYSICIAN ASSISTANT:   ASSISTANTS:Hayleigh Westmoreland,RN, William Dalton, Tech  ANESTHESIA:   MAC  EBL:  0 mL   BLOOD ADMINISTERED:none  DRAINS: none   LOCAL MEDICATIONS USED:  NONE  SPECIMEN:  Biopsy / Limited Resection  DISPOSITION OF SPECIMEN:  PATHOLOGY  COUNTS:  YES  TOURNIQUET:  * No tourniquets in log *  DICTATION: .Dragon Dictation  PLAN OF CARE: Admit to inpatient   PATIENT DISPOSITION:  PACU - hemodynamically stable.   Delay start of Pharmacological VTE agent (>24hrs) due to surgical blood loss or risk of bleeding: no

## 2019-01-10 NOTE — Transfer of Care (Signed)
Immediate Anesthesia Transfer of Care Note  Patient: Paul [name]  Procedure(s) Performed: COLONOSCOPY WITH PROPOFOL (N/A ) POLYPECTOMY  Patient Location: Endoscopy Unit  Anesthesia Type:MAC  Level of Consciousness: awake, alert  and oriented  Airway & Oxygen Therapy: Patient Spontanous Breathing and Patient connected to nasal cannula oxygen  Post-op Assessment: Report given to RN and Post -op Vital signs reviewed and stable  Post vital signs: Reviewed and stable  Last Vitals:  Vitals Value Taken Time  BP    Temp    Pulse    Resp    SpO2      Last Pain:  Vitals:   01/10/19 0843  TempSrc: Temporal  PainSc: 3          Complications: No apparent anesthesia complications

## 2019-01-10 NOTE — Plan of Care (Signed)
  Problem: Education: Goal: Knowledge of General Education information will improve Description: Including pain rating scale, medication(s)/side effects and non-pharmacologic comfort measures Outcome: Progressing   Problem: Health Behavior/Discharge Planning: Goal: Ability to manage health-related needs will improve Outcome: Progressing   Problem: Activity: Goal: Risk for activity intolerance will decrease Outcome: Progressing   

## 2019-01-10 NOTE — Op Note (Signed)
[facility] Patient Name: Paul [name] Procedure Date : 01/10/2019 MRN: [id number] Attending MD: Ronnette Juniper , MD Date of Birth: Dec 24, 1965 CSN: [id number] Age: 53 Admit Type: Inpatient Procedure:                Colonoscopy Indications:              This is the patient's first colonoscopy, Abdominal                            pain in the left lower quadrant, Abnormal CT of the                            GI tract Providers:                Ronnette Juniper, MD, Jeanella Cara, RN,                            William Dalton, Technician Referring MD:              Medicines:                Monitored Anesthesia Care Complications:            No immediate complications. Estimated Blood Loss:     Estimated blood loss: none. Procedure:                Pre-Anesthesia Assessment:                           - Prior to the procedure, a History and Physical                            was performed, and patient medications and                            allergies were reviewed. The patient's tolerance of                            previous anesthesia was also reviewed. The risks                            and benefits of the procedure and the sedation                            options and risks were discussed with the patient.                            All questions were answered, and informed consent                            was obtained. Prior Anticoagulants: The patient has                            taken no previous anticoagulant or antiplatelet                            agents.  ASA Grade Assessment: II - A patient with                            mild systemic disease. After reviewing the risks                            and benefits, the patient was deemed in                            satisfactory condition to undergo the procedure.                           After obtaining informed consent, the colonoscope                            was passed under direct vision. Throughout  the                            procedure, the patient's blood pressure, pulse, and                            oxygen saturations were monitored continuously. The                            PCF-H190DL ([id number]) Olympus pediatric colonscope                            was introduced through the anus and advanced to the                            the terminal ileum. The colonoscopy was performed                            without difficulty. The patient tolerated the                            procedure well. The quality of the bowel                            preparation was good. Scope In: 9:31:50 AM Scope Out: 5:99:35 AM Scope Withdrawal Time: 0 hours 11 minutes 1 second  Total Procedure Duration: 0 hours 14 minutes 55 seconds  Findings:      The perianal and digital rectal examinations were normal.      The terminal ileum appeared normal.      A 4 mm polyp was found in the cecum. The polyp was sessile. The polyp       was removed with a piecemeal technique using a cold biopsy forceps.       Resection and retrieval were complete.      A 7 mm polyp was found in the descending colon. The polyp was sessile.       The polyp was removed with a hot snare. Resection and retrieval were       complete.      The exam was otherwise without abnormality on direct and  retroflexion       views. Impression:               - The examined portion of the ileum was normal.                           - One 4 mm polyp in the cecum, removed piecemeal                            using a cold biopsy forceps. Resected and retrieved.                           - One 7 mm polyp in the descending colon, removed                            with a hot snare. Resected and retrieved.                           - The examination was otherwise normal on direct                            and retroflexion views. Moderate Sedation:      Patient did not receive moderate sedation for this procedure, but       instead received  monitored anesthesia care. Recommendation:           - Low fat diet.                           - Continue present medications.                           - Await pathology results.                           - Repeat colonoscopy for surveillance based on                            pathology results. Procedure Code(s):        --- Professional ---                           [phone], Colonoscopy, flexible; with removal of                            tumor(s), polyp(s), or other lesion(s) by snare                            technique                           95284, 74, Colonoscopy, flexible; with biopsy,                            single or multiple Diagnosis Code(s):        --- Professional ---  K63.5, Polyp of colon                           R10.32, Left lower quadrant pain                           R93.3, Abnormal findings on diagnostic imaging of                            other parts of digestive tract CPT copyright 2019 American [employer] Association. All rights reserved. The codes documented in this report are preliminary and upon coder review may  be revised to meet current compliance requirements. Ronnette Juniper, MD 01/10/2019 9:53:47 AM This report has been signed electronically. Number of Addenda: 0

## 2019-01-11 DIAGNOSIS — K852 Alcohol induced acute pancreatitis without necrosis or infection: Secondary | ICD-10-CM

## 2019-01-11 DIAGNOSIS — E785 Hyperlipidemia, unspecified: Secondary | ICD-10-CM

## 2019-01-11 LAB — CBC
HCT: 30.6 % — ABNORMAL LOW (ref 39.0–52.0)
Hemoglobin: 10.1 g/dL — ABNORMAL LOW (ref 13.0–17.0)
MCH: 29.6 pg (ref 26.0–34.0)
MCHC: 33 g/dL (ref 30.0–36.0)
MCV: 89.7 fL (ref 80.0–100.0)
Platelets: 372 10*3/uL (ref 150–400)
RBC: 3.41 MIL/uL — ABNORMAL LOW (ref 4.22–5.81)
RDW: 15.6 % — ABNORMAL HIGH (ref 11.5–15.5)
WBC: 12 10*3/uL — ABNORMAL HIGH (ref 4.0–10.5)
nRBC: 0 % (ref 0.0–0.2)

## 2019-01-11 LAB — LIPASE, BLOOD: Lipase: 68 U/L — ABNORMAL HIGH (ref 11–51)

## 2019-01-11 LAB — TISSUE TRANSGLUTAMINASE, IGA: Tissue Transglutaminase Ab, IgA: <2 U/mL (ref 0–3)

## 2019-01-11 MED ORDER — FOLIC ACID 1 MG PO TABS: 1.0000 mg | ORAL_TABLET | Freq: Every day | ORAL | Status: DC

## 2019-01-11 MED ORDER — PANTOPRAZOLE SODIUM 40 MG PO TBEC: 40.0000 mg | DELAYED_RELEASE_TABLET | Freq: Every day | ORAL | 0 refills | Status: DC

## 2019-01-11 MED ORDER — SENNOSIDES-DOCUSATE SODIUM 8.6-50 MG PO TABS: 1.0000 | ORAL_TABLET | Freq: Every evening | ORAL | 0 refills | Status: DC | PRN

## 2019-01-11 MED ORDER — THIAMINE HCL 100 MG/ML IJ SOLN: 100.0000 mg | Freq: Every day | INTRAMUSCULAR | Status: DC

## 2019-01-11 MED ORDER — PANTOPRAZOLE SODIUM 40 MG PO TBEC: 40.0000 mg | DELAYED_RELEASE_TABLET | Freq: Every day | ORAL | 1 refills | Status: DC

## 2019-01-11 MED ORDER — POLYETHYLENE GLYCOL 3350 17 G PO PACK: 17.0000 g | PACK | Freq: Every day | ORAL | 0 refills | Status: DC | PRN

## 2019-01-11 MED ORDER — HYDROCODONE-ACETAMINOPHEN 5-325 MG PO TABS: 1.0000 | ORAL_TABLET | Freq: Four times a day (QID) | ORAL | 0 refills | Status: DC | PRN

## 2019-01-11 NOTE — Plan of Care (Signed)
  Problem: Activity: Goal: Risk for activity intolerance will decrease Outcome: Adequate for Discharge   Problem: Nutrition: Goal: Adequate nutrition will be maintained Outcome: Adequate for Discharge   Problem: Elimination: Goal: Will not experience complications related to urinary retention Outcome: Adequate for Discharge

## 2019-01-11 NOTE — Discharge Summary (Signed)
 Name: Paul [name] MRN: [id number] DOB: 12/02/1965 53 y.o. PCP: No primary care provider on file.  Date of Admission: 01/07/2019  5:05 AM Date of Discharge: 01/11/2019 Attending Physician: Vincent, Duncan, MD  Discharge Diagnosis: 1. Alcohol-induced acute pancreatitis 2. Hyperlipidemia 3. Colon polyps  Discharge Medications: Allergies as of 01/11/2019   No Known Allergies     Medication List    STOP taking these medications   folic acid 1 MG tablet Commonly known as: FOLVITE   oxyCODONE 5 MG immediate release tablet Commonly known as: Oxy IR/ROXICODONE   thiamine 100 MG tablet     TAKE these medications   amLODipine 10 MG tablet Commonly known as: NORVASC Take 1 tablet (10 mg total) by mouth daily for 30 days.   carvedilol 6.25 MG tablet Commonly known as: COREG Take 1 tablet (6.25 mg total) by mouth 2 (two) times daily with a meal for 30 days.   HYDROcodone-acetaminophen 5-325 MG tablet Commonly known as: NORCO/VICODIN Take 1 tablet by mouth every 6 (six) hours as needed for moderate pain.   multivitamin with minerals Tabs tablet Take 1 tablet by mouth daily.   ondansetron 4 MG tablet Commonly known as: ZOFRAN Take 1 tablet (4 mg total) by mouth every 6 (six) hours as needed for nausea.   pantoprazole 40 MG tablet Commonly known as: Protonix Take 1 tablet (40 mg total) by mouth daily.   polyethylene glycol 17 g packet Commonly known as: MIRALAX / GLYCOLAX Take 17 g by mouth daily as needed for mild constipation.   senna-docusate 8.6-50 MG tablet Commonly known as: Senokot-S Take 1 tablet by mouth at bedtime as needed for moderate constipation.       Disposition and follow-up:   [name] was discharged from [facility] in Stable condition.  At the hospital follow up visit please address:  1. Alcohol-induced acute pancreatitis: - Encourage cessation of any and all alcohol use - Monitor for development of chronic pancreatitis or  pancreatic insufficiency in light of recurrent pancreatitis  2. Hyperlipidemia - Noted to have elevated LDL at 139 with ASCVD risk score of 9% - Discharged with recommendation to f/u with PCP - Consider starting moderate intensity statin therapy  3. Colon polyps - 2 small <9mm polyps found on cecum and descending colon without high-grade dysplasia - Recommend repeat colonoscopy in 10 years unless unknown high risk features are found  3.  Labs / imaging needed at time of follow-up: N/A  4.  Pending labs/ test needing follow-up: N/A  Follow-up Appointments: Follow-up Information    [facility] Follow up.   Why: January 19, 2019 at 0850 am  Contact information: [address] [place] [address] [phone]          Hospital Course by problem list: 1. Alcohol-induced acute pancreatitis: [name] is a 53 y.o. M with PMH of HFpEF, CKD2, and [facility]itis[facility] w/ complaints of abdominal pain and nausea. He mentioned recent 1 recent alcoholic drink and he was noted to have elevated lipase at 1210 in the ED. He was made Npo with IV fluid resuscitation and pain control. CT abd/pelvis was subsequently performed showing evidence of colonic wall thickening with inflammatory changes near the splenic flexure. He was started on Augmentin with further work-up for colitis with Transglutaminase antibodies, ESR and CRP. He was also noted to have recurrent pancreatitis on chart review and his IgG 4 was checked. Although his ESR and CRP    Name: Paul [name] MRN: [id number] DOB: 12/02/1965 53 y.o. PCP: No primary care provider on file.  Date of Admission: 01/07/2019  5:05 AM Date of Discharge: 01/11/2019 Attending Physician: Vincent, Duncan, MD  Discharge Diagnosis: 1. Alcohol-induced acute pancreatitis 2. Hyperlipidemia 3. Colon polyps  Discharge Medications: Allergies as of 01/11/2019   No Known Allergies     Medication List    STOP taking these medications   folic acid 1 MG tablet Commonly known as: FOLVITE   oxyCODONE 5 MG immediate release tablet Commonly known as: Oxy IR/ROXICODONE   thiamine 100 MG tablet     TAKE these medications   amLODipine 10 MG tablet Commonly known as: NORVASC Take 1 tablet (10 mg total) by mouth daily for 30 days.   carvedilol 6.25 MG tablet Commonly known as: COREG Take 1 tablet (6.25 mg total) by mouth 2 (two) times daily with a meal for 30 days.   HYDROcodone-acetaminophen 5-325 MG tablet Commonly known as: NORCO/VICODIN Take 1 tablet by mouth every 6 (six) hours as needed for moderate pain.   multivitamin with minerals Tabs tablet Take 1 tablet by mouth daily.   ondansetron 4 MG tablet Commonly known as: ZOFRAN Take 1 tablet (4 mg total) by mouth every 6 (six) hours as needed for nausea.   pantoprazole 40 MG tablet Commonly known as: Protonix Take 1 tablet (40 mg total) by mouth daily.   polyethylene glycol 17 g packet Commonly known as: MIRALAX / GLYCOLAX Take 17 g by mouth daily as needed for mild constipation.   senna-docusate 8.6-50 MG tablet Commonly known as: Senokot-S Take 1 tablet by mouth at bedtime as needed for moderate constipation.       Disposition and follow-up:   [name] was discharged from [facility] in Stable condition.  At the hospital follow up visit please address:  1. Alcohol-induced acute pancreatitis: - Encourage cessation of any and all alcohol use - Monitor for development of chronic pancreatitis or  pancreatic insufficiency in light of recurrent pancreatitis  2. Hyperlipidemia - Noted to have elevated LDL at 139 with ASCVD risk score of 9% - Discharged with recommendation to f/u with PCP - Consider starting moderate intensity statin therapy  3. Colon polyps - 2 small <9mm polyps found on cecum and descending colon without high-grade dysplasia - Recommend repeat colonoscopy in 10 years unless unknown high risk features are found  3.  Labs / imaging needed at time of follow-up: N/A  4.  Pending labs/ test needing follow-up: N/A  Follow-up Appointments: Follow-up Information    [facility] Follow up.   Why: January 19, 2019 at 0850 am  Contact information: [address] [place] [address] [phone]          Hospital Course by problem list: 1. Alcohol-induced acute pancreatitis: [name] is a 53 y.o. M with PMH of HFpEF, CKD2, and recurrent [facility]/ [facility] of abdominal pain and nausea. He mentioned recent 1 recent alcoholic drink and he was noted to have elevated lipase at 1210 in the ED. He was made Npo with IV fluid resuscitation and pain control. CT abd/pelvis was subsequently performed showing evidence of colonic wall thickening with inflammatory changes near the splenic flexure. He was started on Augmentin with further work-up for colitis with Transglutaminase antibodies, ESR and CRP. He was also noted to have recurrent pancreatitis on chart review and his IgG 4 was checked. Although his ESR and CRP    Name: Paul [name] MRN: [id number] DOB: 12/02/1965 53 y.o. PCP: No primary care provider on file.  Date of Admission: 01/07/2019  5:05 AM Date of Discharge: 01/11/2019 Attending Physician: Vincent, Duncan, MD  Discharge Diagnosis: 1. Alcohol-induced acute pancreatitis 2. Hyperlipidemia 3. Colon polyps  Discharge Medications: Allergies as of 01/11/2019   No Known Allergies     Medication List    STOP taking these medications   folic acid 1 MG tablet Commonly known as: FOLVITE   oxyCODONE 5 MG immediate release tablet Commonly known as: Oxy IR/ROXICODONE   thiamine 100 MG tablet     TAKE these medications   amLODipine 10 MG tablet Commonly known as: NORVASC Take 1 tablet (10 mg total) by mouth daily for 30 days.   carvedilol 6.25 MG tablet Commonly known as: COREG Take 1 tablet (6.25 mg total) by mouth 2 (two) times daily with a meal for 30 days.   HYDROcodone-acetaminophen 5-325 MG tablet Commonly known as: NORCO/VICODIN Take 1 tablet by mouth every 6 (six) hours as needed for moderate pain.   multivitamin with minerals Tabs tablet Take 1 tablet by mouth daily.   ondansetron 4 MG tablet Commonly known as: ZOFRAN Take 1 tablet (4 mg total) by mouth every 6 (six) hours as needed for nausea.   pantoprazole 40 MG tablet Commonly known as: Protonix Take 1 tablet (40 mg total) by mouth daily.   polyethylene glycol 17 g packet Commonly known as: MIRALAX / GLYCOLAX Take 17 g by mouth daily as needed for mild constipation.   senna-docusate 8.6-50 MG tablet Commonly known as: Senokot-S Take 1 tablet by mouth at bedtime as needed for moderate constipation.       Disposition and follow-up:   [name] was discharged from [facility] in Stable condition.  At the hospital follow up visit please address:  1. Alcohol-induced acute pancreatitis: - Encourage cessation of any and all alcohol use - Monitor for development of chronic pancreatitis or  pancreatic insufficiency in light of recurrent pancreatitis  2. Hyperlipidemia - Noted to have elevated LDL at 139 with ASCVD risk score of 9% - Discharged with recommendation to f/u with PCP - Consider starting moderate intensity statin therapy  3. Colon polyps - 2 small <9mm polyps found on cecum and descending colon without high-grade dysplasia - Recommend repeat colonoscopy in 10 years unless unknown high risk features are found  3.  Labs / imaging needed at time of follow-up: N/A  4.  Pending labs/ test needing follow-up: N/A  Follow-up Appointments: Follow-up Information    [facility] Follow up.   Why: January 19, 2019 at 0850 am  Contact information: [address] [place] [address] [phone]          Hospital Course by problem list: 1. Alcohol-induced acute pancreatitis: [name] is a 53 y.o. M with PMH of HFpEF, CKD2, and [facility]/ [facility] of abdominal pain and nausea. He mentioned recent 1 recent alcoholic drink and he was noted to have elevated lipase at 1210 in the ED. He was made Npo with IV fluid resuscitation and pain control. CT abd/pelvis was subsequently performed showing evidence of colonic wall thickening with inflammatory changes near the splenic flexure. He was started on Augmentin with further work-up for colitis with Transglutaminase antibodies, ESR and CRP. He was also noted to have recurrent pancreatitis on chart review and his IgG 4 was checked. Although his ESR and CRP

## 2019-01-11 NOTE — Progress Notes (Addendum)
   Subjective:  Paul [name] is a 53 y.o. M with PMH of  HFpEF, CKD2, and recurrent pancreatitis admit for abdominal pain admit for pancreatitis on hospital day 4  [name] was examined and evaluated at bedside this AM. He reports of abdominal pain around 3AM which subsided with pain medication. Denies radiation and it is not similar to his previous pain episodes. He ate breakfast this morning with minimal pain. He has a history of hemorrhoids and is worried about being constipated. He is passing gas and had a BM last night. All his questions and concerns were appropriately addressed. Advised on importance of avoiding alcohol use in the future. Paul [name] expressed understanding.  Objective:  Vital signs in last 24 hours: Vitals:   01/10/19 1456 01/10/19 2132 01/11/19 0505 01/11/19 0511  BP: (!) 176/94 (!) 141/90 (!) 163/94 (!) 152/88  Pulse: 82 72 70   Resp: 19 18 17    Temp: 99 F (37.2 C) 98.5 F (36.9 C) 98.8 F (37.1 C)   TempSrc: Oral Oral    SpO2: 98% 97% 98%   Weight:      Height:       Gen: Well-developed, well nourished, NAD CV: RRR, S1, S2 normal, No rubs, no murmurs, no gallops Pulm: CTAB, No rales, no wheezes Abd: Soft, BS+, NTND, No rebound, no guarding Extm: ROM intact, Peripheral pulses intact, No peripheral edema Skin: Dry, Warm  Assessment/Plan:  Principal Problem:   Colitis Active Problems:   Essential hypertension   Pancreatitis   Loss of weight  Paul [name] is a 53 y.o. M with PMH of HFpEF, and recurrent pancreatitis admit for colitis. He was able to tolerate oral intake overnight and this morning without nausea, vomiting. He had an episode of abdominal pain this morning but currently symptom-free. Safe for discharge home today.  Abdominal pain 2/2 acute pancreatitis Endorsing no tenderness on exam. Initially though to be possible colitis but suspect inflammatory process from pancreatitis irritating outer colonic wall in light of no obvious evidence of  inflammation, necrosis or infection on colonoscopy yesterday. WBC 10.7->12.7->13.2->12.0 Baseline lipase today establish at 68 this am - Resolved, Discharge home today  DVT prophx: Lovenox Diet: Heart Healthy Code: Full  Dispo: Anticipated discharge in approximately today.   Mosetta Anis, MD 01/11/2019, 6:38 AM Pager: [phone]

## 2019-01-11 NOTE — Discharge Instructions (Signed)
Acute Pancreatitis ° °Acute pancreatitis happens when the pancreas gets swollen. The pancreas is a large gland behind the stomach. The pancreas helps control blood sugar. It also makes enzymes that help digest food. This condition happens when the enzymes attack the pancreas and damage it. Most attacks last a couple of days and are dangerous. The lungs, heart, and kidneys may stop working. °What are the causes? °· Alcohol abuse. °· Drug abuse. °· Gallstones. °· Some medicines. °· Some chemicals. °· Infection. °· Damage caused by an accident.. °· Belly (abdominal) surgery. °· In some cases, the cause is not known. °What are the signs or symptoms? °· Pain in the upper belly and back. °· Swelling of the belly °· Feeling sick to your stomach (nausea) and throwing up (vomiting). °How is this treated? °· You will probably have to stay in the hospital. °? Treatment may include: °§ Fluid through an IV. °§ A tube to remove stomach contents and stop you from throwing up. °§ Not eating for 3-4 days. °§ Pain medicine. °§ Antibiotic medicines if you have an infection. °§ Surgery on the pancreas or gallbladder. °Follow these instructions at home: °Eating and drinking ° °· Follow instructions from your doctor about diet. °· Eat small meals often. Avoid eating big meals. °· Eat foods that do not have a lot of fat in them. °· Drink enough fluid to keep your pee (urine) pale yellow. °· Do not drink alcohol if it caused your condition. °General instructions °· Take over-the-counter and prescription medicines only as told by your doctor. °· Do not use cigarettes, e-cigarettes, and chewing tobacco. If you need help quitting, ask your doctor. °· Get plenty of rest. °· If directed, check your blood sugar at home as told by your doctor. °· Keep all follow-up visits as told by your doctor. This is important. °Contact a doctor if: °· You do not get better as quickly as expected. °· You have new symptoms. °· Your symptoms get worse. °· You  have lasting pain or weakness. °· You continue to feel sick to your stomach. °· You get better and then you have another pain attack. °· You have a fever. °Get help right away if: °· You cannot eat or keep fluids down. °· Your pain becomes very bad. °· Your skin or the white part of your eyes turns yellow. °· You throw up. °· You feel dizzy or you pass out. °· Your blood sugar is high (over 300 mg/dL). °Summary °· Acute pancreatitis happens when the pancreas gets swollen. °· This condition is usually caused by alcohol abuse, drug abuse, or gallstones. °· You will probably have to stay in the hospital for treatment. °This information is not intended to replace advice given to you by your health care provider. Make sure you discuss any questions you have with your health care provider. °Document Released: 12/31/2007 Document Revised: 11/17/2016 Document Reviewed: 04/17/2015 °[employer] Interactive Patient Education © 2019 [employer] ° °

## 2019-01-11 NOTE — TOC Transition Note (Signed)
Transition of Care [facility]) - CM/SW Discharge Note   Patient Details  Name: Paul [name] MRN: [id number] Date of Birth: 07/04/1966  Transition of Care [facility] CM/SW Contact:  Marilu Favre, RN Phone Number: 01/11/2019, 12:07 PM   Clinical Narrative:     Patient used [employer] ( medication assistance ) 4/20 therefore not eligible for MATCH at present. Called [facility] Protonix prescription will cost $35.39 , patient aware and can afford.   Follow up appointment scheduled.   Final next level of care: Home/Self Care Barriers to Discharge: Continued Medical Work up   Patient Goals and CMS Choice Patient states their goals for this hospitalization and ongoing recovery are:: to go home CMS Medicare.gov Compare Post Acute Care list provided to:: Patient Choice offered to / list presented to : NA  Discharge Placement                       Discharge Plan and Services In-house Referral: PCP / [facility], Financial Counselor Discharge Planning Services: CM Consult, Medication Assistance, [place] [facility], Follow-up appt scheduled            DME Arranged: N/A         HH Arranged: NA          Social Determinants of Health (SDOH) Interventions     Readmission Risk Interventions Readmission Risk Prevention Plan 11/10/2018  PCP or Specialist Appt within 5-7 Days Not Complete  Not Complete comments [facility] (this was the first availabale appt they had)  Some recent data might be hidden

## 2019-01-17 ENCOUNTER — Encounter (HOSPITAL_COMMUNITY): Payer: Self-pay

## 2019-01-17 ENCOUNTER — Other Ambulatory Visit: Payer: Self-pay

## 2019-01-17 ENCOUNTER — Emergency Department (HOSPITAL_COMMUNITY): Payer: Self-pay

## 2019-01-17 ENCOUNTER — Inpatient Hospital Stay (HOSPITAL_COMMUNITY)
Admission: EM | Admit: 2019-01-17 | Discharge: 2019-01-20 | DRG: 065 | Disposition: A | Discharge status: Home / Self Care | Payer: Self-pay | Attending: Student in an Organized Health Care Education/Training Program | Admitting: Student in an Organized Health Care Education/Training Program

## 2019-01-17 DIAGNOSIS — I639 Cerebral infarction, unspecified: Secondary | ICD-10-CM | POA: Diagnosis present

## 2019-01-17 DIAGNOSIS — G8191 Hemiplegia, unspecified affecting right dominant side: Secondary | ICD-10-CM | POA: Diagnosis present

## 2019-01-17 DIAGNOSIS — K5790 Diverticulosis of intestine, part unspecified, without perforation or abscess without bleeding: Secondary | ICD-10-CM | POA: Diagnosis present

## 2019-01-17 DIAGNOSIS — Z823 Family history of stroke: Secondary | ICD-10-CM

## 2019-01-17 DIAGNOSIS — Z803 Family history of malignant neoplasm of breast: Secondary | ICD-10-CM

## 2019-01-17 DIAGNOSIS — R29702 NIHSS score 2: Secondary | ICD-10-CM | POA: Diagnosis present

## 2019-01-17 DIAGNOSIS — F1010 Alcohol abuse, uncomplicated: Secondary | ICD-10-CM | POA: Diagnosis present

## 2019-01-17 DIAGNOSIS — K861 Other chronic pancreatitis: Secondary | ICD-10-CM | POA: Diagnosis present

## 2019-01-17 DIAGNOSIS — Z66 Do not resuscitate: Secondary | ICD-10-CM | POA: Diagnosis present

## 2019-01-17 DIAGNOSIS — N182 Chronic kidney disease, stage 2 (mild): Secondary | ICD-10-CM | POA: Diagnosis present

## 2019-01-17 DIAGNOSIS — I700 Atherosclerosis of aorta: Secondary | ICD-10-CM | POA: Diagnosis present

## 2019-01-17 DIAGNOSIS — D75839 Thrombocytosis, unspecified: Secondary | ICD-10-CM | POA: Diagnosis present

## 2019-01-17 DIAGNOSIS — D473 Essential (hemorrhagic) thrombocythemia: Secondary | ICD-10-CM | POA: Diagnosis present

## 2019-01-17 DIAGNOSIS — K219 Gastro-esophageal reflux disease without esophagitis: Secondary | ICD-10-CM | POA: Diagnosis present

## 2019-01-17 DIAGNOSIS — I130 Hypertensive heart and chronic kidney disease with heart failure and stage 1 through stage 4 chronic kidney disease, or unspecified chronic kidney disease: Secondary | ICD-10-CM | POA: Diagnosis present

## 2019-01-17 DIAGNOSIS — I10 Essential (primary) hypertension: Secondary | ICD-10-CM | POA: Diagnosis present

## 2019-01-17 DIAGNOSIS — F17210 Nicotine dependence, cigarettes, uncomplicated: Secondary | ICD-10-CM | POA: Diagnosis present

## 2019-01-17 DIAGNOSIS — Z720 Tobacco use: Secondary | ICD-10-CM | POA: Diagnosis present

## 2019-01-17 DIAGNOSIS — E785 Hyperlipidemia, unspecified: Secondary | ICD-10-CM | POA: Diagnosis present

## 2019-01-17 DIAGNOSIS — Z20828 Contact with and (suspected) exposure to other viral communicable diseases: Secondary | ICD-10-CM | POA: Diagnosis present

## 2019-01-17 DIAGNOSIS — Z833 Family history of diabetes mellitus: Secondary | ICD-10-CM

## 2019-01-17 DIAGNOSIS — Z801 Family history of malignant neoplasm of trachea, bronchus and lung: Secondary | ICD-10-CM

## 2019-01-17 DIAGNOSIS — I5032 Chronic diastolic (congestive) heart failure: Secondary | ICD-10-CM | POA: Diagnosis present

## 2019-01-17 DIAGNOSIS — I6340 Cerebral infarction due to embolism of unspecified cerebral artery: Principal | ICD-10-CM | POA: Diagnosis present

## 2019-01-17 LAB — DIFFERENTIAL
Abs Immature Granulocytes: 0.07 10*3/uL (ref 0.00–0.07)
Basophils Absolute: 0.1 10*3/uL (ref 0.0–0.1)
Basophils Relative: 1 %
Eosinophils Absolute: 0.5 10*3/uL (ref 0.0–0.5)
Eosinophils Relative: 5 %
Immature Granulocytes: 1 %
Lymphocytes Relative: 23 %
Lymphs Abs: 2.4 10*3/uL (ref 0.7–4.0)
Monocytes Absolute: 1.1 10*3/uL — ABNORMAL HIGH (ref 0.1–1.0)
Monocytes Relative: 10 %
Neutro Abs: 6.3 10*3/uL (ref 1.7–7.7)
Neutrophils Relative %: 60 %

## 2019-01-17 LAB — I-STAT CHEM 8, ED
BUN: 13 mg/dL (ref 6–20)
Calcium, Ion: 1.13 mmol/L — ABNORMAL LOW (ref 1.15–1.40)
Chloride: 98 mmol/L (ref 98–111)
Creatinine, Ser: 0.9 mg/dL (ref 0.61–1.24)
Glucose, Bld: 121 mg/dL — ABNORMAL HIGH (ref 70–99)
HCT: 32 % — ABNORMAL LOW (ref 39.0–52.0)
Hemoglobin: 10.9 g/dL — ABNORMAL LOW (ref 13.0–17.0)
Potassium: 3.7 mmol/L (ref 3.5–5.1)
Sodium: 138 mmol/L (ref 135–145)
TCO2: 33 mmol/L — ABNORMAL HIGH (ref 22–32)

## 2019-01-17 LAB — CBC
HCT: 32 % — ABNORMAL LOW (ref 39.0–52.0)
Hemoglobin: 10.2 g/dL — ABNORMAL LOW (ref 13.0–17.0)
MCH: 29.3 pg (ref 26.0–34.0)
MCHC: 31.9 g/dL (ref 30.0–36.0)
MCV: 92 fL (ref 80.0–100.0)
Platelets: 730 10*3/uL — ABNORMAL HIGH (ref 150–400)
RBC: 3.48 MIL/uL — ABNORMAL LOW (ref 4.22–5.81)
RDW: 15.7 % — ABNORMAL HIGH (ref 11.5–15.5)
WBC: 10.4 10*3/uL (ref 4.0–10.5)
nRBC: 0 % (ref 0.0–0.2)

## 2019-01-17 LAB — COMPREHENSIVE METABOLIC PANEL
ALT: 50 U/L — ABNORMAL HIGH (ref 0–44)
AST: 37 U/L (ref 15–41)
Albumin: 2.9 g/dL — ABNORMAL LOW (ref 3.5–5.0)
Alkaline Phosphatase: 84 U/L (ref 38–126)
Anion gap: 11 (ref 5–15)
BUN: 13 mg/dL (ref 6–20)
CO2: 28 mmol/L (ref 22–32)
Calcium: 9.1 mg/dL (ref 8.9–10.3)
Chloride: 99 mmol/L (ref 98–111)
Creatinine, Ser: 1.03 mg/dL (ref 0.61–1.24)
GFR calc Af Amer: >60 mL/min (ref >60)
GFR calc non Af Amer: >60 mL/min (ref >60)
Glucose, Bld: 124 mg/dL — ABNORMAL HIGH (ref 70–99)
Potassium: 3.5 mmol/L (ref 3.5–5.1)
Sodium: 138 mmol/L (ref 135–145)
Total Bilirubin: 0.3 mg/dL (ref 0.3–1.2)
Total Protein: 6.9 g/dL (ref 6.5–8.1)

## 2019-01-17 LAB — PROTIME-INR
INR: 1.1 (ref 0.8–1.2)
Prothrombin Time: 14.5 seconds (ref 11.4–15.2)

## 2019-01-17 LAB — APTT: aPTT: 35 seconds (ref 24–36)

## 2019-01-17 MED ORDER — SODIUM CHLORIDE 0.9% FLUSH: 3.0000 mL | Freq: Once | INTRAVENOUS | Status: DC
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
## 2019-01-17 NOTE — ED Notes (Signed)
Pt reports R sided weakness/tingling present "for some time" but noticed it more Saturday. Pt states he is concerned he has had a stroke. Has hx of Bell's Palsy so facial droop noted to L sided.

## 2019-01-17 NOTE — ED Triage Notes (Signed)
Pt c/o of R sided weakness x 1 day, particularly in R arm. Pt endorses tingling on R side. Pt AOx4 with no other deficits noted on triage assessment.

## 2019-01-18 ENCOUNTER — Emergency Department (HOSPITAL_COMMUNITY): Payer: Self-pay

## 2019-01-18 ENCOUNTER — Inpatient Hospital Stay (HOSPITAL_COMMUNITY): Payer: Self-pay

## 2019-01-18 DIAGNOSIS — I639 Cerebral infarction, unspecified: Secondary | ICD-10-CM | POA: Diagnosis present

## 2019-01-18 DIAGNOSIS — I6340 Cerebral infarction due to embolism of unspecified cerebral artery: Principal | ICD-10-CM

## 2019-01-18 DIAGNOSIS — Z79899 Other long term (current) drug therapy: Secondary | ICD-10-CM

## 2019-01-18 DIAGNOSIS — I2510 Atherosclerotic heart disease of native coronary artery without angina pectoris: Secondary | ICD-10-CM

## 2019-01-18 DIAGNOSIS — N182 Chronic kidney disease, stage 2 (mild): Secondary | ICD-10-CM

## 2019-01-18 DIAGNOSIS — Z8669 Personal history of other diseases of the nervous system and sense organs: Secondary | ICD-10-CM

## 2019-01-18 DIAGNOSIS — Z66 Do not resuscitate: Secondary | ICD-10-CM

## 2019-01-18 DIAGNOSIS — D649 Anemia, unspecified: Secondary | ICD-10-CM

## 2019-01-18 DIAGNOSIS — D75839 Thrombocytosis, unspecified: Secondary | ICD-10-CM | POA: Diagnosis present

## 2019-01-18 DIAGNOSIS — I130 Hypertensive heart and chronic kidney disease with heart failure and stage 1 through stage 4 chronic kidney disease, or unspecified chronic kidney disease: Secondary | ICD-10-CM

## 2019-01-18 DIAGNOSIS — I5030 Unspecified diastolic (congestive) heart failure: Secondary | ICD-10-CM

## 2019-01-18 DIAGNOSIS — Z7289 Other problems related to lifestyle: Secondary | ICD-10-CM

## 2019-01-18 DIAGNOSIS — Z79891 Long term (current) use of opiate analgesic: Secondary | ICD-10-CM

## 2019-01-18 DIAGNOSIS — D473 Essential (hemorrhagic) thrombocythemia: Secondary | ICD-10-CM

## 2019-01-18 DIAGNOSIS — R1032 Left lower quadrant pain: Secondary | ICD-10-CM

## 2019-01-18 DIAGNOSIS — G8929 Other chronic pain: Secondary | ICD-10-CM

## 2019-01-18 DIAGNOSIS — K219 Gastro-esophageal reflux disease without esophagitis: Secondary | ICD-10-CM

## 2019-01-18 DIAGNOSIS — R29810 Facial weakness: Secondary | ICD-10-CM

## 2019-01-18 DIAGNOSIS — F17210 Nicotine dependence, cigarettes, uncomplicated: Secondary | ICD-10-CM

## 2019-01-18 LAB — LIPID PANEL
Cholesterol: 133 mg/dL (ref 0–200)
HDL: 26 mg/dL — ABNORMAL LOW (ref >40)
LDL Cholesterol: 85 mg/dL (ref 0–99)
Total CHOL/HDL Ratio: 5.1 RATIO
Triglycerides: 108 mg/dL (ref <150)
VLDL: 22 mg/dL (ref 0–40)

## 2019-01-18 LAB — IRON AND TIBC
Iron: 36 ug/dL — ABNORMAL LOW (ref 45–182)
Saturation Ratios: 17 % — ABNORMAL LOW (ref 17.9–39.5)
TIBC: 217 ug/dL — ABNORMAL LOW (ref 250–450)
UIBC: 181 ug/dL

## 2019-01-18 LAB — PROTIME-INR
INR: 1.1 (ref 0.8–1.2)
Prothrombin Time: 14.5 seconds (ref 11.4–15.2)

## 2019-01-18 LAB — ECHOCARDIOGRAM COMPLETE
Height: 72 in
Weight: 2720 oz

## 2019-01-18 LAB — ANTITHROMBIN III: AntiThromb III Func: 95 % (ref 75–120)

## 2019-01-18 LAB — HEMOGLOBIN A1C
Hgb A1c MFr Bld: 5.7 % — ABNORMAL HIGH (ref 4.8–5.6)
Mean Plasma Glucose: 116.89 mg/dL

## 2019-01-18 LAB — SARS CORONAVIRUS 2 BY RT PCR (HOSPITAL ORDER, PERFORMED IN ~~LOC~~ HOSPITAL LAB): SARS Coronavirus 2: NEGATIVE

## 2019-01-18 MED ORDER — STROKE: EARLY STAGES OF RECOVERY BOOK
Freq: Once | Status: AC
  Administered 2019-01-18: 05:00:00
  Filled 2019-01-18: qty 1

## 2019-01-18 MED ORDER — ACETAMINOPHEN 160 MG/5ML PO SOLN: 650.0000 mg | ORAL | Status: DC | PRN

## 2019-01-18 MED ORDER — PANTOPRAZOLE SODIUM 40 MG PO TBEC
40.0000 mg | DELAYED_RELEASE_TABLET | Freq: Every day | ORAL | Status: DC
  Administered 2019-01-18 – 2019-01-20 (×3): 40 mg via ORAL
  Filled 2019-01-18 (×3): qty 1

## 2019-01-18 MED ORDER — ASPIRIN EC 81 MG PO TBEC
81.0000 mg | DELAYED_RELEASE_TABLET | Freq: Every day | ORAL | Status: DC
  Administered 2019-01-19 – 2019-01-20 (×2): 81 mg via ORAL
  Filled 2019-01-18 (×2): qty 1

## 2019-01-18 MED ORDER — ROSUVASTATIN CALCIUM 20 MG PO TABS
20.0000 mg | ORAL_TABLET | Freq: Every day | ORAL | Status: DC
  Administered 2019-01-18 – 2019-01-19 (×3): 20 mg via ORAL
  Filled 2019-01-18 (×5): qty 1

## 2019-01-18 MED ORDER — ASPIRIN 300 MG RE SUPP: 300.0000 mg | Freq: Every day | RECTAL | Status: DC

## 2019-01-18 MED ORDER — SENNOSIDES-DOCUSATE SODIUM 8.6-50 MG PO TABS: 1.0000 | ORAL_TABLET | Freq: Every evening | ORAL | Status: DC | PRN

## 2019-01-18 MED ORDER — CLOPIDOGREL BISULFATE 75 MG PO TABS
75.0000 mg | ORAL_TABLET | Freq: Every day | ORAL | Status: DC
  Administered 2019-01-18 – 2019-01-20 (×3): 75 mg via ORAL
  Filled 2019-01-18 (×3): qty 1

## 2019-01-18 MED ORDER — ACETAMINOPHEN 325 MG PO TABS: 650.0000 mg | ORAL_TABLET | ORAL | Status: DC | PRN

## 2019-01-18 MED ORDER — ENOXAPARIN SODIUM 40 MG/0.4ML ~~LOC~~ SOLN
40.0000 mg | Freq: Every day | SUBCUTANEOUS | Status: DC
  Administered 2019-01-18 – 2019-01-20 (×3): 40 mg via SUBCUTANEOUS
  Filled 2019-01-18 (×4): qty 0.4

## 2019-01-18 MED ORDER — ASPIRIN 325 MG PO TABS
325.0000 mg | ORAL_TABLET | Freq: Every day | ORAL | Status: DC
  Administered 2019-01-18: 325 mg via ORAL
  Filled 2019-01-18: qty 1

## 2019-01-18 MED ORDER — ACETAMINOPHEN 650 MG RE SUPP: 650.0000 mg | RECTAL | Status: DC | PRN

## 2019-01-18 NOTE — Progress Notes (Signed)
° °  Subjective:  Paul [name] is a 53 y.o. M with 53 y.o.Mwith PMH ofHFpEF, CKD2, and recurrent pancreatitispresenting with arm weakness on hospital day 0  Paul [name] was examined at bedside this morning and reports that he began experiencing numbness on Saturday. He also experienced tingling. He drive to [place] to see family. While he was there, he began experiencing these symptoms. He denies being involved in any strenuous activity such as weight lifting. Still endorses mild abdominal pain. It has been stable since discharge. Denies nausea, vomiting. He denies history of venous clots. He is a chronic smoker, about 1 pack a day. Denies cardiac history, palpitation. He mentions that his grandmother had a stroke at age 1  Objective:  Vital signs in last 24 hours: Vitals:   01/18/19 0315 01/18/19 0330 01/18/19 0345 01/18/19 0400  BP: (!) 165/101   (!) 159/99  Pulse:  68 67 66  Resp: (!) 22 17 20 17   Temp:      TempSrc:      SpO2:  96% 96% 95%  Weight:      Height:       Gen: Well-developed, well nourished, NAD CV: Regular rate and rhythm, no murmurs Abd: Soft, BS+, LUQ tenderness to deep palpation Extm: ROM intact, Peripheral pulses intact, No peripheral edema Skin: Dry, Warm, normal turgor Neuro: AAOx3, Strength 4/5 RUE, 5/5 LUE, no pronator drift  Assessment/Plan:  Active Problems:   Acute CVA (cerebrovascular accident) ([place])  Paul [name] is a 53 y.o.Mwith PMH ofHFpEF, CKD2, and recurrent pancreatitisadmit for multifocal CVA. Multiple foci in both hemispheres is concerning for embolic stroke. Unclear where it may have originated. No arrhythmia noted on prior hospitalization or this admission. Had colonoscopy on recent hospitalization but no orthopedic surgeries or cardiac procedures. He mentions that he had a prolonged drive to [place] and has known smoking history. Possibly had developed a venous clot that traveled via PFO although none noted on prior TTE on  04/2018. May have had transient hypercoagulable state during acute pancreatitis due to inflammation. He will require further inpatient admission to evaluate for possible sources.  Acute multifocal CVA CT head: old lacunar infarct in right external capsule. MRI Brain: Bilateral distribution in multiple small foci of acute ischemia within both cerebral hemispheres. MRI C-spine: Moderate left neural foraminal stenosis at C5-6 and C6-7. Lipid panel: T-cholesterol 133, HDL 26, LDL 85 Intermediate Risk 10-year ASCVD risk. Hgb a1c 5.7. RUE weakness on exam. - Appreciate neurology recs: Carotid dopplers, transcranial doppler, Echo - F/u above - Allow permissive hypertension - C/w aspirin, rosuvastatin 20mg ,  - PT/OT - Passed bedside swallow can start diet - Telemetry - Hypercoagulable Panel (Antithrombin, Protein C, Protein S, Homocysteine, Factor 5 leiden, Lupus anticoagulant, PT gene)  Reactive Thrombocytosis On admission noted to have platelet count of 730. 375 on prior admission. Hemoglobin 10.2. Likely due to anemia vs inflammatory state from pancreatitis. Also has chronic iron deficiency anemia. Iron 36, Sat ratio 17. Low TIBC at 217. - Trend CBC  Chronic Abdominal Pain Hx of recurrent pancreatitis w/ admission on 6/10-6/15 for pancreatitis. Endorsing pain daily now. Tender to palpation on exam. Most likely transitioned to chronic pancreatitis. Baseline lipase 68. - C/w home med: pantoprazole 40mg  daily  DVT prophx: Lovenox Diet: Cardiac Bowel: Senokot Code: DNR  Dispo: Anticipated discharge in approximately 2-3 day(s).   Mosetta Anis, MD 01/18/2019, 6:40 AM Pager: [phone]

## 2019-01-18 NOTE — Progress Notes (Signed)
    [facility] has been requested to perform a transesophageal echocardiogram on Paul [name] for CVA.  After careful review of history and examination, the risks and benefits of transesophageal echocardiogram have been explained including risks of esophageal damage, perforation (1:10,000 risk), bleeding, pharyngeal hematoma as well as other potential complications associated with conscious sedation including aspiration, arrhythmia, respiratory failure and death. Alternatives to treatment were discussed, questions were answered. Patient is willing to proceed.  TEE - Dr. Harrell Gave @ 1515 .  NPO after midnight. Meds with sips.   Leanor Kail, PA-C 01/18/2019 4:43 PM

## 2019-01-18 NOTE — Progress Notes (Addendum)
Carotid artery duplex and TCD completed. Refer to "CV Proc" under chart review to view preliminary results.  01/18/2019 9:49 AM Maudry Mayhew, MHA, RVT, RDCS, RDMS
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
## 2019-01-18 NOTE — ED Notes (Signed)
Admitting provider at bedside.

## 2019-01-18 NOTE — Progress Notes (Signed)
STROKE TEAM PROGRESS NOTE  HPI: ( Dr Leonel Ramsay )Paul [name] is a 53 y.o. male with a history of hypertension, spinal stenosis who presents with right-sided weakness which has been present for couple of days.  He states that it was relatively sudden onset and has been persistent since that time.  He has not noticed any numbness or change in sensation.  It is not been really weak, just difficult to control.Of note, he was just recently admitted with alcohol induced pancreatitis. He has a history of Bell's palsy affecting the left side of his face with persistent left-sided weakness. LKW: Saturday 01/15/2019 tpa given?: no, out of window  INTERVAL HISTORY I have personally reviewed HPI with patient in details , electronic medical records and imaging films in PACS  Vitals:   01/18/19 0648 01/18/19 0649 01/18/19 0800 01/18/19 1105  BP: (!) 154/100  (!) 158/95 (!) 169/97  Pulse: 70  (!) 59   Resp:  (!) 22 (!) 21   Temp:      TempSrc:      SpO2: 96%  96% 97%  Weight:      Height:        CBC:  Recent Labs  Lab 01/17/19 1654 01/17/19 1719  WBC 10.4  --   NEUTROABS 6.3  --   HGB 10.2* 10.9*  HCT 32.0* 32.0*  MCV 92.0  --   PLT 730*  --     Basic Metabolic Panel:  Recent Labs  Lab 01/17/19 1654 01/17/19 1719  NA 138 138  K 3.5 3.7  CL 99 98  CO2 28  --   GLUCOSE 124* 121*  BUN 13 13  CREATININE 1.03 0.90  CALCIUM 9.1  --    Lipid Panel:     Component Value Date/Time   CHOL 133 01/18/2019 0415   TRIG 108 01/18/2019 0415   HDL 26 (L) 01/18/2019 0415   CHOLHDL 5.1 01/18/2019 0415   VLDL 22 01/18/2019 0415   LDLCALC 85 01/18/2019 0415   HgbA1c:  Lab Results  Component Value Date   HGBA1C 5.7 (H) 01/18/2019   Urine Drug Screen: No results found for: LABOPIA, COCAINSCRNUR, LABBENZ, AMPHETMU, THCU, LABBARB  Alcohol Level     Component Value Date/Time   ETH <10 01/07/2019 0543    IMAGING Ct Head Wo Contrast  Result Date: 01/17/2019 CLINICAL DATA:  53 year old  male with history of right-sided weakness for 1 day. Tingling on the right side. EXAM: CT HEAD WITHOUT CONTRAST TECHNIQUE: Contiguous axial images were obtained from the base of the skull through the vertex without intravenous contrast. COMPARISON:  Head CT 03/12/2012. FINDINGS: Brain: Well-defined focus of low attenuation in the right external capsule (axial image 17 of series 3), new compared to the prior study, but most compatible with an old lacunar infarct. No evidence of acute infarction, hemorrhage, hydrocephalus, extra-axial collection or mass lesion/mass effect. Vascular: No hyperdense vessel or unexpected calcification. Skull: Normal. Negative for fracture or focal lesion. Sinuses/Orbits: Small mucosal retention cyst or polyp in the posterior aspect of the right maxillary sinus. No acute findings. Other: None. IMPRESSION: 1. No acute intracranial abnormalities. 2. Old lacunar infarct in the right external capsule. 3. Small mucosal retention cyst or polyp in the posterior aspect of the right maxillary sinus incidentally noted. Electronically Signed   By: Vinnie Langton M.D.   On: 01/17/2019 18:02   Mr Brain Wo Contrast  Result Date: 01/18/2019 CLINICAL DATA:  Right-sided weakness and tingling. EXAM: MRI HEAD WITHOUT CONTRAST MRI CERVICAL  SPINE WITHOUT CONTRAST TECHNIQUE: Multiplanar, multiecho pulse sequences of the brain and surrounding structures, and cervical spine, to include the craniocervical junction and cervicothoracic junction, were obtained without intravenous contrast. COMPARISON:  Head CT 01/17/2019 FINDINGS: MRI HEAD FINDINGS BRAIN: There are scattered foci of acute ischemia within both hemispheres. The largest area is along the medial aspect of the left precentral gyrus. There are small areas in the left parietal and occipital lobes and in the right occipital lobe. The midline structures are normal. Multifocal white matter hyperintensity, most commonly due to chronic ischemic  microangiopathy. The cerebral and cerebellar volume are age-appropriate. No hydrocephalus. There are 3-5 foci of chronic microhemorrhage, predominantly central. No midline shift or other mass effect. VASCULAR: The major intracranial arterial and venous sinus flow voids are normal. SKULL AND UPPER CERVICAL SPINE: Calvarial bone marrow signal is normal. There is no skull base mass. Visualized upper cervical spine and soft tissues are normal. SINUSES/ORBITS: No fluid levels or advanced mucosal thickening. No mastoid or middle ear effusion. The orbits are normal. MRI CERVICAL SPINE FINDINGS Alignment: Physiologic. Vertebrae: No fracture, evidence of discitis, or bone lesion. Cord: Normal signal and morphology. Posterior Fossa, vertebral arteries, paraspinal tissues: Negative. Disc levels: C2-3: No disc herniation or stenosis. C3-4: Small central disc protrusion without spinal canal or neural foraminal stenosis. C4-5: No disc herniation or stenosis. C5-6: Small disc osteophyte complex with mild right and moderate left foraminal narrowing. No central spinal canal stenosis. C6-7: Left uncovertebral disc osteophyte complex with moderate left foraminal stenosis. No spinal canal stenosis. C7-T1: No disc herniation or stenosis. Sagittal imaging of the T1-T3 levels is normal. At T3-4, there is a small central disc protrusion without spinal canal stenosis. IMPRESSION: 1. Multiple small foci of acute ischemia within both cerebral hemispheres, the greatest along the medial aspect of the left precentral gyrus. This location is in keeping with reported right-sided symptoms. 2. The distribution within multiple vascular territories may indicate a central cardiac or aortic embolic process. 3. No cervical spinal cord abnormality. 4. Moderate left neural foraminal stenosis at C5-6 and C6-7. Electronically Signed   By: Ulyses Jarred M.D.   On: 01/18/2019 01:16   Mr Cervical Spine Wo Contrast  Result Date: 01/18/2019 CLINICAL DATA:   Right-sided weakness and tingling. EXAM: MRI HEAD WITHOUT CONTRAST MRI CERVICAL SPINE WITHOUT CONTRAST TECHNIQUE: Multiplanar, multiecho pulse sequences of the brain and surrounding structures, and cervical spine, to include the craniocervical junction and cervicothoracic junction, were obtained without intravenous contrast. COMPARISON:  Head CT 01/17/2019 FINDINGS: MRI HEAD FINDINGS BRAIN: There are scattered foci of acute ischemia within both hemispheres. The largest area is along the medial aspect of the left precentral gyrus. There are small areas in the left parietal and occipital lobes and in the right occipital lobe. The midline structures are normal. Multifocal white matter hyperintensity, most commonly due to chronic ischemic microangiopathy. The cerebral and cerebellar volume are age-appropriate. No hydrocephalus. There are 3-5 foci of chronic microhemorrhage, predominantly central. No midline shift or other mass effect. VASCULAR: The major intracranial arterial and venous sinus flow voids are normal. SKULL AND UPPER CERVICAL SPINE: Calvarial bone marrow signal is normal. There is no skull base mass. Visualized upper cervical spine and soft tissues are normal. SINUSES/ORBITS: No fluid levels or advanced mucosal thickening. No mastoid or middle ear effusion. The orbits are normal. MRI CERVICAL SPINE FINDINGS Alignment: Physiologic. Vertebrae: No fracture, evidence of discitis, or bone lesion. Cord: Normal signal and morphology. Posterior Fossa, vertebral arteries, paraspinal tissues: Negative. Disc  levels: C2-3: No disc herniation or stenosis. C3-4: Small central disc protrusion without spinal canal or neural foraminal stenosis. C4-5: No disc herniation or stenosis. C5-6: Small disc osteophyte complex with mild right and moderate left foraminal narrowing. No central spinal canal stenosis. C6-7: Left uncovertebral disc osteophyte complex with moderate left foraminal stenosis. No spinal canal stenosis. C7-T1:  No disc herniation or stenosis. Sagittal imaging of the T1-T3 levels is normal. At T3-4, there is a small central disc protrusion without spinal canal stenosis. IMPRESSION: 1. Multiple small foci of acute ischemia within both cerebral hemispheres, the greatest along the medial aspect of the left precentral gyrus. This location is in keeping with reported right-sided symptoms. 2. The distribution within multiple vascular territories may indicate a central cardiac or aortic embolic process. 3. No cervical spinal cord abnormality. 4. Moderate left neural foraminal stenosis at C5-6 and C6-7. Electronically Signed   By: Ulyses Jarred M.D.   On: 01/18/2019 01:16   Vas US Carotid (at [place] Only)  Result Date: 01/18/2019 Carotid Arterial Duplex Study Indications:  CVA. Risk Factors: Hypertension. Performing Technologist: Maudry Mayhew MHA, RDMS, RVT, RDCS  Examination Guidelines: A complete evaluation includes B-mode imaging, spectral Doppler, color Doppler, and power Doppler as needed of all accessible portions of each vessel. Bilateral testing is considered an integral part of a complete examination. Limited examinations for reoccurring indications may be performed as noted.  Right Carotid Findings: +----------+--------+--------+--------+--------------------------+--------+           PSV cm/sEDV cm/sStenosisDescribe                  Comments +----------+--------+--------+--------+--------------------------+--------+ CCA Prox  86      17                                                 +----------+--------+--------+--------+--------------------------+--------+ CCA Distal55      16              smooth and heterogenous            +----------+--------+--------+--------+--------------------------+--------+ ICA Prox  68      21              irregular and heterogenous         +----------+--------+--------+--------+--------------------------+--------+ ICA Distal71      28                                                  +----------+--------+--------+--------+--------------------------+--------+ ECA       89      15                                                 +----------+--------+--------+--------+--------------------------+--------+ +----------+--------+-------+----------------+-------------------+           PSV cm/sEDV cmsDescribe        Arm Pressure (mmHG) +----------+--------+-------+----------------+-------------------+ [id number]             Multiphasic, WNL                    +----------+--------+-------+----------------+-------------------+ +---------+--------+--+--------+--+---------+ VertebralPSV cm/s54EDV cm/s17Antegrade +---------+--------+--+--------+--+---------+  Left Carotid Findings: +----------+--------+--------+--------+---------------------+------------------+  PSV cm/sEDV cm/sStenosisDescribe             Comments           +----------+--------+--------+--------+---------------------+------------------+ CCA Prox  89      17                                   intimal thickening +----------+--------+--------+--------+---------------------+------------------+ CCA Distal68      14                                   intimal thickening +----------+--------+--------+--------+---------------------+------------------+ ICA Prox  84      26              smooth, heterogenous                                                      and calcific                            +----------+--------+--------+--------+---------------------+------------------+ ICA Distal71      29                                                      +----------+--------+--------+--------+---------------------+------------------+ ECA       89      17                                                      +----------+--------+--------+--------+---------------------+------------------+  +----------+--------+--------+----------------+-------------------+ SubclavianPSV cm/sEDV cm/sDescribe        Arm Pressure (mmHG) +----------+--------+--------+----------------+-------------------+           129             Multiphasic, WNL                    +----------+--------+--------+----------------+-------------------+ +---------+--------+--+--------+--+---------+ VertebralPSV cm/s46EDV cm/s12Antegrade +---------+--------+--+--------+--+---------+  Summary: Right Carotid: Velocities in the right ICA are consistent with a 1-39% stenosis. Left Carotid: Velocities in the left ICA are consistent with a 1-39% stenosis. Vertebrals:  Bilateral vertebral arteries demonstrate antegrade flow. Subclavians: Normal flow hemodynamics were seen in bilateral subclavian              arteries. *See table(s) above for measurements and observations.     Preliminary    Vas [place] Transcranial Doppler  Result Date: 01/18/2019  Transcranial Doppler Indications: Stroke. Performing Technologist: Maudry Mayhew MHA, RDMS, RVT, RDCS  Examination Guidelines: A complete evaluation includes B-mode imaging, spectral Doppler, color Doppler, and power Doppler as needed of all accessible portions of each vessel. Bilateral testing is considered an integral part of a complete examination. Limited examinations for reoccurring indications may be performed as noted.  +----------+-------------+----------+-----------+------------------+ RIGHT TCD Right VM (cm)Depth (cm)Pulsatility     Comment       +----------+-------------+----------+-----------+------------------+ MCA           70.00  0.85                       +----------+-------------+----------+-----------+------------------+ ACA                                         Unable to insonate +----------+-------------+----------+-----------+------------------+ Term ICA      43.00                 1.14                        +----------+-------------+----------+-----------+------------------+ PCA           24.00                 0.83                       +----------+-------------+----------+-----------+------------------+ Opthalmic     17.00                 1.51                       +----------+-------------+----------+-----------+------------------+ ICA siphon    25.00                 0.92                       +----------+-------------+----------+-----------+------------------+ Vertebral                                   Unable to insonate +----------+-------------+----------+-----------+------------------+  +----------+------------+----------+-----------+------------------+ LEFT TCD  Left VM (cm)Depth (cm)Pulsatility     Comment       +----------+------------+----------+-----------+------------------+ MCA          57.00                 0.97                       +----------+------------+----------+-----------+------------------+ ACA          -42.00                1.04                       +----------+------------+----------+-----------+------------------+ Term ICA     20.00                 1.20                       +----------+------------+----------+-----------+------------------+ PCA          13.00                 1.05                       +----------+------------+----------+-----------+------------------+ Opthalmic    18.00                 1.27                       +----------+------------+----------+-----------+------------------+ ICA siphon   51.00                 1.00                       +----------+------------+----------+-----------+------------------+  Vertebral                                  Unable to insonate +----------+------------+----------+-----------+------------------+     Preliminary     PHYSICAL EXAM Pleasant middle aged 59 male not in distress. . Afebrile. Head is nontraumatic. Neck is supple without bruit.    Cardiac exam  no murmur or gallop. Lungs are clear to auscultation. Distal pulses are well felt. Neurological Exam :  Awake alert oriented to time place and person.  Speech and language appear normal.  Follows commands well.  Extraocular movements are full range without nystagmus.  Visual fields are full to bedside confrontation testing.  Vision acuity seems adequate.  Fundi not visualized.  Face is symmetric without weakness.  Tongue midline.  Motor system exam reveals mild right upper extremity drift with weakness of right grip and intrinsic hand muscles.  Fine finger movements are diminished on the right.  Mild weakness of right hip flexors and ankle dorsiflexors 4/5.  Orbits left over right upper extremity.  Sensation is symmetric and intact bilaterally.  Gait not tested. ASSESSMENT/PLAN Mr. Nael Petrosyan is a 53 y.o. male with history of hypertension, spinal stenosis, recent admission for alcohol induced pancreatitis, old left-sided Bell's palsy presenting with a few days of right-sided weakness.  Neurological Exam :  Stroke:   Multiple foci B cerebral infarcts embolic secondary to unknown source  CT head No acute abnormalities.  Old R external capsule lacune.  Small R maxillary sinus mucosal retention cyst/polyp.    MRI multiple bilateral small foci of acute ischemia, largest and L precentral gyrus.  MRI CS no abnormality.  Moderate L neural foraminal stenosis C5-6 and C6-7  Carotid Doppler  B ICA 1-39% stenosis, VAs antegrade   2D Echo normal ejection fraction 60 to 65%.  No regional wall motion abnormalities.  Transcranial Doppler normal anterior circulation.  Absent occipital windows limits posterior circulation.  TEE and Loop to look for AF as possible source of stroke. Requested from cardiology. Made NPO after MN  LDL 85  HgbA1c 5.7  Added HIV and RPR pending   Hypercoagulable (venous and arterial) labs pending   Lovenox 40 mg sq daily for VTE prophylaxis  No antithrombotic prior to  admission, now on aspirin 325 mg daily. Given stroke, recommend aspirin 81 mg and plavix 75 mg daily x 3 weeks, then aspirin alone. Orders adjusted.   Therapy recommendations:  pending   Disposition:  pending   Hypertension  Stable . Permissive hypertension (OK if < 220/120) but gradually normalize in 5-7 days . Long-term BP goal normotensive  Hyperlipidemia  Home meds:  No statin  Now on crestor 20  LDL 85, goal < 70  Continue statin at discharge  Other Stroke Risk Factors  Cigarette smoker, advised to stop smoking  Former smokeless tobacco user, quit 8 years ago   ETOH abuse (12 drinks/wk), advised to drink no more than 2 drink(s) a day.    Congestive heart failure  Aortic atherosclerosis  Other Active Problems  Chronic abdominal pain; Recent admission for alcohol induced pancreatitis  CKD stage II  Reactive thrombocytosis  Chronic iron deficiency anemia  Hospital day # 0  I have personally obtained history,examined this patient, reviewed notes, independently viewed imaging studies, participated in medical decision making and plan of care.ROS completed by me personally and pertinent positives fully documented  I have made any additions or clarifications directly to  the above note.  He presented with right-sided weakness secondary to embolic by cerebral infarcts likely of cryptogenic etiology.  Recommend aspirin and Plavix for 3 weeks followed by aspirin alone.  Patient counseled to quit smoking and is agreeable.  Aggressive risk factor modification.  Recommend TEE and loop recorder tomorrow.  Cardiology has been called for the same.  Discussed with internal medicine resident on call.  Greater than 50% time during this 35-minute visit was spent on counseling and coordination of care about his stroke and answered questions  Antony Contras, MD Medical Director [place] Pager: [phone] 01/18/2019 4:43 PM   To contact Stroke Continuity provider,  please refer to [email or web address] After hours, contact General Neurology

## 2019-01-18 NOTE — ED Notes (Signed)
RN contacted Pharmacy to have Rx Crestor tubed to station [phone] as Rx is due now

## 2019-01-18 NOTE — Progress Notes (Signed)
  Echocardiogram 2D Echocardiogram has been performed.  Paul [name] 01/18/2019, 11:43 AM

## 2019-01-18 NOTE — ED Notes (Signed)
Diet ordered 

## 2019-01-18 NOTE — Evaluation (Signed)
Physical Therapy Evaluation Patient Details Name: Paul [name] MRN: [id number] DOB: 1965-10-08 Today's Date: 01/18/2019   History of Present Illness  Patient is a 53 y/o male presenting to the ED on 01/17/2019 with primary complaints of right sided weakness. CT head-negative, his MRI demonstrates Multiple small foci of acute ischemia within both cerebral hemispheres, the greatest along the medial aspect of the left precentral gyrus. Past medical history significant of recurrent pancreatitis, chronic left facial droop 2/2 Bell's Palsy, HFpEF, and hypertension.    Clinical Impression  Patient admitted with the above listed diagnosis. Patient reports independence at baseline for mobility and ADLs - works Architect. Patient today performing functional mobility at general min guard level for safety and balance. Patient states he has been ambulating in room without issue - noted mild instability with gait, no buckling - will need to continue to assess balance with mobility. Greatest complaint today is R UE weakness. Will currently recommend OPPT at discharge. PT to continue to follow acutely.      Follow Up Recommendations Outpatient PT;Supervision for mobility/OOB    Equipment Recommendations  None recommended by PT    Recommendations for Other Services       Precautions / Restrictions Precautions Precautions: Fall Restrictions Weight Bearing Restrictions: No      Mobility  Bed Mobility Overal bed mobility: Modified Independent                Transfers Overall transfer level: Needs assistance Equipment used: None Transfers: Sit to/from Stand;Stand Pivot Transfers Sit to Stand: Supervision Stand pivot transfers: Supervision       General transfer comment: for safety and immediate standing balance  Ambulation/Gait Ambulation/Gait assistance: Min guard;Supervision Gait Distance (Feet): 120 Feet Assistive device: None Gait Pattern/deviations: Step-through  pattern;Decreased stride length;Decreased weight shift to right Gait velocity: varying   General Gait Details: mild instability with gait - increased gait speed; mild impulsiveness with mobility  Stairs            Wheelchair Mobility    Modified Rankin (Stroke Patients Only) Modified Rankin (Stroke Patients Only) Pre-Morbid Rankin Score: No symptoms Modified Rankin: Moderately severe disability     Balance Overall balance assessment: Mild deficits observed, not formally tested(will continue to further assess)                                           Pertinent Vitals/Pain Pain Assessment: No/denies pain    Home Living Family/patient expects to be discharged to:: Private residence Living Arrangements: Alone   Type of Home: House Home Access: Stairs to enter   Technical brewer of Steps: ("a couple") Home Layout: One level Home Equipment: None      Prior Function Level of Independence: Independent         Comments: works in Electronics engineer        Extremity/Trunk Assessment   Upper Extremity Assessment Upper Extremity Assessment: Defer to OT evaluation    Lower Extremity Assessment Lower Extremity Assessment: RLE deficits/detail;LLE deficits/detail RLE Deficits / Details: grossly 4/5 to 4+/5 at hip, knee, ankle; no buckle in weight bearing LLE Deficits / Details: grossly 5/5    Cervical / Trunk Assessment Cervical / Trunk Assessment: Normal  Communication   Communication: No difficulties  Cognition Arousal/Alertness: Awake/alert Behavior During Therapy: WFL for tasks assessed/performed Overall Cognitive Status: Within Functional Limits for tasks assessed  General Comments General comments (skin integrity, edema, etc.): agitated that he is still in the ED; BP 155/90 following mobility - no subjective complaints    Exercises     Assessment/Plan     PT Assessment Patient needs continued PT services  PT Problem List Decreased strength;Decreased activity tolerance;Decreased balance;Decreased mobility;Decreased safety awareness;Decreased knowledge of precautions       PT Treatment Interventions DME instruction;Gait training;Stair training;Functional mobility training;Therapeutic activities;Therapeutic exercise;Balance training;Patient/family education;Neuromuscular re-education    PT Goals (Current goals can be found in the Care Plan section)  Acute Rehab PT Goals Patient Stated Goal: "Tell them to let me go home" PT Goal Formulation: With patient Time For Goal Achievement: 02/01/19 Potential to Achieve Goals: Good    Frequency Min 4X/week   Barriers to discharge        Co-evaluation               AM-PAC PT "6 Clicks" Mobility  Outcome Measure Help needed turning from your back to your side while in a flat bed without using bedrails?: None Help needed moving from lying on your back to sitting on the side of a flat bed without using bedrails?: None Help needed moving to and from a bed to a chair (including a wheelchair)?: A Little Help needed standing up from a chair using your arms (e.g., wheelchair or bedside chair)?: A Little Help needed to walk in hospital room?: A Little Help needed climbing 3-5 steps with a railing? : A Little 6 Click Score: 20    End of Session Equipment Utilized During Treatment: Gait belt Activity Tolerance: Patient tolerated treatment well Patient left: in bed;with call bell/phone within reach Nurse Communication: Mobility status PT Visit Diagnosis: Unsteadiness on feet (R26.81);Other abnormalities of gait and mobility (R26.89);Muscle weakness (generalized) (M62.81)    Time: 1411-1430 PT Time Calculation (min) (ACUTE ONLY): 19 min   Charges:   PT Evaluation $PT Eval Low Complexity: 1 Low          Lanney Gins, PT, DPT Supplemental Physical Therapist 01/18/19 2:40 PM Pager:  [phone] Office: [phone]

## 2019-01-19 ENCOUNTER — Encounter (HOSPITAL_COMMUNITY): Payer: Self-pay | Admitting: Cardiology

## 2019-01-19 ENCOUNTER — Other Ambulatory Visit: Payer: Self-pay | Admitting: Student

## 2019-01-19 ENCOUNTER — Inpatient Hospital Stay: Payer: Self-pay

## 2019-01-19 ENCOUNTER — Inpatient Hospital Stay (HOSPITAL_COMMUNITY): Payer: Self-pay

## 2019-01-19 ENCOUNTER — Encounter (HOSPITAL_COMMUNITY)
Admission: EM | Disposition: A | Discharge status: Home / Self Care | Payer: Self-pay | Source: Home / Self Care | Attending: Student in an Organized Health Care Education/Training Program

## 2019-01-19 DIAGNOSIS — G8321 Monoplegia of upper limb affecting right dominant side: Secondary | ICD-10-CM

## 2019-01-19 DIAGNOSIS — I6389 Other cerebral infarction: Secondary | ICD-10-CM

## 2019-01-19 DIAGNOSIS — I639 Cerebral infarction, unspecified: Secondary | ICD-10-CM

## 2019-01-19 HISTORY — PX: BUBBLE STUDY: SHX6837

## 2019-01-19 HISTORY — PX: TEE WITHOUT CARDIOVERSION: SHX5443

## 2019-01-19 LAB — CBC
HCT: 32 % — ABNORMAL LOW (ref 39.0–52.0)
Hemoglobin: 10.5 g/dL — ABNORMAL LOW (ref 13.0–17.0)
MCH: 29.7 pg (ref 26.0–34.0)
MCHC: 32.8 g/dL (ref 30.0–36.0)
MCV: 90.4 fL (ref 80.0–100.0)
Platelets: 759 10*3/uL — ABNORMAL HIGH (ref 150–400)
RBC: 3.54 MIL/uL — ABNORMAL LOW (ref 4.22–5.81)
RDW: 15.1 % (ref 11.5–15.5)
WBC: 8.8 10*3/uL (ref 4.0–10.5)
nRBC: 0 % (ref 0.0–0.2)

## 2019-01-19 LAB — LUPUS ANTICOAGULANT PANEL
DRVVT: 40.5 s (ref 0.0–47.0)
PTT Lupus Anticoagulant: 37.1 s (ref 0.0–51.9)

## 2019-01-19 LAB — BETA-2-GLYCOPROTEIN I ABS, IGG/M/A
Beta-2 Glyco I IgG: <9 GPI IgG units (ref 0–20)
Beta-2-Glycoprotein I IgA: <9 GPI IgA units (ref 0–25)
Beta-2-Glycoprotein I IgM: <9 GPI IgM units (ref 0–32)

## 2019-01-19 LAB — BASIC METABOLIC PANEL
Anion gap: 9 (ref 5–15)
BUN: 11 mg/dL (ref 6–20)
CO2: 30 mmol/L (ref 22–32)
Calcium: 9.3 mg/dL (ref 8.9–10.3)
Chloride: 100 mmol/L (ref 98–111)
Creatinine, Ser: 0.98 mg/dL (ref 0.61–1.24)
GFR calc Af Amer: >60 mL/min (ref >60)
GFR calc non Af Amer: >60 mL/min (ref >60)
Glucose, Bld: 117 mg/dL — ABNORMAL HIGH (ref 70–99)
Potassium: 4 mmol/L (ref 3.5–5.1)
Sodium: 139 mmol/L (ref 135–145)

## 2019-01-19 LAB — PROTEIN S ACTIVITY: Protein S Activity: 133 % (ref 63–140)

## 2019-01-19 LAB — HIV ANTIBODY (ROUTINE TESTING W REFLEX): HIV Screen 4th Generation wRfx: NONREACTIVE

## 2019-01-19 LAB — RPR: RPR Ser Ql: NONREACTIVE

## 2019-01-19 LAB — CARDIOLIPIN ANTIBODIES, IGG, IGM, IGA
Anticardiolipin IgA: <9 APL U/mL (ref 0–11)
Anticardiolipin IgG: <9 GPL U/mL (ref 0–14)
Anticardiolipin IgM: <9 MPL U/mL (ref 0–12)

## 2019-01-19 LAB — PROTEIN C, TOTAL: Protein C, Total: 116 % (ref 60–150)

## 2019-01-19 LAB — PROTEIN S, TOTAL: Protein S Ag, Total: 119 % (ref 60–150)

## 2019-01-19 LAB — HOMOCYSTEINE: Homocysteine: 16.9 umol/L — ABNORMAL HIGH (ref 0.0–14.5)

## 2019-01-19 LAB — PROTEIN C ACTIVITY: Protein C Activity: 150 % (ref 73–180)

## 2019-01-19 LAB — SAVE SMEAR(SSMR), FOR PROVIDER SLIDE REVIEW

## 2019-01-19 SURGERY — ECHOCARDIOGRAM, TRANSESOPHAGEAL
Anesthesia: Moderate Sedation

## 2019-01-19 MED ORDER — FENTANYL CITRATE (PF) 100 MCG/2ML IJ SOLN
INTRAMUSCULAR | Status: DC | PRN
  Administered 2019-01-19 (×4): 25 ug via INTRAVENOUS

## 2019-01-19 MED ORDER — SODIUM CHLORIDE 0.9 % IV SOLN
INTRAVENOUS | Status: DC
  Administered 2019-01-19: 14:00:00 via INTRAVENOUS

## 2019-01-19 MED ORDER — FENTANYL CITRATE (PF) 100 MCG/2ML IJ SOLN
INTRAMUSCULAR | Status: AC
  Filled 2019-01-19: qty 4

## 2019-01-19 MED ORDER — DIPHENHYDRAMINE HCL 50 MG/ML IJ SOLN
INTRAMUSCULAR | Status: AC
  Filled 2019-01-19: qty 1

## 2019-01-19 MED ORDER — MIDAZOLAM HCL (PF) 10 MG/2ML IJ SOLN
INTRAMUSCULAR | Status: DC | PRN
  Administered 2019-01-19 (×4): 2 mg via INTRAVENOUS

## 2019-01-19 MED ORDER — BUTAMBEN-TETRACAINE-BENZOCAINE 2-2-14 % EX AERO
INHALATION_SPRAY | CUTANEOUS | Status: DC | PRN
  Administered 2019-01-19: 1 via TOPICAL

## 2019-01-19 MED ORDER — MIDAZOLAM HCL (PF) 5 MG/ML IJ SOLN
INTRAMUSCULAR | Status: AC
  Filled 2019-01-19: qty 2

## 2019-01-19 MED ORDER — DIPHENHYDRAMINE HCL 50 MG/ML IJ SOLN
INTRAMUSCULAR | Status: DC | PRN
  Administered 2019-01-19: 25 mg via INTRAVENOUS

## 2019-01-19 MED ORDER — IOHEXOL 300 MG/ML  SOLN
100.0000 mL | Freq: Once | INTRAMUSCULAR | Status: AC | PRN
  Administered 2019-01-19: 100 mL via INTRAVENOUS

## 2019-01-19 NOTE — Progress Notes (Signed)
Physical Therapy Treatment Patient Details Name: Paul [name] MRN: [id number] DOB: 06-20-1966 Today's Date: 01/19/2019    History of Present Illness Patient is a 53 y/o male presenting to the ED on 01/17/2019 with primary complaints of right sided weakness. CT head-negative, his MRI demonstrates Multiple small foci of acute ischemia within both cerebral hemispheres, the greatest along the medial aspect of the left precentral gyrus. Past medical history significant of recurrent pancreatitis, chronic left facial droop 2/2 Bell's Palsy, HFpEF, and hypertension.    PT Comments    Patient seen to further assess balance. Mild instability with gait with patient demonstrating impulsivity - likely at baseline. Patient without significant LOB or instability and did not require physical assist. May continue to benefit from OPPT at discharge to progress high level balance.     Follow Up Recommendations  Outpatient PT;Supervision for mobility/OOB     Equipment Recommendations  None recommended by PT    Recommendations for Other Services       Precautions / Restrictions Precautions Precautions: Fall Restrictions Weight Bearing Restrictions: No    Mobility  Bed Mobility Overal bed mobility: Modified Independent                Transfers Overall transfer level: Needs assistance Equipment used: None Transfers: Sit to/from Stand;Stand Pivot Transfers Sit to Stand: Supervision Stand pivot transfers: Supervision       General transfer comment: for safety and immediate standing balance  Ambulation/Gait Ambulation/Gait assistance: Min guard;Supervision Gait Distance (Feet): 250 Feet Assistive device: None Gait Pattern/deviations: Step-through pattern Gait velocity: varying   General Gait Details: continued mild instability with patient demonstrating impulsivity - likely baseline   Stairs Stairs: Yes Stairs assistance: Supervision Stair Management: Alternating pattern;No  rails;Forwards Number of Stairs: 2     Wheelchair Mobility    Modified Rankin (Stroke Patients Only) Modified Rankin (Stroke Patients Only) Pre-Morbid Rankin Score: No symptoms Modified Rankin: Moderately severe disability     Balance                                 Standardized Balance Assessment Standardized Balance Assessment : Dynamic Gait Index   Dynamic Gait Index Level Surface: Normal Change in Gait Speed: Mild Impairment Gait with Horizontal Head Turns: Mild Impairment Gait with Vertical Head Turns: Mild Impairment Gait and Pivot Turn: Mild Impairment Step Over Obstacle: Mild Impairment Step Around Obstacles: Normal Steps: Normal Total Score: 19      Cognition Arousal/Alertness: Awake/alert Behavior During Therapy: WFL for tasks assessed/performed Overall Cognitive Status: Within Functional Limits for tasks assessed                                        Exercises      General Comments General comments (skin integrity, edema, etc.): patient tending to joke throughout session - mild impulsivity       Pertinent Vitals/Pain Pain Assessment: No/denies pain    Home Living Family/patient expects to be discharged to:: Private residence Living Arrangements: Alone   Type of Home: House Home Access: Stairs to enter   Home Layout: One level Home Equipment: None      Prior Function            PT Goals (current goals can now be found in the care plan section) Acute Rehab PT Goals Patient Stated Goal: "Tell them to  let me go home" PT Goal Formulation: With patient Time For Goal Achievement: 02/01/19 Potential to Achieve Goals: Good Progress towards PT goals: Progressing toward goals    Frequency    Min 4X/week      PT Plan Current plan remains appropriate    Co-evaluation              AM-PAC PT "6 Clicks" Mobility   Outcome Measure  Help needed turning from your back to your side while in a flat bed  without using bedrails?: None Help needed moving from lying on your back to sitting on the side of a flat bed without using bedrails?: None Help needed moving to and from a bed to a chair (including a wheelchair)?: None Help needed standing up from a chair using your arms (e.g., wheelchair or bedside chair)?: A Little Help needed to walk in hospital room?: A Little Help needed climbing 3-5 steps with a railing? : A Little 6 Click Score: 21    End of Session Equipment Utilized During Treatment: Gait belt Activity Tolerance: Patient tolerated treatment well Patient left: (transport picking patient up for TEE) Nurse Communication: Mobility status PT Visit Diagnosis: Unsteadiness on feet (R26.81);Other abnormalities of gait and mobility (R26.89);Muscle weakness (generalized) (M62.81)     Time: 1314-3888 PT Time Calculation (min) (ACUTE ONLY): 13 min  Charges:  $Gait Training: 8-22 mins                     Lanney Gins, PT, DPT Supplemental Physical Therapist 01/19/19 1:51 PM Pager: [phone] Office: [phone]

## 2019-01-19 NOTE — Interval H&P Note (Signed)
History and Physical Interval Note:  01/19/2019 1:23 PM  Paul [name]  has presented today for surgery, with the diagnosis of stroke.  The various methods of treatment have been discussed with the patient and family. After consideration of risks, benefits and other options for treatment, the patient has consented to  Procedure(s): TRANSESOPHAGEAL ECHOCARDIOGRAM (TEE) (N/A) as a surgical intervention.  The patient's history has been reviewed, patient examined, no change in status, stable for surgery.  I have reviewed the patient's chart and labs.  Questions were answered to the patient's satisfaction.     Parminder Trapani Harrell Gave

## 2019-01-19 NOTE — TOC Transition Note (Signed)
Transition of Care [facility]) - CM/SW Discharge Note   Patient Details  Name: Jacorie Ernsberger MRN: [id number] Date of Birth: [birth year]  Transition of Care [facility]) CM/SW Contact:  Pollie Friar, RN Phone Number: 01/19/2019, 2:26 PM   Clinical Narrative:    TOC met with the patient and he is agreeable to outpatient therapy and wants to attend at [facility]. Orders in Epic and information on the AVS.  Pt to f/u with [facility].  TOC following to see if assistance needed with d/c medications.   Final next level of care: OP Rehab Barriers to Discharge: Inadequate or no insurance   Patient Goals and CMS Choice     Choice offered to / list presented to : Patient  Discharge Placement                       Discharge Plan and Services   Discharge Planning Services: CM Consult                                 Social Determinants of Health (SDOH) Interventions     Readmission Risk Interventions Readmission Risk Prevention Plan 11/10/2018  PCP or Specialist Appt within 5-7 Days Not Complete  Not Complete comments [facility] (this was the first availabale appt they had)  Some recent data might be hidden

## 2019-01-19 NOTE — Progress Notes (Signed)
STROKE TEAM PROGRESS NOTE     INTERVAL HISTORY Patient states he is doing well.  His right hand weakness is improved.  He had TEE today which showed no cardiac source of embolism. or clot.  Patient states he cannot afford the loop recorder as he has no insurance.  Vitals:   01/19/19 1403 01/19/19 1408 01/19/19 1410 01/19/19 1425  BP: (!) 153/88 (!) 151/87 (!) 149/92 (!) 149/87  Pulse: (!) 57 60 (!) 57 (!) 55  Resp: 19 17 20 15   Temp:   98.7 F (37.1 C)   TempSrc:   Oral   SpO2: 100% 99% 98% 99%  Weight:      Height:        CBC:  Recent Labs  Lab 01/17/19 1654 01/17/19 1719 01/19/19 0505  WBC 10.4  --  8.8  NEUTROABS 6.3  --   --   HGB 10.2* 10.9* 10.5*  HCT 32.0* 32.0* 32.0*  MCV 92.0  --  90.4  PLT 730*  --  759*    Basic Metabolic Panel:  Recent Labs  Lab 01/17/19 1654 01/17/19 1719 01/19/19 0505  NA 138 138 139  K 3.5 3.7 4.0  CL 99 98 100  CO2 28  --  30  GLUCOSE 124* 121* 117*  BUN 13 13 11   CREATININE 1.03 0.90 0.98  CALCIUM 9.1  --  9.3   Lipid Panel:     Component Value Date/Time   CHOL 133 01/18/2019 0415   TRIG 108 01/18/2019 0415   HDL 26 (L) 01/18/2019 0415   CHOLHDL 5.1 01/18/2019 0415   VLDL 22 01/18/2019 0415   LDLCALC 85 01/18/2019 0415   HgbA1c:  Lab Results  Component Value Date   HGBA1C 5.7 (H) 01/18/2019   Urine Drug Screen: No results found for: LABOPIA, COCAINSCRNUR, LABBENZ, AMPHETMU, THCU, LABBARB  Alcohol Level     Component Value Date/Time   ETH <10 01/07/2019 0543    IMAGING Ct Head Wo Contrast  Result Date: 01/17/2019 CLINICAL DATA:  53 year old male with history of right-sided weakness for 1 day. Tingling on the right side. EXAM: CT HEAD WITHOUT CONTRAST TECHNIQUE: Contiguous axial images were obtained from the base of the skull through the vertex without intravenous contrast. COMPARISON:  Head CT 03/12/2012. FINDINGS: Brain: Well-defined focus of low attenuation in the right external capsule (axial image 17 of  series 3), new compared to the prior study, but most compatible with an old lacunar infarct. No evidence of acute infarction, hemorrhage, hydrocephalus, extra-axial collection or mass lesion/mass effect. Vascular: No hyperdense vessel or unexpected calcification. Skull: Normal. Negative for fracture or focal lesion. Sinuses/Orbits: Small mucosal retention cyst or polyp in the posterior aspect of the right maxillary sinus. No acute findings. Other: None. IMPRESSION: 1. No acute intracranial abnormalities. 2. Old lacunar infarct in the right external capsule. 3. Small mucosal retention cyst or polyp in the posterior aspect of the right maxillary sinus incidentally noted. Electronically Signed   By: Vinnie Langton M.D.   On: 01/17/2019 18:02   Mr Brain Wo Contrast  Result Date: 01/18/2019 CLINICAL DATA:  Right-sided weakness and tingling. EXAM: MRI HEAD WITHOUT CONTRAST MRI CERVICAL SPINE WITHOUT CONTRAST TECHNIQUE: Multiplanar, multiecho pulse sequences of the brain and surrounding structures, and cervical spine, to include the craniocervical junction and cervicothoracic junction, were obtained without intravenous contrast. COMPARISON:  Head CT 01/17/2019 FINDINGS: MRI HEAD FINDINGS BRAIN: There are scattered foci of acute ischemia within both hemispheres. The largest area is along the medial  aspect of the left precentral gyrus. There are small areas in the left parietal and occipital lobes and in the right occipital lobe. The midline structures are normal. Multifocal white matter hyperintensity, most commonly due to chronic ischemic microangiopathy. The cerebral and cerebellar volume are age-appropriate. No hydrocephalus. There are 3-5 foci of chronic microhemorrhage, predominantly central. No midline shift or other mass effect. VASCULAR: The major intracranial arterial and venous sinus flow voids are normal. SKULL AND UPPER CERVICAL SPINE: Calvarial bone marrow signal is normal. There is no skull base mass.  Visualized upper cervical spine and soft tissues are normal. SINUSES/ORBITS: No fluid levels or advanced mucosal thickening. No mastoid or middle ear effusion. The orbits are normal. MRI CERVICAL SPINE FINDINGS Alignment: Physiologic. Vertebrae: No fracture, evidence of discitis, or bone lesion. Cord: Normal signal and morphology. Posterior Fossa, vertebral arteries, paraspinal tissues: Negative. Disc levels: C2-3: No disc herniation or stenosis. C3-4: Small central disc protrusion without spinal canal or neural foraminal stenosis. C4-5: No disc herniation or stenosis. C5-6: Small disc osteophyte complex with mild right and moderate left foraminal narrowing. No central spinal canal stenosis. C6-7: Left uncovertebral disc osteophyte complex with moderate left foraminal stenosis. No spinal canal stenosis. C7-T1: No disc herniation or stenosis. Sagittal imaging of the T1-T3 levels is normal. At T3-4, there is a small central disc protrusion without spinal canal stenosis. IMPRESSION: 1. Multiple small foci of acute ischemia within both cerebral hemispheres, the greatest along the medial aspect of the left precentral gyrus. This location is in keeping with reported right-sided symptoms. 2. The distribution within multiple vascular territories may indicate a central cardiac or aortic embolic process. 3. No cervical spinal cord abnormality. 4. Moderate left neural foraminal stenosis at C5-6 and C6-7. Electronically Signed   By: Ulyses Jarred M.D.   On: 01/18/2019 01:16   Mr Cervical Spine Wo Contrast  Result Date: 01/18/2019 CLINICAL DATA:  Right-sided weakness and tingling. EXAM: MRI HEAD WITHOUT CONTRAST MRI CERVICAL SPINE WITHOUT CONTRAST TECHNIQUE: Multiplanar, multiecho pulse sequences of the brain and surrounding structures, and cervical spine, to include the craniocervical junction and cervicothoracic junction, were obtained without intravenous contrast. COMPARISON:  Head CT 01/17/2019 FINDINGS: MRI HEAD  FINDINGS BRAIN: There are scattered foci of acute ischemia within both hemispheres. The largest area is along the medial aspect of the left precentral gyrus. There are small areas in the left parietal and occipital lobes and in the right occipital lobe. The midline structures are normal. Multifocal white matter hyperintensity, most commonly due to chronic ischemic microangiopathy. The cerebral and cerebellar volume are age-appropriate. No hydrocephalus. There are 3-5 foci of chronic microhemorrhage, predominantly central. No midline shift or other mass effect. VASCULAR: The major intracranial arterial and venous sinus flow voids are normal. SKULL AND UPPER CERVICAL SPINE: Calvarial bone marrow signal is normal. There is no skull base mass. Visualized upper cervical spine and soft tissues are normal. SINUSES/ORBITS: No fluid levels or advanced mucosal thickening. No mastoid or middle ear effusion. The orbits are normal. MRI CERVICAL SPINE FINDINGS Alignment: Physiologic. Vertebrae: No fracture, evidence of discitis, or bone lesion. Cord: Normal signal and morphology. Posterior Fossa, vertebral arteries, paraspinal tissues: Negative. Disc levels: C2-3: No disc herniation or stenosis. C3-4: Small central disc protrusion without spinal canal or neural foraminal stenosis. C4-5: No disc herniation or stenosis. C5-6: Small disc osteophyte complex with mild right and moderate left foraminal narrowing. No central spinal canal stenosis. C6-7: Left uncovertebral disc osteophyte complex with moderate left foraminal stenosis. No spinal canal  stenosis. C7-T1: No disc herniation or stenosis. Sagittal imaging of the T1-T3 levels is normal. At T3-4, there is a small central disc protrusion without spinal canal stenosis. IMPRESSION: 1. Multiple small foci of acute ischemia within both cerebral hemispheres, the greatest along the medial aspect of the left precentral gyrus. This location is in keeping with reported right-sided  symptoms. 2. The distribution within multiple vascular territories may indicate a central cardiac or aortic embolic process. 3. No cervical spinal cord abnormality. 4. Moderate left neural foraminal stenosis at C5-6 and C6-7. Electronically Signed   By: Ulyses Jarred M.D.   On: 01/18/2019 01:16   Vas US Carotid (at [facility] Only)  Result Date: 01/18/2019 Carotid Arterial Duplex Study Indications:       CVA. Risk Factors:      Hypertension. Comparison Study:  No prior study Performing Technologist: Maudry Mayhew MHA, RDMS, RVT, RDCS  Examination Guidelines: A complete evaluation includes B-mode imaging, spectral Doppler, color Doppler, and power Doppler as needed of all accessible portions of each vessel. Bilateral testing is considered an integral part of a complete examination. Limited examinations for reoccurring indications may be performed as noted.  Right Carotid Findings: +----------+--------+--------+--------+--------------------------+--------+           PSV cm/sEDV cm/sStenosisDescribe                  Comments +----------+--------+--------+--------+--------------------------+--------+ CCA Prox  86      17                                                 +----------+--------+--------+--------+--------------------------+--------+ CCA Distal55      16              smooth and heterogenous            +----------+--------+--------+--------+--------------------------+--------+ ICA Prox  68      21              irregular and heterogenous         +----------+--------+--------+--------+--------------------------+--------+ ICA Distal71      28                                                 +----------+--------+--------+--------+--------------------------+--------+ ECA       89      15                                                 +----------+--------+--------+--------+--------------------------+--------+  +----------+--------+-------+----------------+-------------------+           PSV cm/sEDV cmsDescribe        Arm Pressure (mmHG) +----------+--------+-------+----------------+-------------------+ [id number]             Multiphasic, WNL                    +----------+--------+-------+----------------+-------------------+ +---------+--------+--+--------+--+---------+ VertebralPSV cm/s54EDV cm/s17Antegrade +---------+--------+--+--------+--+---------+  Left Carotid Findings: +----------+--------+--------+--------+---------------------+------------------+           PSV cm/sEDV cm/sStenosisDescribe             Comments           +----------+--------+--------+--------+---------------------+------------------+ CCA Prox  89  17                                   intimal thickening +----------+--------+--------+--------+---------------------+------------------+ CCA Distal68      14                                   intimal thickening +----------+--------+--------+--------+---------------------+------------------+ ICA Prox  84      26              smooth, heterogenous                                                      and calcific                            +----------+--------+--------+--------+---------------------+------------------+ ICA Distal71      29                                                      +----------+--------+--------+--------+---------------------+------------------+ ECA       89      17                                                      +----------+--------+--------+--------+---------------------+------------------+ +----------+--------+--------+----------------+-------------------+ SubclavianPSV cm/sEDV cm/sDescribe        Arm Pressure (mmHG) +----------+--------+--------+----------------+-------------------+           129             Multiphasic, WNL                     +----------+--------+--------+----------------+-------------------+ +---------+--------+--+--------+--+---------+ VertebralPSV cm/s46EDV cm/s12Antegrade +---------+--------+--+--------+--+---------+  Summary: Right Carotid: Velocities in the right ICA are consistent with a 1-39% stenosis. Left Carotid: Velocities in the left ICA are consistent with a 1-39% stenosis. Vertebrals:  Bilateral vertebral arteries demonstrate antegrade flow. Subclavians: Normal flow hemodynamics were seen in bilateral subclavian              arteries. *See table(s) above for measurements and observations.  Electronically signed by Antony Contras MD on 01/18/2019 at 1:36:32 PM.    Final    Vas [place] Transcranial Doppler  Result Date: 01/18/2019  Transcranial Doppler Indications: Stroke. Comparison Study: No prior study Performing Technologist: Maudry Mayhew MHA, RDMS, RVT, RDCS  Examination Guidelines: A complete evaluation includes B-mode imaging, spectral Doppler, color Doppler, and power Doppler as needed of all accessible portions of each vessel. Bilateral testing is considered an integral part of a complete examination. Limited examinations for reoccurring indications may be performed as noted.  +----------+-------------+----------+-----------+------------------+ RIGHT TCD Right VM (cm)Depth (cm)Pulsatility     Comment       +----------+-------------+----------+-----------+------------------+ MCA           70.00                 0.85                       +----------+-------------+----------+-----------+------------------+  ACA                                         Unable to insonate +----------+-------------+----------+-----------+------------------+ Term ICA      43.00                 1.14                       +----------+-------------+----------+-----------+------------------+ PCA           24.00                 0.83                        +----------+-------------+----------+-----------+------------------+ Opthalmic     17.00                 1.51                       +----------+-------------+----------+-----------+------------------+ ICA siphon    25.00                 0.92                       +----------+-------------+----------+-----------+------------------+ Vertebral                                   Unable to insonate +----------+-------------+----------+-----------+------------------+  +----------+------------+----------+-----------+------------------+ LEFT TCD  Left VM (cm)Depth (cm)Pulsatility     Comment       +----------+------------+----------+-----------+------------------+ MCA          57.00                 0.97                       +----------+------------+----------+-----------+------------------+ ACA          -42.00                1.04                       +----------+------------+----------+-----------+------------------+ Term ICA     20.00                 1.20                       +----------+------------+----------+-----------+------------------+ PCA          13.00                 1.05                       +----------+------------+----------+-----------+------------------+ Opthalmic    18.00                 1.27                       +----------+------------+----------+-----------+------------------+ ICA siphon   51.00                 1.00                       +----------+------------+----------+-----------+------------------+ Vertebral  Unable to insonate +----------+------------+----------+-----------+------------------+  Summary:  Normalmean flow velocities in identified vessels of anterior cerbral circulation. Absent occipital window limits evaluation of posterior circulation vessels. *See table(s) above for measurements and observations.  Diagnosing physician: Antony Contras MD Electronically signed by Antony Contras MD on  01/18/2019 at 1:38:09 PM.    Final     PHYSICAL EXAM Pleasant middle aged 53 male not in distress. . Afebrile. Head is nontraumatic. Neck is supple without bruit.    Cardiac exam no murmur or gallop. Lungs are clear to auscultation. Distal pulses are well felt. Neurological Exam :  Awake alert oriented to time place and person.  Speech and language appear normal.  Follows commands well.  Extraocular movements are full range without nystagmus.  Visual fields are full to bedside confrontation testing.  Vision acuity seems adequate.  Fundi not visualized.  Face is symmetric without weakness.  Tongue midline.  Motor system exam reveals mild weakness of right grip and intrinsic hand muscles.  Fine finger movements are diminished on the right.  Mild weakness of right hip flexors and ankle dorsiflexors 4/5.  Orbits left over right upper extremity.  Sensation is symmetric and intact bilaterally.  Gait not tested. ASSESSMENT/PLAN [name] is a 53 y.o. male with history of hypertension, spinal stenosis, recent admission for alcohol induced pancreatitis, old left-sided Bell's palsy presenting with a few days of right-sided weakness.  Neurological Exam :  Stroke:   Multiple foci B cerebral infarcts embolic secondary to unknown source  CT head No acute abnormalities.  Old R external capsule lacune.  Small R maxillary sinus mucosal retention cyst/polyp.    MRI multiple bilateral small foci of acute ischemia, largest and L precentral gyrus.  MRI CS no abnormality.  Moderate L neural foraminal stenosis C5-6 and C6-7  Carotid Doppler  B ICA 1-39% stenosis, VAs antegrade   2D Echo normal ejection fraction 60 to 65%.  No regional wall motion abnormalities.  Transcranial Doppler normal anterior circulation.  Absent occipital windows limits posterior circulation.  TEE no cardiac source of embolism.  LDL 85  HgbA1c 5.7  Loop recorder patient unable to afford due to having no insurance    HIV and  RPR negative  Hypercoagulable (venous and arterial) labs pending.  Homocystine slightly elevated at 16.9  Lovenox 40 mg sq daily for VTE prophylaxis  No antithrombotic prior to admission, now on aspirin 325 mg daily. Given stroke, recommend aspirin 81 mg and plavix 75 mg daily x 3 weeks, then aspirin alone. Orders adjusted.   Therapy recommendations:  pending   Disposition:  pending   Hypertension  Stable . Permissive hypertension (OK if < 220/120) but gradually normalize in 5-7 days . Long-term BP goal normotensive  Hyperlipidemia  Home meds:  No statin  Now on crestor 20  LDL 85, goal < 70  Continue statin at discharge  Other Stroke Risk Factors  Cigarette smoker, advised to stop smoking  Former smokeless tobacco user, quit 8 years ago   ETOH abuse (12 drinks/wk), advised to drink no more than 2 drink(s) a day.    Congestive heart failure  Aortic atherosclerosis  Other Active Problems  Chronic abdominal pain; Recent admission for alcohol induced pancreatitis  CKD stage II  Reactive thrombocytosis  Chronic iron deficiency anemia  Hospital day # 1   .  He presented with right-sided weakness secondary to embolic by cerebral infarcts likely of cryptogenic etiology.  Recommend aspirin and Plavix for 3 weeks followed by aspirin alone.  Patient counseled to quit smoking and is agreeable.  Aggressive risk factor modification.Marland Kitchen  He may possibly consider participation in the [place] trial for stroke prevention.  Follow-up as an outpatient in the stroke clinic in 6 weeks.  Stroke team will sign off. Antony Contras, MD Medical Director [place] Pager: [phone] 01/19/2019 3:32 PM   To contact Stroke Continuity provider, please refer to [email or web address] After hours, contact General Neurology

## 2019-01-19 NOTE — TOC Initial Note (Signed)
Transition of Care [facility]) - Initial/Assessment Note    Patient Details  Name: Paul [name] MRN: [id number] Date of Birth: July 20, 1966  [facility]) CM/SW Contact:    Pollie Friar, RN Phone Number: 01/19/2019, 2:26 PM  Clinical Narrative:                   Expected Discharge Plan: OP Rehab Barriers to Discharge: Inadequate or no insurance   Patient Goals and CMS Choice     Choice offered to / list presented to : Patient  Expected Discharge Plan and Services Expected Discharge Plan: OP Rehab   Discharge Planning Services: CM Consult   Living arrangements for the past 2 months: Single Family Home                                      Prior Living Arrangements/Services Living arrangements for the past 2 months: Single Family Home Lives with:: Self Patient language and need for interpreter reviewed:: Yes(no needs) Do you feel safe going back to the place where you live?: Yes      Need for Family Participation in Patient Care: Yes (Comment) Care giver support system in place?: Yes (comment)(pt states he has family that can assist at home)   Criminal Activity/Legal Involvement Pertinent to Current Situation/Hospitalization: No - Comment as needed  Activities of Daily Living      Permission Sought/Granted                  Emotional Assessment Appearance:: Appears stated age Attitude/Demeanor/Rapport: Guarded Affect (typically observed): Accepting Orientation: : Oriented to Self, Oriented to Place, Oriented to  Time, Oriented to Situation   Psych Involvement: No (comment)  Admission diagnosis:  Stroke (cerebrum) (HCC) [I63.9] Cerebrovascular accident (CVA), unspecified mechanism ([place]) [I63.9] Patient Active Problem List   Diagnosis Date Noted  . Acute CVA (cerebrovascular accident) ([place]) 01/18/2019  . Thrombocytosis ([place]) 01/18/2019  . Pancreatitis 01/07/2019  . Chronic diastolic CHF (congestive heart failure) ([place]) 10/11/2018  . CKD  (chronic kidney disease) stage 2, GFR 60-89 ml/min 10/11/2018  . Essential hypertension 05/22/2018  . Dyslipidemia 05/22/2018  . Renal lesion 05/22/2018  . Coronary artery calcification 05/22/2018  . Tobacco abuse 05/22/2018   PCP:  Patient, No Pcp Per Pharmacy:   [id number] [place], [place]  [address] Phone: [phone] Fax: [phone]  [facility] [place], [place] [address] Phone: [phone] Fax: [phone]     Social Determinants of Health (SDOH) Interventions    Readmission Risk Interventions Readmission Risk Prevention Plan 11/10/2018  PCP or Specialist Appt within 5-7 Days Not Complete  Not Complete comments [facility] (this was the first availabale appt they had)  Some recent data might be hidden

## 2019-01-19 NOTE — H&P (View-Only) (Signed)
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 and dry for 3 days).  Wound check Kaylean Tupou be scheduled and entered in AVS if decide to proceed with loop.   Please call with questions.  Paul Freer, PA-C 01/19/2019 9:47 AM  I have seen and examined this patient with Paul [name].  Agree with above, note added to reflect my findings.  On exam, RRR, no murmurs, lungs clear. Patient s/p CVA. No cause found. Discussed LINQ monitor but patient without insurance. Paul [name] thus plan for 30 day monitor to see if AF is present. If not positive, Paul [name] possibly implant an LINQ at a later date but he would like to think about this further.    Paul Hodge M. Bray Vickerman MD 01/19/2019 10:59 AM

## 2019-01-19 NOTE — Evaluation (Signed)
Occupational Therapy Evaluation Patient Details Name: Paul [name] MRN: [id number] DOB: 1965/12/19 Today's Date: 01/19/2019    History of Present Illness Patient is a 53 y/o male presenting to the ED on 01/17/2019 with primary complaints of right sided weakness. CT head-negative, his MRI demonstrates Multiple small foci of acute ischemia within both cerebral hemispheres, the greatest along the medial aspect of the left precentral gyrus. Past medical history significant of recurrent pancreatitis, chronic left facial droop 2/2 Bell's Palsy, HFpEF, and hypertension.   Clinical Impression   Pt PTA: Pt independent with ADL, mobility, and working. Pt currently performing ADL functional transfers and mobility with no AD and fair balance. Pt with L visual field deficit in middle portion of peripheral vision and RUE coordination deficits and strength deficits. Pt independent with ADL and mobility at this time. Pt reports differing opinions on RUE sensation as tingly vs not.  Pt would benefit from continued OT skilled services for RUE HEP and vision. OT following acutely.      Follow Up Recommendations  No OT follow up    Equipment Recommendations  None recommended by OT    Recommendations for Other Services       Precautions / Restrictions Precautions Precautions: Fall Restrictions Weight Bearing Restrictions: No      Mobility Bed Mobility Overal bed mobility: Modified Independent                Transfers Overall transfer level: Needs assistance Equipment used: None Transfers: Sit to/from Stand;Stand Pivot Transfers Sit to Stand: Supervision Stand pivot transfers: Supervision       General transfer comment: for safety    Balance Overall balance assessment: Modified Independent                                       ADL either performed or assessed with clinical judgement   ADL Overall ADL's : At baseline                                        General ADL Comments: Pt's RUE fine motor coordination impaired, thus IADLs of writing and using [place] amy be hindered, but for BADL, pt continues to be independent.     Vision Baseline Vision/History: No visual deficits Vision Assessment?: Yes Eye Alignment: Within Functional Limits Ocular Range of Motion: Within Functional Limits Alignment/Gaze Preference: Within Defined Limits Tracking/Visual Pursuits: Able to track stimulus in all quads without difficulty Saccades: Within functional limits Convergence: Within functional limits Additional Comments: L visual field deficits in peripheral and middle vision portion. Pt with decreased disdiadokinesia in RUE.     Perception Perception Perception Tested?: Yes Spatial deficits: proprioception, WFL   Praxis      Pertinent Vitals/Pain Pain Assessment: No/denies pain     Hand Dominance Right   Extremity/Trunk Assessment Upper Extremity Assessment Upper Extremity Assessment: Generalized weakness;RUE deficits/detail RUE Deficits / Details: poor coordination and MMT 3+/5 MM grade  RUE Coordination: decreased fine motor;decreased gross motor   Lower Extremity Assessment Lower Extremity Assessment: Overall WFL for tasks assessed   Cervical / Trunk Assessment Cervical / Trunk Assessment: Normal   Communication Communication Communication: No difficulties   Cognition Arousal/Alertness: Awake/alert Behavior During Therapy: WFL for tasks assessed/performed Overall Cognitive Status: Within Functional Limits for tasks assessed  General Comments  Pt impulsive and tolerated session fair with direct cues to attend to tasks. Vision extensively tested and fine motor coordination is poor in RUE. Pt brushing off this OTR's findings and stating "I know my R hand is going to be weak. 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: Decreased coordination;Decreased safety awareness;Impaired UE functional use      OT Treatment/Interventions: Self-care/ADL training;Therapeutic exercise;Neuromuscular education;Energy conservation;DME and/or AE instruction;Therapeutic activities;Patient/family education;Balance training    OT Goals(Current goals can be found in the care plan section) Acute Rehab OT Goals Patient Stated Goal: to go home OT Goal Formulation: With patient Time For Goal Achievement: 02/02/19 Potential to Achieve Goals: Good ADL Goals Pt/caregiver will Perform Home Exercise Program: Right Upper extremity;With theraputty;Independently;With written HEP provided Additional ADL Goal #1: Pt will complete fine motor coordination exercises with RUE with 75% accuracy in 3/3 trials  OT Frequency: Min 2X/week   Barriers to D/C: Decreased caregiver support  lives alone       Co-evaluation              AM-PAC OT "6 Clicks" Daily Activity     Outcome Measure Help from another person eating meals?: None Help from another person taking care of personal grooming?: None Help from another person toileting, which includes using toliet, bedpan, or urinal?: None Help from another person bathing (including washing, rinsing, drying)?: None Help from another person to put on and taking off regular upper body clothing?: None Help from another person to put on and taking off regular lower body clothing?: None 6 Click Score: 24   End of Session Equipment Utilized During Treatment: Gait belt Nurse Communication: Other  (comment)(bathing at sink)  Activity Tolerance: Patient tolerated treatment well Patient left: Other (comment)(standing at sink for bathing routine)  OT Visit Diagnosis: Unsteadiness on feet (R26.81);Other symptoms and signs involving the nervous system (R29.898)                Time: 3875-6433 OT Time Calculation (min): 16 min Charges:  OT General Charges $OT Visit: 1 Visit OT Evaluation $OT Eval Moderate Complexity: 1 Mod  Darryl Nestle) Marsa Aris OTR/L Acute Rehabilitation Services Pager: [phone] Office: [phone]   Audie Pinto 01/19/2019, 2:55 PM

## 2019-01-19 NOTE — Progress Notes (Signed)
  Echocardiogram Echocardiogram Transesophageal has been performed.  Paul [name] 01/19/2019, 3:03 PM

## 2019-01-19 NOTE — Progress Notes (Signed)
Occupational Therapy Treatment Patient Details Name: Paul [name] MRN: [id number] DOB: 1966/03/19 Today's Date: 01/19/2019    History of present illness Patient is a 53 y/o male presenting to the ED on 01/17/2019 with primary complaints of right sided weakness. CT head-negative, his MRI demonstrates Multiple small foci of acute ischemia within both cerebral hemispheres, the greatest along the medial aspect of the left precentral gyrus. Past medical history significant of recurrent pancreatitis, chronic left facial droop 2/2 Bell's Palsy, HFpEF, and hypertension.   OT comments  Pt presented with HEP with theraputty and fine motor coordination tasks for RUE. Pt exhibiting poor to fair RUE fine motor coordination. Pt requires continued OT as pt still believes that his hand is [facility], but unable to properly write or grasp/pinch small objects without automatically assisting with LUE. OT following acutely.    Follow Up Recommendations  No OT follow up    Equipment Recommendations  None recommended by OT    Recommendations for Other Services      Precautions / Restrictions Precautions Precautions: Fall Restrictions Weight Bearing Restrictions: No       Mobility Bed Mobility Overal bed mobility: Modified Independent                Transfers Overall transfer level: Needs assistance Equipment used: None Transfers: Sit to/from Stand;Stand Pivot Transfers Sit to Stand: Supervision Stand pivot transfers: Supervision       General transfer comment: for safety    Balance Overall balance assessment: Modified Independent                               Standardized Balance Assessment Standardized Balance Assessment : Dynamic Gait Index   Dynamic Gait Index Level Surface: Normal Change in Gait Speed: Mild Impairment Gait with Horizontal Head Turns: Mild Impairment Gait with Vertical Head Turns: Mild Impairment Gait and Pivot Turn: Mild Impairment Step Over Obstacle:  Mild Impairment Step Around Obstacles: Normal Steps: Normal Total Score: 19     ADL either performed or assessed with clinical judgement   ADL Overall ADL's : At baseline                                       General ADL Comments: Pt's RUE fine motor coordination impaired, thus IADLs of writing and using [place] amy be hindered, but for BADL, pt continues to be independent.     Vision Baseline Vision/History: No visual deficits Vision Assessment?: Yes Eye Alignment: Within Functional Limits Ocular Range of Motion: Within Functional Limits Alignment/Gaze Preference: Within Defined Limits Tracking/Visual Pursuits: Able to track stimulus in all quads without difficulty Saccades: Within functional limits Convergence: Within functional limits Additional Comments: L visual field deficit from lateral middle peripheral vision   Perception Perception Perception Tested?: Yes Spatial deficits: proprioception, WFL   Praxis      Cognition Arousal/Alertness: Awake/alert Behavior During Therapy: WFL for tasks assessed/performed Overall Cognitive Status: Within Functional Limits for tasks assessed                                          Exercises     Shoulder Instructions       General Comments Education for [facility] and grip/pinch strength provided with handouts and theraputty.    Pertinent  Vitals/ Pain       Pain Assessment: No/denies pain  Home Living Family/patient expects to be discharged to:: Private residence Living Arrangements: Alone   Type of Home: House Home Access: Stairs to enter     Home Layout: One level     Bathroom Shower/Tub: Teacher, early years/pre: Standard     Home Equipment: None      Lives With: Alone    Prior Functioning/Environment Level of Independence: Independent        Comments: works in Tax inspector 2X/week        Progress Toward Goals  OT Goals(current goals can now  be found in the care plan section)  Progress towards OT goals: Progressing toward goals  Acute Rehab OT Goals Patient Stated Goal: to go home OT Goal Formulation: With patient Time For Goal Achievement: 02/02/19 Potential to Achieve Goals: Good ADL Goals Pt/caregiver will Perform Home Exercise Program: Right Upper extremity;With theraputty;Independently;With written HEP provided Additional ADL Goal #1: Pt will complete fine motor coordination exercises with RUE with 75% accuracy in 3/3 trials  Plan Discharge plan remains appropriate    Co-evaluation                 AM-PAC OT "6 Clicks" Daily Activity     Outcome Measure   Help from another person eating meals?: None Help from another person taking care of personal grooming?: None Help from another person toileting, which includes using toliet, bedpan, or urinal?: None Help from another person bathing (including washing, rinsing, drying)?: None Help from another person to put on and taking off regular upper body clothing?: None Help from another person to put on and taking off regular lower body clothing?: None 6 Click Score: 24    End of Session Equipment Utilized During Treatment: Gait belt  OT Visit Diagnosis: Unsteadiness on feet (R26.81);Other symptoms and signs involving the nervous system (R29.898)   Activity Tolerance Patient tolerated treatment well   Patient Left in bed;with call bell/phone within reach   Nurse Communication Mobility status        Time: 1540-1555 OT Time Calculation (min): 15 min  Charges: OT General Charges $OT Visit: 1 Visit OT Evaluation $OT Eval Moderate Complexity: 1 Mod OT Treatments $Therapeutic Exercise: 8-22 mins  Ebony Hail Harold Hedge) Marsa Aris OTR/L Acute Rehabilitation Services Pager: [phone] Office: [phone]    Audie Pinto 01/19/2019, 4:39 PM

## 2019-01-19 NOTE — CV Procedure (Signed)
    TRANSESOPHAGEAL ECHOCARDIOGRAM   NAME:  Paul [name]   MRN: [id number] DOB:  08-Aug-1965   ADMIT DATE: 01/17/2019  INDICATIONS:   PROCEDURE:   Informed consent was obtained prior to the procedure. The risks, benefits and alternatives for the procedure were discussed and the patient comprehended these risks.  Risks include, but are not limited to, cough, sore throat, vomiting, nausea, somnolence, esophageal and stomach trauma or perforation, bleeding, low blood pressure, aspiration, pneumonia, infection, trauma to the teeth and death.    Procedural time out performed. The oropharynx was anesthetized with topical 1% lidocaine.    During this procedure the patient is administered a total of diphenhydramine 25 mg, Versed 8 mg and Fentanyl 100 mcg to achieve and maintain moderate conscious sedation.  The patient's heart rate, blood pressure, and oxygen saturation are monitored continuously during the procedure. The period of conscious sedation is 27 minutes, of which I was present face-to-face 100% of this time.   The transesophageal probe was inserted in the esophagus and stomach without difficulty and multiple views were obtained.    COMPLICATIONS:    There were no immediate complications.  FINDINGS:  LEFT VENTRICLE: EF = 60-65% No regional wall motion abnormalities.  RIGHT VENTRICLE: Normal size and function.   LEFT ATRIUM: No thrombus/mass.  LEFT ATRIAL APPENDAGE: No thrombus/mass.   RIGHT ATRIUM: No thrombus/mass.  AORTIC VALVE:  Trileaflet. No regurgitation. No vegetation.  MITRAL VALVE:    Normal structure. Trivial regurgitation. No vegetation.  TRICUSPID VALVE: Normal structure. Trivial regurgitation. No vegetation.  PULMONIC VALVE: Grossly normal structure. No regurgitation. No apparent vegetation.  INTERATRIAL SEPTUM: No PFO or ASD seen by color Doppler.  PERICARDIUM: No effusion noted.  DESCENDING AORTA: Mild diffuse plaque seen   CONCLUSION: Normal EF, no  valve disease. No ASD/PFO. Negative bubble study. No source of intracardiac embolism.  Buford Dresser, MD, PhD [facility]  [address] [place],  [address] [phone]   2:12 PM

## 2019-01-19 NOTE — Consult Note (Addendum)
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 clean and dry for 3 days).  Wound check Kaylean Tupou be scheduled and entered in AVS if decide to proceed with loop.   Please call with questions.  Graciella Freer, PA-C 01/19/2019 9:47 AM  I have seen and examined this patient with Otilio Saber.  Agree with above, note added to reflect my findings.  On exam, RRR, no murmurs, lungs clear. Patient s/p CVA. No cause found. Discussed LINQ monitor but patient without insurance. Malick Netz thus plan for 30 day monitor to see if AF is present. If not positive, Mayara Paulson possibly implant an LINQ at a later date but he would like to think about this further.    Nyjae Hodge M. Bray Vickerman MD 01/19/2019 10:59 AM
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
## 2019-01-19 NOTE — Progress Notes (Addendum)
   Subjective:  Paul [name] is a 53 y.o. M with PMH of HFpEF, CKD2, and recurrent pancreatitis admit for arm weakness on hospital day 1  Mr.Rister was examined and evaluated at bedside this AM. He states he feels well but has not noted any improvement in his right arm weakness. He is also continuing to endorse his chronic abdominal pain. He mentions that he is concerned about his lack of coverage and difficulty in affording his medications. Discussed option of following with [facility] clinic and receiving medications at more affordable costs from hospital pharmacy. He expressed interest in following with the clinic.  Objective:  Vital signs in last 24 hours: Vitals:   01/18/19 1549 01/18/19 2003 01/19/19 0011 01/19/19 0431  BP: (!) 151/84 139/83 (!) 167/98 (!) 165/102  Pulse: 64 72 67 63  Resp: 17 17 19 20   Temp: 97.7 F (36.5 C) 98.1 F (36.7 C) 98.4 F (36.9 C) (!) 97.4 F (36.3 C)  TempSrc: Oral Oral Oral Oral  SpO2: 100% 97% 98% 100%  Weight:      Height:       Physical Exam  Constitutional: He is oriented to person, place, and time and well-developed, well-nourished, and in no distress.  Cardiovascular: Normal rate, regular rhythm, normal heart sounds and intact distal pulses.  No murmur heard. Musculoskeletal: Normal range of motion.        General: No tenderness or edema.  Neurological: He is alert and oriented to person, place, and time.  4/5 Rue strength, 5/5 LUE strength  Skin: Skin is warm and dry.   Assessment/Plan:  Principal Problem:   Acute CVA (cerebrovascular accident) [facility]) Active Problems:   Essential hypertension   Dyslipidemia   Tobacco abuse   Thrombocytosis ([place])  Mr.Kaien Lasecki is a 53 y.o.Mwith PMH ofHFpEF, CKD2, and recurrent pancreatitisadmit for multifocal CVA. So far work-up has been negative for possible source of PFO about venous clot being source of his stroke. Pending TEE. Will do further work-up for venous origin as possibility for false  negative results.  Right Arm Weakness 2/2 Cryptogenic stroke TTE: EF 60-65, no atrial level shunt. Carotid doppler: No obstructive stenosis in vertebral, carotid and subclavian arteries. Transcranial doppler: Normal flow velocities in circulation. Continuing to endorse right arm weakness. PT recommend outpatient PT. No arrhythmias noted on telemetry. - Appreciate neurology recs: loop recorder vs monitor, c/w DAPT for 3 weeks, d/c after TEE - F/u TEE - CT venogram Abd/Pelvis, Lower extremity US - Telemetry - F/u hypercoagulable panel - still pending - C/w plavix, aspirin, statin  Reactive vs Essential Thrombocytosis With findings of clotting event with stroke, started work-up for primary thrombocytosis. Platelet count 759 this am. Hemoglobin 10.5  - Save smear - F/u Jak2, Calreticulin - Trend cbc  DVT prophx: Lovenox Diet: Npo til TEE Code: DNR  Dispo: Anticipated discharge in approximately 0-1 day(s).   Mosetta Anis, MD 01/19/2019, 7:36 AM Pager: [phone]

## 2019-01-20 ENCOUNTER — Telehealth: Payer: Self-pay | Admitting: General Practice

## 2019-01-20 ENCOUNTER — Encounter (HOSPITAL_COMMUNITY): Payer: Self-pay | Admitting: Cardiology

## 2019-01-20 ENCOUNTER — Inpatient Hospital Stay (HOSPITAL_COMMUNITY): Payer: Self-pay

## 2019-01-20 DIAGNOSIS — Z7902 Long term (current) use of antithrombotics/antiplatelets: Secondary | ICD-10-CM

## 2019-01-20 DIAGNOSIS — Z7982 Long term (current) use of aspirin: Secondary | ICD-10-CM

## 2019-01-20 DIAGNOSIS — K861 Other chronic pancreatitis: Secondary | ICD-10-CM

## 2019-01-20 DIAGNOSIS — I639 Cerebral infarction, unspecified: Secondary | ICD-10-CM

## 2019-01-20 LAB — CBC
HCT: 31.6 % — ABNORMAL LOW (ref 39.0–52.0)
Hemoglobin: 9.9 g/dL — ABNORMAL LOW (ref 13.0–17.0)
MCH: 28.8 pg (ref 26.0–34.0)
MCHC: 31.3 g/dL (ref 30.0–36.0)
MCV: 91.9 fL (ref 80.0–100.0)
Platelets: 853 10*3/uL — ABNORMAL HIGH (ref 150–400)
RBC: 3.44 MIL/uL — ABNORMAL LOW (ref 4.22–5.81)
RDW: 15.3 % (ref 11.5–15.5)
WBC: 7.7 10*3/uL (ref 4.0–10.5)
nRBC: 0 % (ref 0.0–0.2)

## 2019-01-20 LAB — BASIC METABOLIC PANEL
Anion gap: 10 (ref 5–15)
BUN: 11 mg/dL (ref 6–20)
CO2: 31 mmol/L (ref 22–32)
Calcium: 9.2 mg/dL (ref 8.9–10.3)
Chloride: 101 mmol/L (ref 98–111)
Creatinine, Ser: 0.95 mg/dL (ref 0.61–1.24)
GFR calc Af Amer: >60 mL/min (ref >60)
GFR calc non Af Amer: >60 mL/min (ref >60)
Glucose, Bld: 111 mg/dL — ABNORMAL HIGH (ref 70–99)
Potassium: 4.4 mmol/L (ref 3.5–5.1)
Sodium: 142 mmol/L (ref 135–145)

## 2019-01-20 MED ORDER — CLOPIDOGREL BISULFATE 75 MG PO TABS: 75.0000 mg | ORAL_TABLET | Freq: Every day | ORAL | 0 refills | Status: DC

## 2019-01-20 MED ORDER — ROSUVASTATIN CALCIUM 20 MG PO TABS: 20.0000 mg | ORAL_TABLET | Freq: Every day | ORAL | 0 refills | Status: DC

## 2019-01-20 MED ORDER — ASPIRIN 81 MG PO TBEC: 81.0000 mg | DELAYED_RELEASE_TABLET | Freq: Every day | ORAL | 0 refills | Status: DC

## 2019-01-20 MED ORDER — CARVEDILOL 6.25 MG PO TABS: 6.2500 mg | ORAL_TABLET | Freq: Two times a day (BID) | ORAL | 3 refills | Status: DC

## 2019-01-20 NOTE — Progress Notes (Signed)
Patient does not want to were telemetry monitor.

## 2019-01-20 NOTE — Plan of Care (Signed)
Progressing towards goals

## 2019-01-20 NOTE — Discharge Summary (Signed)
Name: Paul [name] MRN: [id number] DOB: 03/03/1966 53 y.o. PCP: Patient, No Pcp Per  Date of Admission: 01/17/2019  4:43 PM Date of Discharge: 01/20/2019 Attending Physician: Axel Filler, *  Discharge Diagnosis: 1. Cryptogenic Multifocal Stroke 2. Thrombocytosis 3. Recurrent Pancreatitis  Discharge Medications: Allergies as of 01/20/2019   No Known Allergies     Medication List    STOP taking these medications   HYDROcodone-acetaminophen 5-325 MG tablet Commonly known as: NORCO/VICODIN   ondansetron 4 MG tablet Commonly known as: ZOFRAN   polyethylene glycol 17 g packet Commonly known as: MIRALAX / GLYCOLAX   senna-docusate 8.6-50 MG tablet Commonly known as: Senokot-S     TAKE these medications   amLODipine 10 MG tablet Commonly known as: NORVASC Take 1 tablet (10 mg total) by mouth daily for 30 days.   aspirin 81 MG EC tablet Take 1 tablet (81 mg total) by mouth daily.   carvedilol 6.25 MG tablet Commonly known as: COREG Take 1 tablet (6.25 mg total) by mouth 2 (two) times daily with a meal.   clopidogrel 75 MG tablet Commonly known as: PLAVIX Take 1 tablet (75 mg total) by mouth daily.   multivitamin with minerals Tabs tablet Take 1 tablet by mouth daily.   pantoprazole 40 MG tablet Commonly known as: Protonix Take 1 tablet (40 mg total) by mouth daily.   rosuvastatin 20 MG tablet Commonly known as: CRESTOR Take 1 tablet (20 mg total) by mouth daily at 6 PM.      Disposition and follow-up:   Paul [name] was discharged from [facility] in Good condition.  At the hospital follow up visit please address:  1. Cryptogenic Multifocal Stroke - Please follow up pending hypercoagulable labs noted below - Ensure he follows up with cardiology for event monitor - Ensure he continues DAPT w/ asa and plavix for 3 weeks and then asa only - Confirm that he is attending outpatient physical therapy - Continue to encourage smoking  cessation  2. Thrombocytosis - Please follow up pending essential thrombocytosis labs - Please follow up ferritin for possible reactive thrombocytosis from iron deficiency anemia - If essential thrombocytosis, consider consult to heme/onc for management - If reactive, please check cbc to assess for resolution if reactive  3. Recurrent Pancreatitis - Please continue to encourage cessation from alcohol use  2.  Labs / imaging needed at time of follow-up: CBC, Ferritin if not completed in hospital  3.  Pending labs/ test needing follow-up: Ferritin, JAK2, Calreticulin, Prothrombin Gene, Factor 5 Leiden  Follow-up Appointments: Follow-up Information    [facility].   Specialty: Rehabilitation Why: They will contact you for the first visit. Contact information: [address] [place] [id number] mc [place] [address] [place]. Go on 01/27/2019.   Why: 10:15 AM Contact information: [address] [place]. Call.   Why: 6 weeks Contact information: [place], [place] Hospital Course by problem list: 1. Cryptogenic Multifocal Stroke: Paul [name] is a 53 yo M w/ PMH of recurrent pancreatitis presenting to [facility] with complaints of right upper extremity weakness. CT head showed old lacunar infarct but no bleeding or mass. MR Brain was performed showing multiple small foci of acute ischemia in bilateral hemispheres. Echocardiogram was performed  showing no obvious evidence of interatrial shunt. Transcranial doppler was performed showing no evidence of abnormal flow velocities in cerebral circulation. TEE was performed with no evidence of left atrial appendage thrombus or PFO. Hypercoagulable labs were drawn with no positive results so far. CT  venogram as well as lower extremity dopplers were performed with no discovery of clots. Discharged with recommendation to continue DAPT for 3 weeks with statin and 30 day event monitor.  2. Thrombocytosis: On admission found to have elevated platelet count at 730. Chart review showed prior episodes of intermittent thrombocytosis. Intially thought to be reactive, in the setting of a thromboembolic event, work-up for essential thrombocytosis was started with Jak2 and Calretinin. Blood smear was observed under the microscope and no obvious evidence of splenic dysfunction was found. CT Abd/pelvis showed stable low-density structure on the posteromedial aspect of the spleen that was unchanged from piror imaging. Discharged at platelet count of 853 with recommendation to follow up with PCP.  3. Recurrent Pancreatitis: During hospitalization, complained of mild abdominal discomfort similar to recent prior hospitalization for pancreatitis. CT abd/pelvis demonstrated prior fluid collection organized into complex collection in left hemipelvic region. Discharged with recommendation to f/u with outpatient GI and continue alcohol cessation.  Discharge Vitals:   BP (!) 168/90 (BP Location: Right Arm)    Pulse 60    Temp 98.4 F (36.9 C) (Oral)    Resp 18    Ht 6' (1.829 m)    Wt 77.1 kg    SpO2 100%    BMI 23.06 kg/m   Pertinent Labs, Studies, and Procedures:  CBC Latest Ref Rng & Units 01/20/2019 01/19/2019 01/17/2019  WBC 4.0 - 10.5 K/uL 7.7 8.8 -  Hemoglobin 13.0 - 17.0 g/dL 9.9(L) 10.5(L) 10.9(L)  Hematocrit 39.0 - 52.0 % 31.6(L) 32.0(L) 32.0(L)  Platelets 150 - 400 K/uL 853(H) 759(H) -   TRANSESOPHOGEAL ECHO IMPRESSIONS  1. The left ventricle has normal systolic function, with an ejection fraction of 60-65%. The cavity size was normal. No evidence of left ventricular regional wall motion abnormalities.  2. The right ventricle has normal systolc function. The cavity was normal.  3. No evidence of a  thrombus present in the left atrial appendage.  4. No evidence of mitral valve stenosis.  5. No trisuspid valve vegetation visualized.  6. No vegetation on the aortic valve.  7. The aortic valve is tricuspid.  8. There is evidence of mild plaque in the descending aorta.  CT VENOGRAM ABD-PELVIS IMPRESSION: VASCULAR 1. Atherosclerotic disease in the aorta and iliac arteries without aneurysm. Atherosclerotic disease in the bilateral upper thighs without severe stenosis or occlusion. 2. Limited evaluation of the venous structures due to the timing of the study. However, there is no gross abnormality to the venous structures.  NON-VASCULAR 1. Fluid along the left side of the abdomen has organized into complex fluid collections extending from the spleen down to the left hemipelvic region. There is no gas within these collections. Based on the history of previous pancreatic inflammation, collections likely represent pseudocysts. Abscesses cannot be excluded but thought to be less likely. Fluid would be amendable to image guided aspiration if needed. 2. Exophytic hyperdense structures involving both kidneys likely represent hemorrhagic or proteinaceous cysts based on previous MRI. 3. Again noted is a hypodensity along the distal pancreatic body region and minimal change from the previous exam examination. Suspect this low density structure is either inflammatory or related to ductal ectasia. 4. Stable structure with peripheral calcifications adjacent to the  spleen. 5. Unusual configuration of the bowel and central mesentery in the mid abdomen as described. Findings are similar to the prior examination and no definite inflammatory changes in this area.  Lower Venous Study Summary: Right: There is no evidence of deep vein thrombosis in the lower extremity. No cystic structure found in the popliteal fossa. Left: There is no evidence of deep vein thrombosis in the lower extremity. No  cystic structure found in the popliteal fossa.  CT HEAD WITHOUT CONTRAST IMPRESSION: 1. No acute intracranial abnormalities. 2. Old lacunar infarct in the right external capsule. 3. Small mucosal retention cyst or polyp in the posterior aspect of the right maxillary sinus incidentally noted.  MRI HEAD/CERVICAL SPINE WITHOUT CONTRAST IMPRESSION: 1. Multiple small foci of acute ischemia within both cerebral hemispheres, the greatest along the medial aspect of the left precentral gyrus. This location is in keeping with reported right-sided symptoms. 2. The distribution within multiple vascular territories may indicate a central cardiac or aortic embolic process. 3. No cervical spinal cord abnormality. 4. Moderate left neural foraminal stenosis at C5-6 and C6-7.  Discharge Instructions: Discharge Instructions    Ambulatory referral to Physical Therapy   Complete by: As directed    Call MD for:  difficulty breathing, headache or visual disturbances   Complete by: As directed    Call MD for:  extreme fatigue   Complete by: As directed    Call MD for:  persistant dizziness or light-headedness   Complete by: As directed    Call MD for:  persistant nausea and vomiting   Complete by: As directed    Diet - low sodium heart healthy   Complete by: As directed    Discharge instructions   Complete by: As directed    Dear Rodell Perna  You came to [place] with arm weakness. We have determined this was caused by a stroke. Here are our recommendations for you at discharge:  Take aspirin 81mg  everyday Take clopidogrel (Plavix) every day for 3 weeks Take rosuvastatin 20mg  every day Make sure to follow up with neurology for event monitor Take your other home medications as prescribed Please make sure to stop smoking at home.  Thank you for choosing [facility].   Increase activity slowly   Complete by: As directed      Signed: Mosetta Anis, MD 01/20/2019, 1:56 PM   Pager: [phone]

## 2019-01-20 NOTE — Progress Notes (Signed)
Physical Therapy Treatment Patient Details Name: Paul [name] MRN: [id number] DOB: 1965/10/28 Today's Date: 01/20/2019    History of Present Illness Patient is a 53 y/o male presenting to the ED on 01/17/2019 with primary complaints of right sided weakness. CT head-negative, his MRI demonstrates Multiple small foci of acute ischemia within both cerebral hemispheres, the greatest along the medial aspect of the left precentral gyrus. Past medical history significant of recurrent pancreatitis, chronic left facial droop 2/2 Bell's Palsy, HFpEF, and hypertension.    PT Comments    Patient is making good progress with PT.  From a mobility standpoint anticipate patient will be ready for DC home when medically ready.    Follow Up Recommendations  Outpatient PT;Supervision - Intermittent     Equipment Recommendations  None recommended by PT    Recommendations for Other Services       Precautions / Restrictions Precautions Precautions: Fall Restrictions Weight Bearing Restrictions: No    Mobility  Bed Mobility Overal bed mobility: Independent                Transfers Overall transfer level: Independent Equipment used: None                Ambulation/Gait Ambulation/Gait assistance: Modified independent (Device/Increase time) Gait Distance (Feet): 300 Feet Assistive device: None Gait Pattern/deviations: Step-through pattern     General Gait Details: varying gait speeds demonstrated; no LOB    Stairs             Wheelchair Mobility    Modified Rankin (Stroke Patients Only)       Balance Overall balance assessment: Modified Independent                           High level balance activites: Head turns High Level Balance Comments: pt without increased unsteadiness with horizontal head turns; pt unable to maintain bilat tandem or bilat single leg stance without single UE support             Cognition Arousal/Alertness:  Awake/alert Behavior During Therapy: Agitated Overall Cognitive Status: Within Functional Limits for tasks assessed                                 General Comments: pt is resistive to balance activities and repeating "no one does this in real life. I don't understand why I need to do this."      Exercises      General Comments        Pertinent Vitals/Pain Pain Assessment: No/denies pain    Home Living                      Prior Function            PT Goals (current goals can now be found in the care plan section) Acute Rehab PT Goals Patient Stated Goal: to go home Progress towards PT goals: Progressing toward goals    Frequency    Min 4X/week      PT Plan Current plan remains appropriate    Co-evaluation              AM-PAC PT "6 Clicks" Mobility   Outcome Measure  Help needed turning from your back to your side while in a flat bed without using bedrails?: None Help needed moving from lying on your back to sitting on the side of  a flat bed without using bedrails?: None Help needed moving to and from a bed to a chair (including a wheelchair)?: None Help needed standing up from a chair using your arms (e.g., wheelchair or bedside chair)?: None Help needed to walk in hospital room?: None Help needed climbing 3-5 steps with a railing? : A Little 6 Click Score: 23    End of Session   Activity Tolerance: Patient tolerated treatment well Patient left: in chair;with call bell/phone within reach Nurse Communication: Mobility status PT Visit Diagnosis: Unsteadiness on feet (R26.81);Other abnormalities of gait and mobility (R26.89);Muscle weakness (generalized) (M62.81)     Time: 4163-8453 PT Time Calculation (min) (ACUTE ONLY): 13 min  Charges:  $Gait Training: 8-22 mins                     Earney Navy, PTA Acute Rehabilitation Services Pager: [phone] Office: [phone]     Darliss Cheney 01/20/2019, 9:23  AM

## 2019-01-20 NOTE — Telephone Encounter (Signed)
Hosp f/u per Dr Truman Hayward; pt appt 07/02 1015am/NW

## 2019-01-20 NOTE — Progress Notes (Signed)
   Subjective:  Paul [name] is a 53 y.o. M with PMH of recurrent pancreatitis admit for right sided weakness on hospital day 2  Paul [name] was examined and evaluated at bedside chair this am. He states he feels well and actually noticed that his right arm strength returned overnight and is currently feeling back to baseline. He states he would feel prior episodes of numbness and weakness intermittently but overall appears his arm strength has significantly improved. Discussed plan for discharge today. Paul [name] expressed understanding.  Objective:  Vital signs in last 24 hours: Vitals:   01/19/19 2032 01/19/19 2322 01/20/19 0350 01/20/19 0728  BP: (!) 141/86 (!) 152/90 (!) 153/97 (!) 162/90  Pulse: 64 72 65 (!) 56  Resp: 18 18 18 18   Temp: 98 F (36.7 C) 98.2 F (36.8 C) 98.1 F (36.7 C) 98.3 F (36.8 C)  TempSrc: Oral Oral Oral Oral  SpO2: 98% 97% 99% 100%  Weight:      Height:       Gen: Well-developed, well nourished, NAD Extm: ROM intact, Peripheral pulses intact, No peripheral edema Skin: Dry, Warm, normal turgor, no rashes, lesions, wounds.  Neuro: AAOx3, Strenght: RUE 5/5, LUE 5/5, Good gait Psych: Normal mood and affect  Assessment/Plan:  Principal Problem:   Acute CVA (cerebrovascular accident) [facility]) Active Problems:   Essential hypertension   Dyslipidemia   Tobacco abuse   Thrombocytosis ([place])  Paul [name] is a 53 y.o.Mwith PMH ofHFpEF, CKD2, and recurrent pancreatitisadmit for multifocal CVA. So far work-up has been negative for possible source of PFO about venous clot being source of his stroke. Pending TEE. Will do further work-up for venous origin as possibility for false negative results.  Right Arm Weakness 2/2 Cryptogenic stroke TEE: No appendage thrombus, No PFO, CT venogram Abd/Pelvis w/ no significant venous clot altho poor study. Lower extremity US prelim read no DVT. No arrhythmia on telemetry noted. Homocysteine, Lupus, Protein S, Protein C,  Antithrombin III, Cardiolipin antibodies, RPR negative - C/w plavix, aspirin, statin - Discharge home today with outpatient PT  Reactive vs Essential Thrombocytosis Blood smear personally reviewed with thrombocytosis w/o obvious evidence of splenic dysfunction Platelet count 853 this am. Hemoglobin 9.9  - Ferritin - Jak2, Calreticulin pending - F/u outpatient  DVT prophx: Lovenox Diet: Cardiac Code: DNR  Dispo: Anticipated discharge in approximately today.   Mosetta Anis, MD 01/20/2019, 10:55 AM Pager: [phone]

## 2019-01-20 NOTE — Progress Notes (Signed)
Occupational Therapy Treatment Patient Details Name: Paul [name] MRN: [id number] DOB: 09-Oct-1965 Today's Date: 01/20/2019    History of present illness Patient is a 53 y/o male presenting to the ED on 01/17/2019 with primary complaints of right sided weakness. CT head-negative, his MRI demonstrates Multiple small foci of acute ischemia within both cerebral hemispheres, the greatest along the medial aspect of the left precentral gyrus. Past medical history significant of recurrent pancreatitis, chronic left facial droop 2/2 Bell's Palsy, HFpEF, and hypertension.   OT comments  Pt education on [facility] exercises and theraputty provided. Pt wanting to perform them prior to discharge for accuracy. Pt's RUE handwriting requires assist for ADL and fine motor coordination exercises. pt fluent with HEP with theraputty exercise at this time. Pt turning head to L for any visual deficits in peripheral vision. No follow-up OT needs. OT signing off.   Follow Up Recommendations  No OT follow up    Equipment Recommendations  None recommended by OT    Recommendations for Other Services      Precautions / Restrictions Precautions Precautions: Fall Restrictions Weight Bearing Restrictions: No       Mobility Bed Mobility Overal bed mobility: Independent                Transfers Overall transfer level: Independent                    Balance Overall balance assessment: Modified Independent                                         ADL either performed or assessed with clinical judgement   ADL Overall ADL's : At baseline                                       General ADL Comments: Pt's RUE handwriting requires assist for ADL and fine motor coordination exercises. pt fluent with HEP with theraputty exercises.     Vision   Vision Assessment?: Yes Additional Comments: L visual field deficits- pt education on compensation to be able to manage visual  field deficits   Perception     Praxis      Cognition Arousal/Alertness: Awake/alert Behavior During Therapy: WFL for tasks assessed/performed Overall Cognitive Status: Within Functional Limits for tasks assessed                                 General Comments: pt agreeable to theraputty exercises        Exercises Exercises: Other exercises Other Exercises Other Exercises: fine motor coordination and theraputty   Shoulder Instructions       General Comments Education to continue HEP 3x daily with theraputty and practice handwriting    Pertinent Vitals/ Pain       Pain Assessment: No/denies pain  Home Living                                          Prior Functioning/Environment              Frequency  Min 2X/week        Progress Toward Goals  OT Goals(current  goals can now be found in the care plan section)  Progress towards OT goals: Progressing toward goals  Acute Rehab OT Goals Patient Stated Goal: to go home OT Goal Formulation: With patient Time For Goal Achievement: 02/02/19 Potential to Achieve Goals: Good ADL Goals Pt/caregiver will Perform Home Exercise Program: Right Upper extremity;With theraputty;Independently;With written HEP provided Additional ADL Goal #1: Pt will complete fine motor coordination exercises with RUE with 75% accuracy in 3/3 trials  Plan Discharge plan remains appropriate    Co-evaluation                 AM-PAC OT "6 Clicks" Daily Activity     Outcome Measure   Help from another person eating meals?: None Help from another person taking care of personal grooming?: None Help from another person toileting, which includes using toliet, bedpan, or urinal?: None Help from another person bathing (including washing, rinsing, drying)?: None Help from another person to put on and taking off regular upper body clothing?: None Help from another person to put on and taking off regular lower  body clothing?: None 6 Click Score: 24    End of Session    OT Visit Diagnosis: Unsteadiness on feet (R26.81);Other symptoms and signs involving the nervous system (R29.898)   Activity Tolerance Patient tolerated treatment well   Patient Left in chair;with call bell/phone within reach   Nurse Communication Mobility status        Time: 4709-6283 OT Time Calculation (min): 10 min  Charges: OT General Charges $OT Visit: 1 Visit OT Treatments $Therapeutic Exercise: 8-22 mins  Darryl Nestle) Marsa Aris OTR/L Acute Rehabilitation Services Pager: [phone] Office: [phone]   Audie Pinto 01/20/2019, 3:49 PM

## 2019-01-20 NOTE — Progress Notes (Signed)
CSW acknowledging consult for alcohol use. CSW met with patient, and patient became irritated during discussion because he hasn't had anything to drink in months and doesn't feel like he has a problem with drinking anymore. Patient unsure why CSW was consulted for alcohol use, says he doesn't need any help. Patient says that he is aware that if he does drink that he will damage his health and he wants to get healthy. Patient refusing resources at this time.  CSW signing off.  Laveda Abbe, [place] Clinical Social Worker [phone]

## 2019-01-20 NOTE — Progress Notes (Deleted)
OT Cancellation Note  Patient Details Name: Paul [name] MRN: [id number] DOB: 1966/06/06   Cancelled Treatment:    Reason Eval/Treat Not Completed: Other (comment)(Pt being discharged and with fair fine motor control.) Pt reports independence with ADL and HEP provided. OT signing off.  Ebony Hail Harold Hedge) Marsa Aris OTR/L Acute Rehabilitation Services Pager: [phone] Office: [place] 01/20/2019, 12:18 PM

## 2019-01-20 NOTE — TOC Transition Note (Signed)
Transition of Care [facility]) - CM/SW Discharge Note   Patient Details  Name: Paul [name] MRN: [id number] Date of Birth: [birth year]  [facility] of Care [facility]) CM/SW Contact:  Paul Friar, RN Phone Number: 01/20/2019, 11:53 AM   Clinical Narrative:    Paul [name] IM clinic is picking him up for PCP. They have sent his medications to [facility] outpatient pharmacy for assist.  Pt has transportation home.   Final next level of care: OP Rehab Barriers to Discharge: Inadequate or no insurance   Patient Goals and CMS Choice     Choice offered to / list presented to : Patient  Discharge Placement                       Discharge Plan and Services   Discharge Planning Services: CM Consult                                 Social Determinants of Health (SDOH) Interventions     Readmission Risk Interventions Readmission Risk Prevention Plan 11/10/2018  PCP or Specialist Appt within 5-7 Days Not Complete  Not Complete comments [facility] (this was the first availabale appt they had)  Some recent data might be hidden

## 2019-01-20 NOTE — Progress Notes (Signed)
Pt discharged at this time.  Pt has all belongings with him, including glasses, cell phone and charger.  He verbalizes understanding of all discharge instructions, including where to pick up prescriptions, and follow-up appointments.  He has no complaints at this time.  D/C via wheelchair.  Has transportation home.

## 2019-01-21 ENCOUNTER — Telehealth: Payer: Self-pay | Admitting: Radiology

## 2019-01-21 MED FILL — CARVEDILOL 6.25 MG TABLET: 6.25 | 30 days supply | Qty: 60 | Fill #0

## 2019-01-21 MED FILL — CLOPIDOGREL 75 MG TABLET: 75 | 21 days supply | Qty: 21 | Fill #0

## 2019-01-21 MED FILL — ROSUVASTATIN CALCIUM 20 MG: 20 | 30 days supply | Qty: 30 | Fill #0

## 2019-01-21 MED FILL — ASPIRIN LOW DOSE 81 MG TBEC: 81 | 90 days supply | Qty: 90 | Fill #0

## 2019-01-21 NOTE — Telephone Encounter (Signed)
Called to do TOC, vmail answered, lm for rtc

## 2019-01-21 NOTE — Telephone Encounter (Signed)
Tried calling to verify address and inform patient of self pay option for the [employer] event monitor that was ordered for him.

## 2019-01-24 ENCOUNTER — Telehealth: Payer: Self-pay | Admitting: *Deleted

## 2019-01-24 LAB — FACTOR 5 LEIDEN

## 2019-01-24 LAB — PROTHROMBIN GENE MUTATION

## 2019-01-24 NOTE — Telephone Encounter (Signed)
Not available, no DPR

## 2019-01-24 NOTE — Telephone Encounter (Signed)
01/24/19 Calling to verify information to enroll for 30 day cardiac event monitor, and give instructions.

## 2019-01-26 LAB — JAK2  V617F QUAL. WITH REFLEX TO EXON 12: Reflex:: 15

## 2019-01-26 LAB — CALRETICULIN (CALR) MUTATION ANALYSIS

## 2019-01-26 LAB — JAK2 EXONS 12-15

## 2019-01-26 NOTE — Telephone Encounter (Signed)
Attempted callback for toc, no answer

## 2019-01-27 ENCOUNTER — Encounter: Payer: Self-pay | Admitting: General Practice

## 2019-01-27 ENCOUNTER — Ambulatory Visit: Payer: Self-pay

## 2019-01-27 NOTE — Telephone Encounter (Signed)
Pt did not keep his appt this morning.

## 2019-02-03 ENCOUNTER — Telehealth: Payer: Self-pay | Admitting: *Deleted

## 2019-02-25 MED FILL — CARVEDILOL 6.25 MG TABLET: 6.25 | 30 days supply | Qty: 60 | Fill #1

## 2019-02-25 MED FILL — ROSUVASTATIN CALCIUM 20 MG: 20 | 30 days supply | Qty: 30 | Fill #1

## 2019-03-07 ENCOUNTER — Encounter: Payer: Self-pay | Admitting: Student

## 2019-06-08 MED FILL — CARVEDILOL 6.25 MG TABLET: 6.25 | 30 days supply | Qty: 60 | Fill #2

## 2019-09-17 ENCOUNTER — Emergency Department (HOSPITAL_COMMUNITY): Payer: Medicaid Other

## 2019-09-17 ENCOUNTER — Encounter (HOSPITAL_COMMUNITY): Payer: Self-pay | Admitting: Emergency Medicine

## 2019-09-17 ENCOUNTER — Inpatient Hospital Stay (HOSPITAL_COMMUNITY)
Admission: EM | Admit: 2019-09-17 | Discharge: 2019-09-24 | DRG: 064 | Disposition: A | Discharge status: Rehabilitation Facility | Payer: Medicaid Other | Attending: Neurology | Admitting: Neurology

## 2019-09-17 DIAGNOSIS — R29810 Facial weakness: Secondary | ICD-10-CM | POA: Diagnosis present

## 2019-09-17 DIAGNOSIS — E785 Hyperlipidemia, unspecified: Secondary | ICD-10-CM | POA: Diagnosis present

## 2019-09-17 DIAGNOSIS — G8194 Hemiplegia, unspecified affecting left nondominant side: Secondary | ICD-10-CM | POA: Diagnosis present

## 2019-09-17 DIAGNOSIS — R7303 Prediabetes: Secondary | ICD-10-CM | POA: Diagnosis present

## 2019-09-17 DIAGNOSIS — Z833 Family history of diabetes mellitus: Secondary | ICD-10-CM

## 2019-09-17 DIAGNOSIS — I5032 Chronic diastolic (congestive) heart failure: Secondary | ICD-10-CM | POA: Diagnosis present

## 2019-09-17 DIAGNOSIS — W07XXXA Fall from chair, initial encounter: Secondary | ICD-10-CM | POA: Diagnosis not present

## 2019-09-17 DIAGNOSIS — R1311 Dysphagia, oral phase: Secondary | ICD-10-CM | POA: Diagnosis present

## 2019-09-17 DIAGNOSIS — R001 Bradycardia, unspecified: Secondary | ICD-10-CM | POA: Diagnosis present

## 2019-09-17 DIAGNOSIS — I700 Atherosclerosis of aorta: Secondary | ICD-10-CM | POA: Diagnosis present

## 2019-09-17 DIAGNOSIS — I6522 Occlusion and stenosis of left carotid artery: Secondary | ICD-10-CM | POA: Diagnosis present

## 2019-09-17 DIAGNOSIS — H9192 Unspecified hearing loss, left ear: Secondary | ICD-10-CM | POA: Diagnosis present

## 2019-09-17 DIAGNOSIS — H53462 Homonymous bilateral field defects, left side: Secondary | ICD-10-CM | POA: Diagnosis present

## 2019-09-17 DIAGNOSIS — I615 Nontraumatic intracerebral hemorrhage, intraventricular: Principal | ICD-10-CM | POA: Diagnosis present

## 2019-09-17 DIAGNOSIS — Z79899 Other long term (current) drug therapy: Secondary | ICD-10-CM

## 2019-09-17 DIAGNOSIS — R471 Dysarthria and anarthria: Secondary | ICD-10-CM | POA: Diagnosis present

## 2019-09-17 DIAGNOSIS — R1313 Dysphagia, pharyngeal phase: Secondary | ICD-10-CM | POA: Diagnosis present

## 2019-09-17 DIAGNOSIS — I130 Hypertensive heart and chronic kidney disease with heart failure and stage 1 through stage 4 chronic kidney disease, or unspecified chronic kidney disease: Secondary | ICD-10-CM | POA: Diagnosis present

## 2019-09-17 DIAGNOSIS — Z8673 Personal history of transient ischemic attack (TIA), and cerebral infarction without residual deficits: Secondary | ICD-10-CM

## 2019-09-17 DIAGNOSIS — I160 Hypertensive urgency: Secondary | ICD-10-CM

## 2019-09-17 DIAGNOSIS — E7800 Pure hypercholesterolemia, unspecified: Secondary | ICD-10-CM | POA: Diagnosis present

## 2019-09-17 DIAGNOSIS — F1290 Cannabis use, unspecified, uncomplicated: Secondary | ICD-10-CM | POA: Diagnosis present

## 2019-09-17 DIAGNOSIS — K5900 Constipation, unspecified: Secondary | ICD-10-CM

## 2019-09-17 DIAGNOSIS — R29714 NIHSS score 14: Secondary | ICD-10-CM | POA: Diagnosis present

## 2019-09-17 DIAGNOSIS — Z6823 Body mass index (BMI) 23.0-23.9, adult: Secondary | ICD-10-CM

## 2019-09-17 DIAGNOSIS — R414 Neurologic neglect syndrome: Secondary | ICD-10-CM | POA: Diagnosis present

## 2019-09-17 DIAGNOSIS — I69154 Hemiplegia and hemiparesis following nontraumatic intracerebral hemorrhage affecting left non-dominant side: Secondary | ICD-10-CM | POA: Diagnosis not present

## 2019-09-17 DIAGNOSIS — Z20822 Contact with and (suspected) exposure to covid-19: Secondary | ICD-10-CM | POA: Diagnosis present

## 2019-09-17 DIAGNOSIS — Z7902 Long term (current) use of antithrombotics/antiplatelets: Secondary | ICD-10-CM

## 2019-09-17 DIAGNOSIS — N182 Chronic kidney disease, stage 2 (mild): Secondary | ICD-10-CM | POA: Diagnosis present

## 2019-09-17 DIAGNOSIS — I619 Nontraumatic intracerebral hemorrhage, unspecified: Secondary | ICD-10-CM | POA: Diagnosis not present

## 2019-09-17 DIAGNOSIS — Z9049 Acquired absence of other specified parts of digestive tract: Secondary | ICD-10-CM

## 2019-09-17 DIAGNOSIS — I161 Hypertensive emergency: Secondary | ICD-10-CM | POA: Diagnosis present

## 2019-09-17 DIAGNOSIS — F17210 Nicotine dependence, cigarettes, uncomplicated: Secondary | ICD-10-CM | POA: Diagnosis present

## 2019-09-17 DIAGNOSIS — G936 Cerebral edema: Secondary | ICD-10-CM

## 2019-09-17 DIAGNOSIS — Z801 Family history of malignant neoplasm of trachea, bronchus and lung: Secondary | ICD-10-CM

## 2019-09-17 DIAGNOSIS — Z7982 Long term (current) use of aspirin: Secondary | ICD-10-CM

## 2019-09-17 LAB — COMPREHENSIVE METABOLIC PANEL
ALT: 17 U/L (ref 0–44)
AST: 18 U/L (ref 15–41)
Albumin: 4.3 g/dL (ref 3.5–5.0)
Alkaline Phosphatase: 56 U/L (ref 38–126)
Anion gap: 11 (ref 5–15)
BUN: 16 mg/dL (ref 6–20)
CO2: 28 mmol/L (ref 22–32)
Calcium: 9.7 mg/dL (ref 8.9–10.3)
Chloride: 100 mmol/L (ref 98–111)
Creatinine, Ser: 0.99 mg/dL (ref 0.61–1.24)
GFR calc Af Amer: >60 mL/min (ref >60)
GFR calc non Af Amer: >60 mL/min (ref >60)
Glucose, Bld: 102 mg/dL — ABNORMAL HIGH (ref 70–99)
Potassium: 3.7 mmol/L (ref 3.5–5.1)
Sodium: 139 mmol/L (ref 135–145)
Total Bilirubin: 0.5 mg/dL (ref 0.3–1.2)
Total Protein: 8.4 g/dL — ABNORMAL HIGH (ref 6.5–8.1)

## 2019-09-17 LAB — I-STAT CHEM 8, ED
BUN: 17 mg/dL (ref 6–20)
Calcium, Ion: 1.24 mmol/L (ref 1.15–1.40)
Chloride: 99 mmol/L (ref 98–111)
Creatinine, Ser: 0.9 mg/dL (ref 0.61–1.24)
Glucose, Bld: 100 mg/dL — ABNORMAL HIGH (ref 70–99)
HCT: 46 % (ref 39.0–52.0)
Hemoglobin: 15.6 g/dL (ref 13.0–17.0)
Potassium: 3.7 mmol/L (ref 3.5–5.1)
Sodium: 139 mmol/L (ref 135–145)
TCO2: 30 mmol/L (ref 22–32)

## 2019-09-17 LAB — CBC
HCT: 45.4 % (ref 39.0–52.0)
Hemoglobin: 15.2 g/dL (ref 13.0–17.0)
MCH: 31 pg (ref 26.0–34.0)
MCHC: 33.5 g/dL (ref 30.0–36.0)
MCV: 92.7 fL (ref 80.0–100.0)
Platelets: 412 10*3/uL — ABNORMAL HIGH (ref 150–400)
RBC: 4.9 MIL/uL (ref 4.22–5.81)
RDW: 13.4 % (ref 11.5–15.5)
WBC: 7.4 10*3/uL (ref 4.0–10.5)
nRBC: 0 % (ref 0.0–0.2)

## 2019-09-17 LAB — RESPIRATORY PANEL BY RT PCR (FLU A&B, COVID)
Influenza A by PCR: NEGATIVE
Influenza B by PCR: NEGATIVE
SARS Coronavirus 2 by RT PCR: NEGATIVE

## 2019-09-17 LAB — TROPONIN I (HIGH SENSITIVITY)
Troponin I (High Sensitivity): 12 ng/L (ref <18)
Troponin I (High Sensitivity): 9 ng/L (ref <18)

## 2019-09-17 LAB — URINALYSIS, ROUTINE W REFLEX MICROSCOPIC
Bilirubin Urine: NEGATIVE
Glucose, UA: NEGATIVE mg/dL
Hgb urine dipstick: NEGATIVE
Ketones, ur: NEGATIVE mg/dL
Leukocytes,Ua: NEGATIVE
Nitrite: NEGATIVE
Protein, ur: NEGATIVE mg/dL
Specific Gravity, Urine: 1.017 (ref 1.005–1.030)
pH: 7 (ref 5.0–8.0)

## 2019-09-17 LAB — DIFFERENTIAL
Abs Immature Granulocytes: 0.01 10*3/uL (ref 0.00–0.07)
Basophils Absolute: 0.1 10*3/uL (ref 0.0–0.1)
Basophils Relative: 1 %
Eosinophils Absolute: 0.4 10*3/uL (ref 0.0–0.5)
Eosinophils Relative: 5 %
Immature Granulocytes: 0 %
Lymphocytes Relative: 37 %
Lymphs Abs: 2.7 10*3/uL (ref 0.7–4.0)
Monocytes Absolute: 0.8 10*3/uL (ref 0.1–1.0)
Monocytes Relative: 10 %
Neutro Abs: 3.5 10*3/uL (ref 1.7–7.7)
Neutrophils Relative %: 47 %

## 2019-09-17 LAB — PROTIME-INR
INR: 0.9 (ref 0.8–1.2)
Prothrombin Time: 12.2 seconds (ref 11.4–15.2)

## 2019-09-17 LAB — ETHANOL: Alcohol, Ethyl (B): <10 mg/dL (ref <10)

## 2019-09-17 LAB — RAPID URINE DRUG SCREEN, HOSP PERFORMED
Amphetamines: NOT DETECTED
Barbiturates: NOT DETECTED
Benzodiazepines: NOT DETECTED
Cocaine: NOT DETECTED
Opiates: NOT DETECTED
Tetrahydrocannabinol: POSITIVE — AB

## 2019-09-17 LAB — APTT: aPTT: 32 seconds (ref 24–36)

## 2019-09-17 LAB — MRSA PCR SCREENING: MRSA by PCR: NEGATIVE

## 2019-09-17 MED ORDER — LABETALOL HCL 5 MG/ML IV SOLN
20.0000 mg | Freq: Once | INTRAVENOUS | Status: AC
  Administered 2019-09-17: 20 mg via INTRAVENOUS

## 2019-09-17 MED ORDER — ACETAMINOPHEN 325 MG PO TABS
650.0000 mg | ORAL_TABLET | ORAL | Status: DC | PRN
  Administered 2019-09-18 – 2019-09-23 (×6): 650 mg via ORAL
  Filled 2019-09-17 (×7): qty 2

## 2019-09-17 MED ORDER — ORAL CARE MOUTH RINSE
15.0000 mL | Freq: Two times a day (BID) | OROMUCOSAL | Status: DC
  Administered 2019-09-18 – 2019-09-24 (×9): 15 mL via OROMUCOSAL

## 2019-09-17 MED ORDER — IOHEXOL 350 MG/ML SOLN
80.0000 mL | Freq: Once | INTRAVENOUS | Status: AC | PRN
  Administered 2019-09-17: 13:00:00 80 mL via INTRAVENOUS

## 2019-09-17 MED ORDER — PANTOPRAZOLE SODIUM 40 MG IV SOLR
40.0000 mg | Freq: Every day | INTRAVENOUS | Status: DC
  Administered 2019-09-17: 40 mg via INTRAVENOUS
  Filled 2019-09-17: qty 40

## 2019-09-17 MED ORDER — SENNOSIDES-DOCUSATE SODIUM 8.6-50 MG PO TABS
1.0000 | ORAL_TABLET | Freq: Two times a day (BID) | ORAL | Status: DC
  Administered 2019-09-18 – 2019-09-24 (×12): 1 via ORAL
  Filled 2019-09-17 (×13): qty 1

## 2019-09-17 MED ORDER — STROKE: EARLY STAGES OF RECOVERY BOOK
Freq: Once | Status: AC
  Filled 2019-09-17: qty 1

## 2019-09-17 MED ORDER — LABETALOL HCL 5 MG/ML IV SOLN
INTRAVENOUS | Status: AC
  Filled 2019-09-17: qty 4

## 2019-09-17 MED ORDER — CHLORHEXIDINE GLUCONATE 0.12 % MT SOLN
15.0000 mL | Freq: Two times a day (BID) | OROMUCOSAL | Status: DC
  Administered 2019-09-17 – 2019-09-24 (×13): 15 mL via OROMUCOSAL
  Filled 2019-09-17 (×11): qty 15

## 2019-09-17 MED ORDER — ACETAMINOPHEN 650 MG RE SUPP
650.0000 mg | RECTAL | Status: DC | PRN
  Administered 2019-09-17 (×2): 650 mg via RECTAL
  Filled 2019-09-17 (×3): qty 1

## 2019-09-17 MED ORDER — ACETAMINOPHEN 160 MG/5ML PO SOLN: 650.0000 mg | ORAL | Status: DC | PRN

## 2019-09-17 MED ORDER — CLEVIDIPINE BUTYRATE 0.5 MG/ML IV EMUL
0.0000 mg/h | INTRAVENOUS | Status: DC
  Administered 2019-09-17: 13:00:00 1 mg/h via INTRAVENOUS
  Administered 2019-09-17: 16:00:00 15 mg/h via INTRAVENOUS
  Administered 2019-09-17: 16 mg/h via INTRAVENOUS
  Administered 2019-09-17: 22:00:00 14 mg/h via INTRAVENOUS
  Administered 2019-09-18: 04:00:00 15 mg/h via INTRAVENOUS
  Administered 2019-09-18: 21:00:00 18 mg/h via INTRAVENOUS
  Administered 2019-09-18 (×2): 16 mg/h via INTRAVENOUS
  Administered 2019-09-18: 03:00:00 15 mg/h via INTRAVENOUS
  Administered 2019-09-18: 14 mg/h via INTRAVENOUS
  Administered 2019-09-18: 23:00:00 15 mg/h via INTRAVENOUS
  Administered 2019-09-18: 5 mg/h via INTRAVENOUS
  Administered 2019-09-18: 10 mg/h via INTRAVENOUS
  Administered 2019-09-18 (×2): 15 mg/h via INTRAVENOUS
  Administered 2019-09-19: 05:00:00 2 mg/h via INTRAVENOUS
  Administered 2019-09-19 (×2): 10 mg/h via INTRAVENOUS
  Administered 2019-09-20: 09:00:00 4 mg/h via INTRAVENOUS
  Administered 2019-09-20 – 2019-09-21 (×2): 8 mg/h via INTRAVENOUS
  Administered 2019-09-21: 9 mg/h via INTRAVENOUS
  Administered 2019-09-21 (×3): 8 mg/h via INTRAVENOUS
  Administered 2019-09-22: 2 mg/h via INTRAVENOUS
  Administered 2019-09-22: 04:00:00 9 mg/h via INTRAVENOUS
  Administered 2019-09-23: 06:00:00 3 mg/h via INTRAVENOUS
  Filled 2019-09-17: qty 50
  Filled 2019-09-17 (×2): qty 100
  Filled 2019-09-17 (×13): qty 50
  Filled 2019-09-17: qty 100
  Filled 2019-09-17: qty 50
  Filled 2019-09-17 (×2): qty 150
  Filled 2019-09-17: qty 100
  Filled 2019-09-17 (×4): qty 50

## 2019-09-17 MED ORDER — CHLORHEXIDINE GLUCONATE CLOTH 2 % EX PADS
6.0000 | MEDICATED_PAD | Freq: Every day | CUTANEOUS | Status: DC
  Administered 2019-09-17 – 2019-09-22 (×6): 6 via TOPICAL

## 2019-09-17 NOTE — ED Notes (Signed)
Code Stroke called.

## 2019-09-17 NOTE — ED Triage Notes (Signed)
Brought in by car, L sided weakness and numbness x30 minutes, LSN 1 hour ago.

## 2019-09-17 NOTE — Consult Note (Signed)
[facility] TeleNeurology Consult Services   Date of Service:   09/17/2019 12:37:52  Impression:     .  I61.8 -  intracerebral hemorrhage  Comments/Sign-Out: 60 M, h/o HTN, HL, embolic strokes in Jun [id number], here with acute onset L sided wkness/numbness, dysarthria, and forced gaze deviation to the R. Head CT shows a 10 cc R thalamic hemorrhage with IV extension.   PLAN  - fu CTA head/neck  - strict SBP<140  - stat neurosurg consult  - MRI brain w/o contrast when stable  - neuro to follow  --  Metrics: Last Known Well: 09/17/2019 11:30:00 [facility] Notification Time: 09/17/2019 12:37:52 Arrival Time: 09/17/2019 12:24:00 Stamp Time: 09/17/2019 12:37:52 Time First Login Attempt: 09/17/2019 12:40:40 Symptoms: left sided wkness/numbness NIHSS Start Assessment Time: 09/17/2019 12:48:18 Patient is not a candidate for Alteplase/Activase. Alteplase Medical Decision: 09/17/2019 12:46:23 Patient was not deemed candidate for Alteplase/Activase thrombolytics because of Current or Previous ICH.  CT head was reviewed.  Clinical Presentation is not Suggestive of Large Vessel Occlusive Disease  Sign Out:     .  Discussed with Emergency Department Provider  ------------------------------------------------------------------------------  History of Present Illness: Patient is a 54 year old Male.  Patient was brought by private transportation with symptoms of left sided wkness/numbness  26 M, h/o HTN, HL, multiple embolic strokes in Jun [id number], who was LKW at 11:30 AM today when he developed acute onset L sided wkness and numbness. NIHSS 14 for R gaze deviation, L arm and leg hemiparesis, L numbness and dysarthria.   Examination: BP(238/133), Pulse(71), Blood Glucose(100) 1A: Level of Consciousness - Alert; keenly responsive + 0 1B: Ask Month and Age - Both Questions Right + 0 1C: Blink Eyes & Squeeze Hands - Performs Both Tasks + 0 2: Test Horizontal  Extraocular Movements - Forced Gaze Palsy: Cannot Be Overcome + 2 3: Test Visual Fields - No Visual Loss + 0 4: Test Facial Palsy (Use Grimace if Obtunded) - Partial paralysis (lower face) + 2 5A: Test Left Arm Motor Drift - No Effort Against Gravity + 3 5B: Test Right Arm Motor Drift - No Drift for 10 Seconds + 0 6A: Test Left Leg Motor Drift - No Movement + 4 6B: Test Right Leg Motor Drift - No Drift for 5 Seconds + 0 7: Test Limb Ataxia (FNF/Heel-Shin) - No Ataxia + 0 8: Test Sensation - Complete Loss: Cannot Sense Being Touched At All + 2 9: Test Language/Aphasia - Normal; No aphasia + 0 10: Test Dysarthria - Mild-Moderate Dysarthria: Slurring but can be understood + 1 11: Test Extinction/Inattention - No abnormality + 0  NIHSS Score: 14  Pre-Morbid Modified Ranking Scale: 0 Points = No symptoms at all   Patient/Family was informed the Neurology Consult would occur via TeleHealth consult by way of interactive audio and video telecommunications and consented to receiving care in this manner.   Due to the immediate potential for life-threatening deterioration due to underlying acute neurologic illness, I spent 15 minutes providing critical care. This time includes time for face to face visit via telemedicine, review of medical records, imaging studies and discussion of findings with providers, the patient and/or family.   Dr Burtis Junes   [facility] [phone]  Case [id number]

## 2019-09-17 NOTE — Progress Notes (Signed)
Patient to 4N29 at 85, a/ox4 on arrival with complete neuro assessment as charted. Personal belongings inventory as charted, specific itemization in separate note. Patient's son [name] at bedside. SBP goal 140-160 per Leonel Ramsay, MD at bedside.  Candy Sledge, RN

## 2019-09-17 NOTE — Progress Notes (Signed)
Patient belongings from [facility]: clothing, phone, and wallet. Wallet, which contains $402 dollars, taken to security for safe keeping.

## 2019-09-17 NOTE — ED Notes (Addendum)
[facility] has been notified for transport to [facility]

## 2019-09-17 NOTE — Progress Notes (Signed)
EKG with concern on telemetry for ST segment changes, EKG with some nonspecific T wave changes, will trend troponins.  Patient is asymptomatic.  Roland Rack, MD [facility] [phone]  If 7pm- 7am, please page neurology on call as listed in [place].

## 2019-09-17 NOTE — ED Notes (Signed)
[facility] at bedside. Hedwig Morton, RN called and report given. Jennifer, pt's gf called, son will be visitng at [facility].
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
## 2019-09-18 ENCOUNTER — Inpatient Hospital Stay (HOSPITAL_COMMUNITY): Payer: Medicaid Other

## 2019-09-18 DIAGNOSIS — F17200 Nicotine dependence, unspecified, uncomplicated: Secondary | ICD-10-CM

## 2019-09-18 DIAGNOSIS — E7800 Pure hypercholesterolemia, unspecified: Secondary | ICD-10-CM

## 2019-09-18 DIAGNOSIS — I615 Nontraumatic intracerebral hemorrhage, intraventricular: Principal | ICD-10-CM

## 2019-09-18 DIAGNOSIS — I6389 Other cerebral infarction: Secondary | ICD-10-CM

## 2019-09-18 LAB — BASIC METABOLIC PANEL
Anion gap: 12 (ref 5–15)
BUN: 13 mg/dL (ref 6–20)
CO2: 26 mmol/L (ref 22–32)
Calcium: 9.6 mg/dL (ref 8.9–10.3)
Chloride: 101 mmol/L (ref 98–111)
Creatinine, Ser: 1.09 mg/dL (ref 0.61–1.24)
GFR calc Af Amer: >60 mL/min (ref >60)
GFR calc non Af Amer: >60 mL/min (ref >60)
Glucose, Bld: 120 mg/dL — ABNORMAL HIGH (ref 70–99)
Potassium: 3.8 mmol/L (ref 3.5–5.1)
Sodium: 139 mmol/L (ref 135–145)

## 2019-09-18 LAB — ECHOCARDIOGRAM COMPLETE
Height: 72 in
Weight: 2720 oz

## 2019-09-18 LAB — CBC
HCT: 43.9 % (ref 39.0–52.0)
Hemoglobin: 15.1 g/dL (ref 13.0–17.0)
MCH: 31.3 pg (ref 26.0–34.0)
MCHC: 34.4 g/dL (ref 30.0–36.0)
MCV: 90.9 fL (ref 80.0–100.0)
Platelets: 430 10*3/uL — ABNORMAL HIGH (ref 150–400)
RBC: 4.83 MIL/uL (ref 4.22–5.81)
RDW: 13.2 % (ref 11.5–15.5)
WBC: 7.2 10*3/uL (ref 4.0–10.5)
nRBC: 0 % (ref 0.0–0.2)

## 2019-09-18 MED ORDER — LOSARTAN POTASSIUM 50 MG PO TABS
50.0000 mg | ORAL_TABLET | Freq: Every day | ORAL | Status: DC
  Administered 2019-09-18 – 2019-09-19 (×2): 50 mg via ORAL
  Filled 2019-09-18: qty 1

## 2019-09-18 MED ORDER — BUTALBITAL-APAP-CAFFEINE 50-325-40 MG PO TABS
1.0000 | ORAL_TABLET | Freq: Three times a day (TID) | ORAL | Status: DC | PRN
  Administered 2019-09-18 – 2019-09-24 (×10): 1 via ORAL
  Filled 2019-09-18 (×10): qty 1

## 2019-09-18 MED ORDER — CARVEDILOL 3.125 MG PO TABS
6.2500 mg | ORAL_TABLET | Freq: Two times a day (BID) | ORAL | Status: DC
  Administered 2019-09-18 (×2): 6.25 mg via ORAL
  Filled 2019-09-18 (×3): qty 2

## 2019-09-18 MED ORDER — SODIUM CHLORIDE 0.9 % IV SOLN: INTRAVENOUS | Status: DC

## 2019-09-18 MED ORDER — PANTOPRAZOLE SODIUM 40 MG PO TBEC
40.0000 mg | DELAYED_RELEASE_TABLET | Freq: Every day | ORAL | Status: DC
  Administered 2019-09-18 – 2019-09-24 (×7): 40 mg via ORAL
  Filled 2019-09-18 (×7): qty 1

## 2019-09-18 MED ORDER — ADULT MULTIVITAMIN W/MINERALS CH
1.0000 | ORAL_TABLET | Freq: Every day | ORAL | Status: DC
  Administered 2019-09-18 – 2019-09-24 (×7): 1 via ORAL
  Filled 2019-09-18 (×7): qty 1

## 2019-09-18 MED ORDER — HEPARIN SODIUM (PORCINE) 5000 UNIT/ML IJ SOLN
5000.0000 [IU] | Freq: Three times a day (TID) | INTRAMUSCULAR | Status: DC
  Administered 2019-09-18 – 2019-09-20 (×6): 5000 [IU] via SUBCUTANEOUS
  Filled 2019-09-18 (×6): qty 1

## 2019-09-18 MED ORDER — FENTANYL CITRATE (PF) 100 MCG/2ML IJ SOLN
25.0000 ug | Freq: Once | INTRAMUSCULAR | Status: AC
  Administered 2019-09-18: 25 ug via INTRAVENOUS
  Filled 2019-09-18: qty 2

## 2019-09-18 MED ORDER — TRAMADOL HCL 50 MG PO TABS
50.0000 mg | ORAL_TABLET | Freq: Four times a day (QID) | ORAL | Status: DC | PRN
  Administered 2019-09-18 – 2019-09-24 (×15): 50 mg via ORAL
  Filled 2019-09-18 (×15): qty 1

## 2019-09-18 MED ORDER — AMLODIPINE BESYLATE 10 MG PO TABS
10.0000 mg | ORAL_TABLET | Freq: Every day | ORAL | Status: DC
  Administered 2019-09-18 – 2019-09-24 (×7): 10 mg via ORAL
  Filled 2019-09-18 (×7): qty 1

## 2019-09-18 NOTE — Progress Notes (Signed)
Modified Barium Swallow Progress Note  Patient Details  Name: Paul [name] MRN: [id number] Date of Birth: 09-Feb-1966  Today's Date: 09/18/2019  Modified Barium Swallow completed.  Full report located under Chart Review in the Imaging Section.  Brief recommendations include the following:  Clinical Impression  MBS was completed using thin liquids via spoon and self fed cup sips, nectar thick liquids via self fed cup sips, pureed material and dual textured solids.  The patient presented with an oral and pharyngeal dysphagia.  Recommend a pureed diet with nectar thick liquids.  Suggest medication whole in pureed material.  Patient may initially require supervision to pace rate and size of intake.  He would benefit from intense post acute therapy to address deficits.  Please see below for information regarding swallowing physiology.    Oral Phase   -Disorganized lingual movement which led to decreased bolus cohesion with liquids falling to level of the pyriform sinuses and solids to the level of the vallecular prior to the swallow trigger.   -Decreased labial seal which led to anterior escape mostly with liquids especially on the left.   -Decreased lingual movement which led to a delay in oral transit.     Pharyngeal Phase   -Swallow trigger was timely.   -Base of tongue retraction and pharyngeal stripping appeared to be functional.   -Decreased laryngeal elevation and anterior hyoid excusion which led to decreased laryngeal vestibule closure.  -Decreased epiglottic movement.     -Aspiration during the swallow given large self fed cup sip of thin liquds with a strong cough response.  No penetration or aspiration was seen on spoon sips or very small self fed cup sips (ie: bolus was smaller then spoon sip presented by therapist)   -On view after first bolus of pureed material which was difficult to fully view due to patient positioning and position of camera, material was noted on the posterior  tracheal wall with no cough response.  Cued cough was not effective to clear material from the airway.  No penetration or aspiration was seen on two subsequent swallows.    Esophageal Phase   -Sweep did not reveal overt issues.       Swallow Evaluation Recommendations       SLP Diet Recommendations: Nectar thick liquid;Dysphagia 1 (Puree) solids   Liquid Administration via: Cup   Medication Administration: Whole meds with puree   Supervision: Staff to assist with self feeding   Compensations: Slow rate;Small sips/bites   Postural Changes: Seated upright at 90 degrees   Oral Care Recommendations: Oral care BID   Other Recommendations: Have oral suction available;Prohibited food (jello, ice cream, thin soups)   Shelly Flatten, MA, CCC-SLP Acute Rehab SLP [phone]  Lamar Sprinkles 09/18/2019,11:58 AM

## 2019-09-18 NOTE — Progress Notes (Signed)
PT Cancellation Note  Patient Details Name: Paul [name] MRN: [id number] DOB: 21-Apr-1966   Cancelled Treatment:    Reason Eval/Treat Not Completed: Active bedrest order   Roney Marion, PT  Acute Rehabilitation Services Pager [phone] Office [phone]    Colletta [place] 09/18/2019, 7:42 AM

## 2019-09-18 NOTE — Progress Notes (Signed)
  Echocardiogram 2D Echocardiogram has been performed.  Paul [name] 09/18/2019, 5:04 PM
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
## 2019-09-18 NOTE — Progress Notes (Signed)
STROKE TEAM PROGRESS NOTE   INTERVAL HISTORY His wife is at the bedside.  Pt just came back from CT, which showed stable ICH and IVH. Pt complains of 10/10 HA, holospheric. Still has left hemiplgia and left facial droop. Passed swallow, on nectar thick liquid. Still on cleviprex.   OBJECTIVE Vitals:   09/18/19 0530 09/18/19 0545 09/18/19 0600 09/18/19 0615  BP: (!) 147/86 138/90 (!) 149/88 (!) 150/90  Pulse: 78 78 78 80  Resp: 18 18 20 19   Temp:      TempSrc:      SpO2: 96% 93% 97% 97%  Weight:      Height:        CBC:  Recent Labs  Lab 09/17/19 1231 09/17/19 1239  WBC 7.4  --   NEUTROABS 3.5  --   HGB 15.2 15.6  HCT 45.4 46.0  MCV 92.7  --   PLT 412*  --     Basic Metabolic Panel:  Recent Labs  Lab 09/17/19 1231 09/17/19 1239  NA 139 139  K 3.7 3.7  CL 100 99  CO2 28  --   GLUCOSE 102* 100*  BUN 16 17  CREATININE 0.99 0.90  CALCIUM 9.7  --     Lipid Panel:     Component Value Date/Time   CHOL 133 01/18/2019 0415   TRIG 108 01/18/2019 0415   HDL 26 (L) 01/18/2019 0415   CHOLHDL 5.1 01/18/2019 0415   VLDL 22 01/18/2019 0415   LDLCALC 85 01/18/2019 0415   HgbA1c:  Lab Results  Component Value Date   HGBA1C 5.7 (H) 01/18/2019   Urine Drug Screen:     Component Value Date/Time   LABOPIA NONE DETECTED 09/17/2019 1229   COCAINSCRNUR NONE DETECTED 09/17/2019 1229   LABBENZ NONE DETECTED 09/17/2019 1229   AMPHETMU NONE DETECTED 09/17/2019 1229   THCU POSITIVE (A) 09/17/2019 1229   LABBARB NONE DETECTED 09/17/2019 1229    Alcohol Level     Component Value Date/Time   ETH <10 09/17/2019 1229    IMAGING  CT Angio Head W or Wo Contrast CT ANGIO NECK W OR WO CONTRAST 09/17/2019 IMPRESSION:  1. "Spot sign" at the right thalamic hemorrhage, associated with progressive hemorrhages. No underlying vascular malformation or aneurysm.  2. Atherosclerosis with up to 50% stenosis at the left ICA bulb and left cavernous segment.  3. Left brachiocephalic  vein stenosis.   CT HEAD CODE STROKE WO CONTRAST 09/17/2019 IMPRESSION:  1. 10 cc right thalamic hematoma with intraventricular extension dilating the right temporal horn. Bleeds in this location are often hypertensive.  2. 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   ECG - SR rate 78 BPM. LVH (See cardiology reading for complete details)  PHYSICAL EXAM  Temp:  [97.9 F (36.6 C)-99.1 F (37.3 C)] 98.9 F (37.2 C) (02/21 0800) Pulse Rate:  [56-84] 80 (02/21 1000) Resp:  [12-25] 24 (02/21 1000) BP: (122-238)/(69-133) 155/91 (02/21 1000) SpO2:  [93 %-100 %] 97 % (02/21 1000) Weight:  [77.1 kg] 77.1 kg (02/20 1232)  General - Well nourished, well developed, mild distress due to HA.  Ophthalmologic - fundi not visualized due to noncooperation.  Cardiovascular - Regular rhythm and rate.  Mental Status -  Level of arousal and orientation to time, place, and person were intact. Language including expression, naming, repetition, comprehension was assessed and found intact. Fund of Knowledge was assessed and was intact.  Cranial Nerves II - XII - II - Visual field intact OU. III, IV,  VI - Extraocular movements intact. V - Facial sensation decreased on the left, about 50% of the right. VII - left facial droop. VIII - Hearing & vestibular intact bilaterally. X - Palate elevates symmetrically. Mild dysarthria XI - Chin turning & shoulder shrug intact bilaterally. XII - Tongue protrusion intact.  Motor Strength - The patient's strength was normal in right upper and lower extremities, however, LUE 0/5 and LLE 1/5.  Bulk was normal and fasciculations were absent.   Motor Tone - Muscle tone was assessed at the neck and appendages and was normal.  Reflexes - The patient's reflexes were symmetrical in all extremities and he had no pathological reflexes.  Sensory - Light touch, temperature/pinprick were assessed and were decreased on the left, about 5-10% of the right.    Coordination - The  patient had normal movements in the right hand and foot with no ataxia or dysmetria.  Tremor was absent.  Gait and Station - deferred.   ASSESSMENT/PLAN [name] is a 54 y.o. male with history of embolic stroke in June [id number], CHF, CKD stage 2, HTN and HLD and ongoing tobacco use and UDS + for cannabis presenting with SBP 240, right gaze preference, left hemiparesis, left hemianopia and left neglect syndrome. He did not receive IV t-PA due to hemorrhage.  ICH - right thalamic ICH with IVH dilating the right temporal horn likely secondary to uncontrolled hypertension  Code Stroke CT Head - 10 cc right thalamic hematoma with intraventricular extension dilating the right temporal horn. 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   CTA H&N - "Spot sign" at the right thalamic hemorrhage. No underlying vascular malformation or aneurysm. Atherosclerosis with up to 50% stenosis at the left ICA bulb and left cavernous segment.  CT head repeat showed stable ICH and IVH  EF 55-60%.   Sars Corona Virus 2 - negative  LDL - 85  HgbA1c - 5.7  UDS - positive for THCU  VTE prophylaxis - SCDs  aspirin 81 mg daily and clopidogrel 75 mg daily prior to admission, now on No antithrombotic due to [place]  Ongoing aggressive stroke risk factor management  Therapy recommendations:  pending  Disposition:  Pending  History of stroke  12/2018 admitted for right-sided weakness.  MRI showed bilateral MCA punctate small infarct.  Carotid Doppler negative.  EF 60 to 65%.  TCD negative.  TEE unremarkable, no PFO.  LDL 85 A1c 5.7.  Hypercoagulable work-up negative.  DVT negative.  Had thrombocytosis at that time, however JAK2 mutation negative.  Discharged with aspirin Plavix for 3 months.  Hypertensive emergency  Home BP meds: Coreg ; Norvasc  Current BP meds: Cleviprex  Resume home Coreg and Norvasc  Add losartan 50 . SBP goal < 160 mm Hg  . Long-term BP goal normotensive  Hyperlipidemia  Home Lipid  lowering medication: Crestor 20 mg daily  LDL 85, goal < 70  Hold off crestor for now given ICH  Resume statin at discharge  Tobacco abuse  Current smoker  Smoking cessation counseling provided  Pt is willing to quit  Other Stroke Risk Factors  ETOH use, advised to drink no more than 1 alcoholic beverage per day.  Congestive Heart Failure  Substance Abuse - positive for THC, educated on cessation  Other Active Problems  Code status - Limited  EKG with ST-T change, troponin negative, patient asymptomatic.  Hospital day # 1  This patient is critically ill due to [place] and IVH, hypertensive emergency, history of embolic strokes and  at significant risk of neurological worsening, death form recurrent stroke, hematoma expansion, hydrocephalus, seizure, heart failure. This patient's care requires constant monitoring of vital signs, hemodynamics, respiratory and cardiac monitoring, review of multiple databases, neurological assessment, discussion with family, other specialists and medical decision making of high complexity. I spent 35 minutes of neurocritical care time in the care of this patient. I had long discussion with patient and wife at bedside, updated pt current condition, treatment plan and potential prognosis, and answered all the questions.  They expressed understanding and appreciation.   Rosalin Hawking, MD PhD Stroke Neurology 09/18/2019 5:41 PM    To contact Stroke Continuity provider, please refer to [email or web address] After hours, contact General Neurology

## 2019-09-18 NOTE — Progress Notes (Signed)
OT Cancellation Note  Patient Details Name: Ja Dellaquila MRN: [id number] DOB: 06-02-1966   Cancelled Treatment:    Reason Eval/Treat Not Completed: Active bedrest order. Will return as schedule allows and pt medically ready.  [place], OTR/L Acute Rehab Pager: [phone] Office: [phone] 09/18/2019, 7:49 AM

## 2019-09-18 NOTE — Progress Notes (Signed)
Patient expressing desire to change designated visitor to girlfriend, Paul [name]. Current visitor is son Paul [name], who is at bedside. Patient and son in agreement, visitation policy explained, both patient and son express understanding that designated visitor can not be changed again.   Candy Sledge, RN

## 2019-09-19 DIAGNOSIS — I610 Nontraumatic intracerebral hemorrhage in hemisphere, subcortical: Secondary | ICD-10-CM

## 2019-09-19 LAB — LIPID PANEL
Cholesterol: 208 mg/dL — ABNORMAL HIGH (ref 0–200)
HDL: 31 mg/dL — ABNORMAL LOW (ref >40)
LDL Cholesterol: 152 mg/dL — ABNORMAL HIGH (ref 0–99)
Total CHOL/HDL Ratio: 6.7 RATIO
Triglycerides: 126 mg/dL (ref <150)
VLDL: 25 mg/dL (ref 0–40)

## 2019-09-19 LAB — CBC
HCT: 42.6 % (ref 39.0–52.0)
Hemoglobin: 14.2 g/dL (ref 13.0–17.0)
MCH: 30.8 pg (ref 26.0–34.0)
MCHC: 33.3 g/dL (ref 30.0–36.0)
MCV: 92.4 fL (ref 80.0–100.0)
Platelets: 360 10*3/uL (ref 150–400)
RBC: 4.61 MIL/uL (ref 4.22–5.81)
RDW: 13.5 % (ref 11.5–15.5)
WBC: 8 10*3/uL (ref 4.0–10.5)
nRBC: 0 % (ref 0.0–0.2)

## 2019-09-19 LAB — BASIC METABOLIC PANEL
Anion gap: 11 (ref 5–15)
BUN: 13 mg/dL (ref 6–20)
CO2: 25 mmol/L (ref 22–32)
Calcium: 9.3 mg/dL (ref 8.9–10.3)
Chloride: 102 mmol/L (ref 98–111)
Creatinine, Ser: 1.01 mg/dL (ref 0.61–1.24)
GFR calc Af Amer: >60 mL/min (ref >60)
GFR calc non Af Amer: >60 mL/min (ref >60)
Glucose, Bld: 109 mg/dL — ABNORMAL HIGH (ref 70–99)
Potassium: 3.7 mmol/L (ref 3.5–5.1)
Sodium: 138 mmol/L (ref 135–145)

## 2019-09-19 LAB — TRIGLYCERIDES: Triglycerides: 197 mg/dL — ABNORMAL HIGH (ref <150)

## 2019-09-19 LAB — HEMOGLOBIN A1C
Hgb A1c MFr Bld: 6.2 % — ABNORMAL HIGH (ref 4.8–5.6)
Mean Plasma Glucose: 131.24 mg/dL

## 2019-09-19 MED ORDER — HYDRALAZINE HCL 50 MG PO TABS
50.0000 mg | ORAL_TABLET | Freq: Three times a day (TID) | ORAL | Status: DC
  Administered 2019-09-19 – 2019-09-20 (×3): 50 mg via ORAL
  Filled 2019-09-19 (×3): qty 1

## 2019-09-19 MED ORDER — LOSARTAN POTASSIUM 50 MG PO TABS
50.0000 mg | ORAL_TABLET | Freq: Two times a day (BID) | ORAL | Status: DC
  Administered 2019-09-19 – 2019-09-24 (×10): 50 mg via ORAL
  Filled 2019-09-19 (×11): qty 1

## 2019-09-19 MED ORDER — SODIUM CHLORIDE 0.9 % IV SOLN: INTRAVENOUS | Status: DC

## 2019-09-19 NOTE — Progress Notes (Signed)
Rehab Admissions Coordinator Note:  Patient was screened by Cleatrice Burke for appropriateness for an Inpatient Acute Rehab Consult per PT recs.  At this time, we are recommending Inpatient Rehab consult. I will place order per protocol.  Cleatrice Burke RN MSN 09/19/2019, 12:42 PM  I can be reached at [phone].

## 2019-09-19 NOTE — Progress Notes (Signed)
STROKE TEAM PROGRESS NOTE   INTERVAL HISTORY RN at bedside.  Patient mildly sleepy, but easily arousable.  Still has left hemiplegia but seems to have some movement on the left lower extremity.  Still on Cleviprex, still complaining of headache even on Fioricet and tramadol and Tylenol.  OBJECTIVE Vitals:   09/19/19 0915 09/19/19 0930 09/19/19 1000 09/19/19 1100  BP: (!) 156/99 (!) 151/93 (!) 146/92 (!) 160/98  Pulse: (!) 52 (!) 50 (!) 53 (!) 57  Resp: 15 (!) 30 11 16   Temp:      TempSrc:      SpO2: 96% 97% 98% 97%  Weight:      Height:        CBC:  Recent Labs  Lab 09/17/19 1231 09/17/19 1239 09/18/19 0935 09/19/19 0425  WBC 7.4   < > 7.2 8.0  NEUTROABS 3.5  --   --   --   HGB 15.2   < > 15.1 14.2  HCT 45.4   < > 43.9 42.6  MCV 92.7   < > 90.9 92.4  PLT 412*   < > 430* 360   < > = values in this interval not displayed.    Basic Metabolic Panel:  Recent Labs  Lab 09/18/19 0935 09/19/19 0425  NA 139 138  K 3.8 3.7  CL 101 102  CO2 26 25  GLUCOSE 120* 109*  BUN 13 13  CREATININE 1.09 1.01  CALCIUM 9.6 9.3    Lipid Panel:     Component Value Date/Time   CHOL 208 (H) 09/19/2019 0425   TRIG 126 09/19/2019 0425   HDL 31 (L) 09/19/2019 0425   CHOLHDL 6.7 09/19/2019 0425   VLDL 25 09/19/2019 0425   LDLCALC 152 (H) 09/19/2019 0425   HgbA1c:  Lab Results  Component Value Date   HGBA1C 6.2 (H) 09/19/2019   Urine Drug Screen:     Component Value Date/Time   LABOPIA NONE DETECTED 09/17/2019 1229   COCAINSCRNUR NONE DETECTED 09/17/2019 1229   LABBENZ NONE DETECTED 09/17/2019 1229   AMPHETMU NONE DETECTED 09/17/2019 1229   THCU POSITIVE (A) 09/17/2019 1229   LABBARB NONE DETECTED 09/17/2019 1229    Alcohol Level     Component Value Date/Time   ETH <10 09/17/2019 1229    IMAGING  CT Angio Head W or Wo Contrast CT ANGIO NECK W OR WO CONTRAST 09/17/2019 IMPRESSION:  1. "Spot sign" at the right thalamic hemorrhage, associated with progressive  hemorrhages. No underlying vascular malformation or aneurysm.  2. Atherosclerosis with up to 50% stenosis at the left ICA bulb and left cavernous segment.  3. Left brachiocephalic vein stenosis.   CT HEAD CODE STROKE WO CONTRAST 09/17/2019 IMPRESSION:  1. 10 cc right thalamic hematoma with intraventricular extension dilating the right temporal horn. Bleeds in this location are often hypertensive.  2. 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   ECG - SR rate 78 BPM. LVH (See cardiology reading for complete details)  PHYSICAL EXAM    Temp:  [97.2 F (36.2 C)-99.5 F (37.5 C)] 99.5 F (37.5 C) (02/22 0815) Pulse Rate:  [50-83] 57 (02/22 1100) Resp:  [11-30] 16 (02/22 1100) BP: (129-185)/(61-135) 160/98 (02/22 1100) SpO2:  [91 %-100 %] 97 % (02/22 1100)  General - Well nourished, well developed, mild sleepy.  Ophthalmologic - fundi not visualized due to noncooperation.  Cardiovascular - Regular rhythm and rate.  Mental Status -  Mildly sleepy, easily arousable and orientation to time, place, and person were intact.  Language including expression, naming, repetition, comprehension was assessed and found intact.  Mild dysarthria Fund of Knowledge was assessed and was intact.  Cranial Nerves II - XII - II - Visual field intact OU. III, IV, VI - Extraocular movements intact. V - Facial sensation decreased on the left, about 50% of the right. VII - left facial droop. VIII - Hearing & vestibular intact bilaterally. X - Palate elevates symmetrically. Mild dysarthria XI - Chin turning & shoulder shrug intact bilaterally. XII - Tongue protrusion intact.  Motor Strength - The patient's strength was normal in right upper and lower extremities, however, LUE 1/5 and LLE 2/5 with pain stimulation.  Bulk was normal and fasciculations were absent.   Motor Tone - Muscle tone was assessed at the neck and appendages and was normal.  Reflexes - The patient's reflexes were symmetrical in all  extremities and he had no pathological reflexes.  Sensory - Light touch, temperature/pinprick were assessed and were diminished on the left, about 5-10% of the right.    Coordination - The patient had normal movements in the right hand and foot with no ataxia or dysmetria.  Tremor was absent.  Gait and Station - deferred.   ASSESSMENT/PLAN Mr. Jahmir Sokalski is a 54 y.o. male with history of embolic stroke in June [id number], CHF, CKD stage 2, HTN and HLD and ongoing tobacco use and UDS + for cannabis presenting with SBP 240, right gaze preference, left hemiparesis, left hemianopia and left neglect syndrome. He did not receive IV t-PA due to hemorrhage.  ICH - right thalamic ICH with IVH dilating the right temporal horn likely secondary to uncontrolled hypertension  Code Stroke CT Head - 10 cc right thalamic hematoma with intraventricular extension dilating the right temporal horn. 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   CTA H&N - "Spot sign" at the right thalamic hemorrhage. No underlying vascular malformation or aneurysm. Atherosclerosis with up to 50% stenosis at the left ICA bulb and left cavernous segment.  CT head repeat showed stable ICH and IVH  CT repeat pending in am  EF 55-60%.   [employer] Virus 2 - negative  LDL - 85  HgbA1c - 5.7  UDS - positive for THCU  VTE prophylaxis - Heparin 5000 units sq tid   aspirin 81 mg daily and clopidogrel 75 mg daily prior to admission, now on No antithrombotic due to New Site  Ongoing aggressive stroke risk factor management  Therapy recommendations: CIR  Disposition:  Pending  History of stroke  12/2018 admitted for right-sided weakness.  MRI showed bilateral MCA punctate small infarct.  Carotid Doppler negative.  EF 60 to 65%.  TCD negative.  TEE unremarkable, no PFO.  LDL 85 A1c 5.7.  Hypercoagulable work-up negative.  DVT negative.  Had thrombocytosis at that time, however JAK2 mutation negative.  Discharged with aspirin Plavix for 3  months.  Hypertensive emergency  Home BP meds: Coreg ; Norvasc  Current BP meds: Cleviprex  Resume home Coreg and Norvasc  Increase losartan 50 to bid  Add hydralazine 50 Q8 . SBP goal < 160 mm Hg  . Taper off cleviprex as able . Long-term BP goal normotensive  Hyperlipidemia  Home Lipid lowering medication: Crestor 20 mg daily  LDL 85, goal < 70  Hold off crestor for now given ICH  Resume statin at discharge  Tobacco abuse  Current smoker  Smoking cessation counseling provided  Pt is willing to quit  Dysphagia . Secondary to stroke . Cleared for D1 nectar  thick liqs . Speech on board   Other Stroke Risk Factors  ETOH use, advised to drink no more than 1 alcoholic beverage per day.  Congestive Heart Failure  Substance Abuse - positive for THC, educated on cessation  Other Active Problems  Code status - Limited  EKG with ST-T change, troponin negative, patient asymptomatic.  Hospital day # 2  This patient is critically ill due to [place] and IVH, hypertensive emergency, history of embolic strokes and at significant risk of neurological worsening, death form recurrent stroke, hematoma expansion, hydrocephalus, seizure, heart failure. This patient's care requires constant monitoring of vital signs, hemodynamics, respiratory and cardiac monitoring, review of multiple databases, neurological assessment, discussion with family, other specialists and medical decision making of high complexity. I spent 35 minutes of neurocritical care time in the care of this patient.    Rosalin Hawking, MD PhD Stroke Neurology 09/19/2019 11:46 AM    To contact Stroke Continuity provider, please refer to [email or web address] After hours, contact General Neurology

## 2019-09-19 NOTE — Progress Notes (Signed)
  Speech Language Pathology Treatment: Dysphagia;Cognitive-Linquistic  Patient Details Name: Paul [name] MRN: [id number] DOB: 23-Sep-1965 Today's Date: 09/19/2019 Time: [id number] SLP Time Calculation (min) (ACUTE ONLY): 25 min  Assessment / Plan / Recommendation Clinical Impression  Pt encountered in bed with breakfast tray, with purees and nectar thick liquids. While eating drier purees (egg/sausage), he was noted to have more significant left pocketing. He required Max cues to utilize strategies to reduce amount of food on left side of mouth with low frustration threshold. Pt attempted to use a liquid wash/lingual sweep which seemed to be ineffective. He cleared the left side of this mouth better when using a spoon/oral swab or more moist purees. Anterior loss was noted when he used oral swab. He says he is aware that he is pocketing food, but his anticipatory awareness regarding impairments/difficulties continues to be impaired, requiring Mod-Max cueing. Recommend continuing with puree diet while using compensatory strategies to clear L pocketing (oral swab/spoon, or mix drier textures with liquid based purees).   HPI HPI: Paul [name] is an 54 y.o. male with history of embolic stroke in June [id number], HTN and HLD and ongoing tobacco use and UDS + for cannabis. 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 He went to the ER triage at [facility] and a code stroke was activated. He was seen by tele-neurology emergently and CT of the head was showing a right thalamic hematoma with intraventricular extension dilating the right temporal horn.  He was transferred to [facility] for further neurologic care. Upon arrival he has right gaze preference, left hemiparesis and left hemianopia and left neglect syndrome.      SLP Plan  Continue with current plan of care       Recommendations  Diet recommendations: Dysphagia 1 (puree);Nectar-thick liquid Liquids provided  via: Cup Medication Administration: Whole meds with puree Supervision: Full supervision/cueing for compensatory strategies;Staff to assist with self feeding Compensations: Minimize environmental distractions;Slow rate;Small sips/bites;Lingual sweep for clearance of pocketing;Clear throat intermittently Postural Changes and/or Swallow Maneuvers: Seated upright 90 degrees                Oral Care Recommendations: Oral care BID Follow up Recommendations: Inpatient Rehab SLP Visit Diagnosis: Dysphagia, oropharyngeal phase (R13.12);Cognitive communication deficit (R41.841) Plan: Continue with current plan of care       [place], Student SLP Office: [phone]  09/19/2019, 9:41 AM

## 2019-09-19 NOTE — Consult Note (Signed)
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 Frequency (ACUTE ONLY) min 2x/week Treatment Duration 2 weeks      CHL IP ORAL PHASE 09/18/2019 Oral Phase Impaired Oral - Pudding Teaspoon -- Oral - Pudding Cup -- Oral - Honey Teaspoon -- Oral - Honey Cup -- Oral - Nectar Teaspoon -- Oral - Nectar Cup Delayed oral transit;Premature spillage;Holding of bolus;Left anterior bolus loss Oral - Nectar Straw -- Oral - Thin Teaspoon Left anterior bolus loss;Delayed oral transit;Premature spillage Oral - Thin Cup Delayed oral transit;Premature spillage;Left anterior bolus loss Oral - Thin Straw -- Oral - Puree Delayed oral transit;Premature spillage Oral - Mech Soft -- Oral - Regular -- Oral - Multi-Consistency Delayed oral transit;Premature spillage Oral - Pill -- Oral Phase - Comment --  CHL IP PHARYNGEAL PHASE 09/18/2019 Pharyngeal Phase Impaired Pharyngeal- Pudding Teaspoon -- Pharyngeal -- Pharyngeal- Pudding Cup -- Pharyngeal -- Pharyngeal- Honey Teaspoon -- Pharyngeal -- Pharyngeal- Honey Cup -- Pharyngeal -- Pharyngeal- Nectar Teaspoon -- Pharyngeal -- Pharyngeal- Nectar Cup Delayed swallow initiation-pyriform sinuses;Reduced  epiglottic inversion;Reduced anterior laryngeal mobility;Reduced laryngeal elevation Pharyngeal -- Pharyngeal- Nectar Straw -- Pharyngeal -- Pharyngeal- Thin Teaspoon Delayed swallow initiation-pyriform sinuses;Reduced epiglottic inversion;Reduced anterior laryngeal mobility;Reduced laryngeal elevation Pharyngeal -- Pharyngeal- Thin Cup Delayed swallow initiation-pyriform sinuses;Reduced epiglottic inversion;Reduced anterior laryngeal mobility;Reduced laryngeal elevation;Reduced airway/laryngeal closure;Penetration/Aspiration during swallow;Moderate aspiration Pharyngeal Material enters airway, passes BELOW cords and not ejected out despite cough attempt by patient Pharyngeal- Thin Straw -- Pharyngeal -- Pharyngeal- Puree Delayed swallow initiation-vallecula;Reduced epiglottic inversion;Reduced anterior laryngeal mobility;Reduced laryngeal elevation Pharyngeal -- Pharyngeal- Mechanical Soft -- Pharyngeal -- Pharyngeal- Regular -- Pharyngeal -- Pharyngeal- Multi-consistency Delayed swallow initiation-vallecula;Reduced epiglottic inversion;Reduced anterior laryngeal mobility;Reduced laryngeal elevation Pharyngeal -- Pharyngeal- Pill -- Pharyngeal -- Pharyngeal Comment --  CHL IP CERVICAL ESOPHAGEAL PHASE 09/18/2019 Cervical Esophageal Phase [facility] Pudding  Teaspoon -- Pudding Cup -- Honey Teaspoon -- Honey Cup -- Nectar Teaspoon -- Nectar Cup -- Nectar Straw -- Thin Teaspoon -- Thin Cup -- Thin Straw -- Puree -- Mechanical Soft -- Regular -- Multi-consistency -- Pill -- Cervical Esophageal Comment -- Shelly Flatten, MA, CCC-SLP Acute Rehab SLP [phone] Lamar Sprinkles 09/18/2019, 12:02 PM              ECHOCARDIOGRAM COMPLETE  Result Date: 09/18/2019    ECHOCARDIOGRAM REPORT   Patient Name:   Paul [name] Date of Exam: 09/18/2019 Medical Rec #:  [id number]  Height:       72.0 in Accession #:    [id number] Weight:       170.0 lb Date of Birth:  30-Sep-1965  BSA:          1.988 m Patient Age:    22 years   BP:            136/72 mmHg Patient Gender: M          HR:           67 bpm. Exam Location:  Inpatient Procedure: 2D Echo, Cardiac Doppler and Color Doppler Indications:    Stroke 434.91 / I163.9  History:        Patient has prior history of Echocardiogram examinations, most                 recent 01/19/2019. CHF; Risk Factors:Hypertension, Dyslipidemia                 and Current Smoker. CKD.  Sonographer:    Clayton Lefort RDCS (AE) Referring Phys: [id number] [place]  1. Left ventricular ejection fraction, by estimation, is 55 to 60%. The left ventricle has normal function. The left ventricle has no regional wall motion abnormalities. There is severe left ventricular hypertrophy. Left ventricular diastolic parameters  are indeterminate.  2. Right ventricular systolic function is normal. The right ventricular size is normal. Tricuspid regurgitation signal is inadequate for assessing PA pressure.  3. The mitral valve is grossly normal. Trivial mitral valve regurgitation.  4. The aortic valve is tricuspid. Aortic valve regurgitation is not visualized.  5. The inferior vena cava is normal in size with greater than 50% respiratory variability, suggesting right atrial pressure of 3 mmHg. FINDINGS  Left Ventricle: Left ventricular ejection fraction, by estimation, is 55 to 60%. The left ventricle has normal function. The left ventricle has no regional wall motion abnormalities. The left ventricular internal cavity size was normal in size. There is  severe left ventricular hypertrophy. Left ventricular diastolic parameters are indeterminate. Right Ventricle: The right ventricular size is normal. No increase in right ventricular wall thickness. Right ventricular systolic function is normal. Tricuspid regurgitation signal is inadequate for assessing PA pressure. Left Atrium: Left atrial size was normal in size. Right Atrium: Right atrial size was normal in size. Pericardium: There is no evidence of pericardial effusion. Mitral  Valve: The mitral valve is grossly normal. Trivial mitral valve regurgitation. Tricuspid Valve: The tricuspid valve is grossly normal. Tricuspid valve regurgitation is trivial. Aortic Valve: The aortic valve is tricuspid. Aortic valve regurgitation is not visualized. Aortic valve mean gradient measures 8.0 mmHg. Aortic valve peak gradient measures 11.8 mmHg. Aortic valve area, by VTI measures 1.73 cm. Pulmonic Valve: The pulmonic valve was grossly normal. Pulmonic valve regurgitation is not visualized. Aorta: The aortic root is normal in size and structure. Venous: The inferior vena cava is normal in size with greater than  50% respiratory variability, suggesting right atrial pressure of 3 mmHg. IAS/Shunts: No atrial level shunt detected by color flow Doppler.  LEFT VENTRICLE PLAX 2D LVIDd:         4.30 cm  Diastology LVIDs:         3.30 cm  LV e' lateral:   7.94 cm/s LV PW:         1.90 cm  LV E/e' lateral: 10.4 LV IVS:        1.60 cm  LV e' medial:    5.11 cm/s LVOT diam:     1.90 cm  LV E/e' medial:  16.1 LV SV:         64.36 ml LV SV Index:   32.37 LVOT Area:     2.84 cm  RIGHT VENTRICLE             IVC RV Basal diam:  3.10 cm     IVC diam: 1.70 cm RV S prime:     16.20 cm/s TAPSE (M-mode): 2.1 cm LEFT ATRIUM             Index       RIGHT ATRIUM           Index LA diam:        3.50 cm 1.76 cm/m  RA Area:     14.40 cm LA Vol (A2C):   68.1 ml 34.25 ml/m RA Volume:   35.70 ml  17.95 ml/m LA Vol (A4C):   37.0 ml 18.61 ml/m LA Biplane Vol: 52.2 ml 26.25 ml/m  AORTIC VALVE AV Area (Vmax):    1.86 cm AV Area (Vmean):   1.45 cm AV Area (VTI):     1.73 cm AV Vmax:           172.00 cm/s AV Vmean:          136.000 cm/s AV VTI:            0.372 m AV Peak Grad:      11.8 mmHg AV Mean Grad:      8.0 mmHg LVOT Vmax:         113.00 cm/s LVOT Vmean:        69.700 cm/s LVOT VTI:          0.227 m LVOT/AV VTI ratio: 0.61  AORTA Ao Root diam: 3.30 cm MITRAL VALVE MV Area (PHT): 2.80 cm    SHUNTS MV Decel Time: 271 msec     Systemic VTI:  0.23 m MV E velocity: 82.30 cm/s  Systemic Diam: 1.90 cm MV A velocity: 48.40 cm/s MV E/A ratio:  1.70 Rozann Lesches MD Electronically signed by Rozann Lesches MD Signature Date/Time: 09/18/2019/5:25:10 PM    Final      Assessment/Plan: Diagnosis: right thalamic hemorrhage with IV extension, dense left hemiparesis and inattention 1. Does the need for close, 24 hr/day medical supervision in concert with the patient's rehab needs make it unreasonable for this patient to be served in a less intensive setting? Yes 2. Co-Morbidities requiring supervision/potential complications: CKD II, dCHF, HTN, polysubstance abuse 3. Due to bladder management, bowel management, safety, skin/wound care, disease management, medication administration, pain management and patient education, does the patient require 24 hr/day rehab nursing? Yes 4. Does the patient require coordinated care of a physician, rehab nurse, therapy disciplines of PT, OT, SLP to address physical and functional deficits in the context of the above medical diagnosis(es)? Yes Addressing deficits in the following areas: balance, endurance, locomotion, strength, transferring, bowel/bladder control,  bathing, dressing, feeding, grooming, toileting, cognition, speech and psychosocial support 5. Can the patient actively participate in an intensive therapy program of at least 3 hrs of therapy per day at least 5 days per week? Yes 6. The potential for patient to  7. make measurable gains while on inpatient rehab is excellent 8. Anticipated functional outcomes upon discharge from inpatient rehab are supervision and min assist  with PT, supervision and min assist with OT, modified independent and supervision with SLP. 9. Estimated rehab length of stay to reach the above functional goals is: 18-24 days 10. Anticipated discharge destination: Home 11. Overall Rehab/Functional Prognosis: excellent  RECOMMENDATIONS: This patient's condition is  appropriate for continued rehabilitative care in the following setting: CIR Patient has agreed to participate in recommended program. Yes Note that insurance prior authorization may be required for reimbursement for recommended care.  Comment:  Recommend PRAFO, WHO LLE and LUE respectively.   Rehab Admissions Coordinator to follow up.  Thanks,  Meredith Staggers, MD, Mellody Drown  I have personally performed a face to face diagnostic evaluation of this patient. Additionally, I have examined pertinent labs and radiographic images. I have reviewed and concur with the physician assistant's documentation above.    Lavon Paganini Angiulli, PA-C 09/19/2019

## 2019-09-19 NOTE — Progress Notes (Signed)
Initial Nutrition Assessment  DOCUMENTATION CODES:   Not applicable  INTERVENTION:   Hormel Shake daily with Breakfast which provides 520 kcals and 22 g of protein   Magic cup BID with lunch and dinner, each supplement provides 290 kcal and 9 grams of protein, automatically on meal trays to optimize nutritional intake.   Encourage PO intake  NUTRITION DIAGNOSIS:   Inadequate oral intake related to dysphagia as evidenced by meal completion < 50%(limited diet).  GOAL:   Patient will meet greater than or equal to 90% of their needs  MONITOR:   PO intake, Supplement acceptance, Diet advancement  REASON FOR ASSESSMENT:   Malnutrition Screening Tool    ASSESSMENT:   Pt with PMH of recent embolic stroke in June [id number], CHF, CKD stage 2, HTN, HLD, ongoing tobacco abuse admitted with R thalamic ICH with IVH due to uncontrolled HTN. UDS positive for cannabis.   Pt discussed during ICU rounds and with RN.  Pt with R gaze preference, L hemiparesis, and L neglect.  Pt passed a swallow evaluation and started on a dysphagia diet.  Therapies working with patient, CIR evaluation pending.  Per MST pt lost some weight after last hospitalization. Per chart review pt lost 8% of weight within 10 months.   Medications reviewed and include: MVI, senokot-s  Cleviprex @ 14 ml/hr provides: 672 kcal  Labs reviewed    NUTRITION - FOCUSED PHYSICAL EXAM:  Deferred   Diet Order:   Diet Order            DIET DYS 2 Room service appropriate? Yes; Fluid consistency: Nectar Thick  Diet effective now              EDUCATION NEEDS:   No education needs have been identified at this time  Skin:  Skin Assessment: Reviewed RN Assessment  Last BM:  unknown  Height:   Ht Readings from Last 1 Encounters:  09/17/19 6' (1.829 m)    Weight:   Wt Readings from Last 1 Encounters:  09/17/19 77.1 kg    Ideal Body Weight:  80.9 kg  BMI:  Body mass index is 23.06 kg/m.  Estimated  Nutritional Needs:   Kcal:  2100-2300  Protein:  95-110 grams  Fluid:  2 L/day  Lockie Pares., RD, LDN, CNSC See AMiON for contact information
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
## 2019-09-20 ENCOUNTER — Inpatient Hospital Stay (HOSPITAL_COMMUNITY): Payer: Medicaid Other

## 2019-09-20 DIAGNOSIS — R1312 Dysphagia, oropharyngeal phase: Secondary | ICD-10-CM

## 2019-09-20 LAB — BASIC METABOLIC PANEL
Anion gap: 10 (ref 5–15)
BUN: 12 mg/dL (ref 6–20)
CO2: 26 mmol/L (ref 22–32)
Calcium: 9.5 mg/dL (ref 8.9–10.3)
Chloride: 102 mmol/L (ref 98–111)
Creatinine, Ser: 0.92 mg/dL (ref 0.61–1.24)
GFR calc Af Amer: >60 mL/min (ref >60)
GFR calc non Af Amer: >60 mL/min (ref >60)
Glucose, Bld: 110 mg/dL — ABNORMAL HIGH (ref 70–99)
Potassium: 4 mmol/L (ref 3.5–5.1)
Sodium: 138 mmol/L (ref 135–145)

## 2019-09-20 LAB — CBC
HCT: 44.9 % (ref 39.0–52.0)
Hemoglobin: 14.8 g/dL (ref 13.0–17.0)
MCH: 30.5 pg (ref 26.0–34.0)
MCHC: 33 g/dL (ref 30.0–36.0)
MCV: 92.6 fL (ref 80.0–100.0)
Platelets: 382 10*3/uL (ref 150–400)
RBC: 4.85 MIL/uL (ref 4.22–5.81)
RDW: 13.7 % (ref 11.5–15.5)
WBC: 7.2 10*3/uL (ref 4.0–10.5)
nRBC: 0 % (ref 0.0–0.2)

## 2019-09-20 LAB — SODIUM
Sodium: 139 mmol/L (ref 135–145)
Sodium: 140 mmol/L (ref 135–145)

## 2019-09-20 MED ORDER — SODIUM CHLORIDE 3 % IV SOLN
INTRAVENOUS | Status: DC
  Administered 2019-09-20 (×2): 50 mL/h via INTRAVENOUS
  Administered 2019-09-21: 18:00:00 75 mL/h via INTRAVENOUS
  Administered 2019-09-21: 50 mL/h via INTRAVENOUS
  Filled 2019-09-20 (×10): qty 500

## 2019-09-20 MED ORDER — HYDRALAZINE HCL 50 MG PO TABS
75.0000 mg | ORAL_TABLET | Freq: Three times a day (TID) | ORAL | Status: DC
  Administered 2019-09-20 – 2019-09-21 (×3): 75 mg via ORAL
  Filled 2019-09-20 (×3): qty 1
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
## 2019-09-20 NOTE — TOC Initial Note (Signed)
Transition of Care [facility]) - Initial/Assessment Note    Patient Details  Name: Paul [name] MRN: [id number] Date of Birth: [birth year]  Transition of Care [facility]) CM/SW Contact:    Emeterio Reeve, [place] Phone Number: 09/20/2019, 12:28 PM  Clinical Narrative:  54 y.o. male admitted on 09/17/19 for L sided weakness and HA.  CTH showed R thalamic hemorrhage with IVH and positive "spot sign".  Pt with significant PMH of embolic stroke, spinal stenosis. Prior to admission patient lives with girlfriend and can provide assistance at discharge. SBIRT completed. Patient denies alcohol and substance use but tested positive for [facility] on admission.  PT/OT reccommended CIR and consult in progress.                 Expected Discharge Plan: IP Rehab Facility Barriers to Discharge: Continued Medical Work up   Patient Goals and CMS Choice        Expected Discharge Plan and Services Expected Discharge Plan: [place]       Living arrangements for the past 2 months: Single Family Home                                      Prior Living Arrangements/Services Living arrangements for the past 2 months: Single Family Home Lives with:: Significant Other Patient language and need for interpreter reviewed:: Yes Do you feel safe going back to the place where you live?: Yes      Need for Family Participation in Patient Care: Yes (Comment) Care giver support system in place?: Yes (comment)   Criminal Activity/Legal Involvement Pertinent to Current Situation/Hospitalization: No - Comment as needed  Activities of Daily Living Home Assistive Devices/Equipment: None ADL Screening (condition at time of admission) Patient's cognitive ability adequate to safely complete daily activities?: Yes Is the patient deaf or have difficulty hearing?: Yes Does the patient have difficulty seeing, even when wearing glasses/contacts?: Yes Does the patient have difficulty concentrating, remembering, or making  decisions?: No Patient able to express need for assistance with ADLs?: Yes Does the patient have difficulty dressing or bathing?: Yes Independently performs ADLs?: No Communication: Needs assistance Is this a change from baseline?: Change from baseline, expected to last >3 days Dressing (OT): Needs assistance Is this a change from baseline?: Change from baseline, expected to last >3 days Grooming: Needs assistance Is this a change from baseline?: Change from baseline, expected to last >3 days Feeding: Needs assistance Is this a change from baseline?: Change from baseline, expected to last >3 days Bathing: Needs assistance Is this a change from baseline?: Change from baseline, expected to last >3 days Toileting: Needs assistance Is this a change from baseline?: Change from baseline, expected to last >3days In/Out Bed: Needs assistance Is this a change from baseline?: Change from baseline, expected to last >3 days Walks in Home: Needs assistance Is this a change from baseline?: Change from baseline, expected to last >3 days Does the patient have difficulty walking or climbing stairs?: Yes Weakness of Legs: Left Weakness of Arms/Hands: Left  Permission Sought/Granted                  Emotional Assessment Appearance:: Appears stated age Attitude/Demeanor/Rapport: Engaged Affect (typically observed): Accepting Orientation: : Oriented to Self, Oriented to Place, Oriented to  Time, Oriented to Situation Alcohol / Substance Use: Not Applicable    Admission diagnosis:  ICH (intracerebral hemorrhage) ([place]) [I61.9] Hypertensive urgency [  I16.0] Right-sided nontraumatic intracerebral hemorrhage, unspecified cerebral location [facility]) [I61.9] Patient Active Problem List   Diagnosis Date Noted  . ICH (intracerebral hemorrhage) ([place]) 09/17/2019  . Acute CVA (cerebrovascular accident) ([place]) 01/18/2019  . Thrombocytosis ([place]) 01/18/2019  . Pancreatitis 01/07/2019  . Chronic diastolic CHF  (congestive heart failure) ([place]) 10/11/2018  . CKD (chronic kidney disease) stage 2, GFR 60-89 ml/min 10/11/2018  . Essential hypertension 05/22/2018  . Dyslipidemia 05/22/2018  . Renal lesion 05/22/2018  . Coronary artery calcification 05/22/2018  . Tobacco abuse 05/22/2018   PCP:  Patient, No Pcp Per Pharmacy:   [place], [place] [address] [place] [address] Phone: [phone] Fax: [phone]     Social Determinants of Health (SDOH) Interventions    Readmission Risk Interventions Readmission Risk Prevention Plan 11/10/2018  PCP or Specialist Appt within 5-7 Days Not Complete  Not Complete comments Follow-up appt made with [facility] (this was the first availabale appt they had)  Some recent data might be hidden

## 2019-09-20 NOTE — Progress Notes (Signed)
STROKE TEAM PROGRESS NOTE   INTERVAL HISTORY No family at the bedside.  Patient mildly lethargic, but largely neuro stable no significant change.  CT repeat this morning showed stable hematoma however mildly increased midline shift.  Will start 3% saline.  Still on Cleviprex.  OBJECTIVE Vitals:   09/20/19 0500 09/20/19 0600 09/20/19 0700 09/20/19 0800  BP: 137/85 (!) 151/100 (!) 157/84 (!) 153/94  Pulse: (!) 54 (!) 54 (!) 56 (!) 58  Resp: 13 11 13 16   Temp:    98 F (36.7 C)  TempSrc:    Oral  SpO2: 93% 97% 96% 95%  Weight:      Height:        CBC:  Recent Labs  Lab 09/17/19 1231 09/17/19 1239 09/19/19 0425 09/20/19 0515  WBC 7.4   < > 8.0 7.2  NEUTROABS 3.5  --   --   --   HGB 15.2   < > 14.2 14.8  HCT 45.4   < > 42.6 44.9  MCV 92.7   < > 92.4 92.6  PLT 412*   < > 360 382   < > = values in this interval not displayed.    Basic Metabolic Panel:  Recent Labs  Lab 09/19/19 0425 09/20/19 0515  NA 138 138  K 3.7 4.0  CL 102 102  CO2 25 26  GLUCOSE 109* 110*  BUN 13 12  CREATININE 1.01 0.92  CALCIUM 9.3 9.5    Lipid Panel:     Component Value Date/Time   CHOL 208 (H) 09/19/2019 0425   TRIG 197 (H) 09/19/2019 1214   HDL 31 (L) 09/19/2019 0425   CHOLHDL 6.7 09/19/2019 0425   VLDL 25 09/19/2019 0425   LDLCALC 152 (H) 09/19/2019 0425   HgbA1c:  Lab Results  Component Value Date   HGBA1C 6.2 (H) 09/19/2019   Urine Drug Screen:     Component Value Date/Time   LABOPIA NONE DETECTED 09/17/2019 1229   COCAINSCRNUR NONE DETECTED 09/17/2019 1229   LABBENZ NONE DETECTED 09/17/2019 1229   AMPHETMU NONE DETECTED 09/17/2019 1229   THCU POSITIVE (A) 09/17/2019 1229   LABBARB NONE DETECTED 09/17/2019 1229    Alcohol Level     Component Value Date/Time   ETH <10 09/17/2019 1229    IMAGING  CT HEAD CODE STROKE WO CONTRAST 09/17/2019 IMPRESSION:  1. 10 cc right thalamic hematoma with intraventricular extension dilating the right temporal horn. Bleeds in  this location are often hypertensive.  2. 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   CT Angio Head W or Wo Contrast CT ANGIO NECK W OR WO CONTRAST 09/17/2019 IMPRESSION:  1. "Spot sign" at the right thalamic hemorrhage, associated with progressive hemorrhages. No underlying vascular malformation or aneurysm.  2. Atherosclerosis with up to 50% stenosis at the left ICA bulb and left cavernous segment.  3. Left brachiocephalic vein stenosis.   CT HEAD WO CONTRAST 09/18/2019 Minimal increase of the right thalamic hematoma along the posterior margin. Intraventricular extension is unchanged and there is no ventriculomegaly.   CT HEAD WO CONTRAST 09/20/2019 1. No significant interval change in size of right thalamic hemorrhage, but with increased localized vasogenic edema as compared to previous. Associated regional mass effect with up to 5 mm of localized right-to-left shift, slightly increased. 2. Associated intraventricular extension, with slightly increased intraventricular hemorrhage as compared to previous. No hydrocephalus or ventricular trapping.   ECHOCARDIOGRAM COMPLETE 09/18/2019 1. Left ventricular ejection fraction, by estimation, is 55 to 60%. The left ventricle  has normal function. The left ventricle has no regional wall motion abnormalities. There is severe left ventricular hypertrophy. Left ventricular diastolic parameters  are indeterminate.  2. Right ventricular systolic function is normal. The right ventricular size is normal. Tricuspid regurgitation signal is inadequate for assessing PA pressure.  3. The mitral valve is grossly normal. Trivial mitral valve regurgitation.  4. The aortic valve is tricuspid. Aortic valve regurgitation is not visualized.  5. The inferior vena cava is normal in size with greater than 50% respiratory variability, suggesting right atrial pressure of 3 mmHg.   ECG - SR rate 78 BPM. LVH (See cardiology reading for complete details)  PHYSICAL EXAM  Temp:   [98 F (36.7 C)-99 F (37.2 C)] 98 F (36.7 C) (02/23 0800) Pulse Rate:  [54-74] 58 (02/23 0800) Resp:  [11-18] 16 (02/23 0800) BP: (137-165)/(82-100) 153/94 (02/23 0800) SpO2:  [92 %-97 %] 95 % (02/23 0800)  General - Well nourished, well developed, mildly lethargic.  Ophthalmologic - fundi not visualized due to noncooperation.  Cardiovascular - Regular rhythm and rate.  Mental Status -  Mildly lethargic but eyes open and orientation to time, place, and person were intact. Language including expression, naming, repetition, comprehension was assessed and found intact.  Mild dysarthria Fund of Knowledge was assessed and was intact.  Cranial Nerves II - XII - II - Visual field intact OU. III, IV, VI - Extraocular movements intact. V - Facial sensation symmetric. VII - left facial droop. VIII - Hearing & vestibular intact bilaterally. X - Palate elevates symmetrically. Mild dysarthria XI - Chin turning & shoulder shrug intact bilaterally. XII - Tongue protrusion intact.  Motor Strength - The patient's strength was normal in right upper and lower extremities, however, LUE 1/5 and LLE 1/5 with pain stimulation.  Bulk was normal and fasciculations were absent.   Motor Tone - Muscle tone was assessed at the neck and appendages and was normal.  Reflexes - The patient's reflexes were symmetrical in all extremities and he had no pathological reflexes.  Sensory - Light touch, temperature/pinprick were assessed and were diminished on the left    Coordination - The patient had normal movements in the right hand with no ataxia or dysmetria.  Tremor was absent.  Gait and Station - deferred.   ASSESSMENT/PLAN [name] is a 54 y.o. male with history of embolic stroke in June [id number], CHF, CKD stage 2, HTN and HLD and ongoing tobacco use and UDS + for cannabis presenting with SBP 240, right gaze preference, left hemiparesis, left hemianopia and left neglect syndrome. He did not receive IV  t-PA due to hemorrhage.  ICH - right thalamic ICH with IVH dilating the right temporal horn likely secondary to uncontrolled hypertension  Code Stroke CT Head - 10 cc right thalamic hematoma with intraventricular extension dilating the right temporal horn. 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   CTA H&N - "Spot sign" at the right thalamic hemorrhage. No underlying vascular malformation or aneurysm. Atherosclerosis with up to 50% stenosis at the left ICA bulb and left cavernous segment.  CT head repeat 2/21 showed stable ICH and IVH  CT repeat 2/23 no change in hemorrhage size but increased vasogenic edema w/ 54mm midline shift, slight increase IVH  EF 55-60%.   [place] [employer] Virus 2 - negative  LDL - 152  HgbA1c - 5.7  UDS - positive for THC  VTE prophylaxis - Heparin 5000 units sq tid d/c'd; continue SCDs  aspirin 81 mg daily and  clopidogrel 75 mg daily prior to admission, now on No antithrombotic due to [place]  Ongoing aggressive stroke risk factor management  Therapy recommendations: CIR  Disposition:  Pending  Cerebral Edema  CT head with increasing vasogenic edema, increasing midline shift.  Placed on 3% protocol @ 50/h   Na 138 -> 140  Goal Na 150-155  Na q 6h  History of stroke  12/2018 admitted for right-sided weakness.  MRI showed bilateral MCA punctate small infarct.  Carotid Doppler negative.  EF 60 to 65%.  TCD negative.  TEE unremarkable, no PFO.  LDL 85 A1c 5.7.  Hypercoagulable work-up negative.  DVT negative.  Had thrombocytosis at that time, however JAK2 mutation negative.  Discharged with aspirin Plavix for 3 months.  Hypertensive emergency  Home BP meds: Coreg ; Norvasc  Current BP meds: Cleviprex  Resume home Norvasc  D/c coreg due to bradycardia  Increase losartan 50 to bid  increase hydralazine 50->75 Q8 . SBP goal < 160 mm Hg  . Continue to Taper off cleviprex as able . Long-term BP goal normotensive  Hyperlipidemia  Home Lipid  lowering medication: Crestor 20 mg daily  LDL 152, goal < 70  Hold off crestor for now given ICH  Resume statin at discharge  Tobacco abuse  Current smoker  Smoking cessation counseling provided  Pt is willing to quit  Dysphagia . Secondary to stroke . Cleared for D1 nectar thick liqs . Speech on board   Other Stroke Risk Factors  ETOH use, advised to drink no more than 1 alcoholic beverage per day.  Congestive Heart Failure  Substance Abuse - positive for THC, educated on cessation  Other Active Problems  Code status - Limited  EKG with ST-T change, troponin negative, patient asymptomatic.  Hospital day # 3  This patient is critically ill due to [place] and IVH, hypertensive emergency, history of embolic strokes and at significant risk of neurological worsening, death form recurrent stroke, hematoma expansion, hydrocephalus, seizure, heart failure. This patient's care requires constant monitoring of vital signs, hemodynamics, respiratory and cardiac monitoring, review of multiple databases, neurological assessment, discussion with family, other specialists and medical decision making of high complexity. I spent 35 minutes of neurocritical care time in the care of this patient.    Rosalin Hawking, MD PhD Stroke Neurology 09/20/2019 11:36 AM    To contact Stroke Continuity provider, please refer to [email or web address] After hours, contact General Neurology

## 2019-09-20 NOTE — Progress Notes (Addendum)
Inpatient Rehabilitation Admissions Coordinator  I met with patient and his girlfriend at bedside, Anderson Malta. We discussed goals and expectations of an inpt rehab admit. Patient requests I contact his Mom and daughter also. I have left a message for his mom and will follow up on planning admit to inpt rehab when medically ready. I have contacted financial counselor , Janett Billow to request disability and medicaid applications. I will follow up tomorrow.  Danne Baxter, RN, MSN Rehab Admissions Coordinator [phone] 09/20/2019 1:45 PM   I spoke with pt's Mom by phone to answer her questions concerning an inpt rehab admit. I will contact his daughter, Joellen Jersey.

## 2019-09-21 DIAGNOSIS — E7800 Pure hypercholesterolemia, unspecified: Secondary | ICD-10-CM

## 2019-09-21 DIAGNOSIS — G936 Cerebral edema: Secondary | ICD-10-CM

## 2019-09-21 LAB — CBC
HCT: 48.5 % (ref 39.0–52.0)
Hemoglobin: 16.1 g/dL (ref 13.0–17.0)
MCH: 31 pg (ref 26.0–34.0)
MCHC: 33.2 g/dL (ref 30.0–36.0)
MCV: 93.3 fL (ref 80.0–100.0)
Platelets: 410 10*3/uL — ABNORMAL HIGH (ref 150–400)
RBC: 5.2 MIL/uL (ref 4.22–5.81)
RDW: 13.8 % (ref 11.5–15.5)
WBC: 10 10*3/uL (ref 4.0–10.5)
nRBC: 0 % (ref 0.0–0.2)

## 2019-09-21 LAB — BASIC METABOLIC PANEL
Anion gap: 10 (ref 5–15)
BUN: 13 mg/dL (ref 6–20)
CO2: 23 mmol/L (ref 22–32)
Calcium: 9.8 mg/dL (ref 8.9–10.3)
Chloride: 107 mmol/L (ref 98–111)
Creatinine, Ser: 0.94 mg/dL (ref 0.61–1.24)
GFR calc Af Amer: >60 mL/min (ref >60)
GFR calc non Af Amer: >60 mL/min (ref >60)
Glucose, Bld: 122 mg/dL — ABNORMAL HIGH (ref 70–99)
Potassium: 4.8 mmol/L (ref 3.5–5.1)
Sodium: 140 mmol/L (ref 135–145)

## 2019-09-21 LAB — SODIUM
Sodium: 140 mmol/L (ref 135–145)
Sodium: 139 mmol/L (ref 135–145)
Sodium: 146 mmol/L — ABNORMAL HIGH (ref 135–145)
Sodium: 146 mmol/L — ABNORMAL HIGH (ref 135–145)

## 2019-09-21 MED ORDER — HYDRALAZINE HCL 50 MG PO TABS
100.0000 mg | ORAL_TABLET | Freq: Three times a day (TID) | ORAL | Status: DC
  Administered 2019-09-21 – 2019-09-24 (×10): 100 mg via ORAL
  Filled 2019-09-21 (×10): qty 2

## 2019-09-21 MED ORDER — RESOURCE THICKENUP CLEAR PO POWD
ORAL | Status: DC | PRN
  Filled 2019-09-21: qty 125

## 2019-09-21 MED ORDER — CARVEDILOL 6.25 MG PO TABS
6.2500 mg | ORAL_TABLET | Freq: Two times a day (BID) | ORAL | Status: DC
  Administered 2019-09-21 – 2019-09-24 (×6): 6.25 mg via ORAL
  Filled 2019-09-21 (×2): qty 2
  Filled 2019-09-21: qty 1
  Filled 2019-09-21: qty 2
  Filled 2019-09-21: qty 1
  Filled 2019-09-21: qty 2

## 2019-09-21 NOTE — Progress Notes (Signed)
STROKE TEAM PROGRESS NOTE   INTERVAL HISTORY Patient sitting in bed, awake alert, still has left hemiplegia.  Still complains of headache, more on the right side of her head, also upset about his deficit.  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  OBJECTIVE Vitals:   09/21/19 0730 09/21/19 0800 09/21/19 0830 09/21/19 0900  BP: (!) 154/93 (!) 165/85 (!) 159/93 (!) 149/90  Pulse:      Resp: 15 (!) 9 13 16   Temp:  99.1 F (37.3 C)    TempSrc:  Axillary    SpO2:  95%    Weight:      Height:        CBC:  Recent Labs  Lab 09/17/19 1231 09/17/19 1239 09/20/19 0515 09/21/19 0457  WBC 7.4   < > 7.2 10.0  NEUTROABS 3.5  --   --   --   HGB 15.2   < > 14.8 16.1  HCT 45.4   < > 44.9 48.5  MCV 92.7   < > 92.6 93.3  PLT 412*   < > 382 410*   < > = values in this interval not displayed.    Basic Metabolic Panel:  Recent Labs  Lab 09/20/19 0515 09/20/19 1232 09/20/19 2313 09/21/19 0457  NA 138   < > 140 140  K 4.0  --   --  4.8  CL 102  --   --  107  CO2 26  --   --  23  GLUCOSE 110*  --   --  122*  BUN 12  --   --  13  CREATININE 0.92  --   --  0.94  CALCIUM 9.5  --   --  9.8   < > = values in this interval not displayed.   Lipid Panel:     Component Value Date/Time   CHOL 208 (H) 09/19/2019 0425   TRIG 197 (H) 09/19/2019 1214   HDL 31 (L) 09/19/2019 0425   CHOLHDL 6.7 09/19/2019 0425   VLDL 25 09/19/2019 0425   LDLCALC 152 (H) 09/19/2019 0425   HgbA1c:  Lab Results  Component Value Date   HGBA1C 6.2 (H) 09/19/2019   Urine Drug Screen:     Component Value Date/Time   LABOPIA NONE DETECTED 09/17/2019 1229   COCAINSCRNUR NONE DETECTED 09/17/2019 1229   LABBENZ NONE DETECTED 09/17/2019 1229   AMPHETMU NONE DETECTED 09/17/2019 1229   THCU POSITIVE (A) 09/17/2019 1229   LABBARB NONE DETECTED 09/17/2019 1229    Alcohol Level     Component Value Date/Time   ETH <10 09/17/2019 1229    IMAGING  CT HEAD CODE STROKE WO  CONTRAST 09/17/2019 IMPRESSION:  1. 10 cc right thalamic hematoma with intraventricular extension dilating the right temporal horn. Bleeds in this location are often hypertensive.  2. 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   CT Angio Head W or Wo Contrast CT ANGIO NECK W OR WO CONTRAST 09/17/2019 IMPRESSION:  1. "Spot sign" at the right thalamic hemorrhage, associated with progressive hemorrhages. No underlying vascular malformation or aneurysm.  2. Atherosclerosis with up to 50% stenosis at the left ICA bulb and left cavernous segment.  3. Left brachiocephalic vein stenosis.   CT HEAD WO CONTRAST 09/18/2019 Minimal increase of the right thalamic hematoma along the posterior margin. Intraventricular extension is unchanged and there is no ventriculomegaly.   CT HEAD WO CONTRAST 09/20/2019 1. No significant interval change in size of right  thalamic hemorrhage, but with increased localized vasogenic edema as compared to previous. Associated regional mass effect with up to 5 mm of localized right-to-left shift, slightly increased. 2. Associated intraventricular extension, with slightly increased intraventricular hemorrhage as compared to previous. No hydrocephalus or ventricular trapping.   CT HEAD WO CONTRAST 09/22/2019 pending   ECHOCARDIOGRAM COMPLETE 09/18/2019 1. Left ventricular ejection fraction, by estimation, is 55 to 60%. The left ventricle has normal function. The left ventricle has no regional wall motion abnormalities. There is severe left ventricular hypertrophy. Left ventricular diastolic parameters  are indeterminate.  2. Right ventricular systolic function is normal. The right ventricular size is normal. Tricuspid regurgitation signal is inadequate for assessing PA pressure.  3. The mitral valve is grossly normal. Trivial mitral valve regurgitation.  4. The aortic valve is tricuspid. Aortic valve regurgitation is not visualized.  5. The inferior vena cava is normal in size with  greater than 50% respiratory variability, suggesting right atrial pressure of 3 mmHg.   ECG - SR rate 78 BPM. LVH (See cardiology reading for complete details)   PHYSICAL EXAM   Temp:  [98.7 F (37.1 C)-99.3 F (37.4 C)] 99.1 F (37.3 C) (02/24 0800) Pulse Rate:  [58-81] 76 (02/24 0700) Resp:  [9-23] 16 (02/24 0900) BP: (140-175)/(80-104) 149/90 (02/24 0900) SpO2:  [90 %-100 %] 95 % (02/24 0800)  General - Well nourished, well developed, not in acute distress.  Ophthalmologic - fundi not visualized due to noncooperation.  Cardiovascular - Regular rhythm and rate.  Mental Status -  Awake alert, eyes open and orientation to time, place, and person were intact. Language including expression, naming, repetition, comprehension was assessed and found intact.  Mild to moderate dysarthria Fund of Knowledge was assessed and was intact.  Cranial Nerves II - XII - II - Visual field intact OU. III, IV, VI - Extraocular movements intact. V - Facial sensation symmetric. VII - left facial droop. VIII - Hearing & vestibular intact bilaterally. X - Palate elevates symmetrically. Mild to moderate dysarthria XI - Chin turning & shoulder shrug intact bilaterally. XII - Tongue protrusion intact.  Motor Strength - The patient's strength was normal in right upper and lower extremities, however, LUE 1/5 and LLE 1/5 with pain stimulation.  Bulk was normal and fasciculations were absent.   Motor Tone - Muscle tone was assessed at the neck and appendages and was normal.  Reflexes - The patient's reflexes were symmetrical in all extremities and he had no pathological reflexes.  Sensory - Light touch, temperature/pinprick were assessed and were diminished on the left    Coordination - The patient had normal movements in the right hand with no ataxia or dysmetria.  Tremor was absent.  Gait and Station - deferred.   ASSESSMENT/PLAN Mr. Duwayne Bohland is a 54 y.o. male with history of embolic stroke in  June [id number], CHF, CKD stage 2, HTN and HLD and ongoing tobacco use and UDS + for cannabis presenting with SBP 240, right gaze preference, left hemiparesis, left hemianopia and left neglect syndrome. He did not receive IV t-PA due to hemorrhage.  ICH - right thalamic ICH with IVH dilating the right temporal horn likely secondary to uncontrolled hypertension  Code Stroke CT Head - 10 cc right thalamic hematoma with intraventricular extension dilating the right temporal horn. 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   CTA H&N - "Spot sign" at the right thalamic hemorrhage. No underlying vascular malformation or aneurysm. Atherosclerosis with up to 50% stenosis at the left  ICA bulb and left cavernous segment.  CT head repeat 2/21 showed stable ICH and IVH  CT repeat 2/23 no change in hemorrhage size but increased vasogenic edema w/ 47mm midline shift, slight increase IVH  CT repeat 2/25 pending   EF 55-60%.   [employer] Virus 2 - negative  LDL - 152  HgbA1c - 5.7  UDS - positive for THC  VTE prophylaxis - Heparin 5000 units sq tid d/c'd; continue SCDs  aspirin 81 mg daily and clopidogrel 75 mg daily prior to admission, now on No antithrombotic due to [place]  Therapy recommendations: CIR  Disposition:  Pending  Cerebral Edema  CT head with increasing vasogenic edema, increasing midline shift.  Placed on 3% saline @ 50 ->75  Na 138 -> 140->140->140->139  Goal Na 150-155  Na q 6h  Consider to tape off 3% if CT repeat stable  History of stroke  12/2018 admitted for right-sided weakness.  MRI showed bilateral MCA punctate small infarct.  Carotid Doppler negative.  EF 60 to 65%.  TCD negative.  TEE unremarkable, no PFO.  LDL 85 A1c 5.7.  Hypercoagulable work-up negative.  DVT negative.  Had thrombocytosis at that time, however JAK2 mutation negative.  Discharged with aspirin Plavix for 3 months.  Hypertensive emergency  Home BP meds: Coreg ; Norvasc  Current BP meds:  Cleviprex  Resume home Norvasc 10  D/c coreg due to bradycardia -> HR improved, resume coreg 6.25 bid  On losartan 50 bid  increase hydralazine 50->75->100 Q8 . SBP goal < 160 mm Hg  . Continue to Taper off cleviprex as able . Long-term BP goal normotensive  Hyperlipidemia  Home Lipid lowering medication: Crestor 20 mg daily  LDL 152, goal < 70  Hold off crestor for now given ICH  Resume statin at discharge  Tobacco abuse  Current smoker  Smoking cessation counseling provided  Pt is willing to quit  Dysphagia . Secondary to stroke . Cleared for D1 nectar thick liqs . Speech on board   Other Stroke Risk Factors  ETOH use, advised to drink no more than 1 alcoholic beverage per day.  Congestive Heart Failure  Substance Abuse - positive for THC, educated on cessation  Other Active Problems  Code status - Limited  EKG with ST-T change, troponin negative, patient asymptomatic.  Hospital day # 4  This patient is critically ill due to [place] and IVH, hypertensive emergency, history of embolic strokes and at significant risk of neurological worsening, death form recurrent stroke, hematoma expansion, hydrocephalus, seizure, heart failure. This patient's care requires constant monitoring of vital signs, hemodynamics, respiratory and cardiac monitoring, review of multiple databases, neurological assessment, discussion with family, other specialists and medical decision making of high complexity. I spent 35 minutes of neurocritical care time in the care of this patient.    Rosalin Hawking, MD PhD Stroke Neurology 09/21/2019 10:50 AM    To contact Stroke Continuity provider, please refer to [email or web address] After hours, contact General Neurology
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
## 2019-09-21 NOTE — Progress Notes (Signed)
  Speech Language Pathology Treatment: Dysphagia  Patient Details Name: Paul [name] MRN: [id number] DOB: 04-03-1966 Today's Date: 09/21/2019 Time: [id number] SLP Time Calculation (min) (ACUTE ONLY): 21 min  Assessment / Plan / Recommendation Clinical Impression  Pt seen for swallow tx; observed with ice chips and thin liquids. Pt was able to follow commands, but used gestures over verbalizations, requiring cues to use words. Following cup sips and ice chips, pt noted with oral holding, immediate throat clearing and x1 delayed cough. As he utilized a spoon for sips, pt with more swift swallow and no significant s/sx of aspiration noted. Recommend small sips of water via spoon, in between or at least 30 minutes after offering other POs.   HPI HPI: Paul [name] is an 54 y.o. male with history of embolic stroke in June [id number], HTN and HLD and ongoing tobacco use and UDS + for cannabis. 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 He went to the ER triage at [facility] and a code stroke was activated. He was seen by tele-neurology emergently and CT of the head was showing a right thalamic hematoma with intraventricular extension dilating the right temporal horn.  He was transferred to [facility] for further neurologic care. Upon arrival he has right gaze preference, left hemiparesis and left hemianopia and left neglect syndrome.      SLP Plan  Continue with current plan of care       Recommendations  Diet recommendations: Dysphagia 1 (puree);Nectar-thick liquid;Other(comment)(free water protocol) Liquids provided via: Cup;Teaspoon Medication Administration: Whole meds with puree Supervision: Full supervision/cueing for compensatory strategies;Staff to assist with self feeding Compensations: Minimize environmental distractions;Slow rate;Small sips/bites;Lingual sweep for clearance of pocketing;Clear throat intermittently Postural Changes and/or Swallow  Maneuvers: Seated upright 90 degrees;Upright 30-60 min after meal                Oral Care Recommendations: Oral care prior to ice chip/H20 Follow up Recommendations: Inpatient Rehab SLP Visit Diagnosis: Dysphagia, oropharyngeal phase (R13.12) Plan: Continue with current plan of care       [place], Student SLP Office: [phone]  09/21/2019, 12:18 PM

## 2019-09-22 ENCOUNTER — Inpatient Hospital Stay (HOSPITAL_COMMUNITY): Payer: Medicaid Other

## 2019-09-22 LAB — BASIC METABOLIC PANEL
Anion gap: 8 (ref 5–15)
BUN: 12 mg/dL (ref 6–20)
CO2: 23 mmol/L (ref 22–32)
Calcium: 9.4 mg/dL (ref 8.9–10.3)
Chloride: 111 mmol/L (ref 98–111)
Creatinine, Ser: 0.89 mg/dL (ref 0.61–1.24)
GFR calc Af Amer: >60 mL/min (ref >60)
GFR calc non Af Amer: >60 mL/min (ref >60)
Glucose, Bld: 124 mg/dL — ABNORMAL HIGH (ref 70–99)
Potassium: 3.5 mmol/L (ref 3.5–5.1)
Sodium: 142 mmol/L (ref 135–145)

## 2019-09-22 LAB — CBC
HCT: 43.6 % (ref 39.0–52.0)
Hemoglobin: 14.3 g/dL (ref 13.0–17.0)
MCH: 30.5 pg (ref 26.0–34.0)
MCHC: 32.8 g/dL (ref 30.0–36.0)
MCV: 93 fL (ref 80.0–100.0)
Platelets: 365 10*3/uL (ref 150–400)
RBC: 4.69 MIL/uL (ref 4.22–5.81)
RDW: 14.2 % (ref 11.5–15.5)
WBC: 7.9 10*3/uL (ref 4.0–10.5)
nRBC: 0 % (ref 0.0–0.2)

## 2019-09-22 LAB — SODIUM
Sodium: 145 mmol/L (ref 135–145)
Sodium: 144 mmol/L (ref 135–145)
Sodium: 143 mmol/L (ref 135–145)

## 2019-09-22 MED ORDER — CLONIDINE HCL 0.1 MG PO TABS
0.1000 mg | ORAL_TABLET | Freq: Three times a day (TID) | ORAL | Status: DC
  Administered 2019-09-22 – 2019-09-24 (×7): 0.1 mg via ORAL
  Filled 2019-09-22 (×7): qty 1

## 2019-09-22 NOTE — Progress Notes (Signed)
Verbal order per Dr. Erlinda Hong to decrease 3% to 47mL/hr now and 9mL/hr this evening.

## 2019-09-22 NOTE — Progress Notes (Signed)
STROKE TEAM PROGRESS NOTE   INTERVAL HISTORY Pt RN at bedside. Pt sitting in bed, still has HA but tolerable. Still has left hemiplegia, left facial droop and dysarthria but no neuro change over night. Still on cleviprex. CT repeat stable and will taper off 3% saline.   Pt later put on chair by PT/OT, then he slipped from chair to ground. He nodded when asking whether hit his head. He was put back in chair.  Neuro stable, no chage.  OBJECTIVE Vitals:   09/22/19 0500 09/22/19 0600 09/22/19 0700 09/22/19 1037  BP: (!) 140/58 (!) 146/83 (!) 161/82 (!) 176/92  Pulse: 74 77 93 82  Resp: 15  17 18   Temp:      TempSrc:      SpO2: 98% 98% 96% 97%  Weight:      Height:        CBC:  Recent Labs  Lab 09/17/19 1231 09/17/19 1239 09/21/19 0457 09/22/19 0528  WBC 7.4   < > 10.0 7.9  NEUTROABS 3.5  --   --   --   HGB 15.2   < > 16.1 14.3  HCT 45.4   < > 48.5 43.6  MCV 92.7   < > 93.3 93.0  PLT 412*   < > 410* 365   < > = values in this interval not displayed.    Basic Metabolic Panel:  Recent Labs  Lab 09/21/19 0457 09/21/19 1054 09/21/19 2259 09/22/19 0528  NA 140   < > 146* 142  K 4.8  --   --  3.5  CL 107  --   --  111  CO2 23  --   --  23  GLUCOSE 122*  --   --  124*  BUN 13  --   --  12  CREATININE 0.94  --   --  0.89  CALCIUM 9.8  --   --  9.4   < > = values in this interval not displayed.   Lipid Panel:     Component Value Date/Time   CHOL 208 (H) 09/19/2019 0425   TRIG 197 (H) 09/19/2019 1214   HDL 31 (L) 09/19/2019 0425   CHOLHDL 6.7 09/19/2019 0425   VLDL 25 09/19/2019 0425   LDLCALC 152 (H) 09/19/2019 0425   HgbA1c:  Lab Results  Component Value Date   HGBA1C 6.2 (H) 09/19/2019   Urine Drug Screen:     Component Value Date/Time   LABOPIA NONE DETECTED 09/17/2019 1229   COCAINSCRNUR NONE DETECTED 09/17/2019 1229   LABBENZ NONE DETECTED 09/17/2019 1229   AMPHETMU NONE DETECTED 09/17/2019 1229   THCU POSITIVE (A) 09/17/2019 1229   LABBARB NONE  DETECTED 09/17/2019 1229    Alcohol Level     Component Value Date/Time   ETH <10 09/17/2019 1229    IMAGING  CT HEAD CODE STROKE WO CONTRAST 09/17/2019 IMPRESSION:  1. 10 cc right thalamic hematoma with intraventricular extension dilating the right temporal horn. Bleeds in this location are often hypertensive.  2. 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   CT Angio Head W or Wo Contrast CT ANGIO NECK W OR WO CONTRAST 09/17/2019 IMPRESSION:  1. "Spot sign" at the right thalamic hemorrhage, associated with progressive hemorrhages. No underlying vascular malformation or aneurysm.  2. Atherosclerosis with up to 50% stenosis at the left ICA bulb and left cavernous segment.  3. Left brachiocephalic vein stenosis.   CT HEAD WO CONTRAST 09/18/2019 Minimal increase of the right thalamic hematoma along  the posterior margin. Intraventricular extension is unchanged and there is no ventriculomegaly.   CT HEAD WO CONTRAST 09/20/2019 1. No significant interval change in size of right thalamic hemorrhage, but with increased localized vasogenic edema as compared to previous. Associated regional mass effect with up to 5 mm of localized right-to-left shift, slightly increased. 2. Associated intraventricular extension, with slightly increased intraventricular hemorrhage as compared to previous. No hydrocephalus or ventricular trapping.   CT HEAD WO CONTRAST 09/22/2019 Size stable right thalamic hematoma with rim of edema. Intraventricular clot is decreasing and there is no ventricular obstruction.   ECHOCARDIOGRAM COMPLETE 09/18/2019 1. Left ventricular ejection fraction, by estimation, is 55 to 60%. The left ventricle has normal function. The left ventricle has no regional wall motion abnormalities. There is severe left ventricular hypertrophy. Left ventricular diastolic parameters  are indeterminate.  2. Right ventricular systolic function is normal. The right ventricular size is normal. Tricuspid  regurgitation signal is inadequate for assessing PA pressure.  3. The mitral valve is grossly normal. Trivial mitral valve regurgitation.  4. The aortic valve is tricuspid. Aortic valve regurgitation is not visualized.  5. The inferior vena cava is normal in size with greater than 50% respiratory variability, suggesting right atrial pressure of 3 mmHg.   ECG - SR rate 78 BPM. LVH (See cardiology reading for complete details)   PHYSICAL EXAM    Temp:  [97.8 F (36.6 C)-99.4 F (37.4 C)] 98.6 F (37 C) (02/25 0400) Pulse Rate:  [73-93] 82 (02/25 1037) Resp:  [8-28] 18 (02/25 1037) BP: (126-176)/(58-112) 176/92 (02/25 1037) SpO2:  [94 %-100 %] 97 % (02/25 1037)  General - Well nourished, well developed, not in acute distress.  Ophthalmologic - fundi not visualized due to noncooperation.  Cardiovascular - Regular rhythm and rate.  Mental Status -  Awake alert, eyes open and orientation to time, place, and person were intact. Language including expression, naming, repetition, comprehension was assessed and found intact.  Mild to moderate dysarthria Fund of Knowledge was assessed and was intact.  Cranial Nerves II - XII - II - Visual field intact OU. III, IV, VI - Extraocular movements intact. V - Facial sensation symmetric. VII - left facial droop. VIII - Hearing & vestibular intact bilaterally. X - Palate elevates symmetrically. Mild to moderate dysarthria XI - Chin turning & shoulder shrug intact bilaterally. XII - Tongue protrusion intact.  Motor Strength - The patient's strength was normal in right upper and lower extremities, however, LUE 1/5 and LLE 1/5 with pain stimulation.  Bulk was normal and fasciculations were absent.   Motor Tone - Muscle tone was assessed at the neck and appendages and was normal.  Reflexes - The patient's reflexes were symmetrical in all extremities and he had no pathological reflexes.  Sensory - Light touch, temperature/pinprick were assessed and  were diminished on the left    Coordination - The patient had normal movements in the right hand with no ataxia or dysmetria.  Tremor was absent.  Gait and Station - deferred.   ASSESSMENT/PLAN Paul [name] is a 54 y.o. male with history of embolic stroke in June [id number], CHF, CKD stage 2, HTN and HLD and ongoing tobacco use and UDS + for cannabis presenting with SBP 240, right gaze preference, left hemiparesis, left hemianopia and left neglect syndrome. He did not receive IV t-PA due to hemorrhage.  ICH - right thalamic ICH with IVH dilating the right temporal horn likely secondary to uncontrolled hypertension  Code Stroke CT Head -  10 cc right thalamic hematoma with intraventricular extension dilating the right temporal horn. 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   CTA H&N - "Spot sign" at the right thalamic hemorrhage. No underlying vascular malformation or aneurysm. Atherosclerosis with up to 50% stenosis at the left ICA bulb and left cavernous segment.  CT head repeat 2/21 showed stable ICH and IVH  CT repeat 2/23 no change in hemorrhage size but increased vasogenic edema w/ 42mm midline shift, slight increase IVH  CT repeat 2/25 stable hemorrhage w/ rim of edema  EF 55-60%.   [employer] Virus 2 - negative  LDL - 152  HgbA1c - 5.7  UDS - positive for THC  VTE prophylaxis - Heparin 5000 units sq tid d/c'd; continue SCDs  aspirin 81 mg daily and clopidogrel 75 mg daily prior to admission, now on No antithrombotic due to [place]  Therapy recommendations: CIR  Disposition:  Pending  Cerebral Edema  CT head with increasing vasogenic edema, increasing midline shift.  CT head 2/25 - stable ICH with rim of edema  Placed on 3% saline @ 50 ->75  Na 138 -> 140->140->140->139->146->146->142   Goal Na 150-155  Na q 6h  Taper off 3% over 24 hours.   History of stroke  12/2018 admitted for right-sided weakness.  MRI showed bilateral MCA punctate small infarct.  Carotid  Doppler negative.  EF 60 to 65%.  TCD negative.  TEE unremarkable, no PFO.  LDL 85 A1c 5.7.  Hypercoagulable work-up negative.  DVT negative.  Had thrombocytosis at that time, however JAK2 mutation negative.  Discharged with aspirin Plavix for 3 months.  Hypertensive emergency  Home BP meds: Coreg ; Norvasc  Current BP meds: Cleviprex  Resume home Norvasc 10  D/c coreg due to bradycardia -> HR improved, resume home coreg 6.25 bid  On losartan 50 bid  increase hydralazine 50->75->100 Q8  Add clonidine 0.1mg  tid . SBP goal < 160 mm Hg  . Continue to Taper off cleviprex as able . Long-term BP goal normotensive  Hyperlipidemia  Home Lipid lowering medication: Crestor 20 mg daily  LDL 152, goal < 70  Hold off crestor for now given ICH  Resume statin at discharge  Tobacco abuse  Current smoker  Smoking cessation counseling provided  Pt is willing to quit  Dysphagia . Secondary to stroke . Cleared for D1 nectar thick liqs . Speech on board   Other Stroke Risk Factors  ETOH use, advised to drink no more than 1 alcoholic beverage per day.  Congestive Heart Failure  Substance Abuse - positive for THC, educated on cessation  Other Active Problems  Code status - Limited  EKG with ST-T change, troponin negative, patient asymptomatic.  Fall from chair 2/25 - neuro stable  Hospital day # 5  This patient is critically ill due to [place] and IVH, hypertensive emergency, history of embolic strokes and at significant risk of neurological worsening, death form recurrent stroke, hematoma expansion, hydrocephalus, seizure, heart failure. This patient's care requires constant monitoring of vital signs, hemodynamics, respiratory and cardiac monitoring, review of multiple databases, neurological assessment, discussion with family, other specialists and medical decision making of high complexity. I spent 35 minutes of neurocritical care time in the care of this patient.    Rosalin Hawking, MD PhD Stroke Neurology 09/22/2019 10:51 AM    To contact Stroke Continuity provider, please refer to [email or web address] After hours, contact General Neurology

## 2019-09-22 NOTE — Progress Notes (Signed)
Pt soiled pad under himself while in the chair. I had another nurse come to help me stand him and change the pad. I was cleaning urine from the floor and the other nurse left the room to take a phone call. I stepped into the bathroom to put away towels and the patient slipped from the chair to the floor.  Dr. Erlinda Hong was on the floor and notified immediately. He examined the patient and saw no need for new imaging or tests.  Patient remains neurologically stable. Will continue to monitor.
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
## 2019-09-22 NOTE — Progress Notes (Signed)
3% saline rate decreased to 64ml/hr per physician request.

## 2019-09-23 ENCOUNTER — Encounter (HOSPITAL_COMMUNITY): Payer: Self-pay | Admitting: Neurology

## 2019-09-23 LAB — SODIUM: Sodium: 144 mmol/L (ref 135–145)

## 2019-09-23 MED ORDER — LABETALOL HCL 5 MG/ML IV SOLN: 5.0000 mg | INTRAVENOUS | Status: DC | PRN
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
## 2019-09-23 NOTE — Social Work (Signed)
CSW received message from financial counselor that patient and family were not reachable to complete the Medicaid screening, asking who else should be contacted. CSW met with patient to discuss, and patient said he should be contacted. CSW verified with patient that he knew how to answer his room phone, as his cell phone is currently dead and not charging. CSW provided information to financial counselor to assist with screening process. Noting that plan is for CIR when medically stable, but CSW is available for any discharge needs that may arise if plans change.  Laveda Abbe, [place] Clinical Social Worker [phone]
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
## 2019-09-23 NOTE — Progress Notes (Signed)
Inpatient Rehabilitation Admissions Coordinator  Inpatient rehab bed is available for pt admit on Saturday. I have alerted Burnetta Sabin [facility] as well as patient and his daughter, Joellen Jersey. Dr. Naaman Plummer will see the patient in the morning for final approval and then 3 W  Nurse can call rehab charge nurse for report and to arrange admit at noon Saturday . Call [phone]. I will make th arrangements to admit Saturday.  Danne Baxter, RN, MSN Rehab Admissions Coordinator [phone] 09/23/2019 5:51 PM

## 2019-09-23 NOTE — Progress Notes (Signed)
STROKE TEAM PROGRESS NOTE   INTERVAL HISTORY PT at the bedside, patient sitting in chair, awake alert, interactive, still has left hemiplegia and left facial droop, but in the high spirit today.  OBJECTIVE Vitals:   09/23/19 0630 09/23/19 0700 09/23/19 0800 09/23/19 0830  BP: (!) 154/91 (!) 153/85 (!) 146/84 (!) 142/86  Pulse: 69 75 66 66  Resp: 18 18 18 16   Temp:      TempSrc:      SpO2: 96% 97% 96% 96%  Weight:      Height:        CBC:  Recent Labs  Lab 09/17/19 1231 09/17/19 1239 09/21/19 0457 09/22/19 0528  WBC 7.4   < > 10.0 7.9  NEUTROABS 3.5  --   --   --   HGB 15.2   < > 16.1 14.3  HCT 45.4   < > 48.5 43.6  MCV 92.7   < > 93.3 93.0  PLT 412*   < > 410* 365   < > = values in this interval not displayed.    Basic Metabolic Panel:  Recent Labs  Lab 09/21/19 0457 09/21/19 1054 09/22/19 0528 09/22/19 1101 09/22/19 2226 09/23/19 0641  NA 140   < > 142   < > 143 144  K 4.8  --  3.5  --   --   --   CL 107  --  111  --   --   --   CO2 23  --  23  --   --   --   GLUCOSE 122*  --  124*  --   --   --   BUN 13  --  12  --   --   --   CREATININE 0.94  --  0.89  --   --   --   CALCIUM 9.8  --  9.4  --   --   --    < > = values in this interval not displayed.   Lipid Panel:     Component Value Date/Time   CHOL 208 (H) 09/19/2019 0425   TRIG 197 (H) 09/19/2019 1214   HDL 31 (L) 09/19/2019 0425   CHOLHDL 6.7 09/19/2019 0425   VLDL 25 09/19/2019 0425   LDLCALC 152 (H) 09/19/2019 0425   HgbA1c:  Lab Results  Component Value Date   HGBA1C 6.2 (H) 09/19/2019   Urine Drug Screen:     Component Value Date/Time   LABOPIA NONE DETECTED 09/17/2019 1229   COCAINSCRNUR NONE DETECTED 09/17/2019 1229   LABBENZ NONE DETECTED 09/17/2019 1229   AMPHETMU NONE DETECTED 09/17/2019 1229   THCU POSITIVE (A) 09/17/2019 1229   LABBARB NONE DETECTED 09/17/2019 1229    Alcohol Level     Component Value Date/Time   ETH <10 09/17/2019 1229    IMAGING  CT HEAD CODE  STROKE WO CONTRAST 09/17/2019 IMPRESSION:  1. 10 cc right thalamic hematoma with intraventricular extension dilating the right temporal horn. Bleeds in this location are often hypertensive.  2. 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   CT Angio Head W or Wo Contrast CT ANGIO NECK W OR WO CONTRAST 09/17/2019 IMPRESSION:  1. "Spot sign" at the right thalamic hemorrhage, associated with progressive hemorrhages. No underlying vascular malformation or aneurysm.  2. Atherosclerosis with up to 50% stenosis at the left ICA bulb and left cavernous segment.  3. Left brachiocephalic vein stenosis.   CT HEAD WO CONTRAST 09/18/2019 Minimal increase of the right thalamic hematoma  along the posterior margin. Intraventricular extension is unchanged and there is no ventriculomegaly.   CT HEAD WO CONTRAST 09/20/2019 1. No significant interval change in size of right thalamic hemorrhage, but with increased localized vasogenic edema as compared to previous. Associated regional mass effect with up to 5 mm of localized right-to-left shift, slightly increased. 2. Associated intraventricular extension, with slightly increased intraventricular hemorrhage as compared to previous. No hydrocephalus or ventricular trapping.   CT HEAD WO CONTRAST 09/22/2019 Size stable right thalamic hematoma with rim of edema. Intraventricular clot is decreasing and there is no ventricular obstruction.   ECHOCARDIOGRAM COMPLETE 09/18/2019 1. Left ventricular ejection fraction, by estimation, is 55 to 60%. The left ventricle has normal function. The left ventricle has no regional wall motion abnormalities. There is severe left ventricular hypertrophy. Left ventricular diastolic parameters  are indeterminate.  2. Right ventricular systolic function is normal. The right ventricular size is normal. Tricuspid regurgitation signal is inadequate for assessing PA pressure.  3. The mitral valve is grossly normal. Trivial mitral valve  regurgitation.  4. The aortic valve is tricuspid. Aortic valve regurgitation is not visualized.  5. The inferior vena cava is normal in size with greater than 50% respiratory variability, suggesting right atrial pressure of 3 mmHg.   ECG - SR rate 78 BPM. LVH (See cardiology reading for complete details)   PHYSICAL EXAM  Temp:  [98.7 F (37.1 C)-98.9 F (37.2 C)] 98.8 F (37.1 C) (02/26 0400) Pulse Rate:  [58-86] 66 (02/26 0830) Resp:  [10-31] 16 (02/26 0830) BP: (142-180)/(72-103) 142/86 (02/26 0830) SpO2:  [94 %-98 %] 96 % (02/26 0830)  General - Well nourished, well developed, not in acute distress.  Ophthalmologic - fundi not visualized due to noncooperation.  Cardiovascular - Regular rhythm and rate.  Mental Status -  Awake alert, eyes open and orientation to time, place, and person were intact. Language including expression, naming, repetition, comprehension was assessed and found intact.  Mild to moderate dysarthria Fund of Knowledge was assessed and was intact.  Cranial Nerves II - XII - II - Visual field intact OU. III, IV, VI - Extraocular movements intact. V - Facial sensation symmetric. VII - left facial droop. VIII - Hearing & vestibular intact bilaterally. X - Palate elevates symmetrically. Mild to moderate dysarthria XI - Chin turning & shoulder shrug intact bilaterally. XII - Tongue protrusion intact.  Motor Strength - The patient's strength was normal in right upper and lower extremities, however, LUE 1/5 and LLE 1/5 with pain stimulation.  Bulk was normal and fasciculations were absent.   Motor Tone - Muscle tone was assessed at the neck and appendages and was normal.  Reflexes - The patient's reflexes were symmetrical in all extremities and he had no pathological reflexes.  Sensory - Light touch, temperature/pinprick were assessed and were diminished on the left    Coordination - The patient had normal movements in the right hand with no ataxia or  dysmetria.  Tremor was absent.  Gait and Station - deferred.   ASSESSMENT/PLAN Paul [name] is a 54 y.o. male with history of embolic stroke in June [id number], CHF, CKD stage 2, HTN and HLD and ongoing tobacco use and UDS + for cannabis presenting with SBP 240, right gaze preference, left hemiparesis, left hemianopia and left neglect syndrome. He did not receive IV t-PA due to hemorrhage.  ICH - right thalamic ICH with IVH dilating the right temporal horn likely secondary to uncontrolled hypertension  Code Stroke CT Head -  10 cc right thalamic hematoma with intraventricular extension dilating the right temporal horn. 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   CTA H&N - "Spot sign" at the right thalamic hemorrhage. No underlying vascular malformation or aneurysm. Atherosclerosis with up to 50% stenosis at the left ICA bulb and left cavernous segment.  CT head repeat 2/21 showed stable ICH and IVH  CT repeat 2/23 no change in hemorrhage size but increased vasogenic edema w/ 25mm midline shift, slight increase IVH  CT repeat 2/25 stable hemorrhage w/ rim of edema  EF 55-60%.   [employer] Virus 2 - negative  LDL - 152  HgbA1c - 5.7  UDS - positive for THC  VTE prophylaxis - Heparin 5000 units sq tid d/c'd; continue SCDs  aspirin 81 mg daily and clopidogrel 75 mg daily prior to admission, now on No antithrombotic due to [place]  Therapy recommendations: CIR  Disposition:  Pending  Medically ready for transfer to CIR - admission planned to CIR for Sat 2/27  Cerebral Edema  CT head with increasing vasogenic edema, increasing midline shift.  CT head 2/25 - stable ICH with rim of edema  Placed on 3% saline @ 50 ->75 -> 40- > 20 -> off  Na 144-143-144  Na monitoring daily  History of stroke  12/2018 admitted for right-sided weakness.  MRI showed bilateral MCA punctate small infarct.  Carotid Doppler negative.  EF 60 to 65%.  TCD negative.  TEE unremarkable, no PFO.  LDL 85 A1c 5.7.   Hypercoagulable work-up negative.  DVT negative.  Had thrombocytosis at that time, however JAK2 mutation negative.  Discharged with aspirin Plavix for 3 months.  Hypertensive emergency  Home BP meds: Coreg ; Norvasc  Current BP meds: Cleviprex  Resume home Norvasc 10  D/c coreg due to bradycardia -> HR improved, resume home coreg 6.25 bid  On losartan 50 bid  increase hydralazine 50->75->100 Q8h  On clonidine 0.1mg  tid . Increase SBP goal < 180 mm Hg  . off cleviprex this am . labetalol prn  . Long-term BP goal normotensive  Hyperlipidemia  Home Lipid lowering medication: Crestor 20 mg daily  LDL 152, goal < 70  Hold off crestor for now given ICH  Resume statin at discharge  Tobacco abuse  Current smoker  Smoking cessation counseling provided  Pt is willing to quit  Dysphagia . Secondary to stroke . Cleared for D1 nectar thick liqs . Speech on board   Other Stroke Risk Factors  ETOH use, advised to drink no more than 1 alcoholic beverage per day.  Congestive Heart Failure  Substance Abuse - positive for THC, educated on cessation  Other Active Problems  Code status - Limited  EKG with ST-T change, troponin negative, patient asymptomatic.  Fall from chair 2/25 - neuro stable  Hospital day # 6   This patient is critically ill due to [place] and IVH, hypertensive emergency, history of embolic strokes and at significant risk of neurological worsening, death form recurrent stroke, hematoma expansion, hydrocephalus, seizure, heart failure. This patient's care requires constant monitoring of vital signs, hemodynamics, respiratory and cardiac monitoring, review of multiple databases, neurological assessment, discussion with family, other specialists and medical decision making of high complexity. I spent 30 minutes of neurocritical care time in the care of this patient.     Rosalin Hawking, MD PhD Stroke Neurology 09/23/2019 11:28 AM    To contact Stroke  Continuity provider, please refer to [email or web address] After hours, contact General Neurology

## 2019-09-23 NOTE — Progress Notes (Signed)
  Speech Language Pathology Treatment: Dysphagia;Cognitive-Linquistic  Patient Details Name: Paul [name] MRN: [id number] DOB: 1966/07/08 Today's Date: 09/23/2019 Time: [id number] SLP Time Calculation (min) (ACUTE ONLY): 27 min  Assessment / Plan / Recommendation Clinical Impression  Pt observed with breakfast tray (dys 2/NTL) in upright position. Pt noted to have L pocketing, as seen in previous sessions. He continues to say he is aware that he's pocketing food, but requires Min-Mod cues to utilize oral swab and attend to removing pocketed food. No s/sx of aspiration noted following dys 2/NTL trials. His frustration threshold and problem solving skills have improved, requiring only Min cues during functional tasks throughout session.    HPI HPI: Paul [name] is an 54 y.o. male with history of embolic stroke in June [id number], HTN and HLD and ongoing tobacco use and UDS + for cannabis. 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 He went to the ER triage at [facility] and a code stroke was activated. He was seen by tele-neurology emergently and CT of the head was showing a right thalamic hematoma with intraventricular extension dilating the right temporal horn.  He was transferred to [facility] for further neurologic care. Upon arrival he has right gaze preference, left hemiparesis and left hemianopia and left neglect syndrome.      SLP Plan  Continue with current plan of care       Recommendations  Diet recommendations: Nectar-thick liquid;Dysphagia 2 (fine chop) Liquids provided via: Cup Medication Administration: Crushed with puree Supervision: Full supervision/cueing for compensatory strategies Compensations: Minimize environmental distractions;Slow rate;Small sips/bites;Lingual sweep for clearance of pocketing Postural Changes and/or Swallow Maneuvers: Seated upright 90 degrees;Upright 30-60 min after meal                Oral Care  Recommendations: Oral care prior to ice chip/H20 Follow up Recommendations: Inpatient Rehab SLP Visit Diagnosis: Dysphagia, oropharyngeal phase (R13.12);Cognitive communication deficit (R41.841) Plan: Continue with current plan of care       [place], Student SLP Office: [phone]  09/23/2019, 11:11 AM
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
## 2019-09-23 NOTE — Plan of Care (Signed)
  Problem: Education: Goal: Knowledge of patient specific risk factors addressed and post discharge goals established will improve Outcome: Progressing   Problem: Education: Goal: Individualized Educational Video(s) Outcome: Progressing   Problem: Education: Goal: Knowledge of disease or condition will improve Outcome: Progressing

## 2019-09-24 ENCOUNTER — Encounter (HOSPITAL_COMMUNITY): Payer: Self-pay | Admitting: Neurology

## 2019-09-24 ENCOUNTER — Other Ambulatory Visit: Payer: Self-pay

## 2019-09-24 ENCOUNTER — Inpatient Hospital Stay (HOSPITAL_COMMUNITY)
Admission: RE | Admit: 2019-09-24 | Discharge: 2019-10-21 | DRG: 057 | Disposition: A | Discharge status: Home / Self Care | Payer: Medicaid Other | Source: Intra-hospital | Attending: Physical Medicine & Rehabilitation | Admitting: Physical Medicine & Rehabilitation

## 2019-09-24 DIAGNOSIS — K5901 Slow transit constipation: Secondary | ICD-10-CM | POA: Diagnosis not present

## 2019-09-24 DIAGNOSIS — G441 Vascular headache, not elsewhere classified: Secondary | ICD-10-CM | POA: Diagnosis present

## 2019-09-24 DIAGNOSIS — I69112 Visuospatial deficit and spatial neglect following nontraumatic intracerebral hemorrhage: Secondary | ICD-10-CM

## 2019-09-24 DIAGNOSIS — F329 Major depressive disorder, single episode, unspecified: Secondary | ICD-10-CM

## 2019-09-24 DIAGNOSIS — R339 Retention of urine, unspecified: Secondary | ICD-10-CM | POA: Diagnosis not present

## 2019-09-24 DIAGNOSIS — M545 Low back pain, unspecified: Secondary | ICD-10-CM

## 2019-09-24 DIAGNOSIS — Z801 Family history of malignant neoplasm of trachea, bronchus and lung: Secondary | ICD-10-CM

## 2019-09-24 DIAGNOSIS — Z79899 Other long term (current) drug therapy: Secondary | ICD-10-CM | POA: Diagnosis not present

## 2019-09-24 DIAGNOSIS — R414 Neurologic neglect syndrome: Secondary | ICD-10-CM | POA: Diagnosis present

## 2019-09-24 DIAGNOSIS — I69119 Unspecified symptoms and signs involving cognitive functions following nontraumatic intracerebral hemorrhage: Secondary | ICD-10-CM

## 2019-09-24 DIAGNOSIS — Z7902 Long term (current) use of antithrombotics/antiplatelets: Secondary | ICD-10-CM

## 2019-09-24 DIAGNOSIS — M549 Dorsalgia, unspecified: Secondary | ICD-10-CM | POA: Diagnosis present

## 2019-09-24 DIAGNOSIS — E785 Hyperlipidemia, unspecified: Secondary | ICD-10-CM | POA: Diagnosis present

## 2019-09-24 DIAGNOSIS — G4700 Insomnia, unspecified: Secondary | ICD-10-CM | POA: Diagnosis not present

## 2019-09-24 DIAGNOSIS — F1290 Cannabis use, unspecified, uncomplicated: Secondary | ICD-10-CM | POA: Diagnosis present

## 2019-09-24 DIAGNOSIS — I69122 Dysarthria following nontraumatic intracerebral hemorrhage: Secondary | ICD-10-CM | POA: Diagnosis not present

## 2019-09-24 DIAGNOSIS — I610 Nontraumatic intracerebral hemorrhage in hemisphere, subcortical: Secondary | ICD-10-CM | POA: Diagnosis present

## 2019-09-24 DIAGNOSIS — Z823 Family history of stroke: Secondary | ICD-10-CM

## 2019-09-24 DIAGNOSIS — I5032 Chronic diastolic (congestive) heart failure: Secondary | ICD-10-CM | POA: Diagnosis present

## 2019-09-24 DIAGNOSIS — G936 Cerebral edema: Secondary | ICD-10-CM

## 2019-09-24 DIAGNOSIS — F4320 Adjustment disorder, unspecified: Secondary | ICD-10-CM | POA: Diagnosis present

## 2019-09-24 DIAGNOSIS — R7303 Prediabetes: Secondary | ICD-10-CM | POA: Diagnosis present

## 2019-09-24 DIAGNOSIS — I10 Essential (primary) hypertension: Secondary | ICD-10-CM | POA: Diagnosis present

## 2019-09-24 DIAGNOSIS — R001 Bradycardia, unspecified: Secondary | ICD-10-CM | POA: Diagnosis not present

## 2019-09-24 DIAGNOSIS — I130 Hypertensive heart and chronic kidney disease with heart failure and stage 1 through stage 4 chronic kidney disease, or unspecified chronic kidney disease: Secondary | ICD-10-CM | POA: Diagnosis present

## 2019-09-24 DIAGNOSIS — F17210 Nicotine dependence, cigarettes, uncomplicated: Secondary | ICD-10-CM | POA: Diagnosis present

## 2019-09-24 DIAGNOSIS — Z7982 Long term (current) use of aspirin: Secondary | ICD-10-CM

## 2019-09-24 DIAGNOSIS — I69154 Hemiplegia and hemiparesis following nontraumatic intracerebral hemorrhage affecting left non-dominant side: Principal | ICD-10-CM

## 2019-09-24 DIAGNOSIS — E7800 Pure hypercholesterolemia, unspecified: Secondary | ICD-10-CM | POA: Diagnosis present

## 2019-09-24 DIAGNOSIS — Z9049 Acquired absence of other specified parts of digestive tract: Secondary | ICD-10-CM

## 2019-09-24 DIAGNOSIS — I69191 Dysphagia following nontraumatic intracerebral hemorrhage: Secondary | ICD-10-CM

## 2019-09-24 DIAGNOSIS — N182 Chronic kidney disease, stage 2 (mild): Secondary | ICD-10-CM | POA: Diagnosis present

## 2019-09-24 DIAGNOSIS — N179 Acute kidney failure, unspecified: Secondary | ICD-10-CM | POA: Diagnosis not present

## 2019-09-24 DIAGNOSIS — R1312 Dysphagia, oropharyngeal phase: Secondary | ICD-10-CM | POA: Diagnosis present

## 2019-09-24 DIAGNOSIS — I6523 Occlusion and stenosis of bilateral carotid arteries: Secondary | ICD-10-CM | POA: Diagnosis present

## 2019-09-24 DIAGNOSIS — R32 Unspecified urinary incontinence: Secondary | ICD-10-CM | POA: Diagnosis not present

## 2019-09-24 DIAGNOSIS — Z833 Family history of diabetes mellitus: Secondary | ICD-10-CM | POA: Diagnosis not present

## 2019-09-24 DIAGNOSIS — I69391 Dysphagia following cerebral infarction: Secondary | ICD-10-CM

## 2019-09-24 LAB — BASIC METABOLIC PANEL
Anion gap: 10 (ref 5–15)
BUN: 27 mg/dL — ABNORMAL HIGH (ref 6–20)
CO2: 25 mmol/L (ref 22–32)
Calcium: 9.7 mg/dL (ref 8.9–10.3)
Chloride: 106 mmol/L (ref 98–111)
Creatinine, Ser: 0.98 mg/dL (ref 0.61–1.24)
GFR calc Af Amer: >60 mL/min (ref >60)
GFR calc non Af Amer: >60 mL/min (ref >60)
Glucose, Bld: 116 mg/dL — ABNORMAL HIGH (ref 70–99)
Potassium: 3.5 mmol/L (ref 3.5–5.1)
Sodium: 141 mmol/L (ref 135–145)

## 2019-09-24 LAB — CBC
HCT: 39.6 % (ref 39.0–52.0)
Hemoglobin: 13 g/dL (ref 13.0–17.0)
MCH: 30.7 pg (ref 26.0–34.0)
MCHC: 32.8 g/dL (ref 30.0–36.0)
MCV: 93.6 fL (ref 80.0–100.0)
Platelets: 349 10*3/uL (ref 150–400)
RBC: 4.23 MIL/uL (ref 4.22–5.81)
RDW: 14.4 % (ref 11.5–15.5)
WBC: 9.8 10*3/uL (ref 4.0–10.5)
nRBC: 0 % (ref 0.0–0.2)

## 2019-09-24 LAB — GLUCOSE, CAPILLARY
Glucose-Capillary: 116 mg/dL — ABNORMAL HIGH (ref 70–99)
Glucose-Capillary: 152 mg/dL — ABNORMAL HIGH (ref 70–99)

## 2019-09-24 MED ORDER — DIPHENHYDRAMINE HCL 12.5 MG/5ML PO ELIX: 12.5000 mg | ORAL_SOLUTION | Freq: Four times a day (QID) | ORAL | Status: DC | PRN

## 2019-09-24 MED ORDER — CHLORHEXIDINE GLUCONATE 0.12 % MT SOLN
15.0000 mL | Freq: Two times a day (BID) | OROMUCOSAL | Status: DC
  Administered 2019-09-24 – 2019-10-18 (×47): 15 mL via OROMUCOSAL
  Filled 2019-09-24 (×42): qty 15

## 2019-09-24 MED ORDER — PROCHLORPERAZINE 25 MG RE SUPP: 12.5000 mg | Freq: Four times a day (QID) | RECTAL | Status: DC | PRN

## 2019-09-24 MED ORDER — BUTALBITAL-APAP-CAFFEINE 50-325-40 MG PO TABS
1.0000 | ORAL_TABLET | Freq: Three times a day (TID) | ORAL | Status: DC | PRN
  Administered 2019-09-24 – 2019-10-21 (×49): 1 via ORAL
  Filled 2019-09-24 (×51): qty 1

## 2019-09-24 MED ORDER — BISACODYL 10 MG RE SUPP
10.0000 mg | Freq: Once | RECTAL | Status: AC
  Administered 2019-09-24: 12:00:00 10 mg via RECTAL
  Filled 2019-09-24: qty 1

## 2019-09-24 MED ORDER — BISACODYL 10 MG RE SUPP
10.0000 mg | Freq: Every day | RECTAL | Status: DC | PRN
  Administered 2019-09-28 – 2019-10-18 (×3): 10 mg via RECTAL
  Filled 2019-09-24 (×4): qty 1

## 2019-09-24 MED ORDER — POLYETHYLENE GLYCOL 3350 17 G PO PACK
17.0000 g | PACK | Freq: Every day | ORAL | Status: DC | PRN
  Administered 2019-09-27 – 2019-10-19 (×6): 17 g via ORAL
  Filled 2019-09-24 (×6): qty 1

## 2019-09-24 MED ORDER — RESOURCE THICKENUP CLEAR PO POWD
ORAL | Status: DC | PRN
  Filled 2019-09-24 (×2): qty 125

## 2019-09-24 MED ORDER — SENNOSIDES-DOCUSATE SODIUM 8.6-50 MG PO TABS
1.0000 | ORAL_TABLET | Freq: Two times a day (BID) | ORAL | Status: DC
  Administered 2019-09-24 – 2019-10-18 (×48): 1 via ORAL
  Filled 2019-09-24 (×48): qty 1

## 2019-09-24 MED ORDER — ORAL CARE MOUTH RINSE
15.0000 mL | Freq: Two times a day (BID) | OROMUCOSAL | Status: DC
  Administered 2019-09-24 – 2019-10-20 (×43): 15 mL via OROMUCOSAL

## 2019-09-24 MED ORDER — TOPIRAMATE 25 MG PO TABS
25.0000 mg | ORAL_TABLET | Freq: Every day | ORAL | Status: DC
  Administered 2019-09-24: 25 mg via ORAL
  Filled 2019-09-24: qty 1

## 2019-09-24 MED ORDER — FLEET ENEMA 7-19 GM/118ML RE ENEM: 1.0000 | ENEMA | Freq: Once | RECTAL | Status: DC | PRN

## 2019-09-24 MED ORDER — CLONIDINE HCL 0.1 MG PO TABS
0.1000 mg | ORAL_TABLET | Freq: Three times a day (TID) | ORAL | Status: DC
  Administered 2019-09-24 – 2019-10-21 (×80): 0.1 mg via ORAL
  Filled 2019-09-24 (×80): qty 1

## 2019-09-24 MED ORDER — LOSARTAN POTASSIUM 50 MG PO TABS
50.0000 mg | ORAL_TABLET | Freq: Two times a day (BID) | ORAL | Status: DC
  Administered 2019-09-24 – 2019-10-08 (×28): 50 mg via ORAL
  Filled 2019-09-24 (×28): qty 1

## 2019-09-24 MED ORDER — ALUM & MAG HYDROXIDE-SIMETH 200-200-20 MG/5ML PO SUSP
30.0000 mL | ORAL | Status: DC | PRN
  Filled 2019-09-24: qty 30

## 2019-09-24 MED ORDER — INSULIN ASPART 100 UNIT/ML ~~LOC~~ SOLN
0.0000 [IU] | Freq: Three times a day (TID) | SUBCUTANEOUS | Status: DC
  Administered 2019-09-27 – 2019-09-29 (×4): 1 [IU] via SUBCUTANEOUS

## 2019-09-24 MED ORDER — PANTOPRAZOLE SODIUM 40 MG PO TBEC
40.0000 mg | DELAYED_RELEASE_TABLET | Freq: Every day | ORAL | Status: DC
  Administered 2019-09-25 – 2019-10-21 (×27): 40 mg via ORAL
  Filled 2019-09-24 (×27): qty 1

## 2019-09-24 MED ORDER — TRAMADOL HCL 50 MG PO TABS
50.0000 mg | ORAL_TABLET | Freq: Four times a day (QID) | ORAL | Status: DC | PRN
  Administered 2019-09-24 – 2019-10-21 (×50): 50 mg via ORAL
  Filled 2019-09-24 (×52): qty 1

## 2019-09-24 MED ORDER — CARVEDILOL 6.25 MG PO TABS
6.2500 mg | ORAL_TABLET | Freq: Two times a day (BID) | ORAL | Status: DC
  Administered 2019-09-24 – 2019-10-21 (×54): 6.25 mg via ORAL
  Filled 2019-09-24 (×54): qty 1

## 2019-09-24 MED ORDER — ACETAMINOPHEN 325 MG PO TABS
325.0000 mg | ORAL_TABLET | ORAL | Status: DC | PRN
  Administered 2019-09-26 – 2019-10-18 (×41): 650 mg via ORAL
  Administered 2019-10-19: 325 mg via ORAL
  Administered 2019-10-20: 650 mg via ORAL
  Filled 2019-09-24 (×43): qty 2

## 2019-09-24 MED ORDER — PROCHLORPERAZINE EDISYLATE 10 MG/2ML IJ SOLN: 5.0000 mg | Freq: Four times a day (QID) | INTRAMUSCULAR | Status: DC | PRN

## 2019-09-24 MED ORDER — HYDRALAZINE HCL 50 MG PO TABS
100.0000 mg | ORAL_TABLET | Freq: Three times a day (TID) | ORAL | Status: DC
  Administered 2019-09-24 – 2019-10-21 (×80): 100 mg via ORAL
  Filled 2019-09-24 (×80): qty 2

## 2019-09-24 MED ORDER — TOPIRAMATE 25 MG PO TABS: 25.0000 mg | ORAL_TABLET | Freq: Two times a day (BID) | ORAL | Status: DC

## 2019-09-24 MED ORDER — AMLODIPINE BESYLATE 10 MG PO TABS
10.0000 mg | ORAL_TABLET | Freq: Every day | ORAL | Status: DC
  Administered 2019-09-25 – 2019-10-05 (×11): 10 mg via ORAL
  Filled 2019-09-24 (×11): qty 1

## 2019-09-24 MED ORDER — INSULIN ASPART 100 UNIT/ML ~~LOC~~ SOLN: 0.0000 [IU] | Freq: Every day | SUBCUTANEOUS | Status: DC

## 2019-09-24 MED ORDER — ADULT MULTIVITAMIN W/MINERALS CH
1.0000 | ORAL_TABLET | Freq: Every day | ORAL | Status: DC
  Administered 2019-09-25 – 2019-10-21 (×27): 1 via ORAL
  Filled 2019-09-24 (×28): qty 1

## 2019-09-24 MED ORDER — TRAZODONE HCL 50 MG PO TABS
25.0000 mg | ORAL_TABLET | Freq: Every evening | ORAL | Status: DC | PRN
  Administered 2019-09-24 – 2019-10-20 (×9): 50 mg via ORAL
  Filled 2019-09-24 (×10): qty 1

## 2019-09-24 MED ORDER — PROCHLORPERAZINE MALEATE 5 MG PO TABS
5.0000 mg | ORAL_TABLET | Freq: Four times a day (QID) | ORAL | Status: DC | PRN
  Filled 2019-09-24: qty 1

## 2019-09-24 MED ORDER — GUAIFENESIN-DM 100-10 MG/5ML PO SYRP: 5.0000 mL | ORAL_SOLUTION | Freq: Four times a day (QID) | ORAL | Status: DC | PRN
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
## 2019-09-24 NOTE — Discharge Summary (Signed)
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                 recent 01/19/2019. CHF; Risk Factors:Hypertension, Dyslipidemia                 and Current Smoker. CKD.  Sonographer:    Clayton Lefort RDCS (AE) Referring Phys: [id number] [place]  1. Left ventricular ejection fraction, by estimation, is 55 to 60%. The left ventricle has normal function. The left ventricle has no regional wall motion abnormalities. There is severe left ventricular hypertrophy. Left ventricular diastolic parameters  are indeterminate.  2. Right ventricular systolic function is normal. The right ventricular size is normal. Tricuspid regurgitation signal is inadequate for assessing PA pressure.  3. The mitral valve is grossly normal. Trivial mitral valve regurgitation.  4. The aortic valve is tricuspid. Aortic valve regurgitation is not visualized.  5. The inferior vena cava is normal in size with greater than 50% respiratory variability, suggesting right atrial pressure of 3 mmHg. FINDINGS  Left Ventricle: Left ventricular ejection fraction, by estimation, is 55 to 60%. The left ventricle has normal function. The left ventricle has no regional wall motion abnormalities. The left ventricular internal cavity size was normal in size. There is  severe left ventricular hypertrophy. Left ventricular diastolic parameters are indeterminate. Right  Ventricle: The right ventricular size is normal. No increase in right ventricular wall thickness. Right ventricular systolic function is normal. Tricuspid regurgitation signal is inadequate for assessing PA pressure. Left Atrium: Left atrial size was normal in size. Right Atrium: Right atrial size was normal in size. Pericardium: There is no evidence of pericardial effusion. Mitral Valve: The mitral valve is grossly normal. Trivial mitral valve regurgitation. Tricuspid Valve: The tricuspid valve is grossly normal. Tricuspid valve regurgitation is trivial. Aortic Valve: The aortic valve is tricuspid. Aortic valve regurgitation is not  visualized. Aortic valve mean gradient measures 8.0 mmHg. Aortic valve peak gradient measures 11.8 mmHg. Aortic valve area, by VTI measures 1.73 cm. Pulmonic Valve: The pulmonic valve was grossly normal. Pulmonic valve regurgitation is not visualized. Aorta: The aortic root is normal in size and structure. Venous: The inferior vena cava is normal in size with greater than 50% respiratory variability, suggesting right atrial pressure of 3 mmHg. IAS/Shunts: No atrial level shunt detected by color flow Doppler.  LEFT VENTRICLE PLAX 2D LVIDd:         4.30 cm  Diastology LVIDs:         3.30 cm  LV e' lateral:   7.94 cm/s LV PW:         1.90 cm  LV E/e' lateral: 10.4 LV IVS:        1.60 cm  LV e' medial:    5.11 cm/s LVOT diam:     1.90 cm  LV E/e' medial:  16.1 LV SV:         64.36 ml LV SV Index:   32.37 LVOT Area:     2.84 cm  RIGHT VENTRICLE             IVC RV Basal diam:  3.10 cm     IVC diam: 1.70 cm RV S prime:     16.20 cm/s TAPSE (M-mode): 2.1 cm LEFT ATRIUM             Index       RIGHT ATRIUM           Index LA diam:        3.50 cm 1.76 cm/m  RA Area:     14.40 cm LA Vol (A2C):   68.1 ml 34.25 ml/m RA Volume:   35.70 ml  17.95 ml/m LA Vol (A4C):   37.0 ml 18.61 ml/m LA Biplane Vol: 52.2 ml 26.25 ml/m  AORTIC VALVE AV Area (Vmax):    1.86 cm AV Area (Vmean):   1.45 cm AV  Area (VTI):     1.73 cm AV Vmax:           172.00 cm/s AV Vmean:          136.000 cm/s AV VTI:            0.372 m AV Peak Grad:      11.8 mmHg AV Mean Grad:      8.0 mmHg LVOT Vmax:         113.00 cm/s LVOT Vmean:        69.700 cm/s LVOT VTI:          0.227 m LVOT/AV VTI ratio: 0.61  AORTA Ao Root diam: 3.30 cm MITRAL VALVE MV Area (PHT): 2.80 cm    SHUNTS MV Decel Time: 271 msec    Systemic VTI:  0.23 m MV E velocity: 82.30 cm/s  Systemic Diam: 1.90 cm MV A velocity: 48.40 cm/s MV E/A ratio:  1.70 Rozann Lesches MD Electronically signed by Rozann Lesches MD Signature Date/Time: 09/18/2019/5:25:10 PM    Final    CT HEAD CODE STROKE WO CONTRAST  Result Date: 09/17/2019 CLINICAL DATA:  Code stroke.  Left-sided weakness and lost speech EXAM: CT HEAD WITHOUT CONTRAST TECHNIQUE: Contiguous axial images were obtained from the base of the skull through the vertex without intravenous contrast. COMPARISON:  01/17/2019 FINDINGS: Brain: 2.8 x 2.4 x 3 cm hematoma at the right thalamus with decompression into the right lateral ventricle. Clot tracks to the body and  into the temporal horn of the right lateral ventricle which is mildly dilated. Chronic lacune is at the corpus callosum. No mass or extra-axial collection Vascular: Normal flow voids Skull: Negative Sinuses/Orbits: Negative Other: Critical Value/emergent results were called by telephone at the time of interpretation on 09/17/2019 at 12:49 pm to provider [facility] , who is already aware. CTA is already ordered. ASPECTS [facility] Stroke Program Early CT Score) Not scored in this setting IMPRESSION: 1. 10 cc right thalamic hematoma with intraventricular extension dilating the right temporal horn. Bleeds in this location are often hypertensive. 2. 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 Electronically Signed   By: Monte Fantasia M.D.   On: 09/17/2019 12:51      HISTORY OF PRESENT ILLNESS (From Dr Cecil Cobbs H&P on 09/17/19) Rileigh Dudenhoeffer is an 54 y.o. male  with history of embolic stroke in June [id number], HTN and HLD and ongoing tobacco use and UDS + for cannabis. 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 He went to the ER triage at [facility] and a code stroke was activated. He was seen by tele-neurology emergently and CTH showed a Rt thalamic hemorrhage with IVH and spot sign on CTA. He was transferred to [facility] for further neurologic care. Upon arrival he has right gaze preference, left hemiparesis and left hemianopia and left neglect syndrome. Cleviprex IV started for SBP <160 goal. GCS is 15. ICH volume is 10cc.  ICH score: 1 for IVH mRS: 0   HOSPITAL COURSE [name] is a 54 y.o. male with history of embolic stroke in June [id number], CHF, CKD stage 2, HTN and HLD and ongoing tobacco use and UDS + for cannabis presenting with SBP 240, right gaze preference, left hemiparesis, left hemianopia and left neglect syndrome. He did not receive IV t-PA due to hemorrhage.  ICH - right thalamic ICH with IVH dilating the right temporal horn likely secondary to uncontrolled hypertension  Code Stroke CT Head - 10 cc right thalamic hematoma with intraventricular extension dilating the right temporal horn. 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   CTA H&N - "Spot sign" at the right thalamic hemorrhage. No underlying vascular malformation or aneurysm. Atherosclerosis with up to 50% stenosis at the left ICA bulb and left cavernous segment.  CT head repeat 2/21 showed stable ICH and IVH  CT repeat 2/23 no change in hemorrhage size but increased vasogenic edema w/ 88mm midline shift, slight increase IVH  CT repeat 2/25 stable hemorrhage w/ rim of edema  EF 55-60%.   [employer] Virus 2 - negative  LDL - 152  HgbA1c - 5.7  UDS - positive for THC  VTE prophylaxis - Heparin 5000 units sq tid d/c'd; continue SCDs  aspirin 81 mg daily and clopidogrel 75 mg daily prior to admission, now on No antithrombotic due to  [place]  Therapy recommendations: CIR  Disposition:  Pending  Medically ready for transfer to CIR - admission planned to CIR for Sat 2/27  Cerebral Edema  CT head with increasing vasogenic edema, increasing midline shift.  CT head 2/25 - stable ICH with rim of edema  Placed on 3% saline @ 50 ->75 -> 40- > 20 -> off  Na 144-143-144  Na monitoring daily  History of stroke  12/2018 admitted for right-sided weakness.  MRI showed bilateral MCA punctate small infarct.  Carotid Doppler negative.  EF 60 to 65%.  TCD negative.  TEE unremarkable, no PFO.  LDL  85 A1c 5.7.  Hypercoagulable work-up negative.  DVT negative.  Had thrombocytosis at that time, however JAK2 mutation negative.  Discharged with aspirin Plavix for 3 months.  Hypertensive emergency  Home BP meds: Coreg ; Norvasc  Current BP meds: Cleviprex  Resume home Norvasc 10  D/c coreg due to bradycardia -> HR improved, resume home coreg 6.25 bid  On losartan 50 bid  increase hydralazine 50->75->100 Q8h  On clonidine 0.1mg  tid  Increase SBP goal < 180 mm Hg   off cleviprex this am  labetalol prn   Long-term BP goal normotensive  Hyperlipidemia  Home Lipid lowering medication: Crestor 20 mg daily  LDL 152, goal < 70  Hold off crestor for now given ICH  Resume statin at discharge  Tobacco abuse  Current smoker  Smoking cessation counseling provided  Pt is willing to quit  Dysphagia  Secondary to stroke  Cleared for D1 nectar thick liqs  Speech on board   Other Stroke Risk Factors  ETOH use, advised to drink no more than 1 alcoholic beverage per day.  Congestive Heart Failure  Substance Abuse - positive for THC, educated on cessation  Other Active Problems  Code status - Limited  EKG with ST-T change, troponin negative, patient asymptomatic.  Fall from chair 2/25 - neuro stable  Bradycardia  RN Pressure Injury Documentation:     Malnutrition Type:  Nutrition Problem:  Inadequate oral intake Etiology: dysphagia   Malnutrition Characteristics:  Signs/Symptoms: meal completion < 50%(limited diet)   Nutrition Interventions:  Interventions: Magic cup, Hormel Shake  DISCHARGE EXAM Vitals:   09/23/19 2304 09/24/19 0028 09/24/19 0401 09/24/19 0740  BP: (!) 169/91 130/80 (!) 156/94 (!) 148/78  Pulse: (!) 51 (!) 52 (!) 54 (!) 55  Resp: 16 16 18 16   Temp: 98.1 F (36.7 C) 98 F (36.7 C) 98.5 F (36.9 C) 98.8 F (37.1 C)  TempSrc: Oral Oral Oral Oral  SpO2: 99% 97% 97% 99%  Weight:      Height:       General - Well nourished, well developed, not in acute distress.  Ophthalmologic - fundi not visualized due to noncooperation.  Cardiovascular - Regular rhythm and rate.  Mental Status -  Awake alert, eyes open and orientation to time, place, and person were intact. Language including expression, naming, repetition, comprehension was assessed and found intact.  Mild to moderate dysarthria Fund of Knowledge was assessed and was intact.  Cranial Nerves II - XII - II - Visual field intact OU. III, IV, VI - Extraocular movements intact. V - Facial sensation symmetric. VII - left facial droop. VIII - Hearing & vestibular intact bilaterally. X - Palate elevates symmetrically. Mild to moderate dysarthria XI - Chin turning & shoulder shrug intact bilaterally. XII - Tongue protrusion intact.  Motor Strength - The patient's strength was normal in right upper and lower extremities, however, LUE 1/5 and LLE 1/5 with pain stimulation.  Bulk was normal and fasciculations were absent.   Motor Tone - Muscle tone was assessed at the neck and appendages and was normal.  Reflexes - The patient's reflexes were symmetrical in all extremities and he had no pathological reflexes.  Sensory - Light touch, temperature/pinprick were assessed and were diminished on the left    Coordination - The patient had normal movements in the right hand with no ataxia or  dysmetria.  Tremor was absent.  Gait and Station - deferred.  Discharge Diet   Diet Order  DIET DYS 2 Room service appropriate? Yes; Fluid consistency: Nectar Thick  Diet effective now             liquids  DISCHARGE PLAN  Disposition:  Discharge to [facility] for ongoing PT, OT and ST  No antithrombotic for secondary stroke prevention due to [place]  Recommend ongoing risk factor control by Primary Care Physician at time of discharge from inpatient rehabilitation.  Follow-up Patient, No Pcp Per in 2 weeks following discharge from rehab.  Follow-up in [facility] in 4 weeks following discharge from rehab, office to schedule an appointment.   30 minutes were spent preparing discharge.  Mikey Bussing PA-C [facility] Pager [phone] 09/24/2019, 4:58 PM

## 2019-09-24 NOTE — Plan of Care (Signed)
  Problem: Education: Goal: Knowledge of disease or condition will improve Outcome: Completed/Met Goal: Knowledge of secondary prevention will improve Outcome: Completed/Met Goal: Knowledge of patient specific risk factors addressed and post discharge goals established will improve Outcome: Completed/Met Goal: Individualized Educational Video(s) Outcome: Completed/Met   Problem: Coping: Goal: Will verbalize positive feelings about self Outcome: Completed/Met Goal: Will identify appropriate support needs Outcome: Completed/Met   Problem: Health Behavior/Discharge Planning: Goal: Ability to manage health-related needs will improve Outcome: Completed/Met   Problem: Self-Care: Goal: Ability to participate in self-care as condition permits will improve Outcome: Completed/Met Goal: Verbalization of feelings and concerns over difficulty with self-care will improve Outcome: Completed/Met Goal: Ability to communicate needs accurately will improve Outcome: Completed/Met   Problem: Nutrition: Goal: Risk of aspiration will decrease Outcome: Completed/Met Goal: Dietary intake will improve Outcome: Completed/Met   Problem: Intracerebral Hemorrhage Tissue Perfusion: Goal: Complications of Intracerebral Hemorrhage will be minimized Outcome: Completed/Met   Problem: SLP Cognition Goals Goal: Patient will utilize external memory aids Description: Patient will utilize external memory aids to facilitate recall of information for improved safety with Outcome: Completed/Met Goal: Patient will demonstrate awareness during Description: Patient will demonstrate awareness during functional ADL for improved safety Outcome: Completed/Met   Problem: SLP Language Goals Goal: Patient will utilize speech intelligibility Description: Patient will utilize speech intelligibility strategies to  enhance communication with Outcome: Completed/Met   Problem: SLP Dysphagia Goals Goal: Misc Dysphagia  Goal Outcome: Completed/Met   Problem: Acute Rehab PT Goals(only PT should resolve) Goal: Pt Will Go Supine/Side To Sit Outcome: Completed/Met Goal: Pt Will Go Sit To Supine/Side Outcome: Completed/Met Goal: Patient Will Transfer Sit To/From Stand Outcome: Completed/Met Goal: Pt Will Transfer Bed To Chair/Chair To Bed Outcome: Completed/Met Goal: Pt Will Ambulate Outcome: Completed/Met   Problem: Acute Rehab OT Goals (only OT should resolve) Goal: Pt. Will Perform Eating Outcome: Completed/Met Goal: Pt. Will Perform Grooming Outcome: Completed/Met Goal: Pt. Will Perform Upper Body Dressing Outcome: Completed/Met Goal: Pt. Will Transfer To Toilet Outcome: Completed/Met Goal: OT Additional ADL Goal #1 Outcome: Completed/Met Goal: OT Additional ADL Goal #2 Outcome: Completed/Met   Problem: Inadequate Intake (NI-2.1) Goal: Food and/or nutrient delivery Description: Individualized approach for food/nutrient provision. Outcome: Completed/Met

## 2019-09-24 NOTE — Plan of Care (Signed)
  Problem: Consults Goal: RH GENERAL PATIENT EDUCATION Description: See Patient Education module for education specifics. Outcome: Progressing Goal: Skin Care Protocol Initiated - if Braden Score 18 or less Description: If consults are not indicated, leave blank or document N/A Outcome: Progressing Goal: Nutrition Consult-if indicated Outcome: Progressing Goal: Diabetes Guidelines if Diabetic/Glucose > 140 Description: If diabetic or lab glucose is > 140 mg/dl - Initiate Diabetes/Hyperglycemia Guidelines & Document Interventions  Outcome: Progressing   Problem: RH BOWEL ELIMINATION Goal: RH STG MANAGE BOWEL WITH ASSISTANCE Description: STG Manage Bowel with Assistance. Outcome: Progressing Goal: RH STG MANAGE BOWEL W/MEDICATION W/ASSISTANCE Description: STG Manage Bowel with Medication with Assistance. Outcome: Progressing   Problem: RH BLADDER ELIMINATION Goal: RH STG MANAGE BLADDER WITH ASSISTANCE Description: STG Manage Bladder With Assistance Outcome: Progressing Goal: RH STG MANAGE BLADDER WITH MEDICATION WITH ASSISTANCE Description: STG Manage Bladder With Medication With Assistance. Outcome: Progressing Goal: RH STG MANAGE BLADDER WITH EQUIPMENT WITH ASSISTANCE Description: STG Manage Bladder With Equipment With Assistance Outcome: Progressing   Problem: RH SKIN INTEGRITY Goal: RH STG SKIN FREE OF INFECTION/BREAKDOWN Outcome: Progressing Goal: RH STG MAINTAIN SKIN INTEGRITY WITH ASSISTANCE Description: STG Maintain Skin Integrity With Assistance. Outcome: Progressing Goal: RH STG ABLE TO PERFORM INCISION/WOUND CARE W/ASSISTANCE Description: STG Able To Perform Incision/Wound Care With Assistance. Outcome: Progressing   Problem: RH SAFETY Goal: RH STG ADHERE TO SAFETY PRECAUTIONS W/ASSISTANCE/DEVICE Description: STG Adhere to Safety Precautions With Assistance/Device. Outcome: Progressing Goal: RH STG DECREASED RISK OF FALL WITH ASSISTANCE Description: STG  Decreased Risk of Fall With Assistance. Outcome: Progressing   Problem: RH KNOWLEDGE DEFICIT GENERAL Goal: RH STG INCREASE KNOWLEDGE OF SELF CARE AFTER HOSPITALIZATION Outcome: Progressing   Problem: RH Vision Goal: RH LTG Vision (Specify) Outcome: Progressing   Problem: RH Pre-functional/Other (Specify) Goal: RH LTG Pre-functional (Specify) Outcome: Progressing Goal: RH LTG Interdisciplinary (Specify) 1 Description: RH LTG Interdisciplinary (Specify)1 Outcome: Progressing Goal: RH LTG Interdisciplinary (Specify) 2 Description: RH LTG Interdisciplinary (Specify) 2  Outcome: Progressing

## 2019-09-24 NOTE — Progress Notes (Signed)
Discussed discharge to CIR with patient, who is eager to begin re-hab.  States he feels as if he is not progressing at all.  Responds to positive reinforcement as CIR was discussed with him.  Also discussed purpose of re-hab and their structure.  Report called to [place], Therapist, sports.

## 2019-09-24 NOTE — Progress Notes (Signed)
Orthopedic Tech Progress Note Patient Details:  Paul [name] Jun 14, 1966 [id number] Called in orders to [facility] for a RESTING WHO and PRAFO BOOT .Marland Kitchen Patient ID: [name], male   DOB: [birth year], 54 y.o.   MRN: [id number]   [name] 09/24/2019, 4:29 PM

## 2019-09-25 ENCOUNTER — Inpatient Hospital Stay (HOSPITAL_COMMUNITY): Payer: Medicaid Other

## 2019-09-25 ENCOUNTER — Inpatient Hospital Stay (HOSPITAL_COMMUNITY): Payer: Self-pay

## 2019-09-25 ENCOUNTER — Encounter (HOSPITAL_COMMUNITY): Payer: Self-pay

## 2019-09-25 ENCOUNTER — Inpatient Hospital Stay (HOSPITAL_COMMUNITY): Payer: Self-pay | Admitting: Speech Pathology

## 2019-09-25 ENCOUNTER — Inpatient Hospital Stay (HOSPITAL_COMMUNITY): Payer: Self-pay | Admitting: Occupational Therapy

## 2019-09-25 DIAGNOSIS — E119 Type 2 diabetes mellitus without complications: Secondary | ICD-10-CM

## 2019-09-25 DIAGNOSIS — I610 Nontraumatic intracerebral hemorrhage in hemisphere, subcortical: Secondary | ICD-10-CM

## 2019-09-25 LAB — GLUCOSE, CAPILLARY
Glucose-Capillary: 111 mg/dL — ABNORMAL HIGH (ref 70–99)
Glucose-Capillary: 113 mg/dL — ABNORMAL HIGH (ref 70–99)
Glucose-Capillary: 113 mg/dL — ABNORMAL HIGH (ref 70–99)
Glucose-Capillary: 125 mg/dL — ABNORMAL HIGH (ref 70–99)

## 2019-09-25 MED ORDER — TOPIRAMATE 25 MG PO TABS
25.0000 mg | ORAL_TABLET | Freq: Two times a day (BID) | ORAL | Status: DC
  Administered 2019-09-25 – 2019-09-26 (×3): 25 mg via ORAL
  Filled 2019-09-25 (×3): qty 1
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
## 2019-09-25 NOTE — Progress Notes (Signed)
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 to Reach Goals Cognitive deficits Barriers/Prognosis Comment -- [facility] IP DIET RECOMMENDATION 09/18/2019 SLP Diet Recommendations Nectar thick liquid;Dysphagia 1 (Puree) solids Liquid Administration via Cup Medication Administration Whole meds with puree Compensations Slow rate;Small sips/bites Postural Changes Seated upright at 90 degrees   [facility] IP OTHER RECOMMENDATIONS 09/18/2019 Recommended Consults -- Oral Care Recommendations Oral care BID Other Recommendations Have oral suction available;Prohibited food (jello, ice cream, thin soups)   [facility] IP FOLLOW UP RECOMMENDATIONS 09/18/2019 Follow up Recommendations Inpatient Rehab   [facility] IP FREQUENCY AND DURATION 09/18/2019 Speech Therapy Frequency (ACUTE ONLY) min 2x/week Treatment Duration 2 weeks      [facility] IP ORAL PHASE 09/18/2019 Oral Phase Impaired Oral - Pudding Teaspoon -- Oral - Pudding Cup -- Oral - Honey Teaspoon -- Oral - Honey Cup -- Oral - Nectar Teaspoon -- Oral - Nectar Cup Delayed oral transit;Premature spillage;Holding of bolus;Left anterior bolus loss Oral - Nectar Straw -- Oral - Thin Teaspoon Left anterior bolus loss;Delayed oral transit;Premature spillage Oral -  Thin Cup Delayed oral transit;Premature spillage;Left anterior bolus loss Oral - Thin Straw -- Oral -  Puree Delayed oral transit;Premature spillage Oral - Mech Soft -- Oral - Regular -- Oral - Multi-Consistency Delayed oral transit;Premature spillage Oral - Pill -- Oral Phase - Comment --  [facility] IP PHARYNGEAL PHASE 09/18/2019 Pharyngeal Phase Impaired Pharyngeal- Pudding Teaspoon -- Pharyngeal -- Pharyngeal- Pudding Cup -- Pharyngeal -- Pharyngeal- Honey Teaspoon -- Pharyngeal -- Pharyngeal- Honey Cup -- Pharyngeal -- Pharyngeal- Nectar Teaspoon -- Pharyngeal -- Pharyngeal- Nectar Cup Delayed swallow initiation-pyriform sinuses;Reduced epiglottic inversion;Reduced anterior laryngeal mobility;Reduced laryngeal elevation Pharyngeal -- Pharyngeal- Nectar Straw -- Pharyngeal -- Pharyngeal- Thin Teaspoon Delayed swallow initiation-pyriform sinuses;Reduced epiglottic inversion;Reduced anterior laryngeal mobility;Reduced laryngeal elevation Pharyngeal -- Pharyngeal- Thin Cup Delayed swallow initiation-pyriform sinuses;Reduced epiglottic inversion;Reduced anterior laryngeal mobility;Reduced laryngeal elevation;Reduced airway/laryngeal closure;Penetration/Aspiration during swallow;Moderate aspiration Pharyngeal Material enters airway, passes BELOW cords and not ejected out despite cough attempt by patient Pharyngeal- Thin Straw -- Pharyngeal -- Pharyngeal- Puree Delayed swallow initiation-vallecula;Reduced epiglottic inversion;Reduced anterior laryngeal mobility;Reduced laryngeal elevation Pharyngeal -- Pharyngeal- Mechanical Soft -- Pharyngeal -- Pharyngeal- Regular -- Pharyngeal -- Pharyngeal- Multi-consistency Delayed swallow initiation-vallecula;Reduced epiglottic inversion;Reduced anterior laryngeal mobility;Reduced laryngeal elevation Pharyngeal -- Pharyngeal- Pill -- Pharyngeal -- Pharyngeal Comment --  [facility] IP CERVICAL ESOPHAGEAL PHASE 09/18/2019 Cervical Esophageal Phase WFL Pudding Teaspoon -- Pudding Cup -- Honey  Teaspoon -- Honey Cup -- Nectar Teaspoon -- Nectar Cup -- Nectar Straw -- Thin Teaspoon -- Thin Cup -- Thin Straw -- Puree -- Mechanical Soft -- Regular -- Multi-consistency -- Pill -- Cervical Esophageal Comment -- Shelly Flatten, MA, CCC-SLP Acute Rehab SLP [phone] Paul [name] 09/18/2019, 12:02 PM               ECHOCARDIOGRAM COMPLETE   Result Date: 09/18/2019    ECHOCARDIOGRAM REPORT   Patient Name:   Paul [name] Date of Exam: 09/18/2019 Medical Rec #:  [id number]  Height:       72.0 in Accession #:    [id number] Weight:       170.0 lb Date of Birth:  07-19-1966  BSA:          1.988 m Patient Age:    72 years   BP:           136/72 mmHg Patient Gender: M          HR:           67 bpm. Exam Location:  Inpatient Procedure: 2D Echo, Cardiac Doppler and Color Doppler Indications:    Stroke 434.91 / I163.9  History:        Patient has prior history of Echocardiogram examinations, most                 recent 01/19/2019. CHF; Risk Factors:Hypertension, Dyslipidemia                 and Current Smoker. CKD.  Sonographer:    Clayton Lefort RDCS (AE) Referring Phys: [id number] [place]  1. Left ventricular ejection fraction, by estimation, is 55 to 60%. The left ventricle has normal function. The left ventricle has no regional wall motion abnormalities. There is severe left ventricular hypertrophy. Left ventricular diastolic parameters  are indeterminate.  2. Right ventricular systolic function is normal. The right ventricular size is normal. Tricuspid regurgitation signal is inadequate for assessing PA pressure.  3. The mitral valve is grossly normal. Trivial mitral valve regurgitation.  4. The aortic valve is tricuspid. Aortic valve regurgitation is not visualized.  5. The inferior vena cava is normal in size with greater than 50% respiratory variability, suggesting right atrial  pressure of 3 mmHg. FINDINGS  Left Ventricle: Left ventricular ejection fraction, by estimation, is 55 to 60%. The left ventricle  has normal function. The left ventricle has no regional wall motion abnormalities. The left ventricular internal cavity size was normal in size. There is  severe left ventricular hypertrophy. Left ventricular diastolic parameters are indeterminate. Right Ventricle: The right ventricular size is normal. No increase in right ventricular wall thickness. Right ventricular systolic function is normal. Tricuspid regurgitation signal is inadequate for assessing PA pressure. Left Atrium: Left atrial size was normal in size. Right Atrium: Right atrial size was normal in size. Pericardium: There is no evidence of pericardial effusion. Mitral Valve: The mitral valve is grossly normal. Trivial mitral valve regurgitation. Tricuspid Valve: The tricuspid valve is grossly normal. Tricuspid valve regurgitation is trivial. Aortic Valve: The aortic valve is tricuspid. Aortic valve regurgitation is not visualized. Aortic valve mean gradient measures 8.0 mmHg. Aortic valve peak gradient measures 11.8 mmHg. Aortic valve area, by VTI measures 1.73 cm. Pulmonic Valve: The pulmonic valve was grossly normal. Pulmonic valve regurgitation is not visualized. Aorta: The aortic root is normal in size and structure. Venous: The inferior vena cava is normal in size with greater than 50% respiratory variability, suggesting right atrial pressure of 3 mmHg. IAS/Shunts: No atrial level shunt detected by color flow Doppler.  LEFT VENTRICLE PLAX 2D LVIDd:         4.30 cm  Diastology LVIDs:         3.30 cm  LV e' lateral:   7.94 cm/s LV PW:         1.90 cm  LV E/e' lateral: 10.4 LV IVS:        1.60 cm  LV e' medial:    5.11 cm/s LVOT diam:     1.90 cm  LV E/e' medial:  16.1 LV SV:         64.36 ml LV SV Index:   32.37 LVOT Area:     2.84 cm  RIGHT VENTRICLE             IVC RV Basal diam:  3.10 cm     IVC diam: 1.70 cm RV S prime:     16.20 cm/s TAPSE (M-mode): 2.1 cm LEFT ATRIUM             Index       RIGHT ATRIUM           Index LA diam:        3.50  cm 1.76 cm/m  RA Area:     14.40 cm LA Vol (A2C):   68.1 ml 34.25 ml/m RA Volume:   35.70 ml  17.95 ml/m LA Vol (A4C):   37.0 ml 18.61 ml/m LA Biplane Vol: 52.2 ml 26.25 ml/m  AORTIC VALVE AV Area (Vmax):    1.86 cm AV Area (Vmean):   1.45 cm AV Area (VTI):     1.73 cm AV Vmax:           172.00 cm/s AV Vmean:          136.000 cm/s AV VTI:            0.372 m AV Peak Grad:      11.8 mmHg AV Mean Grad:      8.0 mmHg LVOT Vmax:         113.00 cm/s LVOT Vmean:        69.700 cm/s LVOT VTI:  0.227 m LVOT/AV VTI ratio: 0.61  AORTA Ao Root diam: 3.30 cm MITRAL VALVE MV Area (PHT): 2.80 cm    SHUNTS MV Decel Time: 271 msec    Systemic VTI:  0.23 m MV E velocity: 82.30 cm/s  Systemic Diam: 1.90 cm MV A velocity: 48.40 cm/s MV E/A ratio:  1.70 Rozann Lesches MD Electronically signed by Rozann Lesches MD Signature Date/Time: 09/18/2019/5:25:10 PM    Final          Assessment/Plan: Diagnosis: right thalamic hemorrhage with IV extension, dense left hemiparesis and inattention 1. Does the need for close, 24 hr/day medical supervision in concert with the patient's rehab needs make it unreasonable for this patient to be served in a less intensive setting? Yes 2. Co-Morbidities requiring supervision/potential complications: CKD II, dCHF, HTN, polysubstance abuse 3. Due to bladder management, bowel management, safety, skin/wound care, disease management, medication administration, pain management and patient education, does the patient require 24 hr/day rehab nursing? Yes 4. Does the patient require coordinated care of a physician, rehab nurse, therapy disciplines of PT, OT, SLP to address physical and functional deficits in the context of the above medical diagnosis(es)? Yes Addressing deficits in the following areas: balance, endurance, locomotion, strength, transferring, bowel/bladder control, bathing, dressing, feeding, grooming, toileting, cognition, speech and psychosocial support 5. Can the patient  actively participate in an intensive therapy program of at least 3 hrs of therapy per day at least 5 days per week? Yes 6. The potential for patient to  7. make measurable gains while on inpatient rehab is excellent 8. Anticipated functional outcomes upon discharge from inpatient rehab are supervision and min assist  with PT, supervision and min assist with OT, modified independent and supervision with SLP. 9. Estimated rehab length of stay to reach the above functional goals is: 18-24 days 10. Anticipated discharge destination: Home 11. Overall Rehab/Functional Prognosis: excellent   RECOMMENDATIONS: This patient's condition is appropriate for continued rehabilitative care in the following setting: CIR Patient has agreed to participate in recommended program. Yes Note that insurance prior authorization may be required for reimbursement for recommended care.   Comment:  Recommend PRAFO, WHO LLE and LUE respectively.    Rehab Admissions Coordinator to follow up.   Thanks,   Meredith Staggers, MD, Mellody Drown   I have personally performed a face to face diagnostic evaluation of this patient. Additionally, I have examined pertinent labs and radiographic images. I have reviewed and concur with the physician assistant's documentation above.     Lavon Paganini Angiulli, PA-C 09/19/2019        Revision History                     Routing History

## 2019-09-25 NOTE — Plan of Care (Signed)
  Problem: RH Balance Goal: LTG: Patient will maintain dynamic sitting balance (OT) Description: LTG:  Patient will maintain dynamic sitting balance with assistance during activities of daily living (OT) Flowsheets (Taken 09/25/2019 1701) LTG: Pt will maintain dynamic sitting balance during ADLs with: Contact Guard/Touching assist Goal: LTG Patient will maintain dynamic standing with ADLs (OT) Description: LTG:  Patient will maintain dynamic standing balance with assist during activities of daily living (OT)  Flowsheets (Taken 09/25/2019 1701) LTG: Pt will maintain dynamic standing balance during ADLs with: Contact Guard/Touching assist   Problem: RH Grooming Goal: LTG Patient will perform grooming w/assist,cues/equip (OT) Description: LTG: Patient will perform grooming with assist, with/without cues using equipment (OT) Flowsheets (Taken 09/25/2019 1701) LTG: Pt will perform grooming with assistance level of: Supervision/Verbal cueing   Problem: RH Bathing Goal: LTG Patient will bathe all body parts with assist levels (OT) Description: LTG: Patient will bathe all body parts with assist levels (OT) Flowsheets (Taken 09/25/2019 1701) LTG: Pt will perform bathing with assistance level/cueing: Minimal Assistance - Patient > 75%   Problem: RH Dressing Goal: LTG Patient will perform upper body dressing (OT) Description: LTG Patient will perform upper body dressing with assist, with/without cues (OT). Flowsheets (Taken 09/25/2019 1701) LTG: Pt will perform upper body dressing with assistance level of: Supervision/Verbal cueing Goal: LTG Patient will perform lower body dressing w/assist (OT) Description: LTG: Patient will perform lower body dressing with assist, with/without cues in positioning using equipment (OT) Flowsheets (Taken 09/25/2019 1701) LTG: Pt will perform lower body dressing with assistance level of: Minimal Assistance - Patient > 75%   Problem: RH Toileting Goal: LTG Patient will  perform toileting task (3/3 steps) with assistance level (OT) Description: LTG: Patient will perform toileting task (3/3 steps) with assistance level (OT)  Flowsheets (Taken 09/25/2019 1701) LTG: Pt will perform toileting task (3/3 steps) with assistance level: Minimal Assistance - Patient > 75%   Problem: RH Toilet Transfers Goal: LTG Patient will perform toilet transfers w/assist (OT) Description: LTG: Patient will perform toilet transfers with assist, with/without cues using equipment (OT) Flowsheets (Taken 09/25/2019 1701) LTG: Pt will perform toilet transfers with assistance level of: Supervision/Verbal cueing   Problem: RH Tub/Shower Transfers Goal: LTG Patient will perform tub/shower transfers w/assist (OT) Description: LTG: Patient will perform tub/shower transfers with assist, with/without cues using equipment (OT) Flowsheets (Taken 09/25/2019 1701) LTG: Pt will perform tub/shower stall transfers with assistance level of: Contact Guard/Touching assist

## 2019-09-25 NOTE — Plan of Care (Signed)
  Problem: Consults Goal: RH GENERAL PATIENT EDUCATION Description: See Patient Education module for education specifics. Outcome: Progressing Goal: Skin Care Protocol Initiated - if Braden Score 18 or less Description: If consults are not indicated, leave blank or document N/A Outcome: Progressing Goal: Nutrition Consult-if indicated Outcome: Progressing Goal: Diabetes Guidelines if Diabetic/Glucose > 140 Description: If diabetic or lab glucose is > 140 mg/dl - Initiate Diabetes/Hyperglycemia Guidelines & Document Interventions  Outcome: Progressing   Problem: RH BOWEL ELIMINATION Goal: RH STG MANAGE BOWEL WITH ASSISTANCE Description: STG Manage Bowel with Assistance. Outcome: Progressing Goal: RH STG MANAGE BOWEL W/MEDICATION W/ASSISTANCE Description: STG Manage Bowel with Medication with Assistance. Outcome: Progressing   Problem: RH BLADDER ELIMINATION Goal: RH STG MANAGE BLADDER WITH ASSISTANCE Description: STG Manage Bladder With Assistance Outcome: Progressing Goal: RH STG MANAGE BLADDER WITH MEDICATION WITH ASSISTANCE Description: STG Manage Bladder With Medication With Assistance. Outcome: Progressing Goal: RH STG MANAGE BLADDER WITH EQUIPMENT WITH ASSISTANCE Description: STG Manage Bladder With Equipment With Assistance Outcome: Progressing   Problem: RH SKIN INTEGRITY Goal: RH STG SKIN FREE OF INFECTION/BREAKDOWN Outcome: Progressing Goal: RH STG MAINTAIN SKIN INTEGRITY WITH ASSISTANCE Description: STG Maintain Skin Integrity With Assistance. Outcome: Progressing Goal: RH STG ABLE TO PERFORM INCISION/WOUND CARE W/ASSISTANCE Description: STG Able To Perform Incision/Wound Care With Assistance. Outcome: Progressing   Problem: RH SAFETY Goal: RH STG ADHERE TO SAFETY PRECAUTIONS W/ASSISTANCE/DEVICE Description: STG Adhere to Safety Precautions With Assistance/Device. Outcome: Progressing Goal: RH STG DECREASED RISK OF FALL WITH ASSISTANCE Description: STG  Decreased Risk of Fall With Assistance. Outcome: Progressing   Problem: RH PAIN MANAGEMENT Goal: RH STG PAIN MANAGED AT OR BELOW PT'S PAIN GOAL Outcome: Progressing   Problem: RH KNOWLEDGE DEFICIT GENERAL Goal: RH STG INCREASE KNOWLEDGE OF SELF CARE AFTER HOSPITALIZATION Outcome: Progressing   Problem: RH Vision Goal: RH LTG Vision (Specify) Outcome: Progressing   Problem: RH Pre-functional/Other (Specify) Goal: RH LTG Pre-functional (Specify) Outcome: Progressing Goal: RH LTG Interdisciplinary (Specify) 1 Description: RH LTG Interdisciplinary (Specify)1 Outcome: Progressing Goal: RH LTG Interdisciplinary (Specify) 2 Description: RH LTG Interdisciplinary (Specify) 2  Outcome: Progressing   

## 2019-09-25 NOTE — Progress Notes (Signed)
[place] PHYSICAL MEDICINE & REHABILITATION PROGRESS NOTE   Subjective/Complaints: Didn't sleep last night b/c of headache  ROS: Patient denies fever, rash, sore throat, blurred vision, nausea, vomiting, diarrhea, cough, shortness of breath or chest pain, joint or back pain, or mood change.    Objective:   No results found. Recent Labs    09/24/19 0300  WBC 9.8  HGB 13.0  HCT 39.6  PLT 349   Recent Labs    09/23/19 0641 09/24/19 0300  NA 144 141  K  --  3.5  CL  --  106  CO2  --  25  GLUCOSE  --  116*  BUN  --  27*  CREATININE  --  0.98  CALCIUM  --  9.7    Intake/Output Summary (Last 24 hours) at 09/25/2019 1153 Last data filed at 09/25/2019 0900 Gross per 24 hour  Intake 180 ml  Output 1000 ml  Net -820 ml     Physical Exam: Vital Signs Blood pressure (!) 150/83, pulse 62, temperature 98.1 F (36.7 C), temperature source Oral, resp. rate 17, height 6' (1.829 m), weight 72.7 kg, SpO2 97 %. Constitutional: No distress . Vital signs reviewed.  HEENT: EOMI, oral membranes moist Neck: supple Cardiovascular: RRR without murmur. No JVD    Respiratory/Chest: CTA Bilaterally without wheezes or rales. Normal effort    GI/Abdomen: BS +, non-tender, non-distended Ext: no clubbing, cyanosis, or edema.  Musculoskeletal:  General: No edema.Normal range of motion.  Cervical back: Normal range of motion.  Neurological: He isalertand oriented to person, place, and time. Left central 7, left HH and neglect. Dysarthric. Fair insight and awareness. Language appears intact. LUE 0/5 LLE 0/5--no initiation. Senses pain on left. Moves RUE and RLE spontaneously.  Skin: Skin iswarmand dry.No rashnoted. He is not diaphoretic. Noerythema.  Psychiatric: flat    Assessment/Plan: 1. Functional deficits secondary to right thalamic hemorrhage which require 3+ hours per day of interdisciplinary therapy in a comprehensive inpatient rehab setting.  Physiatrist is  providing close team supervision and 24 hour management of active medical problems listed below.  Physiatrist and rehab team continue to assess barriers to discharge/monitor patient progress toward functional and medical goals  Care Tool:  Bathing    Body parts bathed by patient: Chest, Abdomen, Front perineal area, Buttocks, Right upper leg, Left upper leg, Face   Body parts bathed by helper: Right arm, Left arm, Right lower leg, Left lower leg     Bathing assist Assist Level: Maximal Assistance - Patient 24 - 49%     Upper Body Dressing/Undressing Upper body dressing Upper body dressing/undressing activity did not occur (including orthotics): N/A What is the patient wearing?: Pull over shirt    Upper body assist Assist Level: Maximal Assistance - Patient 25 - 49%    Lower Body Dressing/Undressing Lower body dressing      What is the patient wearing?: Underwear/pull up, Pants     Lower body assist Assist for lower body dressing: Maximal Assistance - Patient 25 - 49%     Toileting Toileting Toileting Activity did not occur (Clothing management and hygiene only): N/A (no void or bm)  Toileting assist       Transfers Chair/bed transfer  Transfers assist           Locomotion Ambulation   Ambulation assist              Walk 10 feet activity   Assist  Walk 50 feet activity   Assist           Walk 150 feet activity   Assist           Walk 10 feet on uneven surface  activity   Assist           Wheelchair     Assist               Wheelchair 50 feet with 2 turns activity    Assist            Wheelchair 150 feet activity     Assist          Blood pressure (!) 150/83, pulse 62, temperature 98.1 F (36.7 C), temperature source Oral, resp. rate 17, height 6' (1.829 m), weight 72.7 kg, SpO2 97 %.  Medical Problem List and Plan: 1.Left hemiparesis and visual-spatial deficitssecondary to  right thalamic hemorrhage with intraventricular extension -patient may shower -ELOS/Goals: 20-24 days, min assist with mobility and self-care and supervision with cognition and communication -beginning therapies today -WHO,PRAFO for LUE and LLE 2. Antithrombotics: -DVT/anticoagulation:Mechanical:Sequential compression devices, below kneeBilateral lower extremities--dopplers ordered -antiplatelet therapy: N/a 3.Headaches/Pain Management:Fioricet or ultram [place]'t helped a great deal.  2/28 -increase topamax to 25mg  bid 4. Mood:LCSW to follow for evaluation and support. -antipsychotic agents: N/A 5. Neuropsych: This patientiscapable of making decisions on hisown behalf. 6. Skin/Wound Care:Routine pressure relief measures. 7. Fluids/Electrolytes/Nutrition:Monitor I/O. Offer nectar liquids between meals to maintain adequate hydration. 8. HTN; Monitor BP tid-continue Hydralazine, Cozaar, Coreg, Catapres and Norvasc. TItrateas indicated--SBP goal<140.  2/28 follow for consistent pattern 9. Prediabetes: Hgb A1C- 6.2--was 5.7 eight months ago. Will have RD educate on CM diet--- monitor BS ac/hs with SSI for now.   2/28 fair control at present 10. Tobacco/Cannabis use: Reports that he plans on quitting.  11. Dyslipidemia: Statin on hold due to ICH--resume at discharge.    LOS: 1 days A FACE TO FACE EVALUATION WAS PERFORMED  Meredith Staggers 09/25/2019, 11:53 AM

## 2019-09-25 NOTE — Progress Notes (Signed)
VASCULAR LAB PRELIMINARY  PRELIMINARY  PRELIMINARY  PRELIMINARY  Bilateral lower extremity venous duplex completed.    Preliminary report:  See CV proc for preliminary results.   Messaged Dr. Naaman Plummer with results.   Cintya Daughety, RVT 09/25/2019, 4:16 PM

## 2019-09-26 ENCOUNTER — Inpatient Hospital Stay (HOSPITAL_COMMUNITY): Payer: Self-pay | Admitting: Speech Pathology

## 2019-09-26 ENCOUNTER — Inpatient Hospital Stay (HOSPITAL_COMMUNITY): Payer: Self-pay | Admitting: Occupational Therapy

## 2019-09-26 ENCOUNTER — Inpatient Hospital Stay (HOSPITAL_COMMUNITY): Payer: Self-pay | Admitting: Physical Therapy

## 2019-09-26 LAB — GLUCOSE, CAPILLARY
Glucose-Capillary: 104 mg/dL — ABNORMAL HIGH (ref 70–99)
Glucose-Capillary: 99 mg/dL (ref 70–99)
Glucose-Capillary: 110 mg/dL — ABNORMAL HIGH (ref 70–99)
Glucose-Capillary: 106 mg/dL — ABNORMAL HIGH (ref 70–99)

## 2019-09-26 LAB — CBC WITH DIFFERENTIAL/PLATELET
Abs Immature Granulocytes: 0.01 10*3/uL (ref 0.00–0.07)
Basophils Absolute: 0.1 10*3/uL (ref 0.0–0.1)
Basophils Relative: 1 %
Eosinophils Absolute: 0.4 10*3/uL (ref 0.0–0.5)
Eosinophils Relative: 5 %
HCT: 40.6 % (ref 39.0–52.0)
Hemoglobin: 13.4 g/dL (ref 13.0–17.0)
Immature Granulocytes: 0 %
Lymphocytes Relative: 23 %
Lymphs Abs: 1.7 10*3/uL (ref 0.7–4.0)
MCH: 30.7 pg (ref 26.0–34.0)
MCHC: 33 g/dL (ref 30.0–36.0)
MCV: 93.1 fL (ref 80.0–100.0)
Monocytes Absolute: 0.8 10*3/uL (ref 0.1–1.0)
Monocytes Relative: 11 %
Neutro Abs: 4.4 10*3/uL (ref 1.7–7.7)
Neutrophils Relative %: 60 %
Platelets: 386 10*3/uL (ref 150–400)
RBC: 4.36 MIL/uL (ref 4.22–5.81)
RDW: 13.6 % (ref 11.5–15.5)
WBC: 7.4 10*3/uL (ref 4.0–10.5)
nRBC: 0 % (ref 0.0–0.2)

## 2019-09-26 LAB — COMPREHENSIVE METABOLIC PANEL
ALT: 52 U/L — ABNORMAL HIGH (ref 0–44)
AST: 27 U/L (ref 15–41)
Albumin: 3.5 g/dL (ref 3.5–5.0)
Alkaline Phosphatase: 52 U/L (ref 38–126)
Anion gap: 11 (ref 5–15)
BUN: 25 mg/dL — ABNORMAL HIGH (ref 6–20)
CO2: 25 mmol/L (ref 22–32)
Calcium: 9.7 mg/dL (ref 8.9–10.3)
Chloride: 103 mmol/L (ref 98–111)
Creatinine, Ser: 1.08 mg/dL (ref 0.61–1.24)
GFR calc Af Amer: >60 mL/min (ref >60)
GFR calc non Af Amer: >60 mL/min (ref >60)
Glucose, Bld: 119 mg/dL — ABNORMAL HIGH (ref 70–99)
Potassium: 3.9 mmol/L (ref 3.5–5.1)
Sodium: 139 mmol/L (ref 135–145)
Total Bilirubin: 0.3 mg/dL (ref 0.3–1.2)
Total Protein: 7.1 g/dL (ref 6.5–8.1)

## 2019-09-26 MED ORDER — TOPIRAMATE 25 MG PO TABS
50.0000 mg | ORAL_TABLET | Freq: Two times a day (BID) | ORAL | Status: DC
  Administered 2019-09-26 – 2019-10-04 (×16): 50 mg via ORAL
  Filled 2019-09-26 (×16): qty 2

## 2019-09-26 NOTE — Progress Notes (Signed)
Speech Language Pathology Daily Session Note  Patient Details  Name: Paul [name] MRN: [id number] Date of Birth: [birth year]  Today's Date: 09/26/2019 SLP Individual Time: 0730-0830 SLP Individual Time Calculation (min): 60 min  Short Term Goals: Week 1: SLP Short Term Goal 1 (Week 1): Pt will consume current diet with minmal overt s/sx aspiration and demonstrate efficient mastication and oral clearance with Supervision A verbal cues for use of swallow strategies. SLP Short Term Goal 2 (Week 1): Pt will consume ice/thin H2O trials X3 with minimal overt s/sx aspiration prior to repeat MBSS. SLP Short Term Goal 3 (Week 1): Pt will demonstrate ability to problem solve during mildly complex functional tasks with Min A verbal/visual cues. SLP Short Term Goal 4 (Week 1): Pt will demonstrate executive functioning skills (organization, planning, reasoning) during functional tasks with Min A verbal/visual cues. SLP Short Term Goal 5 (Week 1): Pt will use speech intelligibility strategies to increase intelligibility to 90% at the conversation level with Min A cues.  Skilled Therapeutic Interventions:Skilled treatment session focused on dysphagia and speech goals. SLP facilitated session by providing skilled observation with breakfast meal of Dys. 2 textures with nectar-thick liquids. Patient required mod verbal cues to self-monitor and correct left anterior spillage as well as utilizing increased lingual movement for bolus manipulation and to clear pocketing.  Patient with one overt cough with nectar-thick liquids, suspect due to bolus size. Recommend patient continue current diet with full supervision to maximize safety. SLP also facilitated session by providing Mod A verbal cues for patient to utilize an increased vocal intensity at the phrase level to achieve ~75% intelligibility. Patient appeared confused towards end of session and reported difficulty recalling who visited him this weekend and who is  "designated" visitor was. Patient attempted to call his girlfriend but was unable to recall her number. Patient handed off to NT. Continue with current plan of care.      Pain 6/10 headache RN aware and administered medications   Therapy/Group: Individual Therapy  Keidrick Murty 09/26/2019, 2:33 PM

## 2019-09-26 NOTE — Progress Notes (Signed)
[place] PHYSICAL MEDICINE & REHABILITATION PROGRESS NOTE   Subjective/Complaints: Trying to call mom this morning but having difficulty. Says he is confused.  Has headache now and slept poorly last night because of headache.  Says he is not having regular BM.   ROS: Patient denies fever, rash, sore throat, blurred vision, nausea, vomiting, diarrhea, cough, shortness of breath or chest pain, joint or back pain, or mood change.    Objective:   VAS [place] LOWER EXTREMITY VENOUS (DVT)  Result Date: 09/25/2019  Lower Venous DVTStudy Indications: Thalamic hemorrhage, immobility.  Comparison Study: Prior study from 01/20/19 is available for comparison. Performing Technologist: Sharion Dove RVS  Examination Guidelines: A complete evaluation includes B-mode imaging, spectral Doppler, color Doppler, and power Doppler as needed of all accessible portions of each vessel. Bilateral testing is considered an integral part of a complete examination. Limited examinations for reoccurring indications may be performed as noted. The reflux portion of the exam is performed with the patient in reverse Trendelenburg.  +---------+---------------+---------+-----------+----------+--------------+ RIGHT    CompressibilityPhasicitySpontaneityPropertiesThrombus Aging +---------+---------------+---------+-----------+----------+--------------+ CFV      Full           Yes      Yes                                 +---------+---------------+---------+-----------+----------+--------------+ SFJ      Full                                                        +---------+---------------+---------+-----------+----------+--------------+ FV Prox  Full                                                        +---------+---------------+---------+-----------+----------+--------------+ FV Mid   Full                                                         +---------+---------------+---------+-----------+----------+--------------+ FV DistalFull                                                        +---------+---------------+---------+-----------+----------+--------------+ PFV      Full                                                        +---------+---------------+---------+-----------+----------+--------------+ POP      Full           Yes      Yes                                 +---------+---------------+---------+-----------+----------+--------------+  PTV      Full                                                        +---------+---------------+---------+-----------+----------+--------------+ PERO     Full                                                        +---------+---------------+---------+-----------+----------+--------------+   +---------+---------------+---------+-----------+----------+--------------+ LEFT     CompressibilityPhasicitySpontaneityPropertiesThrombus Aging +---------+---------------+---------+-----------+----------+--------------+ CFV      Full           Yes      Yes                                 +---------+---------------+---------+-----------+----------+--------------+ SFJ      Full                                                        +---------+---------------+---------+-----------+----------+--------------+ FV Prox  Full                                                        +---------+---------------+---------+-----------+----------+--------------+ FV Mid   Full                                                        +---------+---------------+---------+-----------+----------+--------------+ FV DistalFull                                                        +---------+---------------+---------+-----------+----------+--------------+ PFV      Full                                                         +---------+---------------+---------+-----------+----------+--------------+ POP      Full           No       No                                  +---------+---------------+---------+-----------+----------+--------------+ PTV      Full                                                        +---------+---------------+---------+-----------+----------+--------------+  PERO     Full                                                        +---------+---------------+---------+-----------+----------+--------------+     Summary: BILATERAL: - No evidence of deep vein thrombosis seen in the lower extremities, bilaterally.  RIGHT: Sluggish flow noted  LEFT: Significant sluggish flow noted, especially in the popliteal vein.  *See table(s) above for measurements and observations.    Preliminary    Recent Labs    09/24/19 0300 09/26/19 0605  WBC 9.8 7.4  HGB 13.0 13.4  HCT 39.6 40.6  PLT 349 386   Recent Labs    09/24/19 0300 09/26/19 0605  NA 141 139  K 3.5 3.9  CL 106 103  CO2 25 25  GLUCOSE 116* 119*  BUN 27* 25*  CREATININE 0.98 1.08  CALCIUM 9.7 9.7    Intake/Output Summary (Last 24 hours) at 09/26/2019 1047 Last data filed at 09/26/2019 0700 Gross per 24 hour  Intake 450 ml  Output 1050 ml  Net -600 ml     Physical Exam: Vital Signs Blood pressure 122/64, pulse (!) 51, temperature (!) 97.4 F (36.3 C), temperature source Oral, resp. rate 15, height 6' (1.829 m), weight 72.7 kg, SpO2 97 %. Constitutional: No distress . Vital signs reviewed. Sitting up in bed trying to call mom with phone.  HEENT: EOMI, oral membranes moist Neck: supple Cardiovascular: RRR without murmur. No JVD    Respiratory/Chest: CTA Bilaterally without wheezes or rales. Normal effort    GI/Abdomen: BS +, non-tender, non-distended Ext: no clubbing, cyanosis, or edema.  Musculoskeletal:  General: No edema.Normal range of motion.  Cervical back: Normal range of motion.  Neurological: He  isalertand oriented to person, place, and time. Left central 7, left HH and neglect. Dysarthric. Fair insight and awareness. Language appears intact. LUE 0/5 LLE 0/5--no initiation. Senses pain on left. Moves RUE and RLE spontaneously. Skin: Skin iswarmand dry.No rashnoted. He is not diaphoretic. Noerythema.  Psychiatric: pleasant, confused, makes good eye contact and shakes hand.   Assessment/Plan: 1. Functional deficits secondary to right thalamic hemorrhage which require 3+ hours per day of interdisciplinary therapy in a comprehensive inpatient rehab setting.  Physiatrist is providing close team supervision and 24 hour management of active medical problems listed below.  Physiatrist and rehab team continue to assess barriers to discharge/monitor patient progress toward functional and medical goals  Care Tool:  Bathing    Body parts bathed by patient: Chest, Abdomen, Front perineal area, Buttocks, Right upper leg, Left upper leg, Face   Body parts bathed by helper: Right arm, Left arm, Right lower leg, Left lower leg     Bathing assist Assist Level: Maximal Assistance - Patient 24 - 49%     Upper Body Dressing/Undressing Upper body dressing Upper body dressing/undressing activity did not occur (including orthotics): N/A What is the patient wearing?: Pull over shirt    Upper body assist Assist Level: Maximal Assistance - Patient 25 - 49%    Lower Body Dressing/Undressing Lower body dressing      What is the patient wearing?: Underwear/pull up, Pants     Lower body assist Assist for lower body dressing: Maximal Assistance - Patient 25 - 49%     Toileting Toileting Toileting Activity did not occur Landscape architect and  hygiene only): N/A (no void or bm)  Toileting assist       Transfers Chair/bed transfer  Transfers assist     Chair/bed transfer assist level: Moderate Assistance - Patient 50 - 74%     Locomotion Ambulation   Ambulation assist    Ambulation activity did not occur: Safety/medical concerns(Increased R hip pain in standing, pt unable to tolerance standing)          Walk 10 feet activity   Assist  Walk 10 feet activity did not occur: Safety/medical concerns(Increased R hip pain in standing, pt unable to tolerance standing)        Walk 50 feet activity   Assist Walk 50 feet with 2 turns activity did not occur: Safety/medical concerns(Increased R hip pain in standing, pt unable to tolerance standing)         Walk 150 feet activity   Assist Walk 150 feet activity did not occur: Safety/medical concerns(Increased R hip pain in standing, pt unable to tolerance standing)         Walk 10 feet on uneven surface  activity   Assist Walk 10 feet on uneven surfaces activity did not occur: Safety/medical concerns(Increased R hip pain in standing, pt unable to tolerance standing)         Wheelchair     Assist   Type of Wheelchair: Manual    Wheelchair assist level: Moderate Assistance - Patient 50 - 74% Max wheelchair distance: 5'(Unable to steer any further without skilled intervention)    Wheelchair 50 feet with 2 turns activity    Assist    Wheelchair 50 feet with 2 turns activity did not occur: Safety/medical concerns(unable without skilled intervention)       Wheelchair 150 feet activity     Assist  Wheelchair 150 feet activity did not occur: Safety/medical concerns(unable without skilled intervention)       Blood pressure 122/64, pulse (!) 51, temperature (!) 97.4 F (36.3 C), temperature source Oral, resp. rate 15, height 6' (1.829 m), weight 72.7 kg, SpO2 97 %.  Medical Problem List and Plan: 1.Left hemiparesis and visual-spatial deficitssecondary to right thalamic hemorrhage with intraventricular extension -patient may shower -ELOS/Goals: 20-24 days, min assist with mobility and self-care and supervision with cognition and  communication -Continue PT, OT, SLP -[facility] for LUE and LLE 2. Antithrombotics: -DVT/anticoagulation:Mechanical:Sequential compression devices, below kneeBilateral lower extremities--dopplers ordered -antiplatelet therapy: N/a 3.Headaches/Pain Management:Fioricet or ultram pr[place]aven't helped a great deal.  2/28 -increase topamax to 25mg  bid  3/1: will increase Topamax to 50 BID. First 50mg  dose will be tonight.  4. Mood:LCSW to follow for evaluation and support. -antipsychotic agents: N/A 5. Neuropsych: This patientiscapable of making decisions on hisown behalf. 6. Skin/Wound Care:Routine pressure relief measures. 7. Fluids/Electrolytes/Nutrition:Monitor I/O. Offer nectar liquids between meals to maintain adequate hydration. 8. HTN; Monitor BP tid-continue Hydralazine, Cozaar, Coreg, Catapres and Norvasc. TItrateas indicated--SBP goal<140.  2/28 follow for consistent pattern  3/1: better controlled.  9. Prediabetes: Hgb A1C- 6.2--was 5.7 eight months ago. Will have RD educate on CM diet--- monitor BS ac/hs with SSI for now.   2/28 fair control at present  3/1: well controlled.  10. Tobacco/Cannabis use: Reports that he plans on quitting.  11. Dyslipidemia: Statin on hold due to ICH--resume at discharge.    LOS: 2 days A FACE TO FACE EVALUATION WAS PERFORMED  Clide Deutscher Mani Celestin 09/26/2019, 10:47 AM

## 2019-09-26 NOTE — Care Management (Signed)
Inpatient [facility] Individual Statement of Services  Patient Name:  Paul [name]  Date:  09/26/2019  Welcome to the [facility].  Our goal is to provide you with an individualized program based on your diagnosis and situation, designed to meet your specific needs.  With this comprehensive rehabilitation program, you will be expected to participate in at least 3 hours of rehabilitation therapies Monday-Friday, with modified therapy programming on the weekends.  Your rehabilitation program will include the following services:  Physical Therapy (PT), Occupational Therapy (OT), Speech Therapy (ST), 24 hour per day rehabilitation nursing, Therapeutic Recreaction (TR), Psychology, Neuropsychology, Case Management (Social Worker), Rehabilitation Medicine, Nutrition Services, Pharmacy Services and Other  Weekly team conferences will be held on Tuesday to discuss your progress.  Your Social Worker will talk with you frequently to get your input and to update you on team discussions.  Team conferences with you and your family in attendance may also be held.  Expected length of stay: 3-4 weeks  Overall anticipated outcome: Minimal Assistance  Depending on your progress and recovery, your program may change. Your Social Worker will coordinate services and will keep you informed of any changes. Your Social Worker's name and contact numbers are listed  below.  The following services may also be recommended but are not provided by the [facility] will be made to provide these services after discharge if needed.  Arrangements include referral to agencies that provide these services.  Your insurance has been verified to be:  Uninsured  Your primary doctor is:  No PCP  Pertinent information will be shared with your  doctor and your insurance company.  Social Worker: Loralee Pacas, LCSWA  Information discussed with and copy given to patient by: Rana Snare, 09/26/2019, 4:14 PM

## 2019-09-26 NOTE — Progress Notes (Signed)
Initial Nutrition Assessment  **RD working remotely**  DOCUMENTATION CODES:   Not applicable  INTERVENTION:   Magic cup TID with meals, each supplement provides 290 kcal and 9 grams of protein  H[employer] shake TID, each supplement provides 520 kcal and 22 grams of protein  Educated patient on a diabetic diet    NUTRITION DIAGNOSIS:   Inadequate oral intake related to dysphagia as evidenced by meal completion < 50%.   GOAL:   Patient will meet greater than or equal to 90% of their needs    MONITOR:   PO intake, Supplement acceptance, Diet advancement  REASON FOR ASSESSMENT:   Consult Diet education  ASSESSMENT:   Pt with a PMH of HTN, HLD, embolic stroke, tobacco and cannabis use who was admitted on 09/17/19 with right thalamic hemorrhage with IVH and he was transferred to [facility] for management. Pt now with improving mentation and diet advanced to dysphagia 2, nectar liquids. Pt with limitations due to left hemiplegia with left inattention, difficulty with multi-step commands but showing improvement in attention. Admitted to CIR on 2/27.  Pt providing one-word responses to RD questions. Pt reports appetite is "fine" and is agreeable to supplements.  Pt is noted for a 5% wt loss x1 week, which is significant for time frame.   PO intake: 20-25% x 3 recorded meals  Medications reviewed and include: SSI, MVI, Senokot-S  Labs reviewed. CBGs 104-125  UOP: 109ml x24 hours I/O: -1,956ml since admit  NUTRITION - FOCUSED PHYSICAL EXAM:  RD unable to perform at this time.    Diet Order:   Diet Order            DIET DYS 2 Room service appropriate? Yes; Fluid consistency: Nectar Thick  Diet effective now              EDUCATION NEEDS:   Education needs have been addressed  Skin:  Skin Assessment: Reviewed RN Assessment  Last BM:  2/27  Height:   Ht Readings from Last 1 Encounters:  09/25/19 6' (1.829 m)    Weight:   Wt Readings from Last 1  Encounters:  09/25/19 72.7 kg    BMI:  Body mass index is 21.74 kg/m.  Estimated Nutritional Needs:   Kcal:  2100-2300  Protein:  95-110 grams  Fluid:  2L/d   Larkin Ina, MS, RD, LDN RD pager number and weekend/on-call pager number located in [place].

## 2019-09-26 NOTE — Progress Notes (Signed)
Inpatient Rehabilitation  Patient information reviewed and entered into eRehab system by Brenisha Tsui M. Assia Meanor, M.A., CCC/SLP, PPS Coordinator.  Information including medical coding, functional ability and quality indicators will be reviewed and updated through discharge.    

## 2019-09-26 NOTE — Progress Notes (Signed)
Physical Therapy Session Note  Patient Details  Name: Paul [name] MRN: [id number] Date of Birth: [birth year]  Today's Date: 09/26/2019 PT Individual Time: 1404-1501 PT Individual Time Calculation (min): 57 min   Short Term Goals: Week 1:  PT Short Term Goal 1 (Week 1): Patient will perform basic transfers with min A consistently using LRAD. PT Short Term Goal 2 (Week 1): Patient will initiate gait training. PT Short Term Goal 3 (Week 1): Patient will tolerate standing activities >2 min.  Skilled Therapeutic Interventions/Progress Updates:  Pt received in bed with brother Leroy Sea) present with mask below chin. Therapist asked him to don it properly & educated him on policy to wear mask properly at all times; [name] adjusted mask when asked.   Pt agreeable to tx, transferring supine>sit with hospital bed features & mod assist to attend to & move LLE as well as upright trunk. Pt attempts stand pivot to w/c on L x 2 trials with pt ultimately requiring max/total assist to safely sit in w/c seat 2/2 impaired ability to pivot, attend to w/c on L, and LLE buckling. Transported pt to gym and pt completes squat pivot w/c<>mat table with pt always moving to R with mod assist. Pt requires min<>mod assist for unsupported sitting balance while reaching to obtain cards placed on L then match them on velcro boards with task focusing on trunk control, midline orientation, L attention & scanning, and endurance. Pt reports fatigue after completing matches on 2 boards so pt returned to w/c to rest. Pt reports 5/10 HA & nurse made aware & administered tylenol. BP = 134/76 mmHg (LUE), HR = 68 bpm. W/c mobility with min assist increasing to mod/max assist with therapist providing cuing for R hemi technique and cuing to attend to L side. Pt requests water & therapist prepares thickened water, provides supervision (coughing episode x 1 time) with cuing for small sips & to wipe mouth after. Pt asking if he could call his brother to  take him home with therapist educating him on situation. Pt left in w/c with chair alarm donned, call bell & phone in reach. Reviewed used of call bell with pt.    Therapy Documentation Precautions:  Precautions Precautions: Fall Precaution Comments: left sided weakness and left lateral lean, SBP <160 Restrictions Weight Bearing Restrictions: No Other Position/Activity Restrictions: L PRAFO and WHO donned at rest  Pain: 5/10 HA - nurse administered tylenol   Therapy/Group: Individual Therapy  [name] 09/26/2019, 3:42 PM

## 2019-09-26 NOTE — Plan of Care (Signed)
  Problem: Consults Goal: RH GENERAL PATIENT EDUCATION Description: See Patient Education module for education specifics. Outcome: Progressing Goal: Skin Care Protocol Initiated - if Braden Score 18 or less Description: If consults are not indicated, leave blank or document N/A Outcome: Progressing Goal: Nutrition Consult-if indicated Outcome: Progressing Goal: Diabetes Guidelines if Diabetic/Glucose > 140 Description: If diabetic or lab glucose is > 140 mg/dl - Initiate Diabetes/Hyperglycemia Guidelines & Document Interventions  Outcome: Progressing   Problem: RH BOWEL ELIMINATION Goal: RH STG MANAGE BOWEL WITH ASSISTANCE Description: STG Manage Bowel with Assistance. Outcome: Progressing Goal: RH STG MANAGE BOWEL W/MEDICATION W/ASSISTANCE Description: STG Manage Bowel with Medication with Assistance. Outcome: Progressing   Problem: RH BLADDER ELIMINATION Goal: RH STG MANAGE BLADDER WITH ASSISTANCE Description: STG Manage Bladder With Assistance Outcome: Progressing Goal: RH STG MANAGE BLADDER WITH MEDICATION WITH ASSISTANCE Description: STG Manage Bladder With Medication With Assistance. Outcome: Progressing Goal: RH STG MANAGE BLADDER WITH EQUIPMENT WITH ASSISTANCE Description: STG Manage Bladder With Equipment With Assistance Outcome: Progressing   Problem: RH SKIN INTEGRITY Goal: RH STG SKIN FREE OF INFECTION/BREAKDOWN Outcome: Progressing Goal: RH STG MAINTAIN SKIN INTEGRITY WITH ASSISTANCE Description: STG Maintain Skin Integrity With Assistance. Outcome: Progressing Goal: RH STG ABLE TO PERFORM INCISION/WOUND CARE W/ASSISTANCE Description: STG Able To Perform Incision/Wound Care With Assistance. Outcome: Progressing   Problem: RH SAFETY Goal: RH STG ADHERE TO SAFETY PRECAUTIONS W/ASSISTANCE/DEVICE Description: STG Adhere to Safety Precautions With Assistance/Device. Outcome: Progressing Goal: RH STG DECREASED RISK OF FALL WITH ASSISTANCE Description: STG  Decreased Risk of Fall With Assistance. Outcome: Progressing   Problem: RH KNOWLEDGE DEFICIT GENERAL Goal: RH STG INCREASE KNOWLEDGE OF SELF CARE AFTER HOSPITALIZATION Outcome: Progressing   Problem: RH Pre-functional/Other (Specify) Goal: RH LTG Pre-functional (Specify) Outcome: Progressing Goal: RH LTG Interdisciplinary (Specify) 1 Description: RH LTG Interdisciplinary (Specify)1 Outcome: Progressing Goal: RH LTG Interdisciplinary (Specify) 2 Description: RH LTG Interdisciplinary (Specify) 2  Outcome: Progressing

## 2019-09-26 NOTE — Progress Notes (Signed)
Occupational Therapy Session Note  Patient Details  Name: Paul [name]  MRN: [id number] Date of Birth: 29-Jan-1966  Today's Date: 09/26/2019 OT Individual Time: 1100-1200 OT Individual Time Calculation (min): 60 min   Short Term Goals: Week 1:  OT Short Term Goal 1 (Week 1): Pt will don overhead shirt with Mod A OT Short Term Goal 2 (Week 1): Pt will complete BSC or toilet transfer with 1 assist OT Short Term Goal 3 (Week 1): Pt will complete shower transfer with 1 assist  Skilled Therapeutic Interventions/Progress Updates:    Pt greeted sem-reclined with his brother Paul [name] present. [name] left to go to [employer]. [name] reported throbbing headache, but agreeable to BADL tasks. Pt completed LB bathing in bed using rolling to wash buttocks with mod A to roll to the R, then pt able to reach behind to wash buttocks. Max A to thread underwear, then worked on bridging to pull up pants. Pt needed OT assist for LLE positioning and support, then was able to bridge enough to get bottom off the bed. OT brought pt into sitting EOB with mod A. Pt with very poor sitting balance and some pushing to the L. Pt needed max A to maintain sitting balance at EOB while OT also worked on donning pants. Max A to thread pants and max A sit<>stand while OT pulled pants over hips. Max A to then pivot to wc. Worked on UB bathing.dressing a the sink. Pt needed verbal cues for initiation of task and L attention. Pt with some apraxia and difficulty learning hemi technique for dressing. Pt brushed teeth at the sink with verbal cues for sequencing, initiation, and termination. Pt then given water via tsp per water protocol. Pt with coughing after 2 or 3 sips and needed verbal cues to slow down in between sips. Pt left seated in wc with alarm belt on, call bell in reach, and brother present.   Therapy Documentation Precautions:  Precautions Precautions: Fall Precaution Comments: left sided weakness and left lateral lean, SBP  <160 Restrictions Weight Bearing Restrictions: No Other Position/Activity Restrictions: L PRAFO and WHO donned at rest Pain:  Pt reported 8/10 headache pain. Reposition and rest for comfort.   Therapy/Group: Individual Therapy  Valma Cava 09/26/2019, 12:38 PM

## 2019-09-27 ENCOUNTER — Inpatient Hospital Stay (HOSPITAL_COMMUNITY): Payer: Self-pay | Admitting: Speech Pathology

## 2019-09-27 ENCOUNTER — Inpatient Hospital Stay (HOSPITAL_COMMUNITY): Payer: Self-pay | Admitting: Occupational Therapy

## 2019-09-27 ENCOUNTER — Inpatient Hospital Stay (HOSPITAL_COMMUNITY): Payer: Self-pay | Admitting: Physical Therapy

## 2019-09-27 LAB — GLUCOSE, CAPILLARY
Glucose-Capillary: 110 mg/dL — ABNORMAL HIGH (ref 70–99)
Glucose-Capillary: 116 mg/dL — ABNORMAL HIGH (ref 70–99)
Glucose-Capillary: 121 mg/dL — ABNORMAL HIGH (ref 70–99)
Glucose-Capillary: 140 mg/dL — ABNORMAL HIGH (ref 70–99)

## 2019-09-27 NOTE — Progress Notes (Signed)
Physical Therapy Session Note  Patient Details  Name: Paul [name] MRN: [id number] Date of Birth: 1965-08-21  Today's Date: 09/27/2019 PT Individual Time: [id number] PT Individual Time Calculation (min): 71 min   Short Term Goals: Week 1:  PT Short Term Goal 1 (Week 1): Patient will perform basic transfers with min A consistently using LRAD. PT Short Term Goal 2 (Week 1): Patient will initiate gait training. PT Short Term Goal 3 (Week 1): Patient will tolerate standing activities >2 min.  Skilled Therapeutic Interventions/Progress Updates:  Pt received in w/c & agreeable to tx. Pt c/o being cold so provided max assist for donning sweatshirt with hemi technique & transported pt to gym via w/c dependent assist. Pt transfers w/c>mat table with mod assist & 2nd person present for safety with pt moving to R via squat pivot technique. While sitting on EOM pt requires min<>max assist for sitting balance. Pt engaged in reaching for then tossing horseshoes with task focusing on dynamic sitting balance. Progressed to pt standing in stedy, engaging in same balance activity with pt demonstrating decreased willingness to let go of stedy bar with RUE and pt with exaggerated R lateral lean requiring mirror & cuing for feedback for midline orientation. While standing in stedy therapist donned lite gait harness dependent assist. Attempted gait in lite gait with +2 assist x 7 ft with therapist providing MAX cuing for stepping pattern, total assist for advancing LLE and blocking L knee, and MAX cuing for pt to step & clear floor with RLE. Pt appears to be very internally distracted & have very poor sustained attention to all activities, with very poor safety awareness. Nurse administered pain meds & provided pt with thickened liquid. Pt returned to room via w/c dependent assist for time management & encouraged to drink more with cuing for small sips. Pt transfers w/c>bed with mod assist squat pivot with pt immediately lying  on side on bed with cuing to sit on bed after pivoting to it. Pt transfers to supine with min assist, using RLE to assist LLE to supine. Pt then performed bridging with MAX cuing for technique with activity focusing on hamstring/glute activation. Pt then reports need to void to provided total assist for urinal placement but pt unable to empty bladder. Pt left in bed with alarm set, call bell in reach.  Therapy Documentation Precautions:  Precautions Precautions: Fall Precaution Comments: left sided weakness and left lateral lean, SBP <160 Restrictions Weight Bearing Restrictions: No Other Position/Activity Restrictions: L PRAFO and WHO donned at rest   Pain: 6/10 HA - nurse made aware & administered meds  Therapy/Group: Individual Therapy  [name] 09/27/2019, 3:54 PM

## 2019-09-27 NOTE — Progress Notes (Signed)
[facility] PHYSICAL MEDICINE & REHABILITATION PROGRESS NOTE   Subjective/Complaints: Working on feeding with SLP when I saw him this morning. Still having headaches but didn't mention them initially to me until I cued. Anxious at times. Developed tingling in his right arm after I left room.  ROS: Patient denies fever, rash, sore throat, blurred vision, nausea, vomiting, diarrhea, cough, shortness of breath or chest pain,   or mood change.    Objective:   VAS [place] LOWER EXTREMITY VENOUS (DVT)  Result Date: 09/26/2019  Lower Venous DVTStudy Indications: Thalamic hemorrhage, immobility.  Comparison Study: Prior study from 01/20/19 is available for comparison. Performing Technologist: Sharion Dove RVS  Examination Guidelines: A complete evaluation includes B-mode imaging, spectral Doppler, color Doppler, and power Doppler as needed of all accessible portions of each vessel. Bilateral testing is considered an integral part of a complete examination. Limited examinations for reoccurring indications may be performed as noted. The reflux portion of the exam is performed with the patient in reverse Trendelenburg.  +---------+---------------+---------+-----------+----------+--------------+ RIGHT    CompressibilityPhasicitySpontaneityPropertiesThrombus Aging +---------+---------------+---------+-----------+----------+--------------+ CFV      Full           Yes      Yes                                 +---------+---------------+---------+-----------+----------+--------------+ SFJ      Full                                                        +---------+---------------+---------+-----------+----------+--------------+ FV Prox  Full                                                        +---------+---------------+---------+-----------+----------+--------------+ FV Mid   Full                                                         +---------+---------------+---------+-----------+----------+--------------+ FV DistalFull                                                        +---------+---------------+---------+-----------+----------+--------------+ PFV      Full                                                        +---------+---------------+---------+-----------+----------+--------------+ POP      Full           Yes      Yes                                 +---------+---------------+---------+-----------+----------+--------------+  PTV      Full                                                        +---------+---------------+---------+-----------+----------+--------------+ PERO     Full                                                        +---------+---------------+---------+-----------+----------+--------------+   +---------+---------------+---------+-----------+----------+--------------+ LEFT     CompressibilityPhasicitySpontaneityPropertiesThrombus Aging +---------+---------------+---------+-----------+----------+--------------+ CFV      Full           Yes      Yes                                 +---------+---------------+---------+-----------+----------+--------------+ SFJ      Full                                                        +---------+---------------+---------+-----------+----------+--------------+ FV Prox  Full                                                        +---------+---------------+---------+-----------+----------+--------------+ FV Mid   Full                                                        +---------+---------------+---------+-----------+----------+--------------+ FV DistalFull                                                        +---------+---------------+---------+-----------+----------+--------------+ PFV      Full                                                         +---------+---------------+---------+-----------+----------+--------------+ POP      Full           No       No                                  +---------+---------------+---------+-----------+----------+--------------+ PTV      Full                                                        +---------+---------------+---------+-----------+----------+--------------+  PERO     Full                                                        +---------+---------------+---------+-----------+----------+--------------+     Summary: BILATERAL: - No evidence of deep vein thrombosis seen in the lower extremities, bilaterally.  RIGHT: - Findings appear essentially unchanged compared to previous examination. Sluggish flow noted  LEFT: - Findings appear essentially unchanged compared to previous examination. Significant sluggish flow noted, especially in the popliteal vein.  *See table(s) above for measurements and observations. Electronically signed by Ruta Hinds MD on 09/26/2019 at 5:58:42 PM.    Final    Recent Labs    09/26/19 0605  WBC 7.4  HGB 13.4  HCT 40.6  PLT 386   Recent Labs    09/26/19 0605  NA 139  K 3.9  CL 103  CO2 25  GLUCOSE 119*  BUN 25*  CREATININE 1.08  CALCIUM 9.7    Intake/Output Summary (Last 24 hours) at 09/27/2019 1035 Last data filed at 09/27/2019 0900 Gross per 24 hour  Intake 120 ml  Output 475 ml  Net -355 ml     Physical Exam: Vital Signs Blood pressure (!) 108/59, pulse (!) 52, temperature 98.5 F (36.9 C), temperature source Oral, resp. rate 16, height 6' (1.829 m), weight 72.7 kg, SpO2 98 %. Constitutional: No distress . Vital signs reviewed. Sitting up in bed trying to call mom with phone.  HEENT: EOMI, oral membranes moist Neck: supple Cardiovascular: RRR without murmur. No JVD    Respiratory/Chest: CTA Bilaterally without wheezes or rales. Normal effort    GI/Abdomen: BS +, non-tender, non-distended Ext: no clubbing, cyanosis, or edema.   Musculoskeletal:  General: No edema.Normal range of motion.  Cervical back: Normal range of motion.  Neurological: He isalertand oriented to person, place, and time. Left central 7, left HH and neglect. Dysarthric. Fair insight and awareness. Language appears intact. LUE 0/5 LLE 0/5--no initiation. Senses pain on left. Moves RUE and RLE spontaneously. Skin: Skin iswarmand dry.No rashnoted. He is not diaphoretic. Noerythema.  Psychiatric: pleasant, confused, makes good eye contact and shakes hand.   Assessment/Plan: 1. Functional deficits secondary to right thalamic hemorrhage which require 3+ hours per day of interdisciplinary therapy in a comprehensive inpatient rehab setting.  Physiatrist is providing close team supervision and 24 hour management of active medical problems listed below.  Physiatrist and rehab team continue to assess barriers to discharge/monitor patient progress toward functional and medical goals  Care Tool:  Bathing    Body parts bathed by patient: Chest, Abdomen, Front perineal area, Buttocks, Right upper leg, Left upper leg, Face   Body parts bathed by helper: Right arm, Left arm, Right lower leg, Left lower leg     Bathing assist Assist Level: Maximal Assistance - Patient 24 - 49%     Upper Body Dressing/Undressing Upper body dressing   What is the patient wearing?: Pull over shirt    Upper body assist Assist Level: Maximal Assistance - Patient 25 - 49%    Lower Body Dressing/Undressing Lower body dressing      What is the patient wearing?: Underwear/pull up, Pants     Lower body assist Assist for lower body dressing: Total Assistance - Patient < 25%     Toileting Toileting Toileting Activity did not  occur Landscape architect and hygiene only): N/A (no void or bm)  Toileting assist Assist for toileting: Dependent - Patient 0%     Transfers Chair/bed transfer  Transfers assist     Chair/bed transfer assist level: 2  Helpers     Locomotion Ambulation   Ambulation assist   Ambulation activity did not occur: Safety/medical concerns(Increased R hip pain in standing, pt unable to tolerance standing)          Walk 10 feet activity   Assist  Walk 10 feet activity did not occur: Safety/medical concerns(Increased R hip pain in standing, pt unable to tolerance standing)        Walk 50 feet activity   Assist Walk 50 feet with 2 turns activity did not occur: Safety/medical concerns(Increased R hip pain in standing, pt unable to tolerance standing)         Walk 150 feet activity   Assist Walk 150 feet activity did not occur: Safety/medical concerns(Increased R hip pain in standing, pt unable to tolerance standing)         Walk 10 feet on uneven surface  activity   Assist Walk 10 feet on uneven surfaces activity did not occur: Safety/medical concerns(Increased R hip pain in standing, pt unable to tolerance standing)         Wheelchair     Assist   Type of Wheelchair: Manual    Wheelchair assist level: Moderate Assistance - Patient 50 - 74% Max wheelchair distance: 75 ft    Wheelchair 50 feet with 2 turns activity    Assist    Wheelchair 50 feet with 2 turns activity did not occur: Safety/medical concerns(unable without skilled intervention)   Assist Level: Moderate Assistance - Patient 50 - 74%   Wheelchair 150 feet activity     Assist  Wheelchair 150 feet activity did not occur: Safety/medical concerns(unable without skilled intervention)       Blood pressure (!) 108/59, pulse (!) 52, temperature 98.5 F (36.9 C), temperature source Oral, resp. rate 16, height 6' (1.829 m), weight 72.7 kg, SpO2 98 %.  Medical Problem List and Plan: 1.Left hemiparesis and visual-spatial deficitssecondary to right thalamic hemorrhage with intraventricular extension -patient may shower  -team conference -ELOS: 3/26 -Continue PT,  OT, SLP -[facility] for LUE and LLE  -suspect tingling in right arm was somewhat anxiety related 2. Antithrombotics: -DVT/anticoagulation:Mechanical:Sequential compression devices, below kneeBilateral lower extremities--dopplers ordered -antiplatelet therapy: N/a 3.Headaches/Pain Management:Fioricet or ultram [place]'t helped a great deal.  2/28 -increase topamax to 25mg  bid  3/1: Topamax increased to 50mg  bid  3/2: no increase in topamax today as his dose was just increased for the second consecutive day.    -encouraged relaxation, use of PRN's in meantime  4. Mood:LCSW to follow for evaluation and support. -antipsychotic agents: N/A 5. Neuropsych: This patientiscapable of making decisions on hisown behalf. 6. Skin/Wound Care:Routine pressure relief measures. 7. Fluids/Electrolytes/Nutrition:Monitor I/O. Offer nectar liquids between meals to maintain adequate hydration. 8. HTN; Monitor BP tid-continue Hydralazine, Cozaar, Coreg, Catapres and Norvasc. TItrateas indicated--SBP goal<140.  2/28 follow for consistent pattern  3/1-2: better controlled.  9. Prediabetes: Hgb A1C- 6.2--was 5.7 eight months ago. Will have RD educate on CM diet--- monitor BS ac/hs with SSI for now.   2/28 fair control at present  3/1-2: well controlled.  10. Tobacco/Cannabis use: Reports that he plans on quitting.  11. Dyslipidemia: Statin on hold due to ICH--resume at discharge. 12. Dysphagia: doesn't like consistency but is tolerating D2/nectars so far  LOS: 3 days A FACE TO FACE EVALUATION WAS PERFORMED  Meredith Staggers 09/27/2019, 10:35 AM

## 2019-09-27 NOTE — Progress Notes (Signed)
Speech Language Pathology Daily Session Note  Patient Details  Name: Paul [name] MRN: [id number] Date of Birth: 18-May-1966  Today's Date: 09/27/2019 SLP Individual Time: [id number] SLP Individual Time Calculation (min): 55 min  Short Term Goals: Week 1: SLP Short Term Goal 1 (Week 1): Pt will consume current diet with minmal overt s/sx aspiration and demonstrate efficient mastication and oral clearance with Supervision A verbal cues for use of swallow strategies. SLP Short Term Goal 2 (Week 1): Pt will consume ice/thin H2O trials X3 with minimal overt s/sx aspiration prior to repeat MBSS. SLP Short Term Goal 3 (Week 1): Pt will demonstrate ability to problem solve during mildly complex functional tasks with Min A verbal/visual cues. SLP Short Term Goal 4 (Week 1): Pt will demonstrate executive functioning skills (organization, planning, reasoning) during functional tasks with Min A verbal/visual cues. SLP Short Term Goal 5 (Week 1): Pt will use speech intelligibility strategies to increase intelligibility to 90% at the conversation level with Min A cues.  Skilled Therapeutic Interventions: Skilled treatment session focused on dysphagia and cognitive goals. SLP facilitated session by providing skilled observation with breakfast meal of Dys. 2 textures with nectar-thick liquids. Patient required Mod verbal cues to sustain attention to bolus resulting in mild oral residue that patient required liquid washes to clear. Recommend patient continue current diet with full supervision. Patient appeared internally distracted and required Mod verbal cues for sustained attention to self-feeding. Patient also demonstrated increased vocal intensity today to achieve ~90% intelligibility.  Towards end of session, patient became tearful and appeared anxious while reporting "numbness and chills" in right side of his body which is how he felt "when he had the first stroke." RN and PA aware and are addressing. Patient left  upright in bed with RN present. Continue with current plan of care.       Pain Pain Assessment Pain Scale: 0-10 Pain Score: 9  Pain Type: Acute pain Pain Location: Head Pain Orientation: Right Pain Descriptors / Indicators: Headache Pain Frequency: Constant Pain Onset: On-going Pain Intervention(s): Medication (See eMAR)  Therapy/Group: Individual Therapy  Dayelin Balducci 09/27/2019, 11:38 AM

## 2019-09-27 NOTE — Plan of Care (Signed)
  Problem: Consults Goal: RH GENERAL PATIENT EDUCATION Description: See Patient Education module for education specifics. Outcome: Progressing Goal: Skin Care Protocol Initiated - if Braden Score 18 or less Description: If consults are not indicated, leave blank or document N/A Outcome: Progressing Goal: Nutrition Consult-if indicated Outcome: Progressing Goal: Diabetes Guidelines if Diabetic/Glucose > 140 Description: If diabetic or lab glucose is > 140 mg/dl - Initiate Diabetes/Hyperglycemia Guidelines & Document Interventions  Outcome: Progressing   Problem: RH BOWEL ELIMINATION Goal: RH STG MANAGE BOWEL WITH ASSISTANCE Description: STG Manage Bowel with Assistance. Outcome: Progressing Goal: RH STG MANAGE BOWEL W/MEDICATION W/ASSISTANCE Description: STG Manage Bowel with Medication with Assistance. Outcome: Progressing   Problem: RH BLADDER ELIMINATION Goal: RH STG MANAGE BLADDER WITH ASSISTANCE Description: STG Manage Bladder With Assistance Outcome: Progressing Goal: RH STG MANAGE BLADDER WITH MEDICATION WITH ASSISTANCE Description: STG Manage Bladder With Medication With Assistance. Outcome: Progressing Goal: RH STG MANAGE BLADDER WITH EQUIPMENT WITH ASSISTANCE Description: STG Manage Bladder With Equipment With Assistance Outcome: Progressing   Problem: RH SKIN INTEGRITY Goal: RH STG SKIN FREE OF INFECTION/BREAKDOWN Outcome: Progressing Goal: RH STG MAINTAIN SKIN INTEGRITY WITH ASSISTANCE Description: STG Maintain Skin Integrity With Assistance. Outcome: Progressing Goal: RH STG ABLE TO PERFORM INCISION/WOUND CARE W/ASSISTANCE Description: STG Able To Perform Incision/Wound Care With Assistance. Outcome: Progressing   Problem: RH SAFETY Goal: RH STG ADHERE TO SAFETY PRECAUTIONS W/ASSISTANCE/DEVICE Description: STG Adhere to Safety Precautions With Assistance/Device. Outcome: Progressing Goal: RH STG DECREASED RISK OF FALL WITH ASSISTANCE Description: STG  Decreased Risk of Fall With Assistance. Outcome: Progressing   Problem: RH PAIN MANAGEMENT Goal: RH STG PAIN MANAGED AT OR BELOW PT'S PAIN GOAL Outcome: Progressing   Problem: RH KNOWLEDGE DEFICIT GENERAL Goal: RH STG INCREASE KNOWLEDGE OF SELF CARE AFTER HOSPITALIZATION Outcome: Progressing   Problem: RH Pre-functional/Other (Specify) Goal: RH LTG Pre-functional (Specify) Outcome: Progressing Goal: RH LTG Interdisciplinary (Specify) 1 Description: RH LTG Interdisciplinary (Specify)1 Outcome: Progressing Goal: RH LTG Interdisciplinary (Specify) 2 Description: RH LTG Interdisciplinary (Specify) 2  Outcome: Progressing

## 2019-09-27 NOTE — Patient Care Conference (Signed)
Inpatient RehabilitationTeam Conference and Plan of Care Update Date: 09/27/2019   Time: 10:35 AM    Patient Name: Paul [name]      Medical Record Number: [id number]  Date of Birth: 07-Dec-1965 Sex: Male         Room/Bed: 4W14C/4W14C-01 Payor Info: Payor: MEDICAID POTENTIAL / Plan: MEDICAID POTENTIAL / Product Type: *No Product type* /    Admit Date/Time:  09/24/2019  3:47 PM  Primary Diagnosis:  Thalamic hemorrhage [facility])  Patient Active Problem List   Diagnosis Date Noted  . Thalamic hemorrhage (HCC) 09/24/2019  . Cerebral edema (HCC)   . Pure hypercholesterolemia   . ICH (intracerebral hemorrhage) (HCC) 09/17/2019  . Acute CVA (cerebrovascular accident) (HCC) 01/18/2019  . Thrombocytosis (HCC) 01/18/2019  . Pancreatitis 01/07/2019  . Chronic diastolic CHF (congestive heart failure) (HCC) 10/11/2018  . CKD (chronic kidney disease) stage 2, GFR 60-89 ml/min 10/11/2018  . Essential hypertension 05/22/2018  . Dyslipidemia 05/22/2018  . Renal lesion 05/22/2018  . Coronary artery calcification 05/22/2018  . Tobacco abuse 05/22/2018    Expected Discharge Date: Expected Discharge Date: 10/21/19  Team Members Present: Physician leading conference: Dr. Faith Rogue Care Coodinator Present: Cecile Sheerer, LCSWA;Genie Marilin Kofman, RN, MSN Nurse Present: Willey Blade, RN PT Present: Aleda Grana, PT OT Present: Kearney Hard, OT SLP Present: Feliberto Gottron, SLP PPS Coordinator present : Fae Pippin, [place], PT     Current Status/Progress Goal Weekly Team Focus  Bowel/Bladder   Continent bowel and bladder,  remain continent of bowel and bladder  QS/PRN assessment, bladder scan prn, I/O cath PRN >350 pvr   Swallow/Nutrition/ Hydration   Dys. 2 textures with nectar-thick liquids, Min A/water protocol  Mod I  trials of thin liquids, use of swallowing compensatory strategies   ADL's   Max A overall, mod/max A sitting balance, poor safety awareness, impulsive  CGA/min A   self-care retraining, transfers, sitting balance, L NMR, NMES   Mobility   mod assist bed mobility, max assist bed<>w/c, up to mod assist w/c mobility, min<>mod assist unsupported sitting balance, impulsive  currently supervision<>CGA overall at ambulatory level, min assist stairs with 1 rail + LRAD  sitting/standing balance, postural control, w/c mobility, bed mobility, bed<>w/c transfers, gait as able   Communication   Mod-Max A  Mod I  use of speech intelligibility strategies   Safety/Cognition/ Behavioral Observations  Min-Mod A  Mod I  complex problem solving   Pain   PRN tylenol, tramadol, fioricet for pain  Pain remain <4/10  Assess pain QS/PRN   Skin   No evidence of skin breakdown  Maintain skin integrity  QS/PRN assessment of skin, Q2 turn    Rehab Goals Patient on target to meet rehab goals: Yes *See Care Plan and progress notes for long and short-term goals.     Barriers to Discharge  Current Status/Progress Possible Resolutions Date Resolved   Nursing  Behavior               PT  Home environment access/layout;Behavior;Nutrition means;Decreased caregiver support  3 STE & a flight of stairs to bed/bath on 2nd level, impulsive, unsure if family can provide 24 hr assist              OT Medical stability                SLP                SW  Discharge Planning/Teaching Needs:  D/c to his brother's home who will provide 24/7 care  Family education as recommended by therapy   Team Discussion: 54 yo R thalamic hemorrhage, + marijuana, L hemi, dysphagia, anxiety.  RN A+O, numbness/cold R side, BP 143/79, cathed today, anxiety.  OT max a ADLs, heavy pusher.  PT total to mod a transfers, min/mod sitting balance, goals CGA/min a.  SLP D2nectar, coughing, on water protocol, speech improved today.   Revisions to Treatment Plan: N/A     Medical Summary Current Status: right thalamic infarct with left hemiparesis, inattention/sensory loss. polysubstance abuse.  headaches, anxiety Weekly Focus/Goal: stroke education, pain control, bp mgt  Barriers to Discharge: Behavior;Medical stability       Continued Need for Acute Rehabilitation Level of Care: The patient requires daily medical management by a physician with specialized training in physical medicine and rehabilitation for the following reasons: Direction of a multidisciplinary physical rehabilitation program to maximize functional independence : Yes Medical management of patient stability for increased activity during participation in an intensive rehabilitation regime.: Yes Analysis of laboratory values and/or radiology reports with any subsequent need for medication adjustment and/or medical intervention. : Yes   I attest that I was present, lead the team conference, and concur with the assessment and plan of the team.   Trish Mage 09/29/2019, 9:53 AM   Team conference was held via web/ teleconference due to COVID - 19

## 2019-09-27 NOTE — Progress Notes (Signed)
Occupational Therapy Session Note  Patient Details  Name: Paul [name] MRN: [id number] Date of Birth: 1965/10/22  Today's Date: 09/27/2019 OT Individual Time: 1102-1200 OT Individual Time Calculation (min): 58 min    Short Term Goals: Week 1:  OT Short Term Goal 1 (Week 1): Pt will don overhead shirt with Mod A OT Short Term Goal 2 (Week 1): Pt will complete BSC or toilet transfer with 1 assist OT Short Term Goal 3 (Week 1): Pt will complete shower transfer with 1 assist  Skilled Therapeutic Interventions/Progress Updates:    Pt greeted semi-reclined in bed asleep, but easy to wake and agreeable to OT treatment session. Pt came to sitting EOB with max A to advance L side and elevate trunk into sitting. Pt completed stand-pivot to wc on stronger R side with Mod A. Pt brought to the sink for BADL tasks. OT located pt's Copy. Worked on pt shaving using mirror feedback to maintain midline. Pt initially leaning far to the L, requiring OT cues for head turns for vision. Incorporated visual scanning to L throughout BADL tasks. Sit<>stand with mod A, the mod/max A for dynamic balance while reaching behind to wash buttocks. OT faciliatted weight bearing through L UE in standing as well for neuro re-ed. Blocked practice for hemi dressing tasks with pt often perseverative and internally distracted requiring max cues for sequencing and initiation. Pt left seated in wc at end of session with alarm belt on, call bell in reach,and needs met.   Therapy Documentation Precautions:  Precautions Precautions: Fall Precaution Comments: left sided weakness and left lateral lean, SBP <160 Restrictions Weight Bearing Restrictions: No Other Position/Activity Restrictions: L PRAFO and WHO donned at rest General:   Vital Signs: Therapy Vitals Temp: 98.5 F (36.9 C) Temp Source: Oral Pulse Rate: (!) 56 Resp: 14 BP: 105/68 Patient Position (if appropriate): Lying Oxygen Therapy SpO2: 98 % O2 Device:  Room Air Pain: Pain Assessment Pain Scale: 0-10 Patient reports no pain   Therapy/Group: Individual Therapy  Valma Cava 09/27/2019, 11:24 AM

## 2019-09-27 NOTE — Progress Notes (Signed)
Pt c/o of numbness to right upper ext and being cold, reports same as when had stroke, notified PA, VS q 51min and Neuro checks q 4. VS documented. Blankets placed on pt, call bell in reach, bed alarm on and activated.

## 2019-09-28 ENCOUNTER — Inpatient Hospital Stay (HOSPITAL_COMMUNITY): Payer: Self-pay | Admitting: Occupational Therapy

## 2019-09-28 ENCOUNTER — Inpatient Hospital Stay (HOSPITAL_COMMUNITY): Payer: Self-pay

## 2019-09-28 ENCOUNTER — Inpatient Hospital Stay (HOSPITAL_COMMUNITY): Payer: Self-pay | Admitting: Speech Pathology

## 2019-09-28 ENCOUNTER — Inpatient Hospital Stay (HOSPITAL_COMMUNITY): Payer: Self-pay | Admitting: Physical Therapy

## 2019-09-28 LAB — GLUCOSE, CAPILLARY
Glucose-Capillary: 111 mg/dL — ABNORMAL HIGH (ref 70–99)
Glucose-Capillary: 140 mg/dL — ABNORMAL HIGH (ref 70–99)
Glucose-Capillary: 102 mg/dL — ABNORMAL HIGH (ref 70–99)

## 2019-09-28 NOTE — Progress Notes (Addendum)
Physical Therapy Session Note  Patient Details  Name: Paul [name] MRN: [id number] Date of Birth: [birth year]  Today's Date: 09/28/2019 PT Individual Time: 1007-1100 and [id number] PT Individual Time Calculation (min): 53 min and 57 min  Short Term Goals: Week 1:  PT Short Term Goal 1 (Week 1): Patient will perform basic transfers with min A consistently using LRAD. PT Short Term Goal 2 (Week 1): Patient will initiate gait training. PT Short Term Goal 3 (Week 1): Patient will tolerate standing activities >2 min.  Skilled Therapeutic Interventions/Progress Updates:  Treatment 1: Pt received in w/c & asking to get in bed with therapist educating him on scheduled PT session & pt agreeable. Pt requesting water so therapist provided him with thickened liquid & provided supervision while pt consumed small sips of liquid. Sit<>stand in stedy with max assist +1 and dependent assist for donning lite gait harness. Gait training in lite gait with improvement compared to yesterday in pt's ability to attend to task, activate RLE to stand, ability to advance RLE with max cuing. Therapist provides dependent assist for advancing LLE, blocking L knee in stance phase to prevent buckling, weight shifting R to allow increased ease of L foot advancement, and use of lite gait to promote anterior L hip. Pt with significant impairment in weight shifting R during gait so had pt stand in lite gait and reach for objects overhead to R to promote increased weight shifting. Placed towel roll in L side of w/c seat to promote proper neutral alignment in sitting. At end of session pt left in w/c with chair alarm donned, sitting at nurses station 2/2 maintenance work occurring in his room.   Treatment 2: Pt received in bed requiring encouragement but then agreeable to tx. Pt transfers supine>sitting EOB with mod assist with therapist assisting LLE to EOB & uprighting trunk. Pt transfers to w/c with stedy, transferring sit>stand with mod  assist and requiring min/mod assist for sitting balance in stedy. Utilized standing frame with focus on LLE weight bearing for strengthening & NMR, midline orientation, trunk control, and focusing on weight shifting R to carry over during standing without AD & gait. Pt engaged in reaching overhead to R to obtain cards then place them on velcro board placed in front of him with focus on scanning to L of midline with pt matching all cards correctly but pt demonstrating impaired trunk control at times as pt will just lunge for card vs controlling movement. Pt also irritated during activity voicing he's not doing well with therapist providing encouragement. Therapist loosened standing frame sling slightly & pt performed mini squats with focus on strengthening and anterior pelvic shifting to return to upright standing. Once back in w/c pt requests water & therapist provides pt with thickened liquid. Pt propels w/c around nurses station with R hemi technique & max assist for steering 2/2 L inattention & overall impaired attention to task & safety awareness, with pt requiring MAX cuing but poor awareness of L side of environment. At end of session pt left in handoff to OT.  Pain: 9/10 throbbing HA, attempted to distract pt during session & pain meds requested from nurse   Therapy Documentation Precautions:  Precautions Precautions: Fall Precaution Comments: left sided weakness and left lateral lean, SBP <160 Restrictions Weight Bearing Restrictions: No Other Position/Activity Restrictions: L PRAFO and WHO donned at rest    Therapy/Group: Individual Therapy  [name] 09/28/2019, 3:20 PM

## 2019-09-28 NOTE — Progress Notes (Signed)
Paul [name] PHYSICAL MEDICINE & REHABILITATION PROGRESS NOTE   Subjective/Complaints: Continues to have headaches. Topiramate did not provide much relief.  Moving bowels regularly. Sleep also affected by headaches.   ROS: Patient denies fever, rash, sore throat, blurred vision, nausea, vomiting, diarrhea, cough, shortness of breath or chest pain,   or mood change.    Objective:   No results found. Recent Labs    09/26/19 0605  WBC 7.4  HGB 13.4  HCT 40.6  PLT 386   Recent Labs    09/26/19 0605  NA 139  K 3.9  CL 103  CO2 25  GLUCOSE 119*  BUN 25*  CREATININE 1.08  CALCIUM 9.7    Intake/Output Summary (Last 24 hours) at 09/28/2019 1409 Last data filed at 09/28/2019 1300 Gross per 24 hour  Intake 340 ml  Output 900 ml  Net -560 ml     Physical Exam: Vital Signs Blood pressure 119/74, pulse (!) 55, temperature 97.9 F (36.6 C), temperature source Oral, resp. rate 18, height 6' (1.829 m), weight 72.7 kg, SpO2 96 %. Constitutional: No distress . Vital signs reviewed. Sitting up in bed working with therapy.  HEENT: EOMI, oral membranes moist Neck: supple Cardiovascular: RRR without murmur. No JVD    Respiratory/Chest: CTA Bilaterally without wheezes or rales. Normal effort    GI/Abdomen: BS +, non-tender, non-distended Ext: no clubbing, cyanosis, or edema.  Musculoskeletal:  General: No edema.Normal range of motion.  Cervical back: Normal range of motion.  Neurological: He isalertand oriented to person, place, and time. Left central 7, left HH and neglect. Dysarthric. Fair insight and awareness. Language appears intact. LUE 0/5 LLE 0/5--no initiation. Senses pain on left. Moves RUE and RLE spontaneously. Skin: Skin iswarmand dry.No rashnoted. He is not diaphoretic. Noerythema.  Psychiatric: pleasant, confused, makes good eye contact and shakes hand.   Assessment/Plan: 1. Functional deficits secondary to right thalamic hemorrhage which require 3+  hours per day of interdisciplinary therapy in a comprehensive inpatient rehab setting.  Physiatrist is providing close team supervision and 24 hour management of active medical problems listed below.  Physiatrist and rehab team continue to assess barriers to discharge/monitor patient progress toward functional and medical goals  Care Tool:  Bathing    Body parts bathed by patient: Chest, Abdomen, Front perineal area, Buttocks, Right upper leg, Left upper leg, Face   Body parts bathed by helper: Right arm, Left arm, Right lower leg, Left lower leg     Bathing assist Assist Level: Maximal Assistance - Patient 24 - 49%     Upper Body Dressing/Undressing Upper body dressing   What is the patient wearing?: Pull over shirt    Upper body assist Assist Level: Maximal Assistance - Patient 25 - 49%    Lower Body Dressing/Undressing Lower body dressing      What is the patient wearing?: Underwear/pull up, Pants     Lower body assist Assist for lower body dressing: Total Assistance - Patient < 25%     Toileting Toileting Toileting Activity did not occur Landscape architect and hygiene only): N/A (no void or bm)  Toileting assist Assist for toileting: Dependent - Patient 0%     Transfers Chair/bed transfer  Transfers assist     Chair/bed transfer assist level: 2 Helpers     Locomotion Ambulation   Ambulation assist   Ambulation activity did not occur: Safety/medical concerns(Increased R hip pain in standing, pt unable to tolerance standing)  Assist level: 2 helpers Assistive device: Lite Gait Max  distance: 7 ft   Walk 10 feet activity   Assist  Walk 10 feet activity did not occur: Safety/medical concerns(Increased R hip pain in standing, pt unable to tolerance standing)        Walk 50 feet activity   Assist Walk 50 feet with 2 turns activity did not occur: Safety/medical concerns(Increased R hip pain in standing, pt unable to tolerance standing)          Walk 150 feet activity   Assist Walk 150 feet activity did not occur: Safety/medical concerns(Increased R hip pain in standing, pt unable to tolerance standing)         Walk 10 feet on uneven surface  activity   Assist Walk 10 feet on uneven surfaces activity did not occur: Safety/medical concerns(Increased R hip pain in standing, pt unable to tolerance standing)         Wheelchair     Assist   Type of Wheelchair: Manual    Wheelchair assist level: Moderate Assistance - Patient 50 - 74% Max wheelchair distance: 75 ft    Wheelchair 50 feet with 2 turns activity    Assist    Wheelchair 50 feet with 2 turns activity did not occur: Safety/medical concerns(unable without skilled intervention)   Assist Level: Moderate Assistance - Patient 50 - 74%   Wheelchair 150 feet activity     Assist  Wheelchair 150 feet activity did not occur: Safety/medical concerns(unable without skilled intervention)       Blood pressure 119/74, pulse (!) 55, temperature 97.9 F (36.6 C), temperature source Oral, resp. rate 18, height 6' (1.829 m), weight 72.7 kg, SpO2 96 %.  Medical Problem List and Plan: 1.Left hemiparesis and visual-spatial deficitssecondary to right thalamic hemorrhage with intraventricular extension -patient may shower -ELOS: 3/26 -Continue PT, OT, SLP -[facility] for LUE and LLE  -suspect tingling in right arm was somewhat anxiety related 2. Antithrombotics: -DVT/anticoagulation:Mechanical:Sequential compression devices, below kneeBilateral lower extremities--dopplers ordered -antiplatelet therapy: N/a 3.Headaches/Pain Management:Fioricet or ultram prnhaven't helped a great deal.  2/28 -increase topamax to 25mg  bid  3/1: Topamax increased to 50mg  bid  3/2: no increase in topamax today as his dose was just increased for the second consecutive day.    -encouraged  relaxation, use of PRN's in meantime  4. Mood:LCSW to follow for evaluation and support. -antipsychotic agents: N/A 5. Neuropsych: This patientiscapable of making decisions on hisown behalf. 6. Skin/Wound Care:Routine pressure relief measures. 7. Fluids/Electrolytes/Nutrition:Monitor I/O. Offer nectar liquids between meals to maintain adequate hydration. 8. HTN; Monitor BP tid-continue Hydralazine, Cozaar, Coreg, Catapres and Norvasc. TItrateas indicated--SBP goal<140.  2/28 follow for consistent pattern  3/1-2: better controlled.  9. Prediabetes: Hgb A1C- 6.2--was 5.7 eight months ago. Will have RD educate on CM diet--- monitor BS ac/hs with SSI for now.   2/28 fair control at present  3/1-3: well controlled.  10. Tobacco/Cannabis use: Reports that he plans on quitting.  11. Dyslipidemia: Statin on hold due to ICH--resume at discharge. 12. Dysphagia: doesn't like consistency but is tolerating D2/nectars so far   LOS: 4 days A FACE TO FACE EVALUATION WAS PERFORMED  Paul [name] 09/28/2019, 2:09 PM

## 2019-09-28 NOTE — Progress Notes (Signed)
Physical Therapy Session Note  Patient Details  Name: Paul [name] MRN: [id number] Date of Birth: 07-28-1966  Today's Date: 09/28/2019 PT Individual Time: [id number] PT Individual Time Calculation (min): 28 min   Short Term Goals: Week 1:  PT Short Term Goal 1 (Week 1): Patient will perform basic transfers with min A consistently using LRAD. PT Short Term Goal 2 (Week 1): Patient will initiate gait training. PT Short Term Goal 3 (Week 1): Patient will tolerate standing activities >2 min.  Skilled Therapeutic Interventions/Progress Updates:Pt presents sitting in w/c and asking to use BR.  Pt wheeled into BR and performed sit to stand w/ max A and blocking left knee, SPT to 3 in 1 commode over toilet w/ max A.  Pt performed sit to stand from toilet w/ max A and verbal cues using hand rail and blocking left knee. Pt able to stand using arm rail x 5' for nursing to clean.  Pt returned to w/c and all needs close and seat alarm on next to bed.     Therapy Documentation Precautions:  Precautions Precautions: Fall Precaution Comments: left sided weakness and left lateral lean, SBP <160 Restrictions Weight Bearing Restrictions: No Other Position/Activity Restrictions: L PRAFO and WHO donned at rest General:   Vital Signs: Therapy Vitals Temp: 97.9 F (36.6 C) Temp Source: Oral Pulse Rate: (!) 55 Resp: 18 BP: 119/74 Patient Position (if appropriate): Lying Oxygen Therapy SpO2: 96 % O2 Device: Room Air Pain: pt states no pain Pain Assessment Pain Scale: 0-10 Pain Score: 7  Pain Type: Acute pain Pain Location: Head Pain Orientation: Right Pain Descriptors / Indicators: Headache Pain Frequency: Constant Pain Onset: On-going Patients Stated Pain Goal: 3 Pain Intervention(s): Medication (See eMAR) Multiple Pain Sites: No Mobility:       Therapy/Group: Individual Therapy  Ladoris Gene 09/28/2019, 9:52 AM

## 2019-09-28 NOTE — Progress Notes (Signed)
Occupational Therapy Session Note  Patient Details  Name: Paul [name] MRN: [id number] Date of Birth: Mar 01, 1966  Today's Date: 09/28/2019 OT Individual Time: [id number] OT Individual Time Calculation (min): 23 min    Short Term Goals: Week 1:  OT Short Term Goal 1 (Week 1): Pt will don overhead shirt with Mod A OT Short Term Goal 2 (Week 1): Pt will complete BSC or toilet transfer with 1 assist OT Short Term Goal 3 (Week 1): Pt will complete shower transfer with 1 assist  Skilled Therapeutic Interventions/Progress Updates:    1:1. Pt received in w/c with direct handoff to OT. Pt completes dynavision activity with 2x1 min trials with 3.1 seconds in L lower quadrant, 2.9 in L upper, and 1.62-9 on R quadrants. Pt educated on subluxation and positioning to prevent/optimize positioning. Pt completes stand pivot transfer back to bed with MAX A and L knee bock. Exited session with pt seate din bed, exit alarm on and call light in reach  Therapy Documentation   Precautions:  Precautions Precautions: Fall Precaution Comments: left sided weakness and left lateral lean, SBP <160 Restrictions Weight Bearing Restrictions: No Other Position/Activity Restrictions: L PRAFO and WHO donned at rest General:   Vital Signs:  Pain: Pain Assessment Pain Scale: 0-10 Pain Score: 7  Pain Type: Acute pain Pain Location: Head Pain Orientation: Right Pain Descriptors / Indicators: Headache Pain Frequency: Constant Pain Onset: On-going Pain Intervention(s): Medication (See eMAR) ADL: ADL Eating: Not assessed Grooming: Moderate assistance Where Assessed-Grooming: Edge of bed Upper Body Bathing: Moderate assistance Where Assessed-Upper Body Bathing: Edge of bed Lower Body Bathing: Maximal assistance Where Assessed-Lower Body Bathing: Edge of bed Upper Body Dressing: Maximal assistance Where Assessed-Upper Body Dressing: Edge of bed Lower Body Dressing: Maximal assistance Where Assessed-Lower Body  Dressing: Edge of bed Toileting: Not assessed Toilet Transfer: Not assessed Tub/Shower Transfer: Not assessed Vision   Perception    Praxis   Exercises:   Other Treatments:     Therapy/Group: Individual Therapy  Tonny Branch 09/28/2019, 2:55 PM

## 2019-09-28 NOTE — Progress Notes (Signed)
Speech Language Pathology Daily Session Note  Patient Details  Name: Lauris Umbach MRN: [id number] Date of Birth: [birth year]  Today's Date: 09/28/2019 SLP Individual Time: 0815-0900 SLP Individual Time Calculation (min): 45 min  Short Term Goals: Week 1: SLP Short Term Goal 1 (Week 1): Pt will consume current diet with minmal overt s/sx aspiration and demonstrate efficient mastication and oral clearance with Supervision A verbal cues for use of swallow strategies. SLP Short Term Goal 2 (Week 1): Pt will consume ice/thin H2O trials X3 with minimal overt s/sx aspiration prior to repeat MBSS. SLP Short Term Goal 3 (Week 1): Pt will demonstrate ability to problem solve during mildly complex functional tasks with Min A verbal/visual cues. SLP Short Term Goal 4 (Week 1): Pt will demonstrate executive functioning skills (organization, planning, reasoning) during functional tasks with Min A verbal/visual cues. SLP Short Term Goal 5 (Week 1): Pt will use speech intelligibility strategies to increase intelligibility to 90% at the conversation level with Min A cues.  Skilled Therapeutic Interventions: Skilled treatment session focused on dysphagia and cognitive goals. Upon arrival, patient was on the commode. SLP facilitated session by providing Min A verbal cues for safety with transfer from the commode to the wheelchair via the [place]. Patient consumed breakfast meal of Dys. 2 textures with nectar-thick liquids with intermittent overt s/s of aspiration and Mod A verbal cues for attention to self-feeding and to clear left pocketing and anterior spillage. Recommend patient continue current diet with full supervision. Patient lethargic throughout session requiring overall Min A verbal cues for arousal. Patient left upright in wheelchair with alarm on with NT present. Continue with current plan of care.      Pain Pain Assessment Pain Scale: 0-10 Pain Score: 7  Pain Type: Acute pain Pain Location: Head Pain  Orientation: Right Pain Descriptors / Indicators: Headache Pain Frequency: Constant Pain Onset: On-going Patients Stated Pain Goal: 3 Pain Intervention(s): Medication (See eMAR) Multiple Pain Sites: No  Therapy/Group: Individual Therapy  Benjaman Artman 09/28/2019, 9:04 AM

## 2019-09-28 NOTE — IPOC Note (Signed)
Overall Plan of Care [facility]) Patient Details Name: Dandre Rabel MRN: [id number] DOB: March 28, 1966  Admitting Diagnosis: Thalamic hemorrhage [facility])  Hospital Problems: Principal Problem:   Thalamic hemorrhage ([place])     Functional Problem List: Nursing Motor, Pain, Sensory  PT Balance, Behavior, Edema, Endurance, Motor, Nutrition, Pain, Perception, Safety, Sensory, Skin Integrity  OT Balance, Edema, Endurance, Motor, Pain, Perception, Safety, Sensory, Skin Integrity  SLP Cognition, Linguistic, Nutrition  TR         Basic ADL's: OT Grooming, Bathing, Dressing, Toileting     Advanced  ADL's: OT Simple Meal Preparation, Laundry     Transfers: PT Bed Mobility, Bed to Chair, Car, [employer], Futures trader, Metallurgist: PT Ambulation, Emergency planning/management officer, Stairs     Additional Impairments: OT Fuctional Use of Upper Extremity  SLP Social Cognition, Communication, Swallowing expression Problem Solving, Memory, Awareness  TR      Anticipated Outcomes Item Anticipated Outcome  Self Feeding No goal  Swallowing  Mod I   Basic self-care  Supervision-Min A  Toileting  Min A   Bathroom Transfers Supervision  Bowel/Bladder  Patient will be able to continue to be continent of bowel/bladder  Transfers  CGA using LRAD  Locomotion  CGA 50 ft using LRAD  Communication  Mod I  Cognition  Mod I  Pain  patient will remain at or below acceptable pain score of 3  Safety/Judgment  Patient will understand abilities and continue with safe judgement   Therapy Plan: PT Intensity: Minimum of 1-2 x/day ,45 to 90 minutes PT Frequency: 5 out of 7 days PT Duration Estimated Length of Stay: 3-4 weeks OT Intensity: Minimum of 1-2 x/day, 45 to 90 minutes OT Frequency: 5 out of 7 days OT Duration/Estimated Length of Stay: 3-4 weeks SLP Intensity: Minumum of 1-2 x/day, 30 to 90 minutes SLP Frequency: 3 to 5 out of 7 days SLP Duration/Estimated Length of Stay: 3-4 weeks    Due to the current state of emergency, patients may not be receiving their 3-hours of Medicare-mandated therapy.   Team Interventions: Nursing Interventions Pain Management, Cognitive Remediation/Compensation, Psychosocial Support  PT interventions Ambulation/gait training, Discharge planning, Functional mobility training, Psychosocial support, Therapeutic Activities, Visual/perceptual remediation/compensation, Balance/vestibular training, Disease management/prevention, Neuromuscular re-education, Skin care/wound management, Therapeutic Exercise, Wheelchair propulsion/positioning, Cognitive remediation/compensation, DME/adaptive equipment instruction, Pain management, Splinting/orthotics, UE/LE Strength taining/ROM, Community reintegration, Technical sales engineer stimulation, Patient/family education, IT trainer, UE/LE Coordination activities  OT Interventions Training and development officer, Engineer, drilling, Patient/family education, Therapeutic Activities, Wheelchair propulsion/positioning, Therapeutic Exercise, Psychosocial support, Functional electrical stimulation, Community reintegration, Functional mobility training, Self Care/advanced ADL retraining, UE/LE Strength taining/ROM, UE/LE Coordination activities, Neuromuscular re-education, Discharge planning, Disease mangement/prevention, Pain management, Visual/perceptual remediation/compensation  SLP Interventions Cognitive remediation/compensation, Speech/Language facilitation, Cueing hierarchy, Functional tasks, Internal/external aids, Dysphagia/aspiration precaution training, Patient/family education  TR Interventions    SW/CM Interventions     Barriers to Discharge MD  Medical stability  Nursing Behavior    PT Home environment access/layout, Behavior, Nutrition means, Decreased caregiver support 3 STE & a flight of stairs to bed/bath on 2nd level, impulsive, unsure if family can provide 24 hr assist  OT Medical  stability    SLP      SW       Team Discharge Planning: Destination: PT-Home ,OT- Home , SLP-Home Projected Follow-up: PT-Outpatient PT, OT-  Home health OT, SLP-Other (comment)(TBD) Projected Equipment Needs: PT- , OT- To be determined, SLP-None recommended by SLP Equipment Details: PT-Patient has no equipment, will continue  to assess needs as patient progresses, OT-  Patient/family involved in discharge planning: PT- Patient,  OT-Patient, SLP-Patient  MD ELOS: 3-4 weeks Medical Rehab Prognosis:  Excellent Assessment: The patient has been admitted for CIR therapies with the diagnosis of right thalamic hemorrhage with left hemiparesis. The team will be addressing functional mobility, strength, stamina, balance, safety, adaptive techniques and equipment, self-care, bowel and bladder mgt, patient and caregiver education, visual-spatial awareness, pain mgt, swallowing, speech, cognition and ego support. Goals have been set at supervision/min assist with self-care, CGA with mobilty and mod I with speech, cognition and swallowing .   Due to the current state of emergency, patients may not be receiving their 3 hours per day of Medicare-mandated therapy.    Meredith Staggers, MD, FAAPMR      See Team Conference Notes for weekly updates to the plan of care

## 2019-09-28 NOTE — Progress Notes (Signed)
Patient c/o headache which not relieved with scheduled Topamax last night. Tramadol was given at 2317 and patient stated it "doesn't hurt that much". Patient finally went to sleep around 1:30 AM and when woke up this AM with severe headache FIORICET administered d/t pain level was 7/10.

## 2019-09-28 NOTE — Progress Notes (Signed)
Social Work [name], male   DOB: January 17, 1966, 54 y.o.   MRN: [id number]   SW made contact with pt dtr Paul [name] [phone]) to provide updates from rehab team, and anticipated d/c date 10/21/2019. She reports that pt will be discharging to his brother Paul [name]'s home [phone]).  SW informed since pt is going to brother's home SW will coordinate all care needs with him.   SW spoke with pt brother Paul [name] to provide updates from rehab team and d/c date. SW discussed beginning to explore DME need such as w/c, BSC, TTB, and RW. He also reports his is making remodeling changes to the home.    Loralee Pacas, MSW, [place] Office: [phone] Cell: [phone] Fax: [phone]

## 2019-09-29 ENCOUNTER — Inpatient Hospital Stay (HOSPITAL_COMMUNITY): Payer: Self-pay

## 2019-09-29 ENCOUNTER — Inpatient Hospital Stay (HOSPITAL_COMMUNITY): Payer: Self-pay | Admitting: Speech Pathology

## 2019-09-29 ENCOUNTER — Inpatient Hospital Stay (HOSPITAL_COMMUNITY): Payer: Self-pay | Admitting: Occupational Therapy

## 2019-09-29 LAB — GLUCOSE, CAPILLARY
Glucose-Capillary: 105 mg/dL — ABNORMAL HIGH (ref 70–99)
Glucose-Capillary: 146 mg/dL — ABNORMAL HIGH (ref 70–99)
Glucose-Capillary: 122 mg/dL — ABNORMAL HIGH (ref 70–99)
Glucose-Capillary: 130 mg/dL — ABNORMAL HIGH (ref 70–99)

## 2019-09-29 NOTE — Progress Notes (Signed)
[facility] PHYSICAL MEDICINE & REHABILITATION PROGRESS NOTE   Subjective/Complaints: Had a better night. Headaches are present but improved. Working hard with therapy.   ROS: Patient denies fever, rash, sore throat, blurred vision, nausea, vomiting, diarrhea, cough, shortness of breath or chest pain, joint or back pain,   or mood change.   Objective:   No results found. No results for input(s): WBC, HGB, HCT, PLT in the last 72 hours. No results for input(s): NA, K, CL, CO2, GLUCOSE, BUN, CREATININE, CALCIUM in the last 72 hours.  Intake/Output Summary (Last 24 hours) at 09/29/2019 1019 Last data filed at 09/29/2019 0600 Gross per 24 hour  Intake 220 ml  Output 250 ml  Net -30 ml     Physical Exam: Vital Signs Blood pressure 129/69, pulse (!) 56, temperature 97.9 F (36.6 C), temperature source Oral, resp. rate 16, height 6' (1.829 m), weight 72.7 kg, SpO2 99 %. Constitutional: No distress . Vital signs reviewed. HEENT: EOMI, oral membranes moist Neck: supple Cardiovascular: RRR without murmur. No JVD    Respiratory/Chest: CTA Bilaterally without wheezes or rales. Normal effort    GI/Abdomen: BS +, non-tender, non-distended Ext: no clubbing, cyanosis, or edema Musculoskeletal:  General: No edema.Normal range of motion.  Cervical back: Normal range of motion.  Neurological: He isalertand oriented to person, place, and time. Left central 7, left HH and neglect ongoing. Unaware of food on left side of mouth/face. Dysarthric. Fair insight and awareness. Language appears intact. LUE 0/5 LLE 0/5--no initiation. Senses pain on left. Moves RUE and RLE 4-5/5.  Skin: Skin iswarmand dry.No rashnoted. He is not diaphoretic. Noerythema.  Psychiatric: pleasant, calmer  Assessment/Plan: 1. Functional deficits secondary to right thalamic hemorrhage which require 3+ hours per day of interdisciplinary therapy in a comprehensive inpatient rehab setting.  Physiatrist is  providing close team supervision and 24 hour management of active medical problems listed below.  Physiatrist and rehab team continue to assess barriers to discharge/monitor patient progress toward functional and medical goals  Care Tool:  Bathing    Body parts bathed by patient: Chest, Abdomen, Front perineal area, Buttocks, Right upper leg, Left upper leg, Face   Body parts bathed by helper: Right arm, Left arm, Right lower leg, Left lower leg     Bathing assist Assist Level: Maximal Assistance - Patient 24 - 49%     Upper Body Dressing/Undressing Upper body dressing   What is the patient wearing?: Pull over shirt    Upper body assist Assist Level: Maximal Assistance - Patient 25 - 49%    Lower Body Dressing/Undressing Lower body dressing      What is the patient wearing?: Underwear/pull up, Pants     Lower body assist Assist for lower body dressing: Total Assistance - Patient < 25%     Toileting Toileting Toileting Activity did not occur Landscape architect and hygiene only): N/A (no void or bm)  Toileting assist Assist for toileting: Dependent - Patient 0%     Transfers Chair/bed transfer  Transfers assist     Chair/bed transfer assist level: Dependent - mechanical lift     Locomotion Ambulation   Ambulation assist   Ambulation activity did not occur: Safety/medical concerns(Increased R hip pain in standing, pt unable to tolerance standing)  Assist level: 2 helpers Assistive device: Lite Gait Max distance: 7 ft   Walk 10 feet activity   Assist  Walk 10 feet activity did not occur: Safety/medical concerns(Increased R hip pain in standing, pt unable to tolerance standing)  Walk 50 feet activity   Assist Walk 50 feet with 2 turns activity did not occur: Safety/medical concerns(Increased R hip pain in standing, pt unable to tolerance standing)         Walk 150 feet activity   Assist Walk 150 feet activity did not occur:  Safety/medical concerns(Increased R hip pain in standing, pt unable to tolerance standing)         Walk 10 feet on uneven surface  activity   Assist Walk 10 feet on uneven surfaces activity did not occur: Safety/medical concerns(Increased R hip pain in standing, pt unable to tolerance standing)         Wheelchair     Assist   Type of Wheelchair: Manual    Wheelchair assist level: Maximal Assistance - Patient 25 - 49% Max wheelchair distance: 50 ft    Wheelchair 50 feet with 2 turns activity    Assist    Wheelchair 50 feet with 2 turns activity did not occur: Safety/medical concerns(unable without skilled intervention)   Assist Level: Maximal Assistance - Patient 25 - 49%   Wheelchair 150 feet activity     Assist  Wheelchair 150 feet activity did not occur: Safety/medical concerns(unable without skilled intervention)       Blood pressure 129/69, pulse (!) 56, temperature 97.9 F (36.6 C), temperature source Oral, resp. rate 16, height 6' (1.829 m), weight 72.7 kg, SpO2 99 %.  Medical Problem List and Plan: 1.Left hemiparesis and visual-spatial deficitssecondary to right thalamic hemorrhage with intraventricular extension -patient may shower -ELOS: 3/26 -Continue PT, OT, SLP -[facility] for LUE and LLE   2. Antithrombotics: -DVT/anticoagulation:Mechanical:Sequential compression devices, below kneeBilateral lower extremities--dopplers ordered -antiplatelet therapy: N/a 3.Headaches/Pain Management:Fioricet or ultram [place]'t helped a great deal.  2/28 -increase topamax to 25mg  bid  3/1: Topamax increased to 50mg  bid  3/2: no increase in topamax today as his dose was just increased for the second consecutive day.    -encouraged relaxation, use of PRN's in meantime   3/4 headaches improved today. Expect further improvement over time.  4. Mood:LCSW to follow for  evaluation and support. -antipsychotic agents: N/A 5. Neuropsych: This patientiscapable of making decisions on hisown behalf. 6. Skin/Wound Care:Routine pressure relief measures. 7. Fluids/Electrolytes/Nutrition:Monitor I/O. Offer nectar liquids between meals to maintain adequate hydration.  -intake good 8. HTN; Monitor BP tid-continue Hydralazine, Cozaar, Coreg, Catapres and Norvasc. TItrateas indicated--SBP goal<140.  3/4: better controlled.  9. Prediabetes: Hgb A1C- 6.2--was 5.7 eight months ago. Will have RD educate on CM diet--- monitor BS ac/hs with SSI for now.   2/28 fair control at present  3/1-3: well controlled.  10. Tobacco/Cannabis use: Reports that he plans on quitting.  11. Dyslipidemia: Statin on hold due to ICH--resume at discharge. 12. Dysphagia: doesn't like consistency but is tolerating D2/nectars so far   LOS: 5 days A FACE TO FACE EVALUATION WAS PERFORMED  Meredith Staggers 09/29/2019, 10:19 AM

## 2019-09-29 NOTE — Progress Notes (Signed)
Physical Therapy Session Note  Patient Details  Name: Paul [name] MRN: [id number] Date of Birth: 04-12-1966  Today's Date: 09/29/2019 PT Individual Time: 1305-1400 PT Individual Time Calculation (min): 55 min   Short Term Goals: Week 1:  PT Short Term Goal 1 (Week 1): Patient will perform basic transfers with min A consistently using LRAD. PT Short Term Goal 2 (Week 1): Patient will initiate gait training. PT Short Term Goal 3 (Week 1): Patient will tolerate standing activities >2 min. Week 2:    Week 3:     Skilled Therapeutic Interventions/Progress Updates:     PAIN c/o headache, unable to quantify, nursing provided pain meds just prior to rx.  Treatment to tolerance.  Pt initially OOB in wc and refusing PT session.  Pt encouraged by therapist and nursing to participate and initally agreed to shortened session if PT would put him back to bed following gait.  However, as session progressed pt continued to participate and full session tolerated.  Pt STS in [place] w/min assist/pulls up on [place] and relies on support to LLE to prevent balance loss.  Pt stand to semisit in stedy w/cga.  Transported to gym in [place] as pt refused to transfer to wc. Stedy to armchair w/min assist, assist to manage LUE/LLE w/transition.  STS in [place] w/mod assist for donning of harness, therapist stabilizing LUE and LLE.  Planned gait training, but scheduled assist not available so worked on standing balance, midline orientation, and wt shifting w/harness for safety, therapist stabilizing L knee/hip, L hand propped on [place] handle.   Worked on raising RUE repeated times to facilitate wt shifting to L, multimodal cues required to maintain upright posturee.  Pt perseverating on being put in bed but distracted by other activities in gym.  Performed STS total of 10x w/mod assist. Pt stood w/mod assist for removal of harness, rests in armchair.  Transfers to stedy w/min assist and cues for upright posture.   Transported back to room w/stedy.   Semisit to stand to full sit on edge of bed w/max cues for safety, mod assist due to impulsivity. Sit to supine w/mod assist. In supine pt performed bridging w/3 sec hold x 10 Clamshells w/resistance to RLE to facilitate use of LLE partial ROM x 3 then pt stated he needed to urinate. Pt requested to use urinal in standing.  Supine to side to sit w/mod assist.  Sit to stand w/mod assist and max assist for standing/stabilizing LLE/trunk L tendency.   Pt requires assist for positioning of urinal and holding of urinal.  Stood x 8 min as urination was slow to start and pt on/off w/urine stream.  continuous cues for upright posture/max assist for balance Stand to sit and sit to supine w/max assist. Pt left supine w/rails up x 3, alarm set, bed in lowest position, and needs in reach. .     Therapy Documentation Precautions:  Precautions Precautions: Fall Precaution Comments: left sided weakness and left lateral lean, SBP <160 Restrictions Weight Bearing Restrictions: No Other Position/Activity Restrictions: L PRAFO and WHO donned at rest    Therapy/Group: Individual Therapy  Callie Fielding, [place] 09/29/2019, 4:09 PM

## 2019-09-29 NOTE — Progress Notes (Signed)
Speech Language Pathology Daily Session Notes  Patient Details  Name: Paul [name] MRN: [id number] Date of Birth: 06/23/1966  Today's Date: 09/29/2019  Session 1: SLP Individual Time: [id number] SLP Individual Time Calculation (min): 25 min  Session 2: SLP Individual Time: 0900-0930 SLP Individual Time Calculation (min): 30 min  Short Term Goals: Week 1: SLP Short Term Goal 1 (Week 1): Pt will consume current diet with minmal overt s/sx aspiration and demonstrate efficient mastication and oral clearance with Supervision A verbal cues for use of swallow strategies. SLP Short Term Goal 2 (Week 1): Pt will consume ice/thin H2O trials X3 with minimal overt s/sx aspiration prior to repeat MBSS. SLP Short Term Goal 3 (Week 1): Pt will demonstrate ability to problem solve during mildly complex functional tasks with Min A verbal/visual cues. SLP Short Term Goal 4 (Week 1): Pt will demonstrate executive functioning skills (organization, planning, reasoning) during functional tasks with Min A verbal/visual cues. SLP Short Term Goal 5 (Week 1): Pt will use speech intelligibility strategies to increase intelligibility to 90% at the conversation level with Min A cues.  Skilled Therapeutic Interventions:  Session 1: Skilled treatment session focused on dysphagia and cognitive cues. SLP facilitated session by providing Min A verbal cues for cessation of oral care via the suction toothbrush. Patient consumed trials of thin liquids via tsp with overt s/s of aspiration in 10% of trials with Mod verbal cues needed to "swallow fast" and an appropriate labial seal around the spoon. Recommend continued trials with SLP. Patient reported he was confused throughout the session and required Max verbal cues for recall of date. Patient perseverative on calling his girlfriend but was easily redirected. Patient left upright in bed with alarm on and all needs within reach. Continue with current plan of care.   Session 2:  Skilled treatment session focused on speech goals. SLP facilitated session by providing overall Min A verbal cues for use of an increased vocal intensity, utilization of over-articulation and to maintain eye contact within his left visual field during a verbal description task at the sentence level. Patient was left upright in bed with alarm on and all needs within reach. Continue with current plan of care.   Pain No reports of pain   Therapy/Group: Individual Therapy  Riggins Cisek 09/29/2019, 12:44 PM

## 2019-09-29 NOTE — Progress Notes (Signed)
Occupational Therapy Session Note  Patient Details  Name: Paul [name] MRN: [id number] Date of Birth: 17-Feb-1966  Today's Date: 09/29/2019 OT Individual Time: [id number] OT Individual Time Calculation (min): 58 min   Short Term Goals: Week 1:  OT Short Term Goal 1 (Week 1): Pt will don overhead shirt with Mod A OT Short Term Goal 2 (Week 1): Pt will complete BSC or toilet transfer with 1 assist OT Short Term Goal 3 (Week 1): Pt will complete shower transfer with 1 assist  Skilled Therapeutic Interventions/Progress Updates:    Pt greeted asleep in bed, easy to wake, and agreeable to OT treatment session. Pt completed bed mobility with mod A. Squat-pivot to stronger R side with mod A. OT removed condom catheter for BADL tasks. Worked on visual scanning and L attention using mirror feedback within BADL tasks. Hand over hand A to integrate L UE with no activation noted. Sit<>stands at the sink with mod A and mod/max A for standing balance. Worked on anterior weight shift with reaching forward to thread R LE into pants. Pt needed max A to maintain trunk control when reaching forward and stepping with R LE. Pt left seated in wc with alarm belt on and L UE supported on 1/2 lap tray. OT placed SAEBO e-stim on wrist extensors. SAEBO left on for 60 minutes. OT returned to remove SAEBO with skin intact and no adverse reactions.  [place] Stim One 330 pulse width 35 Hz pulse rate On 8 sec/ off 8 sec Ramp up/ down 2 sec Symmetrical Biphasic wave form  Max intensity 186mA at 500 Ohm load  Therapy Documentation Precautions:  Precautions Precautions: Fall Precaution Comments: left sided weakness and left lateral lean, SBP <160 Restrictions Weight Bearing Restrictions: No Other Position/Activity Restrictions: L PRAFO and WHO donned at rest Pain:  denies pain, reports headache is better   Therapy/Group: Individual Therapy  Valma Cava 09/29/2019, 11:08 AM

## 2019-09-30 ENCOUNTER — Inpatient Hospital Stay (HOSPITAL_COMMUNITY): Payer: Self-pay | Admitting: Speech Pathology

## 2019-09-30 ENCOUNTER — Inpatient Hospital Stay (HOSPITAL_COMMUNITY): Payer: Self-pay | Admitting: Occupational Therapy

## 2019-09-30 ENCOUNTER — Inpatient Hospital Stay (HOSPITAL_COMMUNITY): Payer: Self-pay | Admitting: *Deleted

## 2019-09-30 ENCOUNTER — Inpatient Hospital Stay (HOSPITAL_COMMUNITY): Payer: Self-pay | Admitting: Physical Therapy

## 2019-09-30 LAB — GLUCOSE, CAPILLARY
Glucose-Capillary: 116 mg/dL — ABNORMAL HIGH (ref 70–99)
Glucose-Capillary: 105 mg/dL — ABNORMAL HIGH (ref 70–99)
Glucose-Capillary: 127 mg/dL — ABNORMAL HIGH (ref 70–99)
Glucose-Capillary: 106 mg/dL — ABNORMAL HIGH (ref 70–99)

## 2019-09-30 NOTE — Progress Notes (Addendum)
Physical Therapy Weekly Progress Note  Patient Details  Name: Paul [name] MRN: [id number] Date of Birth: September 16, 1965  Beginning of progress report period: September 25, 2019 End of progress report period: September 30, 2019  Today's Date: 09/30/2019 PT Individual Time: 7062-3762 PT Individual Time Calculation (min): 70 min   Patient has met 2 of 3 short term goals.  Pt is making slow progress towards LTG's. Pt requires mod assist for squat pivot transfers to R, up to +2 assist for transfers to L 2/2 L inattention & visual deficits. Pt has initiated w/c mobility but requires up to max assist 2/2 L inattention & visual deficits as well. Have utilized lite gait to address standing balance, midline orientation & gait for LLE NMR & strengthening. Pt continues to demonstrate significant functional mobility & cognitive impairments and will benefit from continued skilled PT treatment to focus on deficits noted above & below and to increase independence and safety awareness.  Patient continues to demonstrate the following deficits muscle weakness, decreased cardiorespiratoy endurance, decreased coordination and decreased motor planning, decreased visual acuity, decreased visual perceptual skills and hemianopsia, decreased attention to left and decreased motor planning, decreased attention, decreased awareness, decreased problem solving, decreased safety awareness, decreased memory and delayed processing, and decreased sitting balance, decreased standing balance, decreased postural control, decreased balance strategies and L hemiparesis and therefore will continue to benefit from skilled PT intervention to increase functional independence with mobility.  Patient progressing toward long term goals..  Continue plan of care.  PT Short Term Goals Week 1:  PT Short Term Goal 1 (Week 1): Patient will perform basic transfers with min A consistently using LRAD. PT Short Term Goal 1 - Progress (Week 1): Not met PT Short  Term Goal 2 (Week 1): Patient will initiate gait training. PT Short Term Goal 2 - Progress (Week 1): Met(with lite gait) PT Short Term Goal 3 (Week 1): Patient will tolerate standing activities >2 min. PT Short Term Goal 3 - Progress (Week 1): Met Week 2:  PT Short Term Goal 1 (Week 2): Pt will propel w/c 75 ft with mod assist. PT Short Term Goal 2 (Week 2): Pt will consistently complete transfers to L with mod assist. PT Short Term Goal 3 (Week 2): Pt will demonstrate standing balance with mod assist x 3 minutes.  Skilled Therapeutic Interventions/Progress Updates:  Pt received in bed & agreeable to tx. Supine<>sit with mod assist for LLE and to control trunk with use of hospital bed features. Sit<>stand with stedy with min assist +1 and transfers bed<>w/c with stedy lift. Donned lite gait harness with pt standing in stedy. Gait training with lite gait with total assist for LLE advancement, max verbal cuing for stepping pattern, upright posture, total manual assist for weight shifting R to allow increased ease of LLE foot advancement. Pt with improvement in upright posture when not holding to lite gait bar with RUE and instructional cuing to correct postural alignment with anterior pelvic shift vs using RUE to pull himself forward.  Worked on weight shifting to R in standing in lite gait with pt reaching overhead to R for objects. Pt requires encouragement & education during session as pt frustrated by "something simple".  Provided pt with thickened water upon request and cuing for small sips, pt with 1 episode of coughing after sip. Pt left in bed with alarm set & all needs in reach, floor mats in place.  Pain: pt c/o 9/10 HA & nurse administered pain meds at beginning session  Therapy Documentation Precautions:  Precautions Precautions: Fall Precaution Comments: left sided weakness and left lateral lean, SBP <160 Restrictions Weight Bearing Restrictions: No Other Position/Activity  Restrictions: L PRAFO and WHO donned at rest   Therapy/Group: Individual Therapy  [name] 09/30/2019, 3:37 PM

## 2019-09-30 NOTE — Progress Notes (Signed)
Nutrition Follow-up  DOCUMENTATION CODES:   Not applicable  INTERVENTION:  Please obtain updated weight.    Snacks TID  Continue Magic cup TID with meals, each supplement provides 290 kcal and 9 grams of protein  Continue Hormel shake TID, each supplement provides 520 kcal and 22 grams of protein   NUTRITION DIAGNOSIS:   Inadequate oral intake related to dysphagia as evidenced by meal completion < 50%.  Ongoing.  GOAL:   Patient will meet greater than or equal to 90% of their needs  Progressing.  MONITOR:   PO intake, Supplement acceptance, Diet advancement  REASON FOR ASSESSMENT:   Consult Diet education  ASSESSMENT:   Pt with a PMH of HTN, HLD, embolic stroke, tobacco and cannabis use who was admitted on 09/17/19 with right thalamic hemorrhage with IVH and he was transferred to [facility] for management. Pt now with improving mentation and diet advanced to dysphagia 2, nectar liquids. Pt with limitations due to left hemiplegia with left inattention, difficulty with multi-step commands but showing improvement in attention. Admitted to CIR on 2/27.  Pt providing limited responses to RD questions, C/O headache. Pt states that his appetite is "okay." Per MD, pt does not like the consistency of his current diet order. RD encouraged pt to consume as much of his supplements as possible and stressed importance of adequate protein intake; pt provided RD a thumbs up in response.   PO intake: 10-60% x last 7 recorded meals (27% average intake)  No updated weight since last RD assessment on 3/1.   Medications reviewed and include: MVI, Senokot-S  Labs reviewed. CBGs 105-116  UOP: 465ml x24 hours I/O: -3,579ml since admit  NUTRITION - FOCUSED PHYSICAL EXAM:    Most Recent Value  Orbital Region  No depletion  Upper Arm Region  No depletion  Thoracic and Lumbar Region  No depletion  Buccal Region  No depletion  Temple Region  No depletion  Clavicle Bone Region  No  depletion  Clavicle and Acromion Bone Region  No depletion  Scapular Bone Region  No depletion  Dorsal Hand  No depletion  Patellar Region  No depletion  Anterior Thigh Region  No depletion  Posterior Calf Region  No depletion  Edema (RD Assessment)  None  Hair  Reviewed  Eyes  Reviewed  Mouth  Reviewed  Skin  Reviewed  Nails  Reviewed       Diet Order:   Diet Order            DIET DYS 2 Room service appropriate? Yes; Fluid consistency: Nectar Thick  Diet effective now              EDUCATION NEEDS:   Education needs have been addressed  Skin:  Skin Assessment: Reviewed RN Assessment  Last BM:  3/3  Height:   Ht Readings from Last 1 Encounters:  09/25/19 6' (1.829 m)    Weight:   Wt Readings from Last 1 Encounters:  09/25/19 72.7 kg    BMI:  Body mass index is 21.74 kg/m.  Estimated Nutritional Needs:   Kcal:  2100-2300  Protein:  95-110 grams  Fluid:  2L/d    Larkin Ina, MS, RD, LDN RD pager number and weekend/on-call pager number located in [place].

## 2019-09-30 NOTE — Progress Notes (Signed)
Occupational Therapy Session Note  Patient Details  Name: Paul [name] MRN: [id number] Date of Birth: 05-05-1966  Today's Date: 09/30/2019 OT Individual Time: 1003-1100 OT Individual Time Calculation (min): 57 min    Short Term Goals: Week 1:  OT Short Term Goal 1 (Week 1): Pt will don overhead shirt with Mod A OT Short Term Goal 2 (Week 1): Pt will complete BSC or toilet transfer with 1 assist OT Short Term Goal 3 (Week 1): Pt will complete shower transfer with 1 assist  Skilled Therapeutic Interventions/Progress Updates:    Pt greeted supine in bed, holding head, reporting 9/10 headache. Pt agreeable to still participate in therapy despite headache. Rec Therapist present throughout session to establish rec therapy goals of care and discuss pt preferred activities. Pt came to sitting EOB with Max A for L side management. Stedy used for min A sit<>stand to transfer onto tub bench in shower. Pt needed frequent verbal cues for sitting balance as pt leans to the R. Pt stated his hip was also bothering him sitting on bench in shower. Pt initiated bathing tasks and used cut out in bench to wash peri-area and buttocks. Tub bench positioned facing out to have walls of support on both sides 2/2 poor sitting balance.  Min/mod A sit<>stand in [place] to transfer out of shower. Worked on visual scanning to the L to locate Copy and deodorant with min cues. Pt needed verbal cues to shave L side of face. Pt very hard on himself throughout session, often stating "i'm pitiful." OT provided encouragement and helped pt come up with 3 positive things that happened this session. Multiple sit<.stands at the isnk with  Min A, then mod/max A for balance. .OT placed SAEBO e-stim on wrist extensors. SAEBO left on for 60 minutes. OT returned to remove SAEBO with skin intact and no adverse reactions.  [place] Stim One 330 pulse width 35 Hz pulse rate On 8 sec/ off 8 sec Ramp up/ down 2 sec Symmetrical Biphasic wave  form  Max intensity 165mA at 500 Ohm load   Therapy Documentation Precautions:  Precautions Precautions: Fall Precaution Comments: left sided weakness and left lateral lean, SBP <160 Restrictions Weight Bearing Restrictions: No Other Position/Activity Restrictions: L PRAFO and WHO donned at rest Pain: Pain Assessment Pain Scale: Faces Faces Pain Scale: Hurts whole lot Pain Type: Acute pain Pain Location: Head Pain Orientation: Right;Mid Pain Descriptors / Indicators: Headache Pain Frequency: Constant Pain Onset: On-going Pain Intervention(s): Repositioned   Therapy/Group: Individual Therapy  Valma Cava 09/30/2019, 11:05 AM

## 2019-09-30 NOTE — Progress Notes (Signed)
Speech Language Pathology Daily Session Note  Patient Details  Name: Paul [name] MRN: [id number] Date of Birth: 29-Aug-1965  Today's Date: 09/30/2019 SLP Individual Time: [id number] SLP Individual Time Calculation (min): 45 min  Short Term Goals: Week 1: SLP Short Term Goal 1 (Week 1): Pt will consume current diet with minmal overt s/sx aspiration and demonstrate efficient mastication and oral clearance with Supervision A verbal cues for use of swallow strategies. SLP Short Term Goal 2 (Week 1): Pt will consume ice/thin H2O trials X3 with minimal overt s/sx aspiration prior to repeat MBSS. SLP Short Term Goal 3 (Week 1): Pt will demonstrate ability to problem solve during mildly complex functional tasks with Min A verbal/visual cues. SLP Short Term Goal 4 (Week 1): Pt will demonstrate executive functioning skills (organization, planning, reasoning) during functional tasks with Min A verbal/visual cues. SLP Short Term Goal 5 (Week 1): Pt will use speech intelligibility strategies to increase intelligibility to 90% at the conversation level with Min A cues.  Skilled Therapeutic Interventions:  Skilled treatment session targeted cognition, speech intelligibility and dysphagia goals. SLP facilitated session by providing Mod A verbal cues to increase vocal intensity at the phrase level to achieve ~ 75% intelligibility. Pt required Max A verbal cues to recall DOW. SLP printed this information for pt to review in effort to retain. SLP further facilitated session by providing skilled observation of pt consuming thin water via spoon. Pt with immediate cough prior to swallow on every bolus (5 boluses) which might possibly be related to premature spillage. Pt was significantly distracted by headache which may have contributed to performance during this session. Pt was left upright in bed, bed alarm on and all needs within reach.      Pain Pain Assessment Pain Scale: 0-10 Faces Pain Scale: Hurts whole  lot Pain Type: Acute pain Pain Location: Head Pain Orientation: Right;Lateral Pain Descriptors / Indicators: Headache Pain Frequency: Constant Pain Onset: On-going Pain Intervention(s): Medication (See eMAR)  Therapy/Group: Individual Therapy  Evart Mcdonnell 09/30/2019, 2:48 PM

## 2019-09-30 NOTE — Progress Notes (Signed)
[place] PHYSICAL MEDICINE & REHABILITATION PROGRESS NOTE   Subjective/Complaints: No new issues today. Headache is better. Talking on phone with girlfriend.   ROS: Limited due to cognitive/behavioral    Objective:   No results found. No results for input(s): WBC, HGB, HCT, PLT in the last 72 hours. No results for input(s): NA, K, CL, CO2, GLUCOSE, BUN, CREATININE, CALCIUM in the last 72 hours.  Intake/Output Summary (Last 24 hours) at 09/30/2019 1031 Last data filed at 09/30/2019 0846 Gross per 24 hour  Intake 300 ml  Output 425 ml  Net -125 ml     Physical Exam: Vital Signs Blood pressure 114/70, pulse 99, temperature 97.9 F (36.6 C), resp. rate 18, height 6' (1.829 m), weight 72.7 kg, SpO2 96 %. Constitutional: No distress . Vital signs reviewed. HEENT: EOMI, oral membranes moist Neck: supple Cardiovascular: RRR without murmur. No JVD    Respiratory/Chest: CTA Bilaterally without wheezes or rales. Normal effort    GI/Abdomen: BS +, non-tender, non-distended Ext: no clubbing, cyanosis, or edema Musculoskeletal:  General: No edema.Normal range of motion.  Cervical back: Normal range of motion.  Neurological: He isalertand oriented to person, place, and time. Left central 7, left HH/neglect. Still dysarthric. Fair insight and awareness. Language appears intact. LUE 0/5 LLE 0/5--no initiation. Senses pain on left. Moves RUE and RLE 4-5/5.  Skin: Skin iswarmand dry.No rashnoted. He is not diaphoretic. Noerythema.  Psychiatric: pleasant, calmer  Assessment/Plan: 1. Functional deficits secondary to right thalamic hemorrhage which require 3+ hours per day of interdisciplinary therapy in a comprehensive inpatient rehab setting.  Physiatrist is providing close team supervision and 24 hour management of active medical problems listed below.  Physiatrist and rehab team continue to assess barriers to discharge/monitor patient progress toward functional and  medical goals  Care Tool:  Bathing    Body parts bathed by patient: Chest, Abdomen, Front perineal area, Buttocks, Right upper leg, Left upper leg, Face   Body parts bathed by helper: Right arm, Left arm, Right lower leg, Left lower leg     Bathing assist Assist Level: Maximal Assistance - Patient 24 - 49%     Upper Body Dressing/Undressing Upper body dressing   What is the patient wearing?: Pull over shirt    Upper body assist Assist Level: Maximal Assistance - Patient 25 - 49%    Lower Body Dressing/Undressing Lower body dressing      What is the patient wearing?: Underwear/pull up, Pants     Lower body assist Assist for lower body dressing: Total Assistance - Patient < 25%     Toileting Toileting Toileting Activity did not occur Landscape architect and hygiene only): N/A (no void or bm)  Toileting assist Assist for toileting: Dependent - Patient 0%     Transfers Chair/bed transfer  Transfers assist     Chair/bed transfer assist level: Dependent - mechanical lift     Locomotion Ambulation   Ambulation assist   Ambulation activity did not occur: Safety/medical concerns(Increased R hip pain in standing, pt unable to tolerance standing)  Assist level: 2 helpers Assistive device: Lite Gait Max distance: 7 ft   Walk 10 feet activity   Assist  Walk 10 feet activity did not occur: Safety/medical concerns(Increased R hip pain in standing, pt unable to tolerance standing)        Walk 50 feet activity   Assist Walk 50 feet with 2 turns activity did not occur: Safety/medical concerns(Increased R hip pain in standing, pt unable to tolerance standing)  Walk 150 feet activity   Assist Walk 150 feet activity did not occur: Safety/medical concerns(Increased R hip pain in standing, pt unable to tolerance standing)         Walk 10 feet on uneven surface  activity   Assist Walk 10 feet on uneven surfaces activity did not occur:  Safety/medical concerns(Increased R hip pain in standing, pt unable to tolerance standing)         Wheelchair     Assist   Type of Wheelchair: Manual    Wheelchair assist level: Maximal Assistance - Patient 25 - 49% Max wheelchair distance: 50 ft    Wheelchair 50 feet with 2 turns activity    Assist    Wheelchair 50 feet with 2 turns activity did not occur: Safety/medical concerns(unable without skilled intervention)   Assist Level: Maximal Assistance - Patient 25 - 49%   Wheelchair 150 feet activity     Assist  Wheelchair 150 feet activity did not occur: Safety/medical concerns(unable without skilled intervention)       Blood pressure 114/70, pulse 99, temperature 97.9 F (36.6 C), resp. rate 18, height 6' (1.829 m), weight 72.7 kg, SpO2 96 %.  Medical Problem List and Plan: 1.Left hemiparesis and visual-spatial deficitssecondary to right thalamic hemorrhage with intraventricular extension -patient may shower -ELOS: 3/26 -Continue PT, OT, SLP -[facility] for LUE and LLE   2. Antithrombotics: -DVT/anticoagulation:Mechanical:Sequential compression devices, below kneeBilateral lower extremities--dopplers ordered -antiplatelet therapy: N/a 3.Headaches/Pain Management:Fioricet or ultram prnhaven't helped a great deal.  2/28 -increase topamax to 25mg  bid  3/1: Topamax increased to 50mg  bid  3/2: no increase in topamax today as his dose was just increased for the second consecutive day.    -encouraged relaxation, use of PRN's in meantime   3/4-5 headaches seem to be improving.    -continue current regimen 4. Mood:LCSW to follow for evaluation and support. -antipsychotic agents: N/A 5. Neuropsych: This patientiscapable of making decisions on hisown behalf. 6. Skin/Wound Care:Routine pressure relief measures. 7. Fluids/Electrolytes/Nutrition:Monitor I/O.  Offer nectar liquids between meals to maintain adequate hydration.  -intake good, needs supervision 8. HTN; Monitor BP tid-continue Hydralazine, Cozaar, Coreg, Catapres and Norvasc. TItrateas indicated--SBP goal<140.  3/5: well controlled.  9. Prediabetes: Hgb A1C- 6.2--was 5.7 eight months ago. Will have RD educate on CM diet--- monitor BS ac/hs with SSI for now.   2/28 fair control at present  3/1-5: well controlled---can dc cbg checks 10. Tobacco/Cannabis use: Reports that he plans on quitting.  11. Dyslipidemia: Statin on hold due to ICH--resume at discharge. 12. Dysphagia: doesn't like consistency but is tolerating D2/nectars so far with supervision  LOS: 6 days A FACE TO FACE EVALUATION WAS PERFORMED  Meredith Staggers 09/30/2019, 10:31 AM
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
## 2019-10-01 ENCOUNTER — Inpatient Hospital Stay (HOSPITAL_COMMUNITY): Payer: Self-pay | Admitting: Occupational Therapy

## 2019-10-01 LAB — GLUCOSE, CAPILLARY
Glucose-Capillary: 106 mg/dL — ABNORMAL HIGH (ref 70–99)
Glucose-Capillary: 106 mg/dL — ABNORMAL HIGH (ref 70–99)
Glucose-Capillary: 109 mg/dL — ABNORMAL HIGH (ref 70–99)
Glucose-Capillary: 105 mg/dL — ABNORMAL HIGH (ref 70–99)

## 2019-10-01 MED ORDER — AMITRIPTYLINE HCL 25 MG PO TABS
25.0000 mg | ORAL_TABLET | Freq: Every day | ORAL | Status: DC
  Administered 2019-10-01: 25 mg via ORAL
  Filled 2019-10-01: qty 1

## 2019-10-01 NOTE — Progress Notes (Signed)
Occupational Therapy Session Note  Patient Details  Name: Nathaneil Borchers MRN: [id number] Date of Birth: 05-May-1966  Today's Date: 10/01/2019 OT Individual Time: 1017-1100 OT Individual Time Calculation (min): 43 min   Short Term Goals: Week 1:  OT Short Term Goal 1 (Week 1): Pt will don overhead shirt with Mod A OT Short Term Goal 2 (Week 1): Pt will complete BSC or toilet transfer with 1 assist OT Short Term Goal 3 (Week 1): Pt will complete shower transfer with 1 assist  Skilled Therapeutic Interventions/Progress Updates:    Pt greeted supine in bed and agreeable to OT treatment session. Pt reported he finally did not have a headache today. Pt came to sitting EOB with max A. Stand-pivot to wc on R side with mod A. Pt agreeable to was bottom but did not want to change clothes today. Pt brought to the sink in wc and worked on sit<>stands with min/mod A, but then needed mod/max A to maintain standing balance when OT had him try to wash his bottom. OT applied new brief in standing and pt needed OT assist to pull pants back above hips. Pt brought to the gym and worked on [place] wit weight bearing towel pushes. Pt with slight pectoral activation noted.  Pt returned to room in wc with L UE supported on lap tray, call bell in reach, and alarm belt on. OT placed SAEBO e-stim on wrist extensors. SAEBO left on for 60 minutes. OT returned to remove SAEBO with skin intact and no adverse reactions.  [place] Stim One 330 pulse width 35 Hz pulse rate On 8 sec/ off 8 sec Ramp up/ down 2 sec Symmetrical Biphasic wave form  Max intensity 15mA at 500 Ohm load  Therapy Documentation Precautions:  Precautions Precautions: Fall Precaution Comments: left sided weakness and left lateral lean, SBP <160 Restrictions Weight Bearing Restrictions: No Other Position/Activity Restrictions: L PRAFO and WHO donned at rest Pain: Denies pain  Therapy/Group: Individual Therapy  Valma Cava 10/01/2019, 11:03 AM

## 2019-10-01 NOTE — Progress Notes (Signed)
[place] PHYSICAL MEDICINE & REHABILITATION PROGRESS NOTE   Subjective/Complaints: Continues to have headache. Sleeping poorly at night.  The Fioricet helps but the Topiramate does not help much. Eating pudding, magic cup, and applesauce as does not want any other lunch.   ROS: Limited due to cognitive/behavioral    Objective:   No results found. No results for input(s): WBC, HGB, HCT, PLT in the last 72 hours. No results for input(s): NA, K, CL, CO2, GLUCOSE, BUN, CREATININE, CALCIUM in the last 72 hours.  Intake/Output Summary (Last 24 hours) at 10/01/2019 1532 Last data filed at 10/01/2019 1303 Gross per 24 hour  Intake 280 ml  Output 725 ml  Net -445 ml     Physical Exam: Vital Signs Blood pressure 109/66, pulse (!) 54, temperature 98.2 F (36.8 C), temperature source Oral, resp. rate 16, height 6' (1.829 m), weight 72.7 kg, SpO2 99 %. Constitutional: No distress . Vital signs reviewed. Sitting up in bed eating pudding, applesauce, and Magic Cup HEENT: EOMI, oral membranes moist Neck: supple Cardiovascular: RRR without murmur. No JVD    Respiratory/Chest: CTA Bilaterally without wheezes or rales. Normal effort    GI/Abdomen: BS +, non-tender, non-distended Ext: no clubbing, cyanosis, or edema Musculoskeletal:  General: No edema.Normal range of motion.  Cervical back: Normal range of motion.  Neurological: He isalertand oriented to person, place, and time. Left central 7, left HH/neglect. Still dysarthric. Fair insight and awareness. Language appears intact. LUE 0/5 LLE 0/5--no initiation. Senses pain on left. Moves RUE and RLE 4-5/5.  Skin: Skin iswarmand dry.No rashnoted. He is not diaphoretic. Noerythema.  Psychiatric: pleasant, calmer  Assessment/Plan: 1. Functional deficits secondary to right thalamic hemorrhage which require 3+ hours per day of interdisciplinary therapy in a comprehensive inpatient rehab setting.  Physiatrist is providing close  team supervision and 24 hour management of active medical problems listed below.  Physiatrist and rehab team continue to assess barriers to discharge/monitor patient progress toward functional and medical goals  Care Tool:  Bathing    Body parts bathed by patient: Chest, Abdomen, Front perineal area, Buttocks, Right upper leg, Left upper leg, Face   Body parts bathed by helper: Right arm, Left arm, Right lower leg, Left lower leg     Bathing assist Assist Level: Maximal Assistance - Patient 24 - 49%     Upper Body Dressing/Undressing Upper body dressing   What is the patient wearing?: Pull over shirt    Upper body assist Assist Level: Maximal Assistance - Patient 25 - 49%    Lower Body Dressing/Undressing Lower body dressing      What is the patient wearing?: Underwear/pull up, Pants     Lower body assist Assist for lower body dressing: Total Assistance - Patient < 25%     Toileting Toileting Toileting Activity did not occur Landscape architect and hygiene only): N/A (no void or bm)  Toileting assist Assist for toileting: Dependent - Patient 0%     Transfers Chair/bed transfer  Transfers assist     Chair/bed transfer assist level: Dependent - mechanical lift     Locomotion Ambulation   Ambulation assist   Ambulation activity did not occur: Safety/medical concerns(Increased R hip pain in standing, pt unable to tolerance standing)  Assist level: 2 helpers Assistive device: Lite Gait Max distance: 7 ft   Walk 10 feet activity   Assist  Walk 10 feet activity did not occur: Safety/medical concerns(Increased R hip pain in standing, pt unable to tolerance standing)  Walk 50 feet activity   Assist Walk 50 feet with 2 turns activity did not occur: Safety/medical concerns(Increased R hip pain in standing, pt unable to tolerance standing)         Walk 150 feet activity   Assist Walk 150 feet activity did not occur: Safety/medical  concerns(Increased R hip pain in standing, pt unable to tolerance standing)         Walk 10 feet on uneven surface  activity   Assist Walk 10 feet on uneven surfaces activity did not occur: Safety/medical concerns(Increased R hip pain in standing, pt unable to tolerance standing)         Wheelchair     Assist   Type of Wheelchair: Manual    Wheelchair assist level: Maximal Assistance - Patient 25 - 49% Max wheelchair distance: 50 ft    Wheelchair 50 feet with 2 turns activity    Assist    Wheelchair 50 feet with 2 turns activity did not occur: Safety/medical concerns(unable without skilled intervention)   Assist Level: Maximal Assistance - Patient 25 - 49%   Wheelchair 150 feet activity     Assist  Wheelchair 150 feet activity did not occur: Safety/medical concerns(unable without skilled intervention)       Blood pressure 109/66, pulse (!) 54, temperature 98.2 F (36.8 C), temperature source Oral, resp. rate 16, height 6' (1.829 m), weight 72.7 kg, SpO2 99 %.  Medical Problem List and Plan: 1.Left hemiparesis and visual-spatial deficitssecondary to right thalamic hemorrhage with intraventricular extension -patient may shower -ELOS: 3/26 -Continue PT, OT, SLP -[facility] for LUE and LLE 2. Antithrombotics: -DVT/anticoagulation:Mechanical:Sequential compression devices, below kneeBilateral lower extremities--dopplers ordered -antiplatelet therapy: N/a 3.Headaches/Pain Management:Fioricet or ultram pr[place]aven't helped a great deal.  2/28 -increase topamax to 25mg  bid  3/1: Topamax increased to 50mg  bid  3/2: no increase in topamax today as his dose was just increased for the second consecutive day.    -encouraged relaxation, use of PRN's in meantime   3/4-5 headaches seem to be improving.    -continue current regimen  3/6: Continues to have headache that prevents him  from sleeping well. Added amitriptyline 25mg  HS for both headache and insomnia.  4. Mood:LCSW to follow for evaluation and support. -antipsychotic agents: N/A 5. Neuropsych: This patientiscapable of making decisions on hisown behalf. 6. Skin/Wound Care:Routine pressure relief measures. 7. Fluids/Electrolytes/Nutrition:Monitor I/O. Offer nectar liquids between meals to maintain adequate hydration.  -intake good, needs supervision 8. HTN; Monitor BP tid-continue Hydralazine, Cozaar, Coreg, Catapres and Norvasc. TItrateas indicated--SBP goal<140.  3/5, 3/6: well controlled.  9. Prediabetes: Hgb A1C- 6.2--was 5.7 eight months ago. Will have RD educate on CM diet--- monitor BS ac/hs with SSI for now.   2/28 fair control at present  3/1-6: well controlled---can dc cbg checks 10. Tobacco/Cannabis use: Reports that he plans on quitting.  11. Dyslipidemia: Statin on hold due to ICH--resume at discharge. 12. Dysphagia: doesn't like consistency but is tolerating D2/nectars so far with supervision  LOS: 7 days A FACE TO FACE EVALUATION WAS [place] Karine Garn 10/01/2019, 3:32 PM

## 2019-10-02 ENCOUNTER — Inpatient Hospital Stay (HOSPITAL_COMMUNITY): Payer: Self-pay | Admitting: Physical Therapy

## 2019-10-02 ENCOUNTER — Inpatient Hospital Stay (HOSPITAL_COMMUNITY): Payer: Self-pay | Admitting: Speech Pathology

## 2019-10-02 MED ORDER — AMITRIPTYLINE HCL 25 MG PO TABS
50.0000 mg | ORAL_TABLET | Freq: Every day | ORAL | Status: DC
  Administered 2019-10-02 – 2019-10-06 (×5): 50 mg via ORAL
  Filled 2019-10-02 (×5): qty 2

## 2019-10-02 NOTE — Progress Notes (Signed)
Speech Language Pathology Daily Session Note  Patient Details  Name: Timofey Essary MRN: [id number] Date of Birth: 04/12/1966  Today's Date: 10/02/2019 SLP Individual Time: 1018-1100 SLP Individual Time Calculation (min): 42 min  Short Term Goals: Week 1: SLP Short Term Goal 1 (Week 1): Pt will consume current diet with minmal overt s/sx aspiration and demonstrate efficient mastication and oral clearance with Supervision A verbal cues for use of swallow strategies. SLP Short Term Goal 2 (Week 1): Pt will consume ice/thin H2O trials X3 with minimal overt s/sx aspiration prior to repeat MBSS. SLP Short Term Goal 3 (Week 1): Pt will demonstrate ability to problem solve during mildly complex functional tasks with Min A verbal/visual cues. SLP Short Term Goal 4 (Week 1): Pt will demonstrate executive functioning skills (organization, planning, reasoning) during functional tasks with Min A verbal/visual cues. SLP Short Term Goal 5 (Week 1): Pt will use speech intelligibility strategies to increase intelligibility to 90% at the conversation level with Min A cues.  Skilled Therapeutic Interventions:  Pt was seen for skilled ST targeting goals for dysphagia.  Therapist facilitated the session with trials of thin liquids to continue working towards diet progression. Pt noted with bleeding gums during oral care prior to trials and required multiple liquid rinses to clear mouth.  Pt had no overt s/s of aspiration with teaspoons of thin liquids so SLP increased trials to include small sips of water.  Pt had one immediate, reflexive cough on initial cup sip of thins but no additional s/s of aspiration were evident over 4 consecutive trials. During session, pt asked therapist "Am I completely stupid?" and verbalized difficulty coping with loss of independence and being separated from his girlfriend.   Pt also indicated that he had limited understanding of strokes and stroke recovery. As a result, therapist provided  emotional support and encouragement in addition to skilled education regarding rationale for CIR level therapies during acute period of spontaneous recovery, sequelae of stroke recovery, and realistic prognostic expectations for recovery.  All questions were answered to pt's satisfaction at this time.   Pt was left in bed with bed alarm set and call bell within reach.  Continue per current plan of care.    Pain Pain Assessment Pain Scale: 0-10 Pain Score: 7  Pain Type: Acute pain Pain Location: Head Pain Descriptors / Indicators: Headache Pain Frequency: Constant Pain Intervention(s): Other (Comment)(pt politely declined meds)  Therapy/Group: Individual Therapy  Letanya Froh, Selinda Orion 10/02/2019, 11:02 AM

## 2019-10-02 NOTE — Progress Notes (Signed)
[place] PHYSICAL MEDICINE & REHABILITATION PROGRESS NOTE   Subjective/Complaints: On commode. Had mod-sized BM.  Continued to complain of headache in therapy session today. Propelled WC 150 feet with MinA Transferred to EOB with MaxA  ROS: Limited due to cognitive/behavioral    Objective:   No results found. No results for input(s): WBC, HGB, HCT, PLT in the last 72 hours. No results for input(s): NA, K, CL, CO2, GLUCOSE, BUN, CREATININE, CALCIUM in the last 72 hours.  Intake/Output Summary (Last 24 hours) at 10/02/2019 1219 Last data filed at 10/02/2019 0900 Gross per 24 hour  Intake 460 ml  Output 200 ml  Net 260 ml     Physical Exam: Vital Signs Blood pressure 132/84, pulse (!) 58, temperature 98.2 F (36.8 C), resp. rate 18, height 6' (1.829 m), weight 72.7 kg, SpO2 98 %. Constitutional: No distress . Vital signs reviewed. Having BM on commode.  HEENT: EOMI, oral membranes moist Neck: supple Cardiovascular: RRR without murmur. No JVD    Respiratory/Chest: CTA Bilaterally without wheezes or rales. Normal effort    GI/Abdomen: BS +, non-tender, non-distended Ext: no clubbing, cyanosis, or edema Musculoskeletal:  General: No edema.Normal range of motion.  Cervical back: Normal range of motion.  Neurological: He isalertand oriented to person, place, and time. Left central 7, left HH/neglect. Still dysarthric. Fair insight and awareness. Language appears intact. LUE 0/5 LLE 0/5--no initiation. Senses pain on left. Moves RUE and RLE 4-5/5.  Skin: Skin iswarmand dry.No rashnoted. He is not diaphoretic. Noerythema.  Psychiatric: pleasant, calmer  Assessment/Plan: 1. Functional deficits secondary to right thalamic hemorrhage which require 3+ hours per day of interdisciplinary therapy in a comprehensive inpatient rehab setting.  Physiatrist is providing close team supervision and 24 hour management of active medical problems listed below.  Physiatrist and  rehab team continue to assess barriers to discharge/monitor patient progress toward functional and medical goals  Care Tool:  Bathing    Body parts bathed by patient: Chest, Abdomen, Front perineal area, Buttocks, Right upper leg, Left upper leg, Face   Body parts bathed by helper: Right arm, Left arm, Right lower leg, Left lower leg     Bathing assist Assist Level: Maximal Assistance - Patient 24 - 49%     Upper Body Dressing/Undressing Upper body dressing   What is the patient wearing?: Pull over shirt    Upper body assist Assist Level: Maximal Assistance - Patient 25 - 49%    Lower Body Dressing/Undressing Lower body dressing      What is the patient wearing?: Underwear/pull up, Pants     Lower body assist Assist for lower body dressing: Total Assistance - Patient < 25%     Toileting Toileting Toileting Activity did not occur Landscape architect and hygiene only): N/A (no void or bm)  Toileting assist Assist for toileting: Dependent - Patient 0%     Transfers Chair/bed transfer  Transfers assist     Chair/bed transfer assist level: Maximal Assistance - Patient 25 - 49%     Locomotion Ambulation   Ambulation assist   Ambulation activity did not occur: Safety/medical concerns(Increased R hip pain in standing, pt unable to tolerance standing)  Assist level: 2 helpers Assistive device: Lite Gait Max distance: 7 ft   Walk 10 feet activity   Assist  Walk 10 feet activity did not occur: Safety/medical concerns(Increased R hip pain in standing, pt unable to tolerance standing)        Walk 50 feet activity   Assist Walk  50 feet with 2 turns activity did not occur: Safety/medical concerns(Increased R hip pain in standing, pt unable to tolerance standing)         Walk 150 feet activity   Assist Walk 150 feet activity did not occur: Safety/medical concerns(Increased R hip pain in standing, pt unable to tolerance standing)         Walk 10  feet on uneven surface  activity   Assist Walk 10 feet on uneven surfaces activity did not occur: Safety/medical concerns(Increased R hip pain in standing, pt unable to tolerance standing)         Wheelchair     Assist   Type of Wheelchair: Manual    Wheelchair assist level: Minimal Assistance - Patient > 75% Max wheelchair distance: 150    Wheelchair 50 feet with 2 turns activity    Assist    Wheelchair 50 feet with 2 turns activity did not occur: Safety/medical concerns(unable without skilled intervention)   Assist Level: Minimal Assistance - Patient > 75%   Wheelchair 150 feet activity     Assist  Wheelchair 150 feet activity did not occur: Safety/medical concerns(unable without skilled intervention)   Assist Level: Minimal Assistance - Patient > 75%   Blood pressure 132/84, pulse (!) 58, temperature 98.2 F (36.8 C), resp. rate 18, height 6' (1.829 m), weight 72.7 kg, SpO2 98 %.  Medical Problem List and Plan: 1.Left hemiparesis and visual-spatial deficitssecondary to right thalamic hemorrhage with intraventricular extension -patient may shower -ELOS: 3/26 -Continue PT, OT, SLP -[facility] for LUE and LLE 2. Antithrombotics: -DVT/anticoagulation:Mechanical:Sequential compression devices, below kneeBilateral lower extremities--dopplers ordered -antiplatelet therapy: N/a 3.Headaches/Pain Management:Fioricet or ultram prnhaven't helped a great deal.  2/28 -increase topamax to 25mg  bid  3/1: Topamax increased to 50mg  bid  3/2: no increase in topamax today as his dose was just increased for the second consecutive day.    -encouraged relaxation, use of PRN's in meantime   3/4-5 headaches seem to be improving.    -continue current regimen  3/6: Continues to have headache that prevents him from sleeping well. Added amitriptyline 25mg  HS for both headache and insomnia.    3/7: Tolerated increase well and still with headaches, will increase further to 50mg  HS 4. Mood:LCSW to follow for evaluation and support. -antipsychotic agents: N/A 5. Neuropsych: This patientiscapable of making decisions on hisown behalf. 6. Skin/Wound Care:Routine pressure relief measures. 7. Fluids/Electrolytes/Nutrition:Monitor I/O. Offer nectar liquids between meals to maintain adequate hydration.  -intake good, needs supervision 8. HTN; Monitor BP tid-continue Hydralazine, Cozaar, Coreg, Catapres and Norvasc. TItrateas indicated--SBP goal<140.  3/5, 3/6, 3/7: well controlled.  9. Prediabetes: Hgb A1C- 6.2--was 5.7 eight months ago. Will have RD educate on CM diet--- monitor BS ac/hs with SSI for now.   2/28 fair control at present  3/1-6: well controlled---can dc cbg checks 10. Tobacco/Cannabis use: Reports that he plans on quitting.  11. Dyslipidemia: Statin on hold due to ICH--resume at discharge. 12. Dysphagia: doesn't like consistency but is tolerating D2/nectars so far with supervision  LOS: 8 days A FACE TO FACE EVALUATION WAS [place] 10/02/2019, 12:19 PM

## 2019-10-02 NOTE — Progress Notes (Signed)
Physical Therapy Session Note  Patient Details  Name: Paul [name] MRN: [id number] Date of Birth: 29-Nov-1965  Today's Date: 10/02/2019 PT Individual Time: 0801-0900 PT Individual Time Calculation (min): 59 min   Short Term Goals: Week 1:  PT Short Term Goal 1 (Week 1): Patient will perform basic transfers with min A consistently using LRAD. PT Short Term Goal 1 - Progress (Week 1): Not met PT Short Term Goal 2 (Week 1): Patient will initiate gait training. PT Short Term Goal 2 - Progress (Week 1): Met(with lite gait) PT Short Term Goal 3 (Week 1): Patient will tolerate standing activities >2 min. PT Short Term Goal 3 - Progress (Week 1): Met  Skilled Therapeutic Interventions/Progress Updates:  Pt was seen bedside in the am. Pt transferred supine to edge of bed with side rail, head of bed elevated and mod A with verbal cues. Pt tolerated edge of bed with S and R UE support. Pt transferred edge of bed to w/c with max A and verbal cues. Pt propelled w/c about 150 feet with R UE and LE with min A and verbal cues. In gym treatment focused on standing with hemicane and mod A with verbal cues. Pt transferred w/c to chair with hemiwalker and max A with verbal cues. Pt transferred chair to w/c with mod to max A stand pivot to R. Pt returned to room and left sitting up in w/c with seat belt alarm on and call bell within reach.   Therapy Documentation Precautions:  Precautions Precautions: Fall Precaution Comments: left sided weakness and left lateral lean, SBP <160 Restrictions Weight Bearing Restrictions: No Other Position/Activity Restrictions: L PRAFO and WHO donned at rest General:   Pain: Pt c/o headache.  Therapy/Group: Individual Therapy  Dub Amis 10/02/2019, 12:06 PM

## 2019-10-03 ENCOUNTER — Inpatient Hospital Stay (HOSPITAL_COMMUNITY): Payer: Self-pay | Admitting: Physical Therapy

## 2019-10-03 ENCOUNTER — Inpatient Hospital Stay (HOSPITAL_COMMUNITY): Payer: Self-pay | Admitting: Occupational Therapy

## 2019-10-03 ENCOUNTER — Inpatient Hospital Stay (HOSPITAL_COMMUNITY): Payer: Self-pay | Admitting: Speech Pathology

## 2019-10-03 DIAGNOSIS — M545 Low back pain, unspecified: Secondary | ICD-10-CM

## 2019-10-03 DIAGNOSIS — I69391 Dysphagia following cerebral infarction: Secondary | ICD-10-CM

## 2019-10-03 DIAGNOSIS — R7303 Prediabetes: Secondary | ICD-10-CM

## 2019-10-03 DIAGNOSIS — N179 Acute kidney failure, unspecified: Secondary | ICD-10-CM

## 2019-10-03 DIAGNOSIS — G441 Vascular headache, not elsewhere classified: Secondary | ICD-10-CM

## 2019-10-03 LAB — CBC
HCT: 41.7 % (ref 39.0–52.0)
Hemoglobin: 13.7 g/dL (ref 13.0–17.0)
MCH: 30.3 pg (ref 26.0–34.0)
MCHC: 32.9 g/dL (ref 30.0–36.0)
MCV: 92.3 fL (ref 80.0–100.0)
Platelets: 489 10*3/uL — ABNORMAL HIGH (ref 150–400)
RBC: 4.52 MIL/uL (ref 4.22–5.81)
RDW: 13.2 % (ref 11.5–15.5)
WBC: 7.2 10*3/uL (ref 4.0–10.5)
nRBC: 0 % (ref 0.0–0.2)

## 2019-10-03 LAB — BASIC METABOLIC PANEL
Anion gap: 9 (ref 5–15)
BUN: 29 mg/dL — ABNORMAL HIGH (ref 6–20)
CO2: 25 mmol/L (ref 22–32)
Calcium: 10 mg/dL (ref 8.9–10.3)
Chloride: 105 mmol/L (ref 98–111)
Creatinine, Ser: 1.35 mg/dL — ABNORMAL HIGH (ref 0.61–1.24)
GFR calc Af Amer: >60 mL/min (ref >60)
GFR calc non Af Amer: >60 mL/min (ref >60)
Glucose, Bld: 119 mg/dL — ABNORMAL HIGH (ref 70–99)
Potassium: 4.1 mmol/L (ref 3.5–5.1)
Sodium: 139 mmol/L (ref 135–145)

## 2019-10-03 MED ORDER — SODIUM CHLORIDE 0.9 % IV SOLN: INTRAVENOUS | Status: AC

## 2019-10-03 NOTE — Progress Notes (Signed)
Physical Therapy Session Note  Patient Details  Name: Paul [name] MRN: [id number] Date of Birth: 24-Mar-1966  Today's Date: 10/03/2019 PT Individual Time: 0802-0830 PT Individual Time Calculation (min): 28 min   Short Term Goals: Week 2:  PT Short Term Goal 1 (Week 2): Pt will propel w/c 75 ft with mod assist. PT Short Term Goal 2 (Week 2): Pt will consistently complete transfers to L with mod assist. PT Short Term Goal 3 (Week 2): Pt will demonstrate standing balance with mod assist x 3 minutes.  Skilled Therapeutic Interventions/Progress Updates:     Patient received in bed, pleasant and willing to work with therapy this morning. Continues to require ModA for bed mobility with use of rail and HOB elevated, then Min-ModA to maintain midline sitting at EOB. Worked on sitting balance with tactile feedback on core musculature to improve static midline sitting but continues to require MinA and cues for midline due to poor perception of self to orientation in space. MaxA of 1 with standby of second person for transfer to the left side today, needed cues prior to transfer for correct sequencing. Transferred to gym [place] in [facility] for time management, then worked on static standing balance with cues for increased WB/weight shift to R, upright posture, and reduction of L lateral lean. Easily frustrated by this activity. He was left up in the [facility] with PT tech applying seatbelt alarm/attending to patient's needs at EOS.   Therapy Documentation Precautions:  Precautions Precautions: Fall Precaution Comments: left sided weakness and left lateral lean, SBP <160 Restrictions Weight Bearing Restrictions: No Other Position/Activity Restrictions: L PRAFO and WHO donned at rest   Pain: Pain Assessment Pain Scale: 0-10 Pain Score: 0-No pain Faces Pain Scale: No hurt Multiple Pain Sites: No    Therapy/Group: Individual Therapy   Windell Norfolk, DPT, PN1   Supplemental Physical Therapist [place]     Pager [phone] Acute Rehab Office [phone]    10/03/2019, 12:07 PM

## 2019-10-03 NOTE — Progress Notes (Signed)
Physical Therapy Session Note  Patient Details  Name: Paul [name] MRN: [id number] Date of Birth: 16-May-1966  Today's Date: 10/03/2019 PT Individual Time: 1103-1200 PT Individual Time Calculation (min): 57 min   Short Term Goals: Week 2:  PT Short Term Goal 1 (Week 2): Pt will propel w/c 75 ft with mod assist. PT Short Term Goal 2 (Week 2): Pt will consistently complete transfers to L with mod assist. PT Short Term Goal 3 (Week 2): Pt will demonstrate standing balance with mod assist x 3 minutes.  Skilled Therapeutic Interventions/Progress Updates: Pt presented in w/c agreeable to therapy. Pt c/o HA however nsg advised that pt received pain meds just prior to PTA arriving. Pt propelled w/c to rehab gym with minA for initiation and mod multimodal cues for negotiation of objects. Performed stand pivot to max to R modA with max cues for sequencing and PTA blocking L knee. Pt set up with [employer] and participated in standing with and without HW with PTA blocking L knee, and providing manual facilitation to L at hips to increase wt bearing while pt attempted to take forward/backward step with RLE. Pt required max multimodal cues with mirror feedback to increase erect posture and facilitate anterior translation of hips. Pt was able to perform reaching task without HW obtaining x 3 horseshoes from mirror with RUE. Pt also provided max multimodal cues when returning to sitting each bout to use R side to control descent as first time pt released HW and hung while sling was lowered to mat.Pt performed lateral scoots to R with CGA and performed squat pivot to R to return to w/c with minA. Pt transported back to room at end of session due to increased HA and remained in w/c with belt alarm placed, call bell within reach, and PTA mixing cup of ice water (to nectar consistency) and needs met.      Therapy Documentation Precautions:  Precautions Precautions: Fall Precaution Comments: left sided weakness and left  lateral lean, SBP <160 Restrictions Weight Bearing Restrictions: No Other Position/Activity Restrictions: L PRAFO and WHO donned at rest General:   Vital Signs: Therapy Vitals Temp: 98.2 F (36.8 C) Pulse Rate: 65 Resp: 17 BP: 109/69 Patient Position (if appropriate): Sitting Oxygen Therapy SpO2: 99 % O2 Device: Room Air    Therapy/Group: Individual Therapy  Talaya Lamprecht  Elice Crigger, PTA  10/03/2019, 4:34 PM

## 2019-10-03 NOTE — Progress Notes (Signed)
Occupational Therapy Session Note  Patient Details  Name: Paul [name] MRN: [id number] Date of Birth: 23-Jul-1966  Today's Date: 10/03/2019 OT Individual Time: 0900-1000 OT Individual Time Calculation (min): 60 min   Short Term Goals: Week 1:  OT Short Term Goal 1 (Week 1): Pt will don overhead shirt with Mod A OT Short Term Goal 2 (Week 1): Pt will complete BSC or toilet transfer with 1 assist OT Short Term Goal 3 (Week 1): Pt will complete shower transfer with 1 assist  Skilled Therapeutic Interventions/Progress Updates:    Pt greeted in the w/c, reporting 5/10 HA pain. He was premedicated, wanting to shower. Stedy transfer completed to padded tub bench with Min A sit<stand. Had pt support his Lt wrist on Stedy bar however he needed OT's assistance due to inattention. Pt then bathed while seated on tub bench, noted significant Lt leaning posture with pt sliding towards Lt or Rt of cutout when attempting to reposition for improved alignment. Recommend using 3:1 for showers in the future to increase postural support and independence with bathing tasks. HOH for incorporating Lt arm and also assist for managing affected arm when washing Lt side. OT assisted him obtain and maintain figure 4 on the Lt side so he could wash his lower leg. Pt also needed Max A to complete perihygiene due to trunk control deficits and often needing his Rt arm to hold onto bench to maintain upright position. Transitioned via Stedy to w/c, and then he completed dressing tasks sit<stand at the sink. Pt once again needing Max A for these tasks using hemi techniques. Min A for sit<stand but Max A for dynamic balance when assisting with elevating pants due to Lt sided collapse and forward LOB as well. Openly discussed with pt how he is coping emotionally with CVA recovery. Per pt, he is "pissed," expressing frustration regarding his limited functional capabilities, "angry" that staff has to help him so much at this time. Discussed with  pt at length that CVA recovery takes a great deal of time, also educated pt that his afflictive emotions and negative thoughts interfere with his ability to participate in tx sessions at max level of participation. Strongly encouraged him to concentrate on therapeutic task vs afflictive emotions that arise before he engages in task. When he stood a 2nd time for LB dressing tasks, he still needed Max A for balance but with improved control, more of Lt lean vs Lt collapse with affected side supported. Pt remained in w/c with all needs within reach, half lap tray, and safety belt fastened at end of session, NT notified of his request to drink some water.   Pt also drank his nectar thickened orange juice at start of session, noted 2 spells of coughing with 4 sips.    Therapy Documentation Precautions:  Precautions Precautions: Fall Precaution Comments: left sided weakness and left lateral lean, SBP <160 Restrictions Weight Bearing Restrictions: No Other Position/Activity Restrictions: L PRAFO and WHO donned at rest Vital Signs: Therapy Vitals Temp: 98.2 F (36.8 C) Pulse Rate: 65 Resp: 17 BP: 109/69 Patient Position (if appropriate): Sitting Oxygen Therapy SpO2: 99 % O2 Device: Room Air Pain: Pain Assessment Pain Scale: 0-10 Pain Score: 0-No pain Faces Pain Scale: No hurt Multiple Pain Sites: No ADL: ADL Eating: Not assessed Grooming: Moderate assistance Where Assessed-Grooming: Edge of bed Upper Body Bathing: Moderate assistance Where Assessed-Upper Body Bathing: Edge of bed Lower Body Bathing: Maximal assistance Where Assessed-Lower Body Bathing: Edge of bed Upper Body Dressing: Maximal  assistance Where Assessed-Upper Body Dressing: Edge of bed Lower Body Dressing: Maximal assistance Where Assessed-Lower Body Dressing: Edge of bed Toileting: Not assessed Toilet Transfer: Not assessed Tub/Shower Transfer: Not assessed      Therapy/Group: Individual Therapy  Eragon Hammond A  Ellisa Devivo 10/03/2019, 3:33 PM

## 2019-10-03 NOTE — Progress Notes (Signed)
Occupational Therapy Weekly Progress Note  Patient Details  Name: Paul [name] MRN: [id number] Date of Birth: 1966-01-10  Beginning of progress report period: September 25, 2019 End of progress report period: October 03, 2019  Patient has met 2 of 3 short term goals.  Pt is making slow, but steady progress towards OT goals at this time. Pt can be as little as min/mod A for transfers to the stronger R side, but needs up to max A for transfers to the weaker L side. L UE continues to be flaccid. Pt has demonstrated some improved L attention and improved sit<>stands within BADL tasks. Continue current POC.  Patient continues to demonstrate the following deficits: muscle weakness, decreased cardiorespiratoy endurance, impaired timing and sequencing, abnormal tone, unbalanced muscle activation, motor apraxia, ataxia, decreased coordination and decreased motor planning, decreased attention to left and decreased motor planning, decreased initiation, decreased attention, decreased awareness, decreased problem solving, decreased safety awareness, decreased memory and delayed processing and decreased sitting balance, decreased standing balance, decreased postural control, hemiplegia and decreased balance strategies and therefore will continue to benefit from skilled OT intervention to enhance overall performance with BADL and Reduce care partner burden.  Patient progressing toward long term goals..  Continue plan of care.  OT Short Term Goals Week 1:  OT Short Term Goal 1 (Week 1): Pt will don overhead shirt with Mod A OT Short Term Goal 1 - Progress (Week 1): Met OT Short Term Goal 2 (Week 1): Pt will complete BSC or toilet transfer with 1 assist OT Short Term Goal 2 - Progress (Week 1): Met OT Short Term Goal 3 (Week 1): Pt will complete shower transfer with 1 assist OT Short Term Goal 3 - Progress (Week 1): Progressing toward goal Week 2:  OT Short Term Goal 1 (Week 2): Pt will complete shower transfer with 1  assist OT Short Term Goal 2 (Week 2): Pt will maintain sitting balance at EOB with no more than Mod A for balance within BADL task OT Short Term Goal 3 (Week 2): Pt will maintain standing balance with mod A within BADL task    Therapy/Group: Individual Therapy  Valma Cava 10/03/2019, 8:59 PM

## 2019-10-03 NOTE — Progress Notes (Signed)
[place] PHYSICAL MEDICINE & REHABILITATION PROGRESS NOTE   Subjective/Complaints: Patient seen sitting up in bed this morning.  He states he did not stay well overnight due to headaches and back pain.  He notes headaches this a.m., and is about to receive pain medications.  He states he would like something to drink, discussed nectar consistency.  ROS: + Headaches, back pain.  Objective:   No results found. Recent Labs    10/03/19 0622  WBC 7.2  HGB 13.7  HCT 41.7  PLT 489*   Recent Labs    10/03/19 0622  NA 139  K 4.1  CL 105  CO2 25  GLUCOSE 119*  BUN 29*  CREATININE 1.35*  CALCIUM 10.0    Intake/Output Summary (Last 24 hours) at 10/03/2019 1132 Last data filed at 10/03/2019 0845 Gross per 24 hour  Intake 480 ml  Output 450 ml  Net 30 ml     Physical Exam: Vital Signs Blood pressure 119/73, pulse (!) 51, temperature 97.8 F (36.6 C), temperature source Oral, resp. rate 17, height 6' (1.829 m), weight 72.7 kg, SpO2 99 %. Constitutional: No distress . Vital signs reviewed. HENT: Normocephalic.  Atraumatic. Eyes: EOMI. No discharge. Cardiovascular: No JVD. Respiratory: Normal effort.  No stridor. GI: Non-distended. Skin: Warm and dry.  Intact. Psych: Normal mood.  Normal behavior. Musc: No edema in extremities.  No tenderness in extremities. Neurological: Alert Motor: LUE/LE: 0/5 proximal distal No increase in tone noted Left facial weakness  Assessment/Plan: 1. Functional deficits secondary to right thalamic hemorrhage which require 3+ hours per day of interdisciplinary therapy in a comprehensive inpatient rehab setting.  Physiatrist is providing close team supervision and 24 hour management of active medical problems listed below.  Physiatrist and rehab team continue to assess barriers to discharge/monitor patient progress toward functional and medical goals  Care Tool:  Bathing    Body parts bathed by patient: Chest, Abdomen, Front perineal area,  Right upper leg, Left upper leg, Face   Body parts bathed by helper: Buttocks, Right arm, Left arm, Right lower leg, Left lower leg     Bathing assist Assist Level: Maximal Assistance - Patient 24 - 49%     Upper Body Dressing/Undressing Upper body dressing   What is the patient wearing?: Pull over shirt    Upper body assist Assist Level: Maximal Assistance - Patient 25 - 49%    Lower Body Dressing/Undressing Lower body dressing      What is the patient wearing?: Underwear/pull up, Pants     Lower body assist Assist for lower body dressing: Maximal Assistance - Patient 25 - 49%     Toileting Toileting Toileting Activity did not occur (Clothing management and hygiene only): N/A (no void or bm)  Toileting assist Assist for toileting: Dependent - Patient 0%     Transfers Chair/bed transfer  Transfers assist     Chair/bed transfer assist level: Maximal Assistance - Patient 25 - 49%     Locomotion Ambulation   Ambulation assist   Ambulation activity did not occur: Safety/medical concerns(Increased R hip pain in standing, pt unable to tolerance standing)  Assist level: 2 helpers Assistive device: Lite Gait Max distance: 7 ft   Walk 10 feet activity   Assist  Walk 10 feet activity did not occur: Safety/medical concerns(Increased R hip pain in standing, pt unable to tolerance standing)        Walk 50 feet activity   Assist Walk 50 feet with 2 turns activity did not  occur: Safety/medical concerns(Increased R hip pain in standing, pt unable to tolerance standing)         Walk 150 feet activity   Assist Walk 150 feet activity did not occur: Safety/medical concerns(Increased R hip pain in standing, pt unable to tolerance standing)         Walk 10 feet on uneven surface  activity   Assist Walk 10 feet on uneven surfaces activity did not occur: Safety/medical concerns(Increased R hip pain in standing, pt unable to tolerance standing)          Wheelchair     Assist   Type of Wheelchair: Manual    Wheelchair assist level: Minimal Assistance - Patient > 75% Max wheelchair distance: 150    Wheelchair 50 feet with 2 turns activity    Assist    Wheelchair 50 feet with 2 turns activity did not occur: Safety/medical concerns(unable without skilled intervention)   Assist Level: Minimal Assistance - Patient > 75%   Wheelchair 150 feet activity     Assist  Wheelchair 150 feet activity did not occur: Safety/medical concerns(unable without skilled intervention)   Assist Level: Minimal Assistance - Patient > 75%   Blood pressure 119/73, pulse (!) 51, temperature 97.8 F (36.6 C), temperature source Oral, resp. rate 17, height 6' (1.829 m), weight 72.7 kg, SpO2 99 %.  Medical Problem List and Plan: 1.Left hemiparesis and visual-spatial deficitssecondary to right thalamic hemorrhage with intraventricular extension  Continue CIR  -WHO,PRAFO for LUE and LLE 2. Antithrombotics:  -DVT/anticoagulation:Mechanical:Sequential compression devices, below kneeBilateral lower extremities   Dopplers negative for DVT -antiplatelet therapy: N/a 3.Headaches/Pain Management:Fioricet or ultram prnhaven't helped a great deal.  Topamax increased to 50mg  bid on 3/1  Amitriptyline increased to 50qHS for both headache and insomnia on 3/7.   Would not make any further changes today given recent adjustments, however may need to increase medications further  K pad ordered for low back pain 4. Mood:LCSW to follow for evaluation and support. -antipsychotic agents: N/A 5. Neuropsych: This patientiscapable of making decisions on hisown behalf. 6. Skin/Wound Care:Routine pressure relief measures. 7. Fluids/Electrolytes/Nutrition:Monitor I/Os. Offer nectar liquids between meals to maintain adequate hydration. 8. HTN: Monitor BP tid-continue Hydralazine, Cozaar, Coreg, Catapres and  Norvasc. TItrateas indicated--SBP goal<140.  Controlled on 3/8 9. Prediabetes: Hgb A1C- 6.2--was 5.7 eight months ago. Will have RD educate on CM diet--- monitor BS ac/hs with SSI for now.   Mildly elevated on 3/8 10. Tobacco/Cannabis use: Reports that he plans on quitting.  11. Dyslipidemia: Statin on hold due to ICH--resume at discharge. 12.  Post stroke dysphagia: doesn't like consistency but is tolerating due to nectars so far with supervision 13.  AKI  Creatinine 1.35 on 3/8  Encourage fluids  IVF to adjust x3 night started on 3/8, echo reviewed with EF of 55-60%  See #12  LOS: 9 days A FACE TO FACE EVALUATION WAS PERFORMED  Ankit Lorie Phenix 10/03/2019, 11:32 AM

## 2019-10-03 NOTE — Progress Notes (Signed)
Speech Language Pathology Weekly Progress and Session Note  Patient Details  Name: Paul [name] MRN: [id number] Date of Birth: 1966/06/21  Beginning of progress report period: September 25, 2019 End of progress report period: October 03, 2019  Today's Date: 10/03/2019 SLP Individual Time: 1300-1355 SLP Individual Time Calculation (min): 55 min  Short Term Goals: Week 1: SLP Short Term Goal 1 (Week 1): Pt will consume current diet with minmal overt s/sx aspiration and demonstrate efficient mastication and oral clearance with Supervision A verbal cues for use of swallow strategies. SLP Short Term Goal 1 - Progress (Week 1): Not met SLP Short Term Goal 2 (Week 1): Pt will consume ice/thin H2O trials X3 with minimal overt s/sx aspiration prior to repeat MBSS. SLP Short Term Goal 2 - Progress (Week 1): Not met SLP Short Term Goal 3 (Week 1): Pt will demonstrate ability to problem solve during mildly complex functional tasks with Min A verbal/visual cues. SLP Short Term Goal 3 - Progress (Week 1): Not met SLP Short Term Goal 4 (Week 1): Pt will demonstrate executive functioning skills (organization, planning, reasoning) during functional tasks with Min A verbal/visual cues. SLP Short Term Goal 4 - Progress (Week 1): Not met SLP Short Term Goal 5 (Week 1): Pt will use speech intelligibility strategies to increase intelligibility to 90% at the conversation level with Min A cues. SLP Short Term Goal 5 - Progress (Week 1): Met    New Short Term Goals: Week 2: SLP Short Term Goal 1 (Week 2): Pt will consume current diet with minmal overt s/sx aspiration and demonstrate efficient mastication and oral clearance with Supervision A verbal cues for use of swallow strategies. SLP Short Term Goal 2 (Week 2): Pt will consume trials of thin liquids over 3 sessions with minimal overt s/sx aspiration with Min verbal cues to assess readiness for repeat MBSS. SLP Short Term Goal 3 (Week 2): Pt will demonstrate ability  to problem solve functional, familiar and basic tasks with Min A verbal/visual cues. SLP Short Term Goal 4 (Week 2): Patient will demonstrate sustained attention to functional tasks for ~20 minutes with Min verbal cues for redirection. SLP Short Term Goal 5 (Week 2): Patient will attend to left field of enviornment during functional tasks with Min verbal cues. SLP Short Term Goal 6 (Week 2): Pt will use speech intelligibility strategies to increase intelligibility to 90% at the sentence level with supervision cues.  Weekly Progress Updates: Patient has made minimal gains and has met 1 of 5 STGs this reporting period. Currently, patient is consuming Dys. 2 textures with nectar-thick liquids with intermittent overt s/s of aspiration and Min-Mod A verbal cues for use of swallowing compensatory strategies. Patient also continues to demonstrate consistent overt s/s of aspiration with trials of thin liquids, therefore, patient is not ready for a repeat MBS at this time. Patient demonstrates improved speech intelligibility at the phrase and sentence level and requires overall Min A verbal cues for use of an increased vocal intensity and over-articulation. Patient also requires overall Mod A multimodal cues to complete functional and familiar tasks safely in regards to problem solving, sustained attention, emergent awareness and visual scanning to the left field of environment. Patient and family education ongoing. Patient would benefit from continued skilled SLP intervention to maximize his swallowing, cognitive and speech function prior to discharge.      Intensity: Minumum of 1-2 x/day, 30 to 90 minutes Frequency: 3 to 5 out of 7 days Duration/Length of Stay: 3/26 Treatment/Interventions: Cognitive  remediation/compensation;Speech/Language facilitation;Cueing hierarchy;Functional tasks;Internal/external aids;Dysphagia/aspiration precaution training;Patient/family education;Therapeutic Activities;Environmental  controls   Daily Session  Skilled Therapeutic Interventions:  Skilled treatment session focused on dysphagia and cognitive goals. SLP facilitated session by providing skilled observation with lunch meal of Dys. 2 textures with nectar-thick liquids. Patient consumed meal without overt s/s of aspiration but required Min verbal cues for utilization of swallowing compensatory strategies and to self-monitor and correct left anterior spillage. Recommend patient continue current diet. SLP also facilitated session by providing supervision level verbal cues for basic problem solving during a money management task. Patient transferred back to bed at end of session via the [facility] and required total A for safety with task. Patient left upright in bed with alarm on and all needs within reach. Continue with current plan of care.      Pain Pain Assessment Pain Scale: 0-10 Pain Score: 0-No pain Faces Pain Scale: No hurt Multiple Pain Sites: No  Therapy/Group: Individual Therapy  Estell Puccini 10/03/2019, 3:26 PM

## 2019-10-04 ENCOUNTER — Inpatient Hospital Stay (HOSPITAL_COMMUNITY): Payer: Self-pay | Admitting: Physical Therapy

## 2019-10-04 ENCOUNTER — Inpatient Hospital Stay (HOSPITAL_COMMUNITY): Payer: Self-pay

## 2019-10-04 ENCOUNTER — Inpatient Hospital Stay (HOSPITAL_COMMUNITY): Payer: Self-pay | Admitting: Speech Pathology

## 2019-10-04 ENCOUNTER — Inpatient Hospital Stay (HOSPITAL_COMMUNITY): Payer: Self-pay | Admitting: Occupational Therapy

## 2019-10-04 LAB — GLUCOSE, CAPILLARY: Glucose-Capillary: 146 mg/dL — ABNORMAL HIGH (ref 70–99)

## 2019-10-04 MED ORDER — TOPIRAMATE 25 MG PO TABS
75.0000 mg | ORAL_TABLET | Freq: Two times a day (BID) | ORAL | Status: DC
  Administered 2019-10-04 – 2019-10-16 (×24): 75 mg via ORAL
  Filled 2019-10-04 (×24): qty 3

## 2019-10-04 NOTE — Progress Notes (Signed)
Occupational Therapy Session Note  Patient Details  Name: Paul [name] MRN: [id number] Date of Birth: [birth year]  Today's Date: 10/04/2019 OT Individual Time: 1103-1200 OT Individual Time Calculation (min): 57 min   Short Term Goals: Week 2:  OT Short Term Goal 1 (Week 2): Pt will complete shower transfer with 1 assist OT Short Term Goal 2 (Week 2): Pt will maintain sitting balance at EOB with no more than Mod A for balance within BADL task OT Short Term Goal 3 (Week 2): Pt will maintain standing balance with mod A within BADL task  Skilled Therapeutic Interventions/Progress Updates:    Pt greeted semi-reclined in bed asleep, easy to wake, and agreeable to OT treatment session. Pt did not complain of headache this session. Pt declined to shower, but wanted to wash up a little at the sink. Pt completed bed mobility with mod A to advance  L side. Squat-pivot to wc on R side with min A. Pt brought to the sink in wc and tried to shave, but electric razor was not charged enough. OT placed razor on charger. Worked on UB bathing with OT providing hand over hand A to integrate L UE into bathing tasks. L NMR with weight bearing on sink with all sit<>stands. Sit<>stands with min A overall. OT assist for L LE to be placed in figure 4 position, then pt able to assist with threading pant leg. Min A sit<>stand, then mod A for balance while pt tried to pull up pants. Worked on use of L UE as a stabilizer to hold toothbrush and toothpaste to open lid. Pt demonstrated understanding and was able to brush teeth with min set-up A. OT administer water via TSP per water protocol. Pt took 15 sips with OT education to take break and make sure he completed swallow in between sips. Pt coughed on 5/15 of the sips and was able to take 5 sips in a row without coughing. OT placed alarm belt on with L UE supported on lap tray. OT placed SAEBO e-stim on wrist extensors. SAEBO left on for 60 minutes. OT returned to remove SAEBO with skin  intact and no adverse reactions.  Sa[place]o Stim One 330 pulse width 35 Hz pulse rate On 8 sec/ off 8 sec Ramp up/ down 2 sec Symmetrical Biphasic wave form  Max intensity 169mA at 500 Ohm load  Therapy Documentation Precautions:  Precautions Precautions: Fall Precaution Comments: left sided weakness and left lateral lean, SBP <160 Restrictions Weight Bearing Restrictions: No Other Position/Activity Restrictions: L PRAFO and WHO donned at rest Pain: Pain Assessment Pain Scale: 0-10 No pain Therapy/Group: Individual Therapy  Valma Cava 10/04/2019, 12:23 PM

## 2019-10-04 NOTE — Progress Notes (Signed)
Speech Language Pathology Daily Session Note  Patient Details  Name: Paul [name] MRN: [id number] Date of Birth: Nov 10, 1965  Today's Date: 10/04/2019 SLP Individual Time: 1335-1400 SLP Individual Time Calculation (min): 25 min  Short Term Goals: Week 2: SLP Short Term Goal 1 (Week 2): Pt will consume current diet with minmal overt s/sx aspiration and demonstrate efficient mastication and oral clearance with Supervision A verbal cues for use of swallow strategies. SLP Short Term Goal 2 (Week 2): Pt will consume trials of thin liquids over 3 sessions with minimal overt s/sx aspiration with Min verbal cues to assess readiness for repeat MBSS. SLP Short Term Goal 3 (Week 2): Pt will demonstrate ability to problem solve functional, familiar and basic tasks with Min A verbal/visual cues. SLP Short Term Goal 4 (Week 2): Patient will demonstrate sustained attention to functional tasks for ~20 minutes with Min verbal cues for redirection. SLP Short Term Goal 5 (Week 2): Patient will attend to left field of enviornment during functional tasks with Min verbal cues. SLP Short Term Goal 6 (Week 2): Pt will use speech intelligibility strategies to increase intelligibility to 90% at the sentence level with supervision cues.  Skilled Therapeutic Interventions: Skilled treatment session focused on cognitive goals. SLP facilitated session by providing extra time and overall Min A verbal cues for problem solving during a basic reading comprehension task that focused on medications labels/instructions with 70% accuracy. Patient required Mod A verbal cues for use of speech intelligibility strategies at the phrase level while reading aloud for ~75% intelligibility. Patient left upright in wheelchair with all needs within reach and alarm on. Continue with current plan of care.      Pain Pain Assessment Pain Score: 5   Therapy/Group: Individual Therapy  Paul [name] 10/04/2019, 2:56 PM

## 2019-10-04 NOTE — Progress Notes (Addendum)
Physical Therapy Session Note  Patient Details  Name: Paul [name] MRN: [id number] Date of Birth: November 24, 1965  Today's Date: 10/04/2019 PT Individual Time: [id number] and 1420-1500 PT Individual Time Calculation (min): 71 min and 40 min  Short Term Goals: Week 2:  PT Short Term Goal 1 (Week 2): Pt will propel w/c 75 ft with mod assist. PT Short Term Goal 2 (Week 2): Pt will consistently complete transfers to L with mod assist. PT Short Term Goal 3 (Week 2): Pt will demonstrate standing balance with mod assist x 3 minutes.  Skilled Therapeutic Interventions/Progress Updates:  Treatment 1: Pt received in bed & agreeable to tx.  Supine>sit with min assist with HOB elevated, bed rails, with pt requiring assistance to move LLE to EOB. Stand pivot transfers to L with max assist & 2nd person SBA for safety, mod assist stand pivot to R with ongoing cuing for overall technique and awareness of surface he's transferring to.  Addressed sitting/standing balance and midline orientation with pt requiring min<>max assist for sitting balance, and max +1-2 assist for standing balance. Pt demonstrates R lateral lean with decreased weight bearing through LLE as pt primarily uses RLE for weight bearing.  While sitting EOM pt engaged in hitting beach ball with RUE then kicking soccer ball with focus on dynamic balance, core/trunk control, with pt requiring min<>max assist.  Attempted AAROM/PROM in LLE for knee extension but no activation noted. Sit ups from theraball with focus on core strengthening and trunk control with max assist. Pt very frustrated re: CLOF & situation with therapist providing education re: stroke recovery & encouragement. Requested neuropsych consult from MD. Gait with 3 muskateers method x 4 ft + 4 ft + 10 ft with pt requiring total assist for advancing LLE & blocking knee to prevent buckling, manual facilitation for weight shifting R to allow increased ease of LLE foot advancement, and cuing for  RLE stepping pattern with task focusing on LLE weight bearing for strengthening & NMR.  At end of session pt left in w/c with chair alarm donned & all needs in reach.  Pain: pt c/o 4/10 HA - pain meds requested multiple times from nursing staff during session    Treatment 2: Pt received in w/c & agreeable to tx. Transported pt to/from outside [place] tower to increase pt's mood while being outside. Pt engaged in conversation with focus on scanning to L of midline and discussing pt's recovery and his current expectations with his functional mobility. Pt propels w/c 150 ft + 100 ft outdoors over uneven surfaces with mod assist 2/2 decreased attention to L. Pt reports he plans to d/c to either his brother or his son's house that are 1 level with 3 STE & pt's brother arrives & confirms this, but also states he & pt's son plans to install a ramp prior to pt's d/c. Back inside, pt propels w/c [place] unit>his room with R hemi technique and min assist. Pt assisted w/c>bed with min assist via stand pivot to R with much improved eccentric control with cuing to demonstrate, sit>supine with min assist for LLE. Pt's brother with mask below nose & therapist educated him on need to properly wear mask over mouth & nose at all times. Provided pt's brother with home measurement sheet & educated him & pt on anticipation of pt discharging home at w/c level. Pt left in bed with alarm set, all needs in reach. Pain: no c/o pain reported during session   Therapy Documentation Precautions:  Precautions Precautions:  Fall Precaution Comments: left sided weakness and left lateral lean, SBP <160 Restrictions Weight Bearing Restrictions: No Other Position/Activity Restrictions: L PRAFO and WHO donned at rest    Therapy/Group: Individual Therapy  [name] 10/04/2019, 3:18 PM

## 2019-10-04 NOTE — Plan of Care (Signed)
  Problem: Consults Goal: RH GENERAL PATIENT EDUCATION Description: See Patient Education module for education specifics. Outcome: Progressing Goal: Skin Care Protocol Initiated - if Braden Score 18 or less Description: If consults are not indicated, leave blank or document N/A Outcome: Progressing Goal: Nutrition Consult-if indicated Outcome: Progressing Goal: Diabetes Guidelines if Diabetic/Glucose > 140 Description: If diabetic or lab glucose is > 140 mg/dl - Initiate Diabetes/Hyperglycemia Guidelines & Document Interventions  Outcome: Progressing   Problem: RH BOWEL ELIMINATION Goal: RH STG MANAGE BOWEL WITH ASSISTANCE Description: STG Manage Bowel with min Assistance. Outcome: Progressing   Problem: RH SKIN INTEGRITY Goal: RH STG MAINTAIN SKIN INTEGRITY WITH ASSISTANCE Description: STG Maintain Skin Integrity With min Assistance. Outcome: Progressing   Problem: RH SAFETY Goal: RH STG ADHERE TO SAFETY PRECAUTIONS W/ASSISTANCE/DEVICE Description: STG Adhere to Safety Precautions With cues and reminders Outcome: Progressing Goal: RH STG DECREASED RISK OF FALL WITH ASSISTANCE Description: STG Decreased Risk of Fall With cues and reminders Outcome: Progressing   Problem: RH PAIN MANAGEMENT Goal: RH STG PAIN MANAGED AT OR BELOW PT'S PAIN GOAL Description: Pain level less than 4 on scale of 0-10 Outcome: Progressing   Problem: RH KNOWLEDGE DEFICIT GENERAL Goal: RH STG INCREASE KNOWLEDGE OF SELF CARE AFTER HOSPITALIZATION Description: Pt will be able to adhere to safety precautions and demonstrate understanding of precautions to take to prevent injury with min assist.  Outcome: Progressing   Problem: RH Pre-functional/Other (Specify) Goal: RH LTG Pre-functional (Specify) Outcome: Progressing Goal: RH LTG Interdisciplinary (Specify) 1 Description: RH LTG Interdisciplinary (Specify)1 Outcome: Progressing Goal: RH LTG Interdisciplinary (Specify) 2 Description: RH LTG  Interdisciplinary (Specify) 2  Outcome: Progressing

## 2019-10-04 NOTE — Plan of Care (Signed)
  Problem: RH Memory Goal: LTG Patient will use memory compensatory aids to (SLP) Description: LTG:  Patient will use memory compensatory aids to recall biographical/new, daily complex information with cues (SLP) Flowsheets (Taken 10/04/2019 0630) LTG: Patient will use memory compensatory aids to (SLP): Minimal Assistance - Patient > 75% Note: Goal added, CMP   Problem: RH Attention Goal: LTG Patient will demonstrate this level of attention during functional activites (SLP) Description: LTG:  Patient will will demonstrate this level of attention during functional activites (SLP) Flowsheets (Taken 10/04/2019 0630) Patient will demonstrate during cognitive/linguistic activities the attention type of: Selective LTG: Patient will demonstrate this level of attention during cognitive/linguistic activities with assistance of (SLP): Supervision Number of minutes patient will demonstrate attention during cognitive/linguistic activities: 45 Note: Goal added, CMP

## 2019-10-04 NOTE — Patient Care Conference (Signed)
Inpatient RehabilitationTeam Conference and Plan of Care Update Date: 10/04/2019   Time: 10:35 AM   Patient Name: Paul [name]      Medical Record Number: [id number]  Date of Birth: May 29, 1966 Sex: Male         Room/Bed: 4W14C/4W14C-01 Payor Info: Payor: MEDICAID POTENTIAL / Plan: MEDICAID POTENTIAL / Product Type: *No Product type* /    Admit Date/Time:  09/24/2019  3:47 PM  Primary Diagnosis:  Thalamic hemorrhage [facility])  Patient Active Problem List   Diagnosis Date Noted  . AKI (acute kidney injury) (HCC)   . Dysphagia, post-stroke   . Prediabetes   . Acute bilateral low back pain without sciatica   . Vascular headache   . Thalamic hemorrhage (HCC) 09/24/2019  . Cerebral edema (HCC)   . Pure hypercholesterolemia   . ICH (intracerebral hemorrhage) (HCC) 09/17/2019  . Acute CVA (cerebrovascular accident) (HCC) 01/18/2019  . Thrombocytosis (HCC) 01/18/2019  . Pancreatitis 01/07/2019  . Chronic diastolic CHF (congestive heart failure) (HCC) 10/11/2018  . CKD (chronic kidney disease) stage 2, GFR 60-89 ml/min 10/11/2018  . Essential hypertension 05/22/2018  . Dyslipidemia 05/22/2018  . Renal lesion 05/22/2018  . Coronary artery calcification 05/22/2018  . Tobacco abuse 05/22/2018    Expected Discharge Date: Expected Discharge Date: 10/21/19  Team Members Present: Physician leading conference: Dr. Sula Soda Care Coodinator Present: Cecile Sheerer, LCSWA;Genie Elieser Tetrick, RN, MSN Nurse Present: Harle Battiest, RN PT Present: Aleda Grana, PT OT Present: Kearney Hard, OT SLP Present: Feliberto Gottron, SLP PPS Coordinator present : Edson Snowball, Park Breed, SLP     Current Status/Progress Goal Weekly Team Focus  Bowel/Bladder   Continent bowel and bladder, some urinary retention, q6hr bladder scans, last cathed 3/8, last BM 3/6  Remain continent of bowel and bladder, resolve retention  Bladder scan PRN, I/O cath >350, monitor for retention/incontinence    Swallow/Nutrition/ Hydration   Dys. 2 textures with nectar-thick liquids with water protocol, Min A for strategies  Mod I  use of swallow strategies, trials of upgraded textures/thins to assess readiness for repeat MBS   ADL's   Mod/max A overall, L UE continues to be flaccid, using SAEBO  CGA/min A  transfers, sit<>stands, sitting and standing balance, L NMR, NMES, self-care retraining   Mobility   min/mod assist bed mobilty in hospital bed, as little as min assist w/c mobility, min<>max sitting & standing balance, gait with 3 muskateers or light gait  currently supervision<>CGA overall at ambulatory level, min assist stairs with 1 rail + LRAD with plans to downgrade to w/c level min assist  sitting/standing balance, postural control, w/c mobility, bed mobility, transfers, gait, L NMR, cognition & awareness   Communication   Mod A  Mod I  increased use of speech intelligibility strategies   Safety/Cognition/ Behavioral Observations  Mod A  Mod I  complex proble solving, recall   Pain   Reports consistent headaches, utilizing PRN tylenol/tramadol/fioricet for pain  Reach tolerable pain level  Assess and treat pain using PRN medications   Skin   No evidence of skin breakdown  Maintain skin integrity  Monitor skin for signs of breakdown, reposition often    Rehab Goals Patient on target to meet rehab goals: Yes *See Care Plan and progress notes for long and short-term goals.     Barriers to Discharge  Current Status/Progress Possible Resolutions Date Resolved   Nursing                  PT  Home  environment access/layout;Behavior;Nutrition means;Decreased caregiver support  unsure of brother's home set up, unsure if family can provide physical 24 hr assist              OT                  SLP                SW                Discharge Planning/Teaching Needs:  D/c to his brother's home with 24/7 care  Family education as recommended by therapy   Team Discussion: Headaches,  drowsy in am, sleeping better, neuropsych eval pending.  RN cont, voids on occasion, cath d/t retaining urine.  OT mod/max overall, CGA/min A goals.  PT max stand pivot L, mod A pivot R, frustrated, min A bed, min/CGA goals, will downgrade goals.  SLP D2nectar, coughing with trials of thins, oral residue, poor recall, speech is low, hard to understand.  Will be going to brother's home at DC.   Revisions to Treatment Plan: N/A     Medical Summary Current Status: right thalamic infarct with left hemiparesis, inattention/sensory loss. polysubstance abuse. headaches, anxiety Weekly Focus/Goal: increase dose of topiramate, contiue intensive therapies, neuropsych eval, caregiver training of brother close to DC  Barriers to Discharge: Medical stability;Decreased family/caregiver support   Possible Resolutions to Barriers: increase dose of topiramate, contiue intensive therapies, neuropsych eval, caregiver training of brother close to DC   Continued Need for Acute Rehabilitation Level of Care: The patient requires daily medical management by a physician with specialized training in physical medicine and rehabilitation for the following reasons: Direction of a multidisciplinary physical rehabilitation program to maximize functional independence : Yes Medical management of patient stability for increased activity during participation in an intensive rehabilitation regime.: Yes Analysis of laboratory values and/or radiology reports with any subsequent need for medication adjustment and/or medical intervention. : Yes   I attest that I was present, lead the team conference, and concur with the assessment and plan of the team.   Lelon Frohlich M 10/04/2019, 8:11 PM   Team conference was held via web/ teleconference due to COVID - 19

## 2019-10-04 NOTE — Progress Notes (Signed)
PHYSICAL MEDICINE & REHABILITATION PROGRESS NOTE   Subjective/Complaints: Patient seen sitting up in bed this morning.  He states he did not stay well overnight due to headaches and back pain.  He notes headaches this a.m., and is about to receive pain medications.  He states he would like something to drink, discussed nectar consistency.  ROS: + Headaches, back pain.  Objective:   No results found. Recent Labs    10/03/19 0622  WBC 7.2  HGB 13.7  HCT 41.7  PLT 489*   Recent Labs    10/03/19 0622  NA 139  K 4.1  CL 105  CO2 25  GLUCOSE 119*  BUN 29*  CREATININE 1.35*  CALCIUM 10.0    Intake/Output Summary (Last 24 hours) at 10/04/2019 1032 Last data filed at 10/04/2019 0936 Gross per 24 hour  Intake 286.33 ml  Output 900 ml  Net -613.67 ml     Physical Exam: Vital Signs Blood pressure 121/78, pulse 73, temperature 98.2 F (36.8 C), temperature source Oral, resp. rate 20, height 6' (1.829 m), weight 72.7 kg, SpO2 100 %. Constitutional: No distress . Vital signs reviewed. HENT: Normocephalic.  Atraumatic. Eyes: EOMI. No discharge. Cardiovascular: No JVD. Respiratory: Normal effort.  No stridor. GI: Non-distended. Skin: Warm and dry.  Intact. Psych: Normal mood.  Normal behavior. Musc: No edema in extremities.  No tenderness in extremities. Neurological: Alert Motor: LUE/LE: 0/5 proximal distal No increase in tone noted Left facial weakness  Assessment/Plan: 1. Functional deficits secondary to right thalamic hemorrhage which require 3+ hours per day of interdisciplinary therapy in a comprehensive inpatient rehab setting.  Physiatrist is providing close team supervision and 24 hour management of active medical problems listed below.  Physiatrist and rehab team continue to assess barriers to discharge/monitor patient progress toward functional and medical goals  Care Tool:  Bathing    Body parts bathed by patient: Chest, Abdomen, Front perineal  area, Right upper leg, Left upper leg, Face   Body parts bathed by helper: Buttocks, Right arm, Left arm, Right lower leg, Left lower leg     Bathing assist Assist Level: Maximal Assistance - Patient 24 - 49%     Upper Body Dressing/Undressing Upper body dressing   What is the patient wearing?: Pull over shirt    Upper body assist Assist Level: Maximal Assistance - Patient 25 - 49%    Lower Body Dressing/Undressing Lower body dressing      What is the patient wearing?: Underwear/pull up, Pants     Lower body assist Assist for lower body dressing: Maximal Assistance - Patient 25 - 49%     Toileting Toileting Toileting Activity did not occur (Clothing management and hygiene only): N/A (no void or bm)  Toileting assist Assist for toileting: Dependent - Patient 0%     Transfers Chair/bed transfer  Transfers assist     Chair/bed transfer assist level: Maximal Assistance - Patient 25 - 49%     Locomotion Ambulation   Ambulation assist   Ambulation activity did not occur: Safety/medical concerns(Increased R hip pain in standing, pt unable to tolerance standing)  Assist level: 2 helpers Assistive device: Lite Gait Max distance: 7 ft   Walk 10 feet activity   Assist  Walk 10 feet activity did not occur: Safety/medical concerns(Increased R hip pain in standing, pt unable to tolerance standing)        Walk 50 feet activity   Assist Walk 50 feet with 2 turns activity did not occur:  Safety/medical concerns(Increased R hip pain in standing, pt unable to tolerance standing)         Walk 150 feet activity   Assist Walk 150 feet activity did not occur: Safety/medical concerns(Increased R hip pain in standing, pt unable to tolerance standing)         Walk 10 feet on uneven surface  activity   Assist Walk 10 feet on uneven surfaces activity did not occur: Safety/medical concerns(Increased R hip pain in standing, pt unable to tolerance standing)          Wheelchair     Assist   Type of Wheelchair: Manual    Wheelchair assist level: Minimal Assistance - Patient > 75% Max wheelchair distance: 150    Wheelchair 50 feet with 2 turns activity    Assist    Wheelchair 50 feet with 2 turns activity did not occur: Safety/medical concerns(unable without skilled intervention)   Assist Level: Minimal Assistance - Patient > 75%   Wheelchair 150 feet activity     Assist  Wheelchair 150 feet activity did not occur: Safety/medical concerns(unable without skilled intervention)   Assist Level: Minimal Assistance - Patient > 75%   Blood pressure 121/78, pulse 73, temperature 98.2 F (36.8 C), temperature source Oral, resp. rate 20, height 6' (1.829 m), weight 72.7 kg, SpO2 100 %.  Medical Problem List and Plan: 1.Left hemiparesis and visual-spatial deficitssecondary to right thalamic hemorrhage with intraventricular extension  Continue CIR  -WHO,PRAFO for LUE and LLE  -Team conference today: CG to MinA goals. DC date of 3/26. Can initiate respiratory therapy. Brother will be involved in caregiver training and can provide 24/7 supervision upon discharge.  2. Antithrombotics:  -DVT/anticoagulation:Mechanical:Sequential compression devices, below kneeBilateral lower extremities   Dopplers negative for DVT -antiplatelet therapy: N/a 3.Headaches/Pain Management:Fioricet or ultram prnhaven't helped a great deal.  Topamax increased to 50mg  bid on 3/1  Amitriptyline increased to 50qHS for both headache and insomnia on 3/7.   Will increase Topamax to 75mg  BID for better control of headaches.   K pad ordered for low back pain 4. Mood:LCSW to follow for evaluation and support. -antipsychotic agents: N/A  -Will request neuropsych input, gets frustrated easily and says he is "pitiful" and doing horribly.  5. Neuropsych: This patientiscapable of making decisions on hisown behalf. 6.  Skin/Wound Care:Routine pressure relief measures. 7. Fluids/Electrolytes/Nutrition:Monitor I/Os. Offer nectar liquids between meals to maintain adequate hydration. 8. HTN: Monitor BP tid-continue Hydralazine, Cozaar, Coreg, Catapres and Norvasc. TItrateas indicated--SBP goal<140.  Controlled on 3/8 9. Prediabetes: Hgb A1C- 6.2--was 5.7 eight months ago. Will have RD educate on CM diet--- monitor BS ac/hs with SSI for now.   Mildly elevated on 3/8 10. Tobacco/Cannabis use: Reports that he plans on quitting.  11. Dyslipidemia: Statin on hold due to ICH--resume at discharge. 12.  Post stroke dysphagia: doesn't like consistency but is tolerating due to nectars so far with supervision 13.  AKI  Creatinine 1.35 on 3/8  Encourage fluids  IVF to adjust x3 night started on 3/8, echo reviewed with EF of 55-60%  See #12 15. Urinary retention: cathing every 8 hours since volumes are low.   LOS: 10 days A FACE TO FACE EVALUATION WAS PERFORMED  Clide Deutscher Timithy Arons 10/04/2019, 10:32 AM

## 2019-10-05 ENCOUNTER — Inpatient Hospital Stay (HOSPITAL_COMMUNITY): Payer: Self-pay | Admitting: Occupational Therapy

## 2019-10-05 ENCOUNTER — Encounter (HOSPITAL_COMMUNITY): Payer: Self-pay | Admitting: Physical Medicine & Rehabilitation

## 2019-10-05 ENCOUNTER — Inpatient Hospital Stay (HOSPITAL_COMMUNITY): Payer: Self-pay

## 2019-10-05 ENCOUNTER — Inpatient Hospital Stay (HOSPITAL_COMMUNITY): Payer: Self-pay | Admitting: Speech Pathology

## 2019-10-05 MED ORDER — HYDROCODONE-ACETAMINOPHEN 10-325 MG PO TABS: 1.0000 | ORAL_TABLET | Freq: Two times a day (BID) | ORAL | Status: DC | PRN

## 2019-10-05 MED ORDER — AMLODIPINE BESYLATE 5 MG PO TABS
5.0000 mg | ORAL_TABLET | Freq: Every day | ORAL | Status: DC
  Administered 2019-10-06: 5 mg via ORAL
  Filled 2019-10-05: qty 1

## 2019-10-05 MED ORDER — HYDROCODONE-ACETAMINOPHEN 7.5-325 MG PO TABS
1.0000 | ORAL_TABLET | Freq: Two times a day (BID) | ORAL | Status: AC | PRN
  Administered 2019-10-05 – 2019-10-06 (×3): 1 via ORAL
  Filled 2019-10-05 (×5): qty 1

## 2019-10-05 NOTE — Progress Notes (Signed)
[facility] PHYSICAL MEDICINE & REHABILITATION PROGRESS NOTE   Subjective/Complaints: Mr. Spaulding continues to complain of headache, says he woke up with one this morning. He has been sleeping better at night with Amitriptyline. As per therapists, he has been very down on himself during sessions.   ROS: + Headaches, back pain.  Objective:   No results found. Recent Labs    10/03/19 0622  WBC 7.2  HGB 13.7  HCT 41.7  PLT 489*   Recent Labs    10/03/19 0622  NA 139  K 4.1  CL 105  CO2 25  GLUCOSE 119*  BUN 29*  CREATININE 1.35*  CALCIUM 10.0    Intake/Output Summary (Last 24 hours) at 10/05/2019 1035 Last data filed at 10/05/2019 0800 Gross per 24 hour  Intake 1724.09 ml  Output 875 ml  Net 849.09 ml     Physical Exam: Vital Signs Blood pressure 115/72, pulse (!) 51, temperature 98.3 F (36.8 C), temperature source Oral, resp. rate 16, height 6' (1.829 m), weight 72.7 kg, SpO2 99 %. Constitutional: No distress . Vital signs reviewed. Appears more alert this morning.  HENT: Normocephalic.  Atraumatic. Eyes: EOMI. No discharge. Cardiovascular: No JVD. Respiratory: Normal effort.  No stridor. GI: Non-distended. Skin: Warm and dry.  Intact. Psych: Normal mood.  Normal behavior. Musc: No edema in extremities.  No tenderness in extremities. Neurological: Alert Motor: LUE/LE: 0/5 proximal distal No increase in tone noted Left facial weakness  Assessment/Plan: 1. Functional deficits secondary to right thalamic hemorrhage which require 3+ hours per day of interdisciplinary therapy in a comprehensive inpatient rehab setting.  Physiatrist is providing close team supervision and 24 hour management of active medical problems listed below.  Physiatrist and rehab team continue to assess barriers to discharge/monitor patient progress toward functional and medical goals  Care Tool:  Bathing    Body parts bathed by patient: Chest, Abdomen, Front perineal area, Right  upper leg, Left upper leg, Face   Body parts bathed by helper: Buttocks, Right arm, Left arm, Right lower leg, Left lower leg     Bathing assist Assist Level: Maximal Assistance - Patient 24 - 49%     Upper Body Dressing/Undressing Upper body dressing   What is the patient wearing?: Pull over shirt    Upper body assist Assist Level: Maximal Assistance - Patient 25 - 49%    Lower Body Dressing/Undressing Lower body dressing      What is the patient wearing?: Underwear/pull up, Pants     Lower body assist Assist for lower body dressing: Maximal Assistance - Patient 25 - 49%     Toileting Toileting Toileting Activity did not occur (Clothing management and hygiene only): N/A (no void or bm)  Toileting assist Assist for toileting: Dependent - Patient 0%     Transfers Chair/bed transfer  Transfers assist     Chair/bed transfer assist level: Minimal Assistance - Patient > 75%     Locomotion Ambulation   Ambulation assist   Ambulation activity did not occur: Safety/medical concerns(Increased R hip pain in standing, pt unable to tolerance standing)  Assist level: 2 helpers Assistive device: (3 muskateers) Max distance: 20 ft   Walk 10 feet activity   Assist  Walk 10 feet activity did not occur: Safety/medical concerns(Increased R hip pain in standing, pt unable to tolerance standing)  Assist level: 2 helpers Assistive device: (3 muskateers)   Walk 50 feet activity   Assist Walk 50 feet with 2 turns activity did not occur: Safety/medical  concerns(Increased R hip pain in standing, pt unable to tolerance standing)         Walk 150 feet activity   Assist Walk 150 feet activity did not occur: Safety/medical concerns(Increased R hip pain in standing, pt unable to tolerance standing)         Walk 10 feet on uneven surface  activity   Assist Walk 10 feet on uneven surfaces activity did not occur: Safety/medical concerns(Increased R hip pain in  standing, pt unable to tolerance standing)         Wheelchair     Assist Will patient use wheelchair at discharge?: Yes Type of Wheelchair: Manual    Wheelchair assist level: Moderate Assistance - Patient 50 - 74% Max wheelchair distance: 150    Wheelchair 50 feet with 2 turns activity    Assist    Wheelchair 50 feet with 2 turns activity did not occur: Safety/medical concerns(unable without skilled intervention)   Assist Level: Moderate Assistance - Patient 50 - 74%   Wheelchair 150 feet activity     Assist  Wheelchair 150 feet activity did not occur: Safety/medical concerns(unable without skilled intervention)   Assist Level: Moderate Assistance - Patient 50 - 74%   Blood pressure 115/72, pulse (!) 51, temperature 98.3 F (36.8 C), temperature source Oral, resp. rate 16, height 6' (1.829 m), weight 72.7 kg, SpO2 99 %.  Medical Problem List and Plan: 1.Left hemiparesis and visual-spatial deficitssecondary to right thalamic hemorrhage with intraventricular extension  Continue CIR  -WHO,PRAFO for LUE and LLE  -Team conference 3/9: CG to MinA goals. DC date of 3/26. Can initiate respiratory therapy. Brother will be involved in caregiver training and can provide 24/7 supervision upon discharge.  2. Antithrombotics:  -DVT/anticoagulation:Mechanical:Sequential compression devices, below kneeBilateral lower extremities   Dopplers negative for DVT -antiplatelet therapy: N/a 3.Headaches/Pain Management:Fioricet or ultram prnhaven't helped a great deal.  Topamax increased to 50mg  bid on 3/1  Amitriptyline increased to 50qHS for both headache and insomnia on 3/7.   Will increase Topamax to 75mg  BID for better control of headaches.   K pad ordered for low back pain 4. Mood:LCSW to follow for evaluation and support. -antipsychotic agents: N/A  -Will request neuropsych input, gets frustrated easily and says he is  "pitiful" and doing horribly. Neuropsych eval scheduled for Friday. I counseled patient today that he has been working very hard and continuing to show improvements and he was very Patent attorney of comments.  5. Neuropsych: This patientiscapable of making decisions on hisown behalf. 6. Skin/Wound Care:Routine pressure relief measures. 7. Fluids/Electrolytes/Nutrition:Monitor I/Os. Offer nectar liquids between meals to maintain adequate hydration. 8. HTN: Monitor BP tid-continue Hydralazine, Cozaar, Coreg, Catapres and Norvasc. TItrateas indicated--SBP goal<140.  Controlled on 3/8, 3/10-actually has been soft and bradycardic. Will decrease Amlodipine to 5mg  daily.  9. Prediabetes: Hgb A1C- 6.2--was 5.7 eight months ago. Will have RD educate on CM diet--- monitor BS ac/hs with SSI for now.   Mildly elevated on 3/8, 3/10. Continue to monitor.  10. Tobacco/Cannabis use: Reports that he plans on quitting.  11. Dyslipidemia: Statin on hold due to ICH--resume at discharge. 12.  Post stroke dysphagia: doesn't like consistency but is tolerating due to nectars so far with supervision 13.  AKI  Creatinine 1.35 on 3/8  Encourage fluids  IVF to adjust x3 night started on 3/8, echo reviewed with EF of 55-60%  See #12 15. Urinary retention: cathing every 8 hours since volumes are low.   LOS: 11 days A FACE TO  FACE EVALUATION WAS PERFORMED  Clide Deutscher Lawton Dollinger 10/05/2019, 10:35 AM

## 2019-10-05 NOTE — Progress Notes (Signed)
Speech Language Pathology Daily Session Note  Patient Details  Name: Paul [name] MRN: [id number] Date of Birth: Mar 25, 1966  Today's Date: 10/05/2019 SLP Individual Time: [id number] SLP Individual Time Calculation (min): 55 min  Short Term Goals: Week 2: SLP Short Term Goal 1 (Week 2): Pt will consume current diet with minmal overt s/sx aspiration and demonstrate efficient mastication and oral clearance with Supervision A verbal cues for use of swallow strategies. SLP Short Term Goal 2 (Week 2): Pt will consume trials of thin liquids over 3 sessions with minimal overt s/sx aspiration with Min verbal cues to assess readiness for repeat MBSS. SLP Short Term Goal 3 (Week 2): Pt will demonstrate ability to problem solve functional, familiar and basic tasks with Min A verbal/visual cues. SLP Short Term Goal 4 (Week 2): Patient will demonstrate sustained attention to functional tasks for ~20 minutes with Min verbal cues for redirection. SLP Short Term Goal 5 (Week 2): Patient will attend to left field of enviornment during functional tasks with Min verbal cues. SLP Short Term Goal 6 (Week 2): Pt will use speech intelligibility strategies to increase intelligibility to 90% at the sentence level with supervision cues.  Skilled Therapeutic Interventions: Skilled treatment session focused on dysphagia and cognitive goals. SLP facilitated session by providing set-up assist with the suction toothbrush for oral care.  Patient consumed trials of thin liquids via tsp and cup with only 1 overt coughing episode with what appeared to be increased timeliness of swallow initiation. Patient's brother present and educated on clinical reasoning for the water protocol and appropriate textures as it pertains to bringing food from home. He verbalized understanding and was very receptive of information. SLP also facilitated session by providing Mod-Max A verbal cues for recall of daily information in order to record in a memory  notebook to maximize carryover. Patient noted to demonstrate mild perseveration of letters during written expression. Patient left upright in bed with alarm on and all needs within reach. Continue with current plan of care.      Pain No/Denies Pain   Therapy/Group: Individual Therapy  [name] 10/05/2019, 3:19 PM

## 2019-10-05 NOTE — Progress Notes (Signed)
Recreational Therapy Session Note  Patient Details  Name: Llewyn Buonocore MRN: [id number] Date of Birth: Dec 11, 1965 Today's Date: 10/05/2019 Time:  1105-12 Pain: c/o 9/10 HA, premedicated, nursing provided additions med Skilled Therapeutic Interventions/Progress Updates: Session focused on outdoor mobility, standing balance, transfers, pt education during co-treat with OT.  Pt propelled w/c on outdoor uneven surfaces with min assist, demonstration and verbal instruction for technique.  Pt stood with min assist +2 for L knee control with 1 UE support during weight shifting task.  Pt stated he was afraid of falling when standing & grabbed metal umbrella pole on outdoor table for support.    Pt performed stand pivot transfer w/c<->park bench with min assist.  Pt with questions about  how therapy tasks impacted recovery.  Provided answers with pt stating understanding/satisfaction.  Therapy/Group: Co-Treatment  Hughie Melroy 10/05/2019, 3:06 PM

## 2019-10-05 NOTE — Progress Notes (Signed)
Physical Therapy Session Note  Patient Details  Name: Paul [name] MRN: [id number] Date of Birth: [birth year]  Today's Date: 10/05/2019 PT Individual Time: 0850-1000 PT Individual Time Calculation (min): 70 min   Short Term Goals: Week 2:  PT Short Term Goal 1 (Week 2): Pt will propel w/c 75 ft with mod assist. PT Short Term Goal 2 (Week 2): Pt will consistently complete transfers to L with mod assist. PT Short Term Goal 3 (Week 2): Pt will demonstrate standing balance with mod assist x 3 minutes.  Skilled Therapeutic Interventions/Progress Updates:  Pt received in bed, initially requesting to "pass" on therapy 2/2 HA but with encouragement pt agreeable to tx. Donned B tennis shoes total assist for time management. Assisted pt to EOB & gait training x 20 ft x 2 with 3 muskateers style assist with cuing & manual facilitation for anterior pelvic shift, upright trunk posture, weight shifting R to allow increased ease of L foot advancement, total assist of L foot advancement & blocking at L knee to prevent buckling. Pt is unaware of therapist advancing LLE 2/2 impaired sensation & proprioception. Sit<>stand with min assist into stedy and pt engaged in dynavision with task focusing on scanning to L of midline & sitting balance while reaching with pt requiring min assist overall. Pt participated in tornado drill & during it engaged in conversation with focus on scanning L of midline to engage with therapist. Pt engaged in peg board activity from w/c level with focus on cognitive remediation (problem solving & sustained attention to task) as well as scanning to L to retreive pegs; pt was able to complete simple design from choice of many with only occasional cuing to continue task, then completed slightly more complex design from choice of many without cuing. At end of session pt left in w/c with chair alarm donned, all needs in reach.  Pt consumed thickened liquid during session with only 1 instance of  coughing after consuming sip.  Therapy Documentation Precautions:  Precautions Precautions: Fall Precaution Comments: left sided weakness and left lateral lean, SBP <160 Restrictions Weight Bearing Restrictions: No Other Position/Activity Restrictions: L PRAFO and WHO donned at rest  Pain: 7/10 HA - nurse made aware & reports pt has received all pain medication he can, attempted to do therapy in rooms with dimmed lights/lights off but pt reports this does not help. Rest breaks provided PRN.  Therapy/Group: Individual Therapy  [name] 10/05/2019, 10:03 AM

## 2019-10-05 NOTE — Progress Notes (Signed)
Social Work [name], male   DOB: 01/21/1966, 54 y.o.   MRN: [id number]   SW spoke with Jessica/Financial Counselor [phone]) to discuss Medicaid application status. Indicates she will continue to review pt chart. Would like to know if pt will have a disability greater than 12 months, and is pt able to comprehend if he is asked to sign forms. SW waiting on follow-up from medical team.  Loralee Pacas, MSW, [place] Office: [phone] Cell: [phone] Fax: [phone]

## 2019-10-05 NOTE — Progress Notes (Signed)
Occupational Therapy Session Note  Patient Details  Name: Paul [name] MRN: [id number] Date of Birth: May 20, 1966  Today's Date: 10/05/2019 OT Individual Time: 1100-1200 OT Individual Time Calculation (min): 60 min    Short Term Goals: Week 1:  OT Short Term Goal 1 (Week 1): Pt will don overhead shirt with Mod A OT Short Term Goal 1 - Progress (Week 1): Met OT Short Term Goal 2 (Week 1): Pt will complete BSC or toilet transfer with 1 assist OT Short Term Goal 2 - Progress (Week 1): Met OT Short Term Goal 3 (Week 1): Pt will complete shower transfer with 1 assist OT Short Term Goal 3 - Progress (Week 1): Progressing toward goal  Skilled Therapeutic Interventions/Progress Updates:    1:1. Pt received in w/c reporting HA 9/10 with LPN providing medication. Pt reporting wanting to complete oral care. MAX A provided to put LUE into WB position and use as stabilizer. Pt requires min VC for terminating task appropriately. OT applies estim to deltoid to A aproximation of shoulder joint, decrease pain and reduce risk of sublux d/t flaccid UE. Pt reports feeling good. Only maintain 15 min d/t decreased sticky pads falling off with other mobility. Pt completes standing at outdoor courtyard with edu re weight shifting and improved quad activation/weight bearing in LLE with later standing trials with MIN A +2/L knee block. Transfer with MIN A to standard park bench for various surface height transfer training. Exited session with pt seated in bed, exit alarm on and call light in reach.  Saebo Stim One applied to wrist extensors at end of session for 60 min with skin in tact and good response. 330 pulse width 35 Hz pulse rate On 8 sec/ off 8 sec Ramp up/ down 2 sec Symmetrical Biphasic wave form  Max intensity 159m at 500 Ohm load   Therapy Documentation Precautions:  Precautions Precautions: Fall Precaution Comments: left sided weakness and left lateral lean, SBP <160 Restrictions Weight Bearing  Restrictions: No Other Position/Activity Restrictions: L PRAFO and WHO donned at rest General:   Vital Signs:  Pain:   ADL: ADL Eating: Not assessed Grooming: Moderate assistance Where Assessed-Grooming: Edge of bed Upper Body Bathing: Moderate assistance Where Assessed-Upper Body Bathing: Edge of bed Lower Body Bathing: Maximal assistance Where Assessed-Lower Body Bathing: Edge of bed Upper Body Dressing: Maximal assistance Where Assessed-Upper Body Dressing: Edge of bed Lower Body Dressing: Maximal assistance Where Assessed-Lower Body Dressing: Edge of bed Toileting: Not assessed Toilet Transfer: Not assessed Tub/Shower Transfer: Not assessed Vision   Perception    Praxis   Exercises:   Other Treatments:     Therapy/Group: Individual Therapy  STonny Branch3/04/2020, 12:15 PM

## 2019-10-06 ENCOUNTER — Inpatient Hospital Stay (HOSPITAL_COMMUNITY): Payer: Self-pay | Admitting: Speech Pathology

## 2019-10-06 ENCOUNTER — Inpatient Hospital Stay (HOSPITAL_COMMUNITY): Payer: Self-pay | Admitting: *Deleted

## 2019-10-06 ENCOUNTER — Inpatient Hospital Stay (HOSPITAL_COMMUNITY): Payer: Self-pay | Admitting: Occupational Therapy

## 2019-10-06 MED ORDER — AMLODIPINE BESYLATE 2.5 MG PO TABS
2.5000 mg | ORAL_TABLET | Freq: Every day | ORAL | Status: DC
  Administered 2019-10-07: 2.5 mg via ORAL
  Filled 2019-10-06: qty 1

## 2019-10-06 NOTE — Progress Notes (Signed)
Occupational Therapy Session Note  Patient Details  Name: Paul [name] MRN: [id number] Date of Birth: [birth year]  Today's Date: 10/06/2019 OT Individual Time: 2620-3559 OT Individual Time Calculation (min): 70 min    Short Term Goals: Week 2:  OT Short Term Goal 1 (Week 2): Pt will complete shower transfer with 1 assist OT Short Term Goal 2 (Week 2): Pt will maintain sitting balance at EOB with no more than Mod A for balance within BADL task OT Short Term Goal 3 (Week 2): Pt will maintain standing balance with mod A within BADL task  Skilled Therapeutic Interventions/Progress Updates:    Pt greeted semi-reclined in bed and agreeable to OT treatment session. Pt reports small headache, but feeling much better. Pt agreeable to shower today. OT placed BSC in shower for more trunk support when bathing. Pt came to sitting EOB with mod A. OT used Stedy to transfer to the shower with min A to stand in [place]. Pt able to maintain perched posture on Stedy with CGA while transferring into shower. PT with improved initiation to wash body parts today. Hand over hand A to integrate L UE into bathing tasks for neuro re-ed. Pt transferred out of shower with min A using Stedy. Worked on shaving at the sink Medical sales representative with min A. Worked on UB and LB dressing with mod A for UB and mod/max A for LB dressing. Pt with min A sit<>stands and min/mod A for standing balance. OT wrote in memory notebook and reviewed goals of session with pt. OT placed SAEBO e-stim on shoulder to help approximate shoulder joint and decrease subluxation. SAEBO left on for 60 minutes. OT returned to remove SAEBO with skin intact and no adverse reactions.  Saebo Stim One 330 pulse width 35 Hz pulse rate On 8 sec/ off 8 sec Ramp up/ down 2 sec Symmetrical Biphasic wave form  Max intensity 145m at 500 Ohm load  Pt left with alarm belt on, call bell in reach, and needs met.    Therapy Documentation Precautions:   Precautions Precautions: Fall Precaution Comments: left sided weakness and left lateral lean, SBP <160 Restrictions Weight Bearing Restrictions: No Other Position/Activity Restrictions: L PRAFO and WHO donned at rest Pain:   Pt reports small headache, but did not report any more headache through duration of session.   Therapy/Group: Individual Therapy  EValma Cava3/05/2020, 8:48 AM

## 2019-10-06 NOTE — Progress Notes (Signed)
[facility] PHYSICAL MEDICINE & REHABILITATION PROGRESS NOTE   Subjective/Complaints: Paul [name] says he is feeling so-so this morning. Has some headache.  He has been sleeping better at night with Amitriptyline. As per therapists, he has been very down on himself during sessions. He will be speaking with neuropsych tomorrow.   ROS: + Headaches, back pain.  Objective:   No results found. No results for input(s): WBC, HGB, HCT, PLT in the last 72 hours. No results for input(s): NA, K, CL, CO2, GLUCOSE, BUN, CREATININE, CALCIUM in the last 72 hours.  Intake/Output Summary (Last 24 hours) at 10/06/2019 0853 Last data filed at 10/06/2019 0630 Gross per 24 hour  Intake 240 ml  Output 1250 ml  Net -1010 ml     Physical Exam: Vital Signs Blood pressure 115/72, pulse (!) 52, temperature 97.6 F (36.4 C), temperature source Oral, resp. rate 18, height 6' (1.829 m), weight 72.7 kg, SpO2 98 %. Constitutional: No distress . Vital signs reviewed. Appears more alert this morning. Eating breakfast.  HENT: Normocephalic.  Atraumatic. Eyes: EOMI. No discharge. Cardiovascular: No JVD. Respiratory: Normal effort.  No stridor. GI: Non-distended. Skin: Warm and dry.  Intact. Psych: Normal mood.  Normal behavior. Musc: No edema in extremities.  No tenderness in extremities. Neurological: Alert Motor: LUE/LE: 0/5 proximal distal No increase in tone noted Left facial weakness  Assessment/Plan: 1. Functional deficits secondary to right thalamic hemorrhage which require 3+ hours per day of interdisciplinary therapy in a comprehensive inpatient rehab setting.  Physiatrist is providing close team supervision and 24 hour management of active medical problems listed below.  Physiatrist and rehab team continue to assess barriers to discharge/monitor patient progress toward functional and medical goals  Care Tool:  Bathing    Body parts bathed by patient: Chest, Abdomen, Front perineal area, Right  upper leg, Left upper leg, Face   Body parts bathed by helper: Buttocks, Right arm, Left arm, Right lower leg, Left lower leg     Bathing assist Assist Level: Maximal Assistance - Patient 24 - 49%     Upper Body Dressing/Undressing Upper body dressing   What is the patient wearing?: Pull over shirt    Upper body assist Assist Level: Maximal Assistance - Patient 25 - 49%    Lower Body Dressing/Undressing Lower body dressing      What is the patient wearing?: Underwear/pull up, Pants     Lower body assist Assist for lower body dressing: Maximal Assistance - Patient 25 - 49%     Toileting Toileting Toileting Activity did not occur (Clothing management and hygiene only): N/A (no void or bm)  Toileting assist Assist for toileting: Dependent - Patient 0%     Transfers Chair/bed transfer  Transfers assist     Chair/bed transfer assist level: Minimal Assistance - Patient > 75%     Locomotion Ambulation   Ambulation assist   Ambulation activity did not occur: Safety/medical concerns(Increased R hip pain in standing, pt unable to tolerance standing)  Assist level: 2 helpers Assistive device: (3 muskateers) Max distance: 20 ft   Walk 10 feet activity   Assist  Walk 10 feet activity did not occur: Safety/medical concerns(Increased R hip pain in standing, pt unable to tolerance standing)  Assist level: 2 helpers Assistive device: (3 muskateers)   Walk 50 feet activity   Assist Walk 50 feet with 2 turns activity did not occur: Safety/medical concerns(Increased R hip pain in standing, pt unable to tolerance standing)  Walk 150 feet activity   Assist Walk 150 feet activity did not occur: Safety/medical concerns(Increased R hip pain in standing, pt unable to tolerance standing)         Walk 10 feet on uneven surface  activity   Assist Walk 10 feet on uneven surfaces activity did not occur: Safety/medical concerns(Increased R hip pain in  standing, pt unable to tolerance standing)         Wheelchair     Assist Will patient use wheelchair at discharge?: Yes Type of Wheelchair: Manual    Wheelchair assist level: Moderate Assistance - Patient 50 - 74% Max wheelchair distance: 150    Wheelchair 50 feet with 2 turns activity    Assist    Wheelchair 50 feet with 2 turns activity did not occur: Safety/medical concerns(unable without skilled intervention)   Assist Level: Moderate Assistance - Patient 50 - 74%   Wheelchair 150 feet activity     Assist  Wheelchair 150 feet activity did not occur: Safety/medical concerns(unable without skilled intervention)   Assist Level: Moderate Assistance - Patient 50 - 74%   Blood pressure 115/72, pulse (!) 52, temperature 97.6 F (36.4 C), temperature source Oral, resp. rate 18, height 6' (1.829 m), weight 72.7 kg, SpO2 98 %.  Medical Problem List and Plan: 1.Left hemiparesis and visual-spatial deficitssecondary to right thalamic hemorrhage with intraventricular extension  Continue CIR  -WHO,PRAFO for LUE and LLE  -Team conference 3/9: CG to MinA goals. DC date of 3/26. Can initiate respiratory therapy. Brother will be involved in caregiver training and can provide 24/7 supervision upon discharge.  2. Antithrombotics:  -DVT/anticoagulation:Mechanical:Sequential compression devices, below kneeBilateral lower extremities   Dopplers negative for DVT -antiplatelet therapy: N/a 3.Headaches/Pain Management:Fioricet or ultram prnhaven't helped a great deal.  Topamax increased to 50mg  bid on 3/1  Amitriptyline increased to 50qHS for both headache and insomnia on 3/7.   Will increase Topamax to 75mg  BID for better control of headaches.   K pad ordered for low back pain 4. Mood:LCSW to follow for evaluation and support. -antipsychotic agents: N/A  -Will request neuropsych input, gets frustrated easily and says he is  "pitiful" and doing horribly. Neuropsych eval scheduled for Friday. I counseled patient today that he has been working very hard and continuing to show improvements and he was very Patent attorney of comments.  5. Neuropsych: This patientiscapable of making decisions on hisown behalf. 6. Skin/Wound Care:Routine pressure relief measures. 7. Fluids/Electrolytes/Nutrition:Monitor I/Os. Offer nectar liquids between meals to maintain adequate hydration. 8. HTN: Monitor BP tid-continue Hydralazine, Cozaar, Coreg, Catapres and Norvasc. TItrateas indicated--SBP goal<140.  Controlled on 3/8, 3/10-actually has been soft and bradycardic. Will decrease Amlodipine to 5mg  daily.  3/11: Will decrease Amlodipine further to 2.5mg .  9. Prediabetes: Hgb A1C- 6.2--was 5.7 eight months ago. Will have RD educate on CM diet--- monitor BS ac/hs with SSI for now.   Mildly elevated on 3/8, 3/10. Continue to monitor.   3/11: has been well controlled; now off daily CBGs and SSI 10. Tobacco/Cannabis use: Reports that he plans on quitting.  11. Dyslipidemia: Statin on hold due to ICH--resume at discharge. 12.  Post stroke dysphagia: doesn't like consistency but is tolerating due to nectars so far with supervision 13.  AKI  Creatinine 1.35 on 3/8  Encourage fluids  IVF to adjust x3 night started on 3/8, echo reviewed with EF of 55-60%  See #12 15. Urinary retention: cathing every 8 hours since volumes are low.  16. Disposition: Brother has been present  and is receiving education  LOS: 12 days A FACE TO FACE EVALUATION WAS PERFORMED  Paul [name] [employer] 10/06/2019, 8:53 AM

## 2019-10-06 NOTE — Progress Notes (Signed)
Social Work [name], male   DOB: Jun 15, 1966, 54 y.o.   MRN: [id number]   SW left message for Janett Billow Wirght/Financial Counselor [phone]) to inform on letter of disability for pt, and recommends pt have support from a family member when answering questions. SW waiting on follow-up to send letter for pt disability.   Loralee Pacas, MSW, [place] Office: [phone] Cell: [phone] Fax: [phone]

## 2019-10-06 NOTE — Progress Notes (Signed)
Physical Therapy Session Note  Patient Details  Name: Paul [name] MRN: [id number] Date of Birth: 1966-01-27  Today's Date: 10/06/2019 PT Individual Time: 1000-1054 PT Individual Time Calculation (min): 54 min   Short Term Goals: Week 2:  PT Short Term Goal 1 (Week 2): Pt will propel w/c 75 ft with mod assist. PT Short Term Goal 2 (Week 2): Pt will consistently complete transfers to L with mod assist. PT Short Term Goal 3 (Week 2): Pt will demonstrate standing balance with mod assist x 3 minutes.  Skilled Therapeutic Interventions/Progress Updates:   Pt in w/c and agreeable to therapy, no c/o or evidence of pain throughout session. Pt self-propelled w/c to therapy gym w/ min assist via R hemi technique. Verbal and visual cues for L obstacle avoidance. Worked on sit<>stands, standing balance/midline orientation, and WB on LLE while performing RUE tasks at high/low table. Pt needed mod-max assist at first for static standing balance. Therapist providing max-total assist to block L knee, min manual cues at L glut musculature for neutral L hip extension and mod assist for lateral weight shifting at pelvis. Verbal cues for equal weight distribution in static stance. Rec therapist assisting pt w/ performing RUE task of putting together pipe tree. Pt w/ poor frustration tolerance, would impulsively sit each time. Rec therapist and this therapist engaged pt in discussion of things he can do by himself successfully, expectations for continued stroke recovery, and encouragement to continue despite not seeing immediate gains. Pt repeatedly stated "I can't do anything by myself". Pt also frustrated w/ difficulty w/ pipe tree task however educated him on dual task costs of performing task in standing, repeated the task in seated and pt much more successful. Performed 2 more stands w/ only min assist to boost and for static standing balance, however continues to need total assist to block L knee. Pt requesting water.  Returned to room and pt performed oral care at sink w/ supervision. Pointed out to pt that this was a task he was able to perform by himself, however no improvement in mood. Pt took 5 sips of water via teaspoon, mild coughing on 4th and 5th sips but was able to clear. Stand pivot to EOB and ended session in supine, all needs in reach.   Therapy Documentation Precautions:  Precautions Precautions: Fall Precaution Comments: left sided weakness and left lateral lean, SBP <160 Restrictions Weight Bearing Restrictions: No Other Position/Activity Restrictions: L PRAFO and WHO donned at rest  Therapy/Group: Individual Therapy  Alaynah Schutter Clent Demark 10/06/2019, 10:57 AM

## 2019-10-06 NOTE — Progress Notes (Signed)
Speech Language Pathology Daily Session Note  Patient Details  Name: Paul [name] MRN: [id number] Date of Birth: 22-Nov-1965  Today's Date: 10/06/2019 SLP Individual Time: 1400-1430 SLP Individual Time Calculation (min): 30 min and Today's Date: 10/06/2019 SLP Missed Time: 30 Minutes Missed Time Reason: Patient fatigue;Pain  Short Term Goals: Week 2: SLP Short Term Goal 1 (Week 2): Pt will consume current diet with minmal overt s/sx aspiration and demonstrate efficient mastication and oral clearance with Supervision A verbal cues for use of swallow strategies. SLP Short Term Goal 2 (Week 2): Pt will consume trials of thin liquids over 3 sessions with minimal overt s/sx aspiration with Min verbal cues to assess readiness for repeat MBSS. SLP Short Term Goal 3 (Week 2): Pt will demonstrate ability to problem solve functional, familiar and basic tasks with Min A verbal/visual cues. SLP Short Term Goal 4 (Week 2): Patient will demonstrate sustained attention to functional tasks for ~20 minutes with Min verbal cues for redirection. SLP Short Term Goal 5 (Week 2): Patient will attend to left field of enviornment during functional tasks with Min verbal cues. SLP Short Term Goal 6 (Week 2): Pt will use speech intelligibility strategies to increase intelligibility to 90% at the sentence level with supervision cues.  Skilled Therapeutic Interventions: Skilled treatment session focused on cognitive and dysphagia goals. Upon arrival, patient was tearful. Patient reported fatigue and mental exhaustion from constant headaches and feeling down about his current situation. SLP provided support. Patient agreeable to consume trials of thin liquids after oral care. Patient with intermittent overt s/s of aspiration, suspect due to decreased attention to task. Recommend continued trials with SLP. Patient missed remaining 30 minutes of session and left with alarm on, tv/lights off to reduce environmental stimulation and all  needs within reach. Continue with current plan of care.       Pain 9/10 headache, patient premedicated   Therapy/Group: Individual Therapy  Paul [name] 10/06/2019, 2:34 PM

## 2019-10-06 NOTE — Progress Notes (Signed)
Recreational Therapy Session Note  Patient Details  Name: Paul [name] MRN: [id number] Date of Birth: April 09, 1966 Today's Date: 10/06/2019 Time:  04-1041 Pain: no c/o Skilled Therapeutic Interventions/Progress Updates: Session focused on activity tolerance, w/c mobility, standing balance, visual scanning, problem solving and emotional support.  Pt propelled his w/c from his room to the therapy gym with supervision-min assist, min assist/cues needed to avoid objects on the left.  Once in the gym, pt stood to complete pipe tree.  Pt required mod assist for static standing, and max assist for dynamic balance while using RUE for pipe tree activity.  Pt with poor frustration tolerance stating "I can't do anything by myself."  Emotional support/encouragment provided.  Discussed the demand on his brain of completing dual task.  Had pt complete same pipe tree activity seated with supervision/set up assistance.   Therapy/Group: Co-Treatment   Paul [name] 10/06/2019, 12:57 PM

## 2019-10-06 NOTE — Progress Notes (Signed)
Nutrition Follow-up  DOCUMENTATION CODES:   Not applicable  INTERVENTION:   Continue snacks TID  Continue Magic cup TID with meals, each supplement provides 290 kcal and 9 grams of protein  Continue Hormel Shakes TID, each supplement provides 520 kcal and 22 grams of protein   If PO intake does not improve, consider Cortrak placement for nocturnal feeding to help pt meet needs.   NUTRITION DIAGNOSIS:   Inadequate oral intake related to dysphagia as evidenced by meal completion < 50%.  Ongoing.  GOAL:   Patient will meet greater than or equal to 90% of their needs  Not met.   MONITOR:   PO intake, Supplement acceptance, Diet advancement  REASON FOR ASSESSMENT:   Consult Diet education  ASSESSMENT:   Pt with a PMH of HTN, HLD, embolic stroke, tobacco and cannabis use who was admitted on 09/17/19 with right thalamic hemorrhage with IVH and he was transferred to [facility] for management. Pt now with improving mentation and diet advanced to dysphagia 2, nectar liquids. Pt with limitations due to left hemiplegia with left inattention, difficulty with multi-step commands but showing improvement in attention. Admitted to CIR on 2/27.  Pt providing no verbal responses to RD questions, but shakes his head "yes" when asked about his appetite improving and consumption of Hormel shakes. RD observed shakes in room, 50% consumed. RD encouraged pt to consume 100% of shakes to increase protein/calorie intake. Pt expressed understanding.   Discussed pt with RN. RN states pt does not like the food here or the Hormel shakes and often refuses to eat. Per RN, pt has a friend that comes in every day that brought him mashed potatoes and macaroni and cheese from [facility] that the pt really enjoyed and ate 100%. Discussed need for Cortrak placement if PO intake does not improve with RN. RN to encourage consumption of protein and Hormel shakes with pt.   PO intake: 10-50% x last 8 recorded meals (24% average  intake)   Medications reviewed and include: MVI, Senokot-S  Labs reviewed.   UOP: 1,240m x24 hours I/O: -4,[phone] admit  Diet Order:   Diet Order            DIET DYS 2 Room service appropriate? Yes; Fluid consistency: Nectar Thick  Diet effective now              EDUCATION NEEDS:   Education needs have been addressed  Skin:  Skin Assessment: Reviewed RN Assessment  Last BM:  10/02/19  Height:   Ht Readings from Last 1 Encounters:  09/25/19 6' (1.829 m)    Weight:   Wt Readings from Last 1 Encounters:  09/25/19 72.7 kg    BMI:  Body mass index is 21.74 kg/m.  Estimated Nutritional Needs:   Kcal:  2100-2300  Protein:  95-110 grams  Fluid:  2L/d    AmLarkin InaMS, RD, LDN RD pager number and weekend/on-call pager number located in [facility]

## 2019-10-07 ENCOUNTER — Encounter (HOSPITAL_COMMUNITY): Payer: Self-pay | Admitting: Psychology

## 2019-10-07 ENCOUNTER — Inpatient Hospital Stay (HOSPITAL_COMMUNITY): Payer: Self-pay | Admitting: Occupational Therapy

## 2019-10-07 ENCOUNTER — Inpatient Hospital Stay (HOSPITAL_COMMUNITY): Payer: Self-pay | Admitting: Physical Therapy

## 2019-10-07 ENCOUNTER — Inpatient Hospital Stay (HOSPITAL_COMMUNITY): Payer: Self-pay | Admitting: Speech Pathology

## 2019-10-07 DIAGNOSIS — F329 Major depressive disorder, single episode, unspecified: Secondary | ICD-10-CM

## 2019-10-07 MED ORDER — AMITRIPTYLINE HCL 25 MG PO TABS
25.0000 mg | ORAL_TABLET | Freq: Every day | ORAL | Status: DC
  Administered 2019-10-07 – 2019-10-09 (×3): 25 mg via ORAL
  Filled 2019-10-07 (×3): qty 1

## 2019-10-07 MED ORDER — DIVALPROEX SODIUM 125 MG PO DR TAB
125.0000 mg | DELAYED_RELEASE_TABLET | Freq: Two times a day (BID) | ORAL | Status: DC
  Administered 2019-10-07 – 2019-10-21 (×29): 125 mg via ORAL
  Filled 2019-10-07 (×30): qty 1

## 2019-10-07 NOTE — Progress Notes (Signed)
[place] PHYSICAL MEDICINE & REHABILITATION PROGRESS NOTE  Subjective/Complaints: [name] met with neuropsych this morning and discussed his feelings of worthlessness.  We are going down on the Amitriptyline today to decrease its sedative effects.  Will start Depakote for mood stabilization/headache.   ROS: + Headaches, back pain.  Objective:   No results found. No results for input(s): WBC, HGB, HCT, PLT in the last 72 hours. No results for input(s): NA, K, CL, CO2, GLUCOSE, BUN, CREATININE, CALCIUM in the last 72 hours.  Intake/Output Summary (Last 24 hours) at 10/07/2019 1043 Last data filed at 10/07/2019 0809 Gross per 24 hour  Intake 30 ml  Output 975 ml  Net -945 ml     Physical Exam: Vital Signs Blood pressure 126/78, pulse (!) 55, temperature (!) 97.4 F (36.3 C), resp. rate 16, height 6' (1.829 m), weight 72.7 kg, SpO2 100 %. Constitutional: No distress . Vital signs reviewed. Appears more alert this morning. Eating breakfast.  HENT: Normocephalic.  Atraumatic. Eyes: EOMI. No discharge. Cardiovascular: No JVD. Respiratory: Normal effort.  No stridor. GI: Non-distended. Skin: Warm and dry.  Intact. Psych: Normal mood.  Normal behavior. Musc: No edema in extremities.  No tenderness in extremities. Neurological: Alert Motor: LUE/LE: 0/5 proximal distal No increase in tone noted Left facial weakness  Assessment/Plan: 1. Functional deficits secondary to right thalamic hemorrhage which require 3+ hours per day of interdisciplinary therapy in a comprehensive inpatient rehab setting.  Physiatrist is providing close team supervision and 24 hour management of active medical problems listed below.  Physiatrist and rehab team continue to assess barriers to discharge/monitor patient progress toward functional and medical goals  Care Tool:  Bathing    Body parts bathed by patient: Chest, Abdomen, Front perineal area, Right upper leg, Left upper leg, Face   Body  parts bathed by helper: Buttocks, Right arm, Left arm, Right lower leg, Left lower leg     Bathing assist Assist Level: Maximal Assistance - Patient 24 - 49%     Upper Body Dressing/Undressing Upper body dressing   What is the patient wearing?: Pull over shirt    Upper body assist Assist Level: Maximal Assistance - Patient 25 - 49%    Lower Body Dressing/Undressing Lower body dressing      What is the patient wearing?: Underwear/pull up, Pants     Lower body assist Assist for lower body dressing: Maximal Assistance - Patient 25 - 49%     Toileting Toileting Toileting Activity did not occur (Clothing management and hygiene only): N/A (no void or bm)  Toileting assist Assist for toileting: Dependent - Patient 0%     Transfers Chair/bed transfer  Transfers assist     Chair/bed transfer assist level: Moderate Assistance - Patient 50 - 74%     Locomotion Ambulation   Ambulation assist   Ambulation activity did not occur: Safety/medical concerns(Increased R hip pain in standing, pt unable to tolerance standing)  Assist level: 2 helpers Assistive device: (3 muskateers) Max distance: 20 ft   Walk 10 feet activity   Assist  Walk 10 feet activity did not occur: Safety/medical concerns(Increased R hip pain in standing, pt unable to tolerance standing)  Assist level: 2 helpers Assistive device: (3 muskateers)   Walk 50 feet activity   Assist Walk 50 feet with 2 turns activity did not occur: Safety/medical concerns(Increased R hip pain in standing, pt unable to tolerance standing)         Walk 150 feet activity   Assist  Walk 150 feet activity did not occur: Safety/medical concerns(Increased R hip pain in standing, pt unable to tolerance standing)         Walk 10 feet on uneven surface  activity   Assist Walk 10 feet on uneven surfaces activity did not occur: Safety/medical concerns(Increased R hip pain in standing, pt unable to tolerance  standing)         Wheelchair     Assist Will patient use wheelchair at discharge?: Yes Type of Wheelchair: Manual    Wheelchair assist level: Supervision/Verbal cueing Max wheelchair distance: 75 ft    Wheelchair 50 feet with 2 turns activity    Assist    Wheelchair 50 feet with 2 turns activity did not occur: Safety/medical concerns(unable without skilled intervention)   Assist Level: Supervision/Verbal cueing   Wheelchair 150 feet activity     Assist  Wheelchair 150 feet activity did not occur: Safety/medical concerns(unable without skilled intervention)   Assist Level: Minimal Assistance - Patient > 75%   Blood pressure 126/78, pulse (!) 55, temperature (!) 97.4 F (36.3 C), resp. rate 16, height 6' (1.829 m), weight 72.7 kg, SpO2 100 %.  Medical Problem List and Plan: 1.Left hemiparesis and visual-spatial deficitssecondary to right thalamic hemorrhage with intraventricular extension  Continue CIR  -WHO,PRAFO for LUE and LLE  -Team conference 3/9: CG to MinA goals. DC date of 3/26. Can initiate respiratory therapy. Brother will be involved in caregiver training and can provide 24/7 supervision upon discharge.  2. Antithrombotics:  -DVT/anticoagulation:Mechanical:Sequential compression devices, below kneeBilateral lower extremities   Dopplers negative for DVT -antiplatelet therapy: N/a 3.Headaches/Pain Management:Fioricet or ultram prnhaven't helped a great deal.  Topamax increased to 36m bid on 3/1  Amitriptyline increased to 50qHS for both headache and insomnia on 3/7.   Will increase Topamax to 711mBID for better control of headaches.   K pad ordered for low back pain  3/12: started Depakote for mood stabilization/ headache. Will decrease Amitriptyline given increased lethargy.  4. Mood:LCSW to follow for evaluation and support. -antipsychotic agents: N/A  -Will request neuropsych input, gets  frustrated easily and says he is "pitiful" and doing horribly. Neuropsych eval scheduled for Friday. I counseled patient today that he has been working very hard and continuing to show improvements and he was very apPatent attorneyf comments.  5. Neuropsych: This patientiscapable of making decisions on hisown behalf. Appreciate neuropsych eval today; patient discussed his feelings of worthlessness.  6. Skin/Wound Care:Routine pressure relief measures. 7. Fluids/Electrolytes/Nutrition:Monitor I/Os. Offer nectar liquids between meals to maintain adequate hydration. 8. HTN: Monitor BP tid-continue Hydralazine, Cozaar, Coreg, Catapres and Norvasc. TItrateas indicated--SBP goal<140.  Controlled on 3/8, 3/10-actually has been soft and bradycardic. Will decrease Amlodipine to 53m4maily.  3/11: Will decrease Amlodipine further to 2.53mg12m3/12: will stop amlodipine.  9. Prediabetes: Hgb A1C- 6.2--was 5.7 eight months ago. Will have RD educate on CM diet--- monitor BS ac/hs with SSI for now.   Mildly elevated on 3/8, 3/10. Continue to monitor.   3/11: has been well controlled; now off daily CBGs and SSI 10. Tobacco/Cannabis use: Reports that he plans on quitting.  11. Dyslipidemia: Statin on hold due to ICH--resume at discharge. 12.  Post stroke dysphagia: doesn't like consistency but is tolerating due to nectars so far with supervision 13.  AKI  Creatinine 1.35 on 3/8  Encourage fluids  IVF to adjust x3 night started on 3/8, echo reviewed with EF of 55-60%  See #12 15. Urinary retention: cathing every 8  hours since volumes are low.  16. Disposition: Brother has been present and is receiving education  LOS: 13 days A FACE TO FACE EVALUATION WAS PERFORMED  Martha Clan P  10/07/2019, 10:43 AM

## 2019-10-07 NOTE — Progress Notes (Signed)
Occupational Therapy Session Note  Patient Details  Name: Paul [name] MRN: [id number] Date of Birth: [birth year]  Today's Date: 10/07/2019 OT Individual Time: 1050-1200 OT Individual Time Calculation (min): 70 min   Short Term Goals: Week 2:  OT Short Term Goal 1 (Week 2): Pt will complete shower transfer with 1 assist OT Short Term Goal 2 (Week 2): Pt will maintain sitting balance at EOB with no more than Mod A for balance within BADL task OT Short Term Goal 3 (Week 2): Pt will maintain standing balance with mod A within BADL task  Skilled Therapeutic Interventions/Progress Updates:    Pt greeted semi-reclined in bed asleep, but easy to wake. Pt reported headache and fatigue from not sleeping well last night. Pt declined any bathing/dressing, but was agreeable to work on L UE at bed level. Applied 1:1 NMES to CH1 supraspinatus and middle deltoid to help approximate shoulder joint.  Ratio 1:1 Rate 35 pps Waveform- Asymmetric Ramp 1.0 Pulse 300 CH1 Intensity- 15  Duration -  15  OT thejn placed kinesiotape to L shoulder for shoulder support w/ subluxation. After K-tape applied, pt agreeable to get up to wc for lunch. Mod A for bed mobility, then min A for squat-pivot to wc on R side. Pt left sated in wc with L UE supported on 1/;2 lap tray, alarm belt on, and call bell in reach. OT placed SAEBO e-stim on wrist extensors. SAEBO left on for 60 minutes. OT returned to remove SAEBO with skin intact and no adverse reactions.  Sa[place]o Stim One 330 pulse width 35 Hz pulse rate On 8 sec/ off 8 sec Ramp up/ down 2 sec Symmetrical Biphasic wave form  Max intensity 165mA at 500 Ohm load    Therapy Documentation Precautions:  Precautions Precautions: Fall Precaution Comments: left sided weakness and left lateral lean, SBP <160 Restrictions Weight Bearing Restrictions: No Other Position/Activity Restrictions: L PRAFO and WHO donned at rest Pain: Pain Assessment Pain Scale: 0-10 Pain  Score: 0-No pain   Therapy/Group: Individual Therapy  Valma Cava 10/07/2019, 11:28 AM
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
## 2019-10-07 NOTE — Progress Notes (Signed)
Physical Therapy Weekly Progress Note  Patient Details  Name: Paul [name] MRN: [id number] Date of Birth: 09-14-1965  Beginning of progress report period: September 30, 2019 End of progress report period: October 07, 2019  Today's Date: 10/07/2019 PT Individual Time: 3846-6599 and 3570-1779 PT Individual Time Calculation (min): 59 min and 23 min  Patient has met 1 of 3 short term goals.  Pt is making slow progress towards LTG's with focus of therapy on increasing independence with all functional mobility tasks, L NMR, sitting/standing balance, postural control and cognitive remediation. Pt continues to require up to max assist for transfers, min assist for w/c mobility & bed mobility, and use of lite gait or 3 muskateers to attempt gait training. Pt continues to demonstrate decreased attention to L side & impaired safety awareness. At this point it is anticipated pt will d/c home at a w/c level, with pt & brother Leroy Sea) made aware & [name] given home measurement sheet.   Patient continues to demonstrate the following deficits muscle weakness, decreased cardiorespiratoy endurance, motor apraxia, decreased coordination and decreased motor planning, decreased visual perceptual skills, decreased attention to left and decreased motor planning, decreased attention, decreased awareness, decreased problem solving, decreased safety awareness, decreased memory and delayed processing, and decreased sitting balance, decreased standing balance, decreased postural control, decreased balance strategies and L hemiparesis and therefore will continue to benefit from skilled PT intervention to increase functional independence with mobility.  Patient is slowly progressing towards LTGs.  Plan of care revisions: goals have been downgraded to reflect anticipation of pt discharging at w/c level.  PT Short Term Goals Week 2:  PT Short Term Goal 1 (Week 2): Pt will propel w/c 75 ft with mod assist. PT Short Term Goal 1 - Progress  (Week 2): Met PT Short Term Goal 2 (Week 2): Pt will consistently complete transfers to L with mod assist. PT Short Term Goal 2 - Progress (Week 2): Progressing toward goal PT Short Term Goal 3 (Week 2): Pt will demonstrate standing balance with mod assist x 3 minutes. PT Short Term Goal 3 - Progress (Week 2): Progressing toward goal Week 3:  PT Short Term Goal 1 (Week 3): Pt will consistently complete w/c<>bed transfers to L & R with mod assist with min cuing for safety awareness. PT Short Term Goal 2 (Week 3): Pt will complete car transfer with max assist +1. PT Short Term Goal 3 (Week 3): Pt will propel w/c x 50 ft with supervision.  Skilled Therapeutic Interventions/Progress Updates:  Treatment 1: Pt received in bed reporting unrated L hip pain & throbbing HA but states he's premedicated. Therapist dons shoes total assist. Supine>sit with bed flat, rails and min assist for LLE and steadying at trunk. Stand pivot transfers throughout session to R with min assist, L x 1 time with mod assist with cuing for increased control of movement & therapist facilitating pivot portion. Educated pt on w/c set up & brake management in preparation for w/c<>bed transfers with pt requiring cuing to attend to L brake. W/c mobility between cones with min cuing and supervision overall to attend to cones on L & decrease speed 2/2 impulsivity. Pt retrieves cones with instructional cuing to park to L of cone so he can retreive them with RUE as pt initially attempts to pick up cones with flaccid LUE. Dynamic sitting balance on EOM with pt engaging in hitting beach ball back to tech with min assist for sitting balance and cuing to right himself with core muscles  vs pulling with RUE. Pt reports core muscle soreness during activity & rest breaks provided PRN. Pt then reports feeling like he's "going to pass out" so assisted to supine. Pt also reports he feels his left side is more weak than before. BP = 129/75 mmHg (LUE, supine),  HR = 53 bpm with pt then clarifying, reporting he's just so fatigued. Assisted pt to sitting then back to w/c. Returned pt to room & assisted back to bed for time management. Nurse notified of pt's c/o at end of session & pt's vitals. Pt left in bed with alarm set, call bell & all needs in reach. Provided thickened liquid to pt upon request during session with cuing for small sips.   Treatment 2: Pt received in bed reporting 6/10 HA but nurse reports pt is premedicated & pt does not recall this. Pt agreeable to bed level exercises & performs BLE bridging with therapist providing total assist for maintaining LLE alignment & positioning, 3 sets x 10 reps. Also attempted LLE single leg bridging but no activation noted in L glute/hamstring. Pt very frustrated with LLE "not working". Therapist provided encouragement & played music to increase pt's mood. Therapist performed LLE ankle dorsiflexion stretches & donned PRAFO & hand splint. Pt left in bed with alarm set & all needs in reach.  Therapy Documentation Precautions:  Precautions Precautions: Fall Precaution Comments: left sided weakness and left lateral lean, SBP <160 Restrictions Weight Bearing Restrictions: No Other Position/Activity Restrictions: L PRAFO and WHO donned at rest  Therapy/Group: Individual Therapy  [name] 10/07/2019, 3:30 PM

## 2019-10-07 NOTE — Plan of Care (Signed)
Goals downgraded 2/2 slow progress or discharged 2/2 N/A at this time.  Problem: RH Balance Goal: LTG Patient will maintain dynamic sitting balance (PT) Description: LTG:  Patient will maintain dynamic sitting balance with assistance during mobility activities (PT) Flowsheets (Taken 10/07/2019 0826) LTG: Pt will maintain dynamic sitting balance during mobility activities with:: (downgrade 2/2 slow progress) Contact Guard/Touching assist Note: downgrade 2/2 slow progress Goal: LTG Patient will maintain dynamic standing balance (PT) Description: LTG:  Patient will maintain dynamic standing balance with assistance during mobility activities (PT) Flowsheets (Taken 10/07/2019 0826) LTG: Pt will maintain dynamic standing balance during mobility activities with:: (downgrade 2/2 slow progress) Moderate Assistance - Patient 50 - 74% Note: downgrade 2/2 slow progress   Problem: Sit to Stand Goal: LTG:  Patient will perform sit to stand with assistance level (PT) Description: LTG:  Patient will perform sit to stand with assistance level (PT) Flowsheets (Taken 10/07/2019 0826) LTG: PT will perform sit to stand in preparation for functional mobility with assistance level: (downgrade 2/2 slow progress) Minimal Assistance - Patient > 75% Note: downgrade 2/2 slow progress   Problem: RH Bed to Chair Transfers Goal: LTG Patient will perform bed/chair transfers w/assist (PT) Description: LTG: Patient will perform bed to chair transfers with assistance (PT). Flowsheets (Taken 10/07/2019 0826) LTG: Pt will perform Bed to Chair Transfers with assistance level: (downgrade 2/2 slow progress) Minimal Assistance - Patient > 75% Note: downgrade 2/2 slow progress   Problem: RH Car Transfers Goal: LTG Patient will perform car transfers with assist (PT) Description: LTG: Patient will perform car transfers with assistance (PT). Flowsheets (Taken 10/07/2019 0826) LTG: Pt will perform car transfers with assist::  (downgrade 2/2 slow progress) Moderate Assistance - Patient 50 - 74% Note: downgrade 2/2 slow progress   Problem: RH Furniture Transfers Goal: LTG Patient will perform furniture transfers w/assist (OT/PT) Description: LTG: Patient will perform furniture transfers  with assistance (OT/PT). Outcome: Not Applicable Flowsheets (Taken 10/07/2019 0826) LTG: Pt will perform furniture transfers with assist:: (d/c goal at this time) -- Note: D/c goal at this time   Problem: RH Ambulation Goal: LTG Patient will ambulate in controlled environment (PT) Description: LTG: Patient will ambulate in a controlled environment, # of feet with assistance (PT). Flowsheets (Taken 10/07/2019 0826) LTG: Pt will ambulate in controlled environ  assist needed:: (downgrade 2/2 slow progress) Maximal Assistance - Patient 25 - 49% LTG: Ambulation distance in controlled environment: 25 ft Note: downgrade 2/2 slow progress Goal: LTG Patient will ambulate in home environment (PT) Description: LTG: Patient will ambulate in home environment, # of feet with assistance (PT). Outcome: Not Applicable Flowsheets (Taken 10/07/2019 0826) LTG: Pt will ambulate in home environ  assist needed:: (d/c goal - not applicable at this time) -- Note: d/c goal - not applicable at this time Goal: LTG Patient will ambulate in community environment (PT) Description: LTG: Patient will ambulate in community environment, # of feet with assistance (PT). Outcome: Not Applicable Flowsheets (Taken 10/07/2019 0826) LTG: Pt will ambulate in community environ  assist needed:: (d/c goal - not applicable at this time) -- Note: d/c goal - not applicable at this time   Problem: RH Wheelchair Mobility Goal: LTG Patient will propel w/c in controlled environment (PT) Description: LTG: Patient will propel wheelchair in controlled environment, # of feet with assist (PT) Flowsheets (Taken 10/07/2019 0826) LTG: Pt will propel w/c in controlled environ  assist  needed:: (downgrade 2/2 slow progress) Supervision/Verbal cueing LTG: Propel w/c distance in controlled environment: 150 ft  Note: downgrade 2/2 slow progress Goal: LTG Patient will propel w/c in home environment (PT) Description: LTG: Patient will propel wheelchair in home environment, # of feet with assistance (PT). Flowsheets (Taken 10/07/2019 0826) LTG: Pt will propel w/c in home environ  assist needed:: (downgrade 2/2 slow progress) Minimal Assistance - Patient > 75% LTG: Propel w/c distance in home environment: 50 ft Note: downgrade 2/2 slow progress Goal: LTG Patient will propel w/c in community environment (PT) Description: LTG: Patient will propel wheelchair in community environment, # of feet with assist (PT) Outcome: Not Applicable Flowsheets (Taken 10/07/2019 0826) LTG: Pt will propel w/c in community environ  assist needed:: (d/c goal - not applicable at this time) -- Note: D/c goal - not applicable at this time   Problem: RH Stairs Goal: LTG Patient will ambulate up and down stairs w/assist (PT) Description: LTG: Patient will ambulate up and down # of stairs with assistance (PT) Outcome: Not Applicable Flowsheets (Taken 10/07/2019 0826) LTG: Pt will ambulate up/down stairs assist needed:: (d/c goal - not applicable at this time) -- Note: D/c goal - not applicable at this time

## 2019-10-07 NOTE — Progress Notes (Signed)
Speech Language Pathology Daily Session Note  Patient Details  Name: Kerem Domanico MRN: [id number] Date of Birth: 12-Jul-1966  Today's Date: 10/07/2019 SLP Individual Time: 1255-1340 SLP Individual Time Calculation (min): 45 min  Short Term Goals: Week 2: SLP Short Term Goal 1 (Week 2): Pt will consume current diet with minmal overt s/sx aspiration and demonstrate efficient mastication and oral clearance with Supervision A verbal cues for use of swallow strategies. SLP Short Term Goal 2 (Week 2): Pt will consume trials of thin liquids over 3 sessions with minimal overt s/sx aspiration with Min verbal cues to assess readiness for repeat MBSS. SLP Short Term Goal 3 (Week 2): Pt will demonstrate ability to problem solve functional, familiar and basic tasks with Min A verbal/visual cues. SLP Short Term Goal 4 (Week 2): Patient will demonstrate sustained attention to functional tasks for ~20 minutes with Min verbal cues for redirection. SLP Short Term Goal 5 (Week 2): Patient will attend to left field of enviornment during functional tasks with Min verbal cues. SLP Short Term Goal 6 (Week 2): Pt will use speech intelligibility strategies to increase intelligibility to 90% at the sentence level with supervision cues.  Skilled Therapeutic Interventions: Skilled treatment session focused on cognitive and dysphagia goals. Upon arrival, patient requested a nurse. When asked why he reported it was because he was not sure where he was or why he was here. SLP provided total A for orientation to time, place and situation. SLP facilitated session by providing Mod A verbal cues for selective attention to self-feeding in a mildly distracting environment. Patient consumed current diet of Dys. 2 textures with nectar-thick liquids without overt s/s of aspiration but demonstrated prolonged AP transit and left anterior spillage that required Min verbal cues to self-monitor and correct. Recommend patient continue current diet.  Patient was transferred back to be per his request via the [facility] with Mod verbal cues for safety with task. Patient left supine in bed with alarm on and all needs within reach. Continue with current plan of care.      Pain No/Denies Pain   Therapy/Group: Individual Therapy  Stephana Morell 10/07/2019, 1:41 PM

## 2019-10-07 NOTE — Plan of Care (Signed)
Problem: Consults Goal: RH GENERAL PATIENT EDUCATION Description: See Patient Education module for education specifics. Outcome: Progressing Goal: Skin Care Protocol Initiated - if Braden Score 18 or less Description: If consults are not indicated, leave blank or document N/A Outcome: Progressing Goal: Nutrition Consult-if indicated Outcome: Progressing Goal: Diabetes Guidelines if Diabetic/Glucose > 140 Description: If diabetic or lab glucose is > 140 mg/dl - Initiate Diabetes/Hyperglycemia Guidelines & Document Interventions  Outcome: Progressing   Problem: RH BOWEL ELIMINATION Goal: RH STG MANAGE BOWEL WITH ASSISTANCE Description: STG Manage Bowel with min Assistance. Outcome: Progressing   Problem: RH BLADDER ELIMINATION Goal: RH STG MANAGE BLADDER WITH ASSISTANCE Description: STG Manage Bladder With min Assistance Outcome: Progressing Goal: RH STG MANAGE BLADDER WITH EQUIPMENT WITH ASSISTANCE Description: STG Manage Bladder With Equipment With min Assistance Outcome: Progressing   Problem: RH SKIN INTEGRITY Goal: RH STG MAINTAIN SKIN INTEGRITY WITH ASSISTANCE Description: STG Maintain Skin Integrity With min Assistance. Outcome: Progressing   Problem: RH SAFETY Goal: RH STG ADHERE TO SAFETY PRECAUTIONS W/ASSISTANCE/DEVICE Description: STG Adhere to Safety Precautions With cues and reminders Outcome: Progressing Goal: RH STG DECREASED RISK OF FALL WITH ASSISTANCE Description: STG Decreased Risk of Fall With cues and reminders Outcome: Progressing   Problem: RH PAIN MANAGEMENT Goal: RH STG PAIN MANAGED AT OR BELOW PT'S PAIN GOAL Description: Pain level less than 4 on scale of 0-10 Outcome: Progressing   Problem: RH KNOWLEDGE DEFICIT GENERAL Goal: RH STG INCREASE KNOWLEDGE OF SELF CARE AFTER HOSPITALIZATION Description: Pt will be able to adhere to safety precautions and demonstrate understanding of precautions to take to prevent injury with min assist.  Outcome:  Progressing   Problem: Consults Goal: RH STROKE PATIENT EDUCATION Description: See Patient Education module for education specifics  Outcome: Progressing Goal: Nutrition Consult-if indicated Outcome: Progressing Goal: Diabetes Guidelines if Diabetic/Glucose > 140 Description: If diabetic or lab glucose is > 140 mg/dl - Initiate Diabetes/Hyperglycemia Guidelines & Document Interventions  Outcome: Progressing   Problem: RH BOWEL ELIMINATION Goal: RH STG MANAGE BOWEL WITH ASSISTANCE Description: STG Manage Bowel with Assistance. Outcome: Progressing Goal: RH STG MANAGE BOWEL W/MEDICATION W/ASSISTANCE Description: STG Manage Bowel with Medication with Assistance. Outcome: Progressing   Problem: RH BLADDER ELIMINATION Goal: RH STG MANAGE BLADDER WITH ASSISTANCE Description: STG Manage Bladder With Assistance Outcome: Progressing Goal: RH STG MANAGE BLADDER WITH MEDICATION WITH ASSISTANCE Description: STG Manage Bladder With Medication With Assistance. Outcome: Progressing Goal: RH STG MANAGE BLADDER WITH EQUIPMENT WITH ASSISTANCE Description: STG Manage Bladder With Equipment With Assistance Outcome: Progressing   Problem: RH SKIN INTEGRITY Goal: RH STG SKIN FREE OF INFECTION/BREAKDOWN Outcome: Progressing Goal: RH STG MAINTAIN SKIN INTEGRITY WITH ASSISTANCE Description: STG Maintain Skin Integrity With Assistance. Outcome: Progressing Goal: RH STG ABLE TO PERFORM INCISION/WOUND CARE W/ASSISTANCE Description: STG Able To Perform Incision/Wound Care With Assistance. Outcome: Progressing   Problem: RH SAFETY Goal: RH STG ADHERE TO SAFETY PRECAUTIONS W/ASSISTANCE/DEVICE Description: STG Adhere to Safety Precautions With Assistance/Device. Outcome: Progressing Goal: RH STG DECREASED RISK OF FALL WITH ASSISTANCE Description: STG Decreased Risk of Fall With Assistance. Outcome: Progressing   Problem: RH COGNITION-NURSING Goal: RH STG USES MEMORY AIDS/STRATEGIES W/ASSIST TO  PROBLEM SOLVE Description: STG Uses Memory Aids/Strategies With Assistance to Problem Solve. Outcome: Progressing Goal: RH STG ANTICIPATES NEEDS/CALLS FOR ASSIST W/ASSIST/CUES Description: STG Anticipates Needs/Calls for Assist With Assistance/Cues. Outcome: Progressing   Problem: RH PAIN MANAGEMENT Goal: RH STG PAIN MANAGED AT OR BELOW PT'S PAIN GOAL Outcome: Progressing   Problem: RH KNOWLEDGE DEFICIT Goal: RH STG  INCREASE KNOWLEDGE OF DIABETES Outcome: Progressing Goal: RH STG INCREASE KNOWLEDGE OF HYPERTENSION Outcome: Progressing Goal: RH STG INCREASE KNOWLEDGE OF DYSPHAGIA/FLUID INTAKE Outcome: Progressing Goal: RH STG INCREASE KNOWLEGDE OF HYPERLIPIDEMIA Outcome: Progressing Goal: RH STG INCREASE KNOWLEDGE OF STROKE PROPHYLAXIS Outcome: Progressing

## 2019-10-08 ENCOUNTER — Inpatient Hospital Stay (HOSPITAL_COMMUNITY): Payer: Self-pay | Admitting: Occupational Therapy

## 2019-10-08 MED ORDER — LOSARTAN POTASSIUM 50 MG PO TABS
25.0000 mg | ORAL_TABLET | Freq: Two times a day (BID) | ORAL | Status: DC
  Administered 2019-10-08 – 2019-10-21 (×26): 25 mg via ORAL
  Filled 2019-10-08 (×26): qty 1

## 2019-10-08 NOTE — Progress Notes (Signed)
[facility] PHYSICAL MEDICINE & REHABILITATION PROGRESS NOTE  Subjective/Complaints:   Pt reports no issues- slept "SO-SO".  Had no specific complaints this AM- yesterday c/o HA and back pain.    ROS:  Objective:   No results found. No results for input(s): WBC, HGB, HCT, PLT in the last 72 hours. No results for input(s): NA, K, CL, CO2, GLUCOSE, BUN, CREATININE, CALCIUM in the last 72 hours.  Intake/Output Summary (Last 24 hours) at 10/08/2019 1617 Last data filed at 10/08/2019 1300 Gross per 24 hour  Intake 175 ml  Output 1000 ml  Net -825 ml     Physical Exam: Vital Signs Blood pressure 111/71, pulse (!) 52, temperature 98.4 F (36.9 C), temperature source Oral, resp. rate 16, height 6' (1.829 m), weight 76 kg, SpO2 98 %. Constitutional: sitting listlessly in bed, cordial, NAD HENT: Normocephalic.  Atraumatic. Eyes: conjugate gaze Cardiovascular: bradycardic regular rhythm Respiratory: CTA B/L- no accessory muscle use GI: Non-distended. Soft, NT, hypoactive BS Skin: Warm and dry.  Intact. Psych: flat affect. Musc: No edema in extremities.  No tenderness in extremities. Neurological: Alert Motor: LUE/LE: 0/5 proximal distal No increase in tone noted Left facial weakness  Assessment/Plan: 1. Functional deficits secondary to right thalamic hemorrhage which require 3+ hours per day of interdisciplinary therapy in a comprehensive inpatient rehab setting.  Physiatrist is providing close team supervision and 24 hour management of active medical problems listed below.  Physiatrist and rehab team continue to assess barriers to discharge/monitor patient progress toward functional and medical goals  Care Tool:  Bathing    Body parts bathed by patient: Chest, Abdomen, Front perineal area, Right upper leg, Left upper leg, Face   Body parts bathed by helper: Buttocks, Right arm, Left arm, Right lower leg, Left lower leg     Bathing assist Assist Level: Maximal Assistance  - Patient 24 - 49%     Upper Body Dressing/Undressing Upper body dressing   What is the patient wearing?: Pull over shirt    Upper body assist Assist Level: Maximal Assistance - Patient 25 - 49%    Lower Body Dressing/Undressing Lower body dressing      What is the patient wearing?: Underwear/pull up, Pants     Lower body assist Assist for lower body dressing: Maximal Assistance - Patient 25 - 49%     Toileting Toileting Toileting Activity did not occur (Clothing management and hygiene only): N/A (no void or bm)  Toileting assist Assist for toileting: Dependent - Patient 0%     Transfers Chair/bed transfer  Transfers assist     Chair/bed transfer assist level: Moderate Assistance - Patient 50 - 74%     Locomotion Ambulation   Ambulation assist   Ambulation activity did not occur: Safety/medical concerns(Increased R hip pain in standing, pt unable to tolerance standing)  Assist level: 2 helpers Assistive device: (3 muskateers) Max distance: 20 ft   Walk 10 feet activity   Assist  Walk 10 feet activity did not occur: Safety/medical concerns(Increased R hip pain in standing, pt unable to tolerance standing)  Assist level: 2 helpers Assistive device: (3 muskateers)   Walk 50 feet activity   Assist Walk 50 feet with 2 turns activity did not occur: Safety/medical concerns(Increased R hip pain in standing, pt unable to tolerance standing)         Walk 150 feet activity   Assist Walk 150 feet activity did not occur: Safety/medical concerns(Increased R hip pain in standing, pt unable to tolerance standing)  Walk 10 feet on uneven surface  activity   Assist Walk 10 feet on uneven surfaces activity did not occur: Safety/medical concerns(Increased R hip pain in standing, pt unable to tolerance standing)         Wheelchair     Assist Will patient use wheelchair at discharge?: Yes Type of Wheelchair: Manual    Wheelchair assist  level: Supervision/Verbal cueing Max wheelchair distance: 75 ft    Wheelchair 50 feet with 2 turns activity    Assist    Wheelchair 50 feet with 2 turns activity did not occur: Safety/medical concerns(unable without skilled intervention)   Assist Level: Supervision/Verbal cueing   Wheelchair 150 feet activity     Assist  Wheelchair 150 feet activity did not occur: Safety/medical concerns(unable without skilled intervention)   Assist Level: Minimal Assistance - Patient > 75%   Blood pressure 111/71, pulse (!) 52, temperature 98.4 F (36.9 C), temperature source Oral, resp. rate 16, height 6' (1.829 m), weight 76 kg, SpO2 98 %.  Medical Problem List and Plan: 1.Left hemiparesis and visual-spatial deficitssecondary to right thalamic hemorrhage with intraventricular extension  Continue CIR  -WHO,PRAFO for LUE and LLE  -Team conference 3/9: CG to MinA goals. DC date of 3/26. Can initiate respiratory therapy. Brother will be involved in caregiver training and can provide 24/7 supervision upon discharge.  2. Antithrombotics:  -DVT/anticoagulation:Mechanical:Sequential compression devices, below kneeBilateral lower extremities   Dopplers negative for DVT -antiplatelet therapy: N/a 3.Headaches/Pain Management:Fioricet or ultram pr[place]'t helped a great deal.  Topamax increased to 50mg  bid on 3/1  Amitriptyline increased to 50qHS for both headache and insomnia on 3/7.   Will increase Topamax to 75mg  BID for better control of headaches.   K pad ordered for low back pain  3/12: started Depakote for mood stabilization/ headache. Will decrease Amitriptyline given increased lethargy.  4. Mood:LCSW to follow for evaluation and support. -antipsychotic agents: N/A  -Will request neuropsych input, gets frustrated easily and says he is "pitiful" and doing horribly. Neuropsych eval scheduled for Friday. I counseled patient today that he has  been working very hard and continuing to show improvements and he was very Patent attorney of comments.  5. Neuropsych: This patientiscapable of making decisions on hisown behalf. Appreciate neuropsych eval today; patient discussed his feelings of worthlessness.  6. Skin/Wound Care:Routine pressure relief measures. 7. Fluids/Electrolytes/Nutrition:Monitor I/Os. Offer nectar liquids between meals to maintain adequate hydration. 8. HTN: Monitor BP tid-continue Hydralazine, Cozaar, Coreg, Catapres and Norvasc. TItrateas indicated--SBP goal<140.  Controlled on 3/8, 3/10-actually has been soft and bradycardic. Will decrease Amlodipine to 5mg  daily.  3/11: Will decrease Amlodipine further to 2.5mg .  3/12: will stop amlodipine.  3/13- bradycardic- decrease losartan to 25 mg BID and monitor- BP soft as wel  9. Prediabetes: Hgb A1C- 6.2--was 5.7 eight months ago. Will have RD educate on CM diet--- monitor BS ac/hs with SSI for now.   Mildly elevated on 3/8, 3/10. Continue to monitor.   3/11: has been well controlled; now off daily CBGs and SSI 10. Tobacco/Cannabis use: Reports that he plans on quitting.  11. Dyslipidemia: Statin on hold due to ICH--resume at discharge. 12.  Post stroke dysphagia: doesn't like consistency but is tolerating due to nectars so far with supervision 13.  AKI  Creatinine 1.35 on 3/8  Encourage fluids  IVF to adjust x3 night started on 3/8, echo reviewed with EF of 55-60%  See #12 15. Urinary retention: cathing every 8 hours since volumes are low.  3/13- in/out caths- unable  to void- con't in/out caths- make sure family has education on this as well.   16. Disposition: Brother has been present and is receiving education  LOS: 14 days A FACE TO FACE EVALUATION WAS PERFORMED  Paul [name] 10/08/2019, 4:17 PM

## 2019-10-08 NOTE — Plan of Care (Signed)
Problem: Consults Goal: RH GENERAL PATIENT EDUCATION Description: See Patient Education module for education specifics. Outcome: Progressing Goal: Skin Care Protocol Initiated - if Braden Score 18 or less Description: If consults are not indicated, leave blank or document N/A Outcome: Progressing Goal: Nutrition Consult-if indicated Outcome: Progressing Goal: Diabetes Guidelines if Diabetic/Glucose > 140 Description: If diabetic or lab glucose is > 140 mg/dl - Initiate Diabetes/Hyperglycemia Guidelines & Document Interventions  Outcome: Progressing   Problem: RH BOWEL ELIMINATION Goal: RH STG MANAGE BOWEL WITH ASSISTANCE Description: STG Manage Bowel with min Assistance. Outcome: Progressing   Problem: RH BLADDER ELIMINATION Goal: RH STG MANAGE BLADDER WITH ASSISTANCE Description: STG Manage Bladder With min Assistance Outcome: Progressing Goal: RH STG MANAGE BLADDER WITH EQUIPMENT WITH ASSISTANCE Description: STG Manage Bladder With Equipment With min Assistance Outcome: Progressing   Problem: RH SKIN INTEGRITY Goal: RH STG MAINTAIN SKIN INTEGRITY WITH ASSISTANCE Description: STG Maintain Skin Integrity With min Assistance. Outcome: Progressing   Problem: RH SAFETY Goal: RH STG ADHERE TO SAFETY PRECAUTIONS W/ASSISTANCE/DEVICE Description: STG Adhere to Safety Precautions With cues and reminders Outcome: Progressing Goal: RH STG DECREASED RISK OF FALL WITH ASSISTANCE Description: STG Decreased Risk of Fall With cues and reminders Outcome: Progressing   Problem: RH PAIN MANAGEMENT Goal: RH STG PAIN MANAGED AT OR BELOW PT'S PAIN GOAL Description: Pain level less than 4 on scale of 0-10 Outcome: Progressing   Problem: RH KNOWLEDGE DEFICIT GENERAL Goal: RH STG INCREASE KNOWLEDGE OF SELF CARE AFTER HOSPITALIZATION Description: Pt will be able to adhere to safety precautions and demonstrate understanding of precautions to take to prevent injury with min assist.  Outcome:  Progressing   Problem: Consults Goal: RH STROKE PATIENT EDUCATION Description: See Patient Education module for education specifics  Outcome: Progressing Goal: Nutrition Consult-if indicated Outcome: Progressing Goal: Diabetes Guidelines if Diabetic/Glucose > 140 Description: If diabetic or lab glucose is > 140 mg/dl - Initiate Diabetes/Hyperglycemia Guidelines & Document Interventions  Outcome: Progressing   Problem: RH BOWEL ELIMINATION Goal: RH STG MANAGE BOWEL WITH ASSISTANCE Description: STG Manage Bowel with Assistance. Outcome: Progressing Goal: RH STG MANAGE BOWEL W/MEDICATION W/ASSISTANCE Description: STG Manage Bowel with Medication with Assistance. Outcome: Progressing   Problem: RH BLADDER ELIMINATION Goal: RH STG MANAGE BLADDER WITH ASSISTANCE Description: STG Manage Bladder With Assistance Outcome: Progressing Goal: RH STG MANAGE BLADDER WITH MEDICATION WITH ASSISTANCE Description: STG Manage Bladder With Medication With Assistance. Outcome: Progressing Goal: RH STG MANAGE BLADDER WITH EQUIPMENT WITH ASSISTANCE Description: STG Manage Bladder With Equipment With Assistance Outcome: Progressing   Problem: RH SKIN INTEGRITY Goal: RH STG SKIN FREE OF INFECTION/BREAKDOWN Outcome: Progressing Goal: RH STG MAINTAIN SKIN INTEGRITY WITH ASSISTANCE Description: STG Maintain Skin Integrity With Assistance. Outcome: Progressing Goal: RH STG ABLE TO PERFORM INCISION/WOUND CARE W/ASSISTANCE Description: STG Able To Perform Incision/Wound Care With Assistance. Outcome: Progressing   Problem: RH SAFETY Goal: RH STG ADHERE TO SAFETY PRECAUTIONS W/ASSISTANCE/DEVICE Description: STG Adhere to Safety Precautions With Assistance/Device. Outcome: Progressing Goal: RH STG DECREASED RISK OF FALL WITH ASSISTANCE Description: STG Decreased Risk of Fall With Assistance. Outcome: Progressing   Problem: RH COGNITION-NURSING Goal: RH STG USES MEMORY AIDS/STRATEGIES W/ASSIST TO  PROBLEM SOLVE Description: STG Uses Memory Aids/Strategies With Assistance to Problem Solve. Outcome: Progressing Goal: RH STG ANTICIPATES NEEDS/CALLS FOR ASSIST W/ASSIST/CUES Description: STG Anticipates Needs/Calls for Assist With Assistance/Cues. Outcome: Progressing   Problem: RH PAIN MANAGEMENT Goal: RH STG PAIN MANAGED AT OR BELOW PT'S PAIN GOAL Outcome: Progressing   Problem: RH KNOWLEDGE DEFICIT Goal: RH STG  INCREASE KNOWLEDGE OF DIABETES Outcome: Progressing Goal: RH STG INCREASE KNOWLEDGE OF HYPERTENSION Outcome: Progressing Goal: RH STG INCREASE KNOWLEDGE OF DYSPHAGIA/FLUID INTAKE Outcome: Progressing Goal: RH STG INCREASE KNOWLEGDE OF HYPERLIPIDEMIA Outcome: Progressing Goal: RH STG INCREASE KNOWLEDGE OF STROKE PROPHYLAXIS Outcome: Progressing

## 2019-10-08 NOTE — Progress Notes (Signed)
Occupational Therapy Session Note  Patient Details  Name: Paul [name] MRN: [id number] Date of Birth: November 09, 1965  Today's Date: 10/08/2019 OT Individual Time: 1300-1400 OT Individual Time Calculation (min): 60 min   Skilled Therapeutic Interventions/Progress Updates:    Pt greeted in bed, amenable to tx and requesting to go outside. Mod A for managing Lt side during bed mobility and Min A for squat pivot<w/c going towards the Rt side. OT then escorted pt to the outdoor courtyard. While sitting, pt engaged in self ROM exercises with guided instruction. He needed HOH most of the time for carryover of technique due to motor planning deficits and Lt inattention. OT intermittently performed passive stretches with pt able to feel the external rotation stretch in his shoulder, stating it felt "good." He was unable to feel the end ranges for stretches involving elbow, wrist, and digits. Also reported being unable to feel light touch on affected UE. Pt particularly liked arm cradles and active assisted shoulder shrugs, reporting once again having proprioceptive awareness in the shoulder with movement. He was then returned to the room via w/c and completed squat pivot<bed with Min A using the bedrail. Pt able to hook L LE when transitioning to supine. OT supported Lt LE while pt bridged and scooted to improve position in bed. He was still reporting a HA, declining medicine from RN but agreeable for OT to place lavender scented cotton balls in his pillow to address. Left him with all needs within reach and bed alarm set. Tx focus placed on pt education, psychosocial health, and transfers.    Therapy Documentation Precautions:  Precautions Precautions: Fall Precaution Comments: left sided weakness and left lateral lean, SBP <160 Restrictions Weight Bearing Restrictions: No Other Position/Activity Restrictions: L PRAFO and WHO donned at rest Vital Signs: Therapy Vitals Temp: 98.4 F (36.9 C) Temp Source:  Oral Pulse Rate: (!) 52 Resp: 16 BP: 111/71 Patient Position (if appropriate): Lying Oxygen Therapy SpO2: 98 % O2 Device: Room Air ADL: ADL Eating: Not assessed Grooming: Moderate assistance Where Assessed-Grooming: Edge of bed Upper Body Bathing: Moderate assistance Where Assessed-Upper Body Bathing: Edge of bed Lower Body Bathing: Maximal assistance Where Assessed-Lower Body Bathing: Edge of bed Upper Body Dressing: Maximal assistance Where Assessed-Upper Body Dressing: Edge of bed Lower Body Dressing: Maximal assistance Where Assessed-Lower Body Dressing: Edge of bed Toileting: Not assessed Toilet Transfer: Not assessed Tub/Shower Transfer: Not assessed      Therapy/Group: Individual Therapy  Aloria Looper A Cheresa Siers 10/08/2019, 4:53 PM

## 2019-10-09 ENCOUNTER — Inpatient Hospital Stay (HOSPITAL_COMMUNITY): Payer: Self-pay | Admitting: Speech Pathology

## 2019-10-09 NOTE — Progress Notes (Signed)
Speech Language Pathology Daily Session Note  Patient Details  Name: Adamo Merrithew MRN: [id number] Date of Birth: 06-03-1966  Today's Date: 10/09/2019 SLP Individual Time: [id number] SLP Individual Time Calculation (min): 56 min  Short Term Goals: Week 2: SLP Short Term Goal 1 (Week 2): Pt will consume current diet with minmal overt s/sx aspiration and demonstrate efficient mastication and oral clearance with Supervision A verbal cues for use of swallow strategies. SLP Short Term Goal 2 (Week 2): Pt will consume trials of thin liquids over 3 sessions with minimal overt s/sx aspiration with Min verbal cues to assess readiness for repeat MBSS. SLP Short Term Goal 3 (Week 2): Pt will demonstrate ability to problem solve functional, familiar and basic tasks with Min A verbal/visual cues. SLP Short Term Goal 4 (Week 2): Patient will demonstrate sustained attention to functional tasks for ~20 minutes with Min verbal cues for redirection. SLP Short Term Goal 5 (Week 2): Patient will attend to left field of enviornment during functional tasks with Min verbal cues. SLP Short Term Goal 6 (Week 2): Pt will use speech intelligibility strategies to increase intelligibility to 90% at the sentence level with supervision cues.  Skilled Therapeutic Interventions:  Pt was seen for skilled ST targeting dysphagia and cognitive goals. Pt required overall Mod A verbal cues and use of memory notebook to recall functional information throughout session, including rules regarding water protocol, therapist names, daily therapy activities/goals, etc. Pt also appeared to confuse conversation regarding past events with today - I.e. when discussing what to record in his memory notebook for today's ST session he reported we "went outside and to the gym", which was no accurate. Mod A verbal cues required for recall of accurate information from session. Pt disoriented to date, therefor calendar left in room to assist. During a novel  card task, 1 verbal cue was also required for error awareness and Min A verbal cues required for redirection to task in a quiet environment.  Following oral care completed by pt and in compliance with [place] Free Water Protocol, pt consumed 6 tsp thin H2O, exhibiting immediate weak cough or throat clear response in 4/6 presentations. Would recommend pt remain on current diet and continue trials at bedside prior to repeat MBSS. Pt left laying semi-reclined in bed, alarm set, needs within reach. Continue per current plan of care.        Pain Pain Assessment Pain Scale: Faces Faces Pain Scale: No hurt  Therapy/Group: Individual Therapy  Arbutus Leas 10/09/2019, 11:03 AM

## 2019-10-09 NOTE — Plan of Care (Signed)
  Problem: Consults Goal: RH GENERAL PATIENT EDUCATION Description: See Patient Education module for education specifics. Outcome: Progressing Goal: Skin Care Protocol Initiated - if Braden Score 18 or less Description: If consults are not indicated, leave blank or document N/A Outcome: Progressing Goal: Nutrition Consult-if indicated Outcome: Progressing

## 2019-10-09 NOTE — Progress Notes (Signed)
[place] PHYSICAL MEDICINE & REHABILITATION PROGRESS NOTE  Subjective/Complaints:   Pt reports no issues- mood feels better per pt today (however looks just as depressed)- thee's nothing I can do to help him today.    ROS: Pt denies SOB, abd pain, CP, N/V/C/D, and vision changes   Objective:   No results found. No results for input(s): WBC, HGB, HCT, PLT in the last 72 hours. No results for input(s): NA, K, CL, CO2, GLUCOSE, BUN, CREATININE, CALCIUM in the last 72 hours.  Intake/Output Summary (Last 24 hours) at 10/09/2019 1320 Last data filed at 10/09/2019 0830 Gross per 24 hour  Intake 290 ml  Output 600 ml  Net -310 ml     Physical Exam: Vital Signs Blood pressure 115/68, pulse (!) 54, temperature 97.9 F (36.6 C), resp. rate 17, height 6' (1.829 m), weight 76 kg, SpO2 99 %. Constitutional:looks similar to yesterday- listless- sitting in bed staring at nothing, tv off, NAD HENT: Normocephalic.  Atraumatic. Eyes: conjugate gaze Cardiovascular: bracycardic regular rhythm Respiratory: CTA B/L- no W/R/R- good air movement GI: Soft, NT, ND, (+)BS  Skin: Warm and dry.  Intact. Psych: flat listless affect Musc: No edema in extremities.  No tenderness in extremities. Neurological: Alert Motor: LUE/LE: 0/5 proximal distal No increase in tone noted Left facial weakness  Assessment/Plan: 1. Functional deficits secondary to right thalamic hemorrhage which require 3+ hours per day of interdisciplinary therapy in a comprehensive inpatient rehab setting.  Physiatrist is providing close team supervision and 24 hour management of active medical problems listed below.  Physiatrist and rehab team continue to assess barriers to discharge/monitor patient progress toward functional and medical goals  Care Tool:  Bathing    Body parts bathed by patient: Chest, Abdomen, Front perineal area, Right upper leg, Left upper leg, Face   Body parts bathed by helper: Buttocks, Right arm,  Left arm, Right lower leg, Left lower leg     Bathing assist Assist Level: Maximal Assistance - Patient 24 - 49%     Upper Body Dressing/Undressing Upper body dressing   What is the patient wearing?: Pull over shirt    Upper body assist Assist Level: Maximal Assistance - Patient 25 - 49%    Lower Body Dressing/Undressing Lower body dressing      What is the patient wearing?: Underwear/pull up, Pants     Lower body assist Assist for lower body dressing: Maximal Assistance - Patient 25 - 49%     Toileting Toileting Toileting Activity did not occur (Clothing management and hygiene only): N/A (no void or bm)  Toileting assist Assist for toileting: Dependent - Patient 0%     Transfers Chair/bed transfer  Transfers assist     Chair/bed transfer assist level: Moderate Assistance - Patient 50 - 74%     Locomotion Ambulation   Ambulation assist   Ambulation activity did not occur: Safety/medical concerns(Increased R hip pain in standing, pt unable to tolerance standing)  Assist level: 2 helpers Assistive device: (3 muskateers) Max distance: 20 ft   Walk 10 feet activity   Assist  Walk 10 feet activity did not occur: Safety/medical concerns(Increased R hip pain in standing, pt unable to tolerance standing)  Assist level: 2 helpers Assistive device: (3 muskateers)   Walk 50 feet activity   Assist Walk 50 feet with 2 turns activity did not occur: Safety/medical concerns(Increased R hip pain in standing, pt unable to tolerance standing)         Walk 150 feet activity  Assist Walk 150 feet activity did not occur: Safety/medical concerns(Increased R hip pain in standing, pt unable to tolerance standing)         Walk 10 feet on uneven surface  activity   Assist Walk 10 feet on uneven surfaces activity did not occur: Safety/medical concerns(Increased R hip pain in standing, pt unable to tolerance standing)         Wheelchair     Assist Will  patient use wheelchair at discharge?: Yes Type of Wheelchair: Manual    Wheelchair assist level: Supervision/Verbal cueing Max wheelchair distance: 75 ft    Wheelchair 50 feet with 2 turns activity    Assist    Wheelchair 50 feet with 2 turns activity did not occur: Safety/medical concerns(unable without skilled intervention)   Assist Level: Supervision/Verbal cueing   Wheelchair 150 feet activity     Assist  Wheelchair 150 feet activity did not occur: Safety/medical concerns(unable without skilled intervention)   Assist Level: Minimal Assistance - Patient > 75%   Blood pressure 115/68, pulse (!) 54, temperature 97.9 F (36.6 C), resp. rate 17, height 6' (1.829 m), weight 76 kg, SpO2 99 %.  Medical Problem List and Plan: 1.Left hemiparesis and visual-spatial deficitssecondary to right thalamic hemorrhage with intraventricular extension  Continue CIR  -WHO,PRAFO for LUE and LLE  -Team conference 3/9: CG to MinA goals. DC date of 3/26. Can initiate respiratory therapy. Brother will be involved in caregiver training and can provide 24/7 supervision upon discharge.  2. Antithrombotics:  -DVT/anticoagulation:Mechanical:Sequential compression devices, below kneeBilateral lower extremities   Dopplers negative for DVT -antiplatelet therapy: N/a 3.Headaches/Pain Management:Fioricet or ultram prnhaven't helped a great deal.  Topamax increased to 50mg  bid on 3/1  Amitriptyline increased to 50qHS for both headache and insomnia on 3/7.   Will increase Topamax to 75mg  BID for better control of headaches.   K pad ordered for low back pain  3/12: started Depakote for mood stabilization/ headache. Will decrease Amitriptyline given increased lethargy.  4. Mood:LCSW to follow for evaluation and support. -antipsychotic agents: N/A  -Will request neuropsych input, gets frustrated easily and says he is "pitiful" and doing horribly.  Neuropsych eval scheduled for Friday. I counseled patient today that he has been working very hard and continuing to show improvements and he was very Patent attorney of comments.  5. Neuropsych: This patientiscapable of making decisions on hisown behalf. Appreciate neuropsych eval today; patient discussed his feelings of worthlessness.  6. Skin/Wound Care:Routine pressure relief measures. 7. Fluids/Electrolytes/Nutrition:Monitor I/Os. Offer nectar liquids between meals to maintain adequate hydration. 8. HTN: Monitor BP tid-continue Hydralazine, Cozaar, Coreg, Catapres and Norvasc. TItrateas indicated--SBP goal<140.  Controlled on 3/8, 3 /10-actually has been soft and bradycardic. Will decrease Amlodipine to 5mg  daily.  3/11: Will decrease Amlodipine further to 2.5mg .  3/12: will stop amlodipine.  3/13- bradycardic- decrease losartan to 25 mg BID and monitor- BP soft as well  3/14- BP slightly better in 0000000 systolic today but still bradycardic to 52- denies symptoms- con't regimen for now 9. Prediabetes: Hgb A1C- 6.2--was 5.7 eight months ago. Will have RD educate on CM diet--- monitor BS ac/hs with SSI for now.   Mildly elevated on 3/8, 3/10. Continue to monitor.   3/11: has been well controlled; now off daily CBGs and SSI 10. Tobacco/Cannabis use: Reports that he plans on quitting.  11. Dyslipidemia: Statin on hold due to ICH--resume at discharge. 12.  Post stroke dysphagia: doesn't like consistency but is tolerating due to nectars so far with supervision  13.  AKI  Creatinine 1.35 on 3/8  Encourage fluids  IVF to adjust x3 night started on 3/8, echo reviewed with EF of 55-60%  See #12 15. Urinary retention: cathing every 8 hours since volumes are low.  3/13- in/out caths- unable to void- con't in/out caths- make sure family has education on this as well.  3/14- not sure if team has attempted flomax, Urocholine, but might be appropriate  - will make recs. 16.  Disposition: Brother has been present and is receiving education  LOS: 15 days A FACE TO FACE EVALUATION WAS PERFORMED  [name] Shedlock 10/09/2019, 1:20 PM

## 2019-10-10 ENCOUNTER — Inpatient Hospital Stay (HOSPITAL_COMMUNITY): Payer: Self-pay | Admitting: Speech Pathology

## 2019-10-10 ENCOUNTER — Inpatient Hospital Stay (HOSPITAL_COMMUNITY): Payer: Self-pay | Admitting: Occupational Therapy

## 2019-10-10 ENCOUNTER — Inpatient Hospital Stay (HOSPITAL_COMMUNITY): Payer: Self-pay | Admitting: Physical Therapy

## 2019-10-10 LAB — CBC
HCT: 37.5 % — ABNORMAL LOW (ref 39.0–52.0)
Hemoglobin: 12.1 g/dL — ABNORMAL LOW (ref 13.0–17.0)
MCH: 30.6 pg (ref 26.0–34.0)
MCHC: 32.3 g/dL (ref 30.0–36.0)
MCV: 94.9 fL (ref 80.0–100.0)
Platelets: 435 10*3/uL — ABNORMAL HIGH (ref 150–400)
RBC: 3.95 MIL/uL — ABNORMAL LOW (ref 4.22–5.81)
RDW: 13.2 % (ref 11.5–15.5)
WBC: 7.5 10*3/uL (ref 4.0–10.5)
nRBC: 0 % (ref 0.0–0.2)

## 2019-10-10 LAB — BASIC METABOLIC PANEL
Anion gap: 11 (ref 5–15)
BUN: 30 mg/dL — ABNORMAL HIGH (ref 6–20)
CO2: 28 mmol/L (ref 22–32)
Calcium: 9.9 mg/dL (ref 8.9–10.3)
Chloride: 103 mmol/L (ref 98–111)
Creatinine, Ser: 1.47 mg/dL — ABNORMAL HIGH (ref 0.61–1.24)
GFR calc Af Amer: >60 mL/min (ref >60)
GFR calc non Af Amer: 54 mL/min — ABNORMAL LOW (ref >60)
Glucose, Bld: 107 mg/dL — ABNORMAL HIGH (ref 70–99)
Potassium: 4.5 mmol/L (ref 3.5–5.1)
Sodium: 142 mmol/L (ref 135–145)

## 2019-10-10 MED ORDER — BETHANECHOL CHLORIDE 10 MG PO TABS
10.0000 mg | ORAL_TABLET | Freq: Three times a day (TID) | ORAL | Status: DC
  Administered 2019-10-10 – 2019-10-13 (×9): 10 mg via ORAL
  Filled 2019-10-10 (×9): qty 1

## 2019-10-10 MED ORDER — SODIUM CHLORIDE 0.45 % IV SOLN
INTRAVENOUS | Status: DC
  Administered 2019-10-12: 900 mL via INTRAVENOUS

## 2019-10-10 MED ORDER — ACETAMINOPHEN 325 MG PO TABS: 325.0000 mg | ORAL_TABLET | ORAL | Status: DC | PRN

## 2019-10-10 NOTE — Progress Notes (Signed)
Speech Language Pathology Weekly Progress and Session Note  Patient Details  Name: Paul [name] MRN: [id number] Date of Birth: June 21, 1966  Beginning of progress report period: October 03, 2019 End of progress report period: October 10, 2019  Today's Date: 10/10/2019 SLP Individual Time: 7829-5621 SLP Individual Time Calculation (min): 10 min and Today's Date: 10/10/2019 SLP Missed Time: 35 Minutes Missed Time Reason: Patient fatigue;Pain  Short Term Goals: Week 2: SLP Short Term Goal 1 (Week 2): Pt will consume current diet with minmal overt s/sx aspiration and demonstrate efficient mastication and oral clearance with Supervision A verbal cues for use of swallow strategies. SLP Short Term Goal 1 - Progress (Week 2): Not met SLP Short Term Goal 2 (Week 2): Pt will consume trials of thin liquids over 3 sessions with minimal overt s/sx aspiration with Min verbal cues to assess readiness for repeat MBSS. SLP Short Term Goal 2 - Progress (Week 2): Not met SLP Short Term Goal 3 (Week 2): Pt will demonstrate ability to problem solve functional, familiar and basic tasks with Min A verbal/visual cues. SLP Short Term Goal 3 - Progress (Week 2): Not met SLP Short Term Goal 4 (Week 2): Patient will demonstrate sustained attention to functional tasks for ~20 minutes with Min verbal cues for redirection. SLP Short Term Goal 4 - Progress (Week 2): Met SLP Short Term Goal 5 (Week 2): Patient will attend to left field of enviornment during functional tasks with Min verbal cues. SLP Short Term Goal 5 - Progress (Week 2): Met SLP Short Term Goal 6 (Week 2): Pt will use speech intelligibility strategies to increase intelligibility to 90% at the sentence level with supervision cues. SLP Short Term Goal 6 - Progress (Week 2): Not met    New Short Term Goals: Week 3: SLP Short Term Goal 1 (Week 3): Pt will consume current diet with minmal overt s/sx aspiration and demonstrate efficient mastication and oral clearance  with Supervision A verbal cues for use of swallow strategies. SLP Short Term Goal 2 (Week 3): Pt will consume trials of thin liquids over 3 sessions with minimal overt s/sx aspiration with Min verbal cues to assess readiness for repeat MBSS. SLP Short Term Goal 3 (Week 3): Pt will demonstrate ability to problem solve functional, familiar and basic tasks with Min A verbal/visual cues. SLP Short Term Goal 4 (Week 3): Patient will demonstrate sustained attention to functional tasks for ~30 minutes with Min verbal cues for redirection. SLP Short Term Goal 5 (Week 3): Pt will use speech intelligibility strategies to increase intelligibility to 90% at the sentence level with supervision cues. SLP Short Term Goal 6 (Week 3): Patient will utilize external aids to recall new, daily information with Mod verbal and visual cues.  Weekly Progress Updates: Patient has made slow and inconsistent gains and has met 2 of 6 STGs this reporting period. Currently, patient is consuming Dys. 2 textures with nectar-thick liquids with minimal overt s/s of aspiration and Min A verbal cues to utilize swallowing compensatory strategies, especially in regards to mastication, oral clearance and left anterior spillage. Patient is also consuming trials of water with intermittent overt s/s of aspiration which appear to be related to fatigue level. Recommend ongoing trials prior to repeat MBS. Patient also continues to require Min A verbal cues for use of speech intelligibility strategies at the sentence level to achieve 90% intelligibility. Overall Mod verbal cues are required for basic problem solving and for use of external aids to maximize recall of daily  information. Suspect function impacted by constant headache, fatigue and flat affect/feelings of sadness. Patient and family education ongoing.  Patient would benefit from continued skilled SLP intervention to maximize his swallowing and cognitive function as well as his functional  communication prior to discharge.      Intensity: Minumum of 1-2 x/day, 30 to 90 minutes Frequency: 3 to 5 out of 7 days Duration/Length of Stay: 3/26 Treatment/Interventions: Cognitive remediation/compensation;Speech/Language facilitation;Cueing hierarchy;Functional tasks;Internal/external aids;Dysphagia/aspiration precaution training;Patient/family education;Therapeutic Activities;Environmental controls   Daily Session  Skilled Therapeutic Interventions: Skilled treatment session focused on cognitive goals. Upon arrival, patient was sitting upright in the wheelchair and was utilizing gestures to communicate. Despite encouragement from SLP, patient continued to gesture without verbal expression. Patient then grabbed his neck with one hand like he was choking. When SLP asked what was wrong, he reported, "I'm choking." Patient did not display any signs of distress and SLP educated patient on if he truly were choking, he would not be able to breath or communicate. When asked if his throat was dry, he became frustrated and reported, "yeah, you're a therapist, you should know what that means. Go get me water."  Patient consumed nectar-thick liquids without overt s/s of aspiration. Patient reported he was tired and had a headache, (RN aware and patient premedicated) and declined trials of upgraded textures or participation in any tasks SLP requested. Therefore, patient missed remaining 35 minutes of session. RN aware and transferred patient to bed. Continue with current plan of care.        Pain Pain Assessment Pain Scale: 0-10 Pain Score: 4  Pain Type: Acute pain Pain Location: Head Pain Descriptors / Indicators: Headache Pain Frequency: Intermittent Pain Onset: On-going Patients Stated Pain Goal: 0 Pain Intervention(s): Medication (See eMAR)  Therapy/Group: Individual Therapy  Clementine Soulliere 10/10/2019, 6:40 AM

## 2019-10-10 NOTE — Progress Notes (Signed)
Occupational Therapy Weekly Progress Note  Patient Details  Name: Paul [name] MRN: [id number] Date of Birth: [birth year]  Beginning of progress report period: September 25, 2019 End of progress report period: October 10, 2019  Today's Date: 10/10/2019 OT Individual Time: 4782-9562 OT Individual Time Calculation (min): 72 min    Patient has met 2 of 3 short term goals.  Pt is making slow but steady progress towards OT goals. Pt has had minimal return in L UE, continues to be flaccid, but still without tone or spasticity. Pt can be as little as min A to transfer to the R, but can need up to max A to pivot to the L. Pt's sitting balance has improved to min A/mod A overall within BADL tasks. Continue current POC.  Patient continues to demonstrate the following deficits: muscle weakness, impaired timing and sequencing, abnormal tone, unbalanced muscle activation, motor apraxia, ataxia, decreased coordination and decreased motor planning, decreased attention to left and decreased motor planning, decreased initiation, decreased attention, decreased awareness, decreased problem solving, decreased safety awareness, decreased memory and delayed processing and decreased sitting balance, decreased standing balance, decreased postural control, hemiplegia and decreased balance strategies and therefore will continue to benefit from skilled OT intervention to enhance overall performance with BADL and Reduce care partner burden.  Patient progressing toward long term goals..  Continue plan of care.  OT Short Term Goals Week 2:  OT Short Term Goal 1 (Week 2): Pt will complete shower transfer with 1 assist OT Short Term Goal 1 - Progress (Week 2): Met OT Short Term Goal 2 (Week 2): Pt will maintain sitting balance at EOB with no more than Mod A for balance within BADL task OT Short Term Goal 2 - Progress (Week 2): Met OT Short Term Goal 3 (Week 2): Pt will maintain standing balance with mod A within BADL task OT  Short Term Goal 3 - Progress (Week 2): Progressing toward goal Week 3:  OT Short Term Goal 1 (Week 3): Pt will maintain standing balance with mod A within BADL task OT Short Term Goal 2 (Week 3): Pt will thread L UE through shirt sleeve with no more than min A from OT OT Short Term Goal 3 (Week 3): Pt will complete transfer to weaker L side with no more than mod A  Skilled Therapeutic Interventions/Progress Updates:    Pt greeted semi-reclined in bed awake and agreeable to OT treatment session. Pt requested to shower this morning. Pt came to sitting EOB by hooking LLE to bring towards EOB, but then needed mod A to elevate trunk. Worked on stand-pivot to weaker L side with max A and L knee block as pt also pushing himself over when pivoting. Pt requested to shave at the sink first. OT handed pt his electric razor and he was able to shave with only min verbal cue from OT for thoroughness. Pt then wanted to go over with regular razor. Set-up A to apply shaving cream. Pt's mood was very down today with very poor frustration tolerance. Pt getting frustrated with OT when providing encouragement and praise for increased independence within BADL tasks. Noted that hot water was not working and front desk notified. Deferred showering 2/2 no hot water. Pt did complete some bathing at the sink with min A for UB. Sit<>stand with mod A and mod/max A to maintain standing balance +knee block while pt washing buttocks and peri-area. Worked on dressing strategies with pt continuing to get very frustrated and yanking L UE  around. OT educated on positioning and gently moving and stretching L UE. Worked on LB dressing in figure 4 position with pt able to don socks today using 1 handed technique. Pt not impressed with being able to don socks despite OT encouragement. Max A to don shoes. Pt left seated in wc with alarm belt on and L UE supported on 1.2 lap tray. OT placed SAEBO e-stim on wrist extensors. SAEBO left on for 60  minutes. OT returned to remove SAEBO with skin intact and no adverse reactions.  [place] Stim One 330 pulse width 35 Hz pulse rate On 8 sec/ off 8 sec Ramp up/ down 2 sec Symmetrical Biphasic wave form  Max intensity 122m at 500 Ohm load   Therapy Documentation Precautions:  Precautions Precautions: Fall Precaution Comments: left sided weakness and left lateral lean, SBP <160 Restrictions Weight Bearing Restrictions: No Other Position/Activity Restrictions: L PRAFO and WHO donned at rest Pain: Pain Assessment Pain Scale: 0-10 Pain Score: 4  Pain Type: Acute pain Pain Location: Head Pain Descriptors / Indicators: Headache Pain Frequency: Intermittent Pain Onset: On-going Patients Stated Pain Goal: 0 Pain Intervention(s): Medication (See eMAR)  Therapy/Group: Individual Therapy  EValma Cava3/15/2021, 9:51 AM

## 2019-10-10 NOTE — Progress Notes (Signed)
PHYSICAL MEDICINE & REHABILITATION PROGRESS NOTE  Subjective/Complaints:   Up at sink. Just shaved beard. Headache "there" but better. Left side still not working  ROS: Patient denies fever, rash, sore throat, blurred vision, nausea, vomiting, diarrhea, cough, shortness of breath or chest pain, joint or back pain,   or mood change.    Objective:   No results found. Recent Labs    10/10/19 0547  WBC 7.5  HGB 12.1*  HCT 37.5*  PLT 435*   Recent Labs    10/10/19 0547  NA 142  K 4.5  CL 103  CO2 28  GLUCOSE 107*  BUN 30*  CREATININE 1.47*  CALCIUM 9.9    Intake/Output Summary (Last 24 hours) at 10/10/2019 1432 Last data filed at 10/10/2019 1141 Gross per 24 hour  Intake 480 ml  Output 725 ml  Net -245 ml     Physical Exam: Vital Signs Blood pressure 119/76, pulse (!) 49, temperature (!) 97.5 F (36.4 C), temperature source Oral, resp. rate 16, height 6' (1.829 m), weight 76 kg, SpO2 99 %. Constitutional: No distress . Vital signs reviewed. HEENT: EOMI, oral membranes moist Neck: supple Cardiovascular: RRR without murmur. No JVD    Respiratory/Chest: CTA Bilaterally without wheezes or rales. Normal effort    GI/Abdomen: BS +, non-tender, non-distended Ext: no clubbing, cyanosis, or edema Skin: Warm and dry.  Intact. Psych: a little more engaging today, blunt though Musc: No edema in extremities.  No tenderness in extremities. Neurological: Alert Motor: LUE/LE: 0/5 without resting tone Left central 7 and inattention  Assessment/Plan: 1. Functional deficits secondary to right thalamic hemorrhage which require 3+ hours per day of interdisciplinary therapy in a comprehensive inpatient rehab setting.  Physiatrist is providing close team supervision and 24 hour management of active medical problems listed below.  Physiatrist and rehab team continue to assess barriers to discharge/monitor patient progress toward functional and medical goals  Care  Tool:  Bathing    Body parts bathed by patient: Chest, Abdomen, Front perineal area, Right upper leg, Left upper leg, Face   Body parts bathed by helper: Buttocks, Right arm, Left arm, Right lower leg, Left lower leg     Bathing assist Assist Level: Maximal Assistance - Patient 24 - 49%     Upper Body Dressing/Undressing Upper body dressing   What is the patient wearing?: Pull over shirt    Upper body assist Assist Level: Maximal Assistance - Patient 25 - 49%    Lower Body Dressing/Undressing Lower body dressing      What is the patient wearing?: Underwear/pull up, Pants     Lower body assist Assist for lower body dressing: Maximal Assistance - Patient 25 - 49%     Toileting Toileting Toileting Activity did not occur (Clothing management and hygiene only): N/A (no void or bm)  Toileting assist Assist for toileting: Dependent - Patient 0%     Transfers Chair/bed transfer  Transfers assist     Chair/bed transfer assist level: Moderate Assistance - Patient 50 - 74%     Locomotion Ambulation   Ambulation assist   Ambulation activity did not occur: Safety/medical concerns(Increased R hip pain in standing, pt unable to tolerance standing)  Assist level: 2 helpers Assistive device: (3 muskateers) Max distance: 20 ft   Walk 10 feet activity   Assist  Walk 10 feet activity did not occur: Safety/medical concerns(Increased R hip pain in standing, pt unable to tolerance standing)  Assist level: 2 helpers Assistive device: (3 muskateers)  Walk 50 feet activity   Assist Walk 50 feet with 2 turns activity did not occur: Safety/medical concerns(Increased R hip pain in standing, pt unable to tolerance standing)         Walk 150 feet activity   Assist Walk 150 feet activity did not occur: Safety/medical concerns(Increased R hip pain in standing, pt unable to tolerance standing)         Walk 10 feet on uneven surface  activity   Assist Walk 10 feet  on uneven surfaces activity did not occur: Safety/medical concerns(Increased R hip pain in standing, pt unable to tolerance standing)         Wheelchair     Assist Will patient use wheelchair at discharge?: Yes Type of Wheelchair: Manual    Wheelchair assist level: Supervision/Verbal cueing Max wheelchair distance: 75 ft    Wheelchair 50 feet with 2 turns activity    Assist    Wheelchair 50 feet with 2 turns activity did not occur: Safety/medical concerns(unable without skilled intervention)   Assist Level: Supervision/Verbal cueing   Wheelchair 150 feet activity     Assist  Wheelchair 150 feet activity did not occur: Safety/medical concerns(unable without skilled intervention)   Assist Level: Minimal Assistance - Patient > 75%   Blood pressure 119/76, pulse (!) 49, temperature (!) 97.5 F (36.4 C), temperature source Oral, resp. rate 16, height 6' (1.829 m), weight 76 kg, SpO2 99 %.  Medical Problem List and Plan: 1.Left hemiparesis and visual-spatial deficitssecondary to right thalamic hemorrhage with intraventricular extension  Continue CIR  -WHO,PRAFO for LUE and LLE  -team conference tomorrow 2. Antithrombotics:  -DVT/anticoagulation:Mechanical:Sequential compression devices, below kneeBilateral lower extremities   Dopplers negative for DVT -antiplatelet therapy: N/a 3.Headaches/Pain Management:Fioricet or ultram pr[place]'t helped a great deal.  Topamax increased to 50mg  bid on 3/1  Amitriptyline increased to 50qHS for both headache and insomnia on 3/7.   Will increase Topamax to 75mg  BID for better control of headaches.   K pad ordered for low back pain  3/12: started Depakote for mood stabilization/ headache. Will decrease Amitriptyline given increased lethargy.   3/15 h/a seem better. Dc elavil, continue depakote 4. Mood:LCSW to follow for evaluation and support. -antipsychotic agents:  N/A  -appreciate neuropsych input and assistance   -initiate trial of celexa 10mg  qhs 5. Neuropsych: This patientiscapable of making decisions on hisown behalf.   6. Skin/Wound Care:Routine pressure relief measures. 7. Fluids/Electrolytes/Nutrition:Monitor I/Os. Offer nectar liquids between meals to maintain adequate hydration. 8. HTN: Monitor BP tid-continue Hydralazine, Cozaar, Coreg, Catapres and Norvasc. TItrateas indicated--SBP goal<140.    Nanda Quinton has been soft and bradycardic. Will decrease Amlodipine to 5mg  daily.  3/11: Will decrease Amlodipine further to 2.5mg .  3/12: will stop amlodipine.  3/13- bradycardic- decrease losartan to 25 mg BID and monitor- BP soft as well  3/15 improved control. No changes 9. Prediabetes: Hgb A1C- 6.2--was 5.7 eight months ago. Will have RD educate on CM diet--- monitor BS ac/hs with SSI for now.   Mildly elevated on 3/8, 3/10. Continue to monitor.   3/11: has been well controlled; now off daily CBGs and SSI 10. Tobacco/Cannabis use: Reports that he plans on quitting.  11. Dyslipidemia: Statin on hold due to ICH--resume at discharge. 12.  Post stroke dysphagia: doesn't like consistency but is tolerating due to nectars so far with supervision 13.  AKI  Creatinine 1.35 on 3/8  Encourage fluids  IVF to adjust x3 night started on 3/8, echo reviewed with EF of 55-60%  BUN/Cr still elevated/increased. Resume evening IVF fluids 15. Urinary retention: cathing every 8 hours since volumes are low.  Still retaining. Begin urecholine trial  -timed voids, encourage to empty prior to cath. 16. Disposition: Brother has been present and is receiving education  LOS: 16 days A FACE TO FACE EVALUATION WAS PERFORMED  Meredith Staggers 10/10/2019, 2:32 PM

## 2019-10-10 NOTE — Progress Notes (Signed)
Physical Therapy Session Note  Patient Details  Name: Paul [name] MRN: [id number] Date of Birth: [birth year]  Today's Date: 10/10/2019 PT Individual Time: [id number] PT Individual Time Calculation (min): 55 min   Short Term Goals: Week 3:  PT Short Term Goal 1 (Week 3): Pt will consistently complete w/c<>bed transfers to L & R with mod assist with min cuing for safety awareness. PT Short Term Goal 2 (Week 3): Pt will complete car transfer with max assist +1. PT Short Term Goal 3 (Week 3): Pt will propel w/c x 50 ft with supervision.  Skilled Therapeutic Interventions/Progress Updates:  Pt received in bed & agreeable to tx. Therapist donned/doffed tennis shoes dependent assist. Supine>sit with min assist with therapist assisting LLE with pt not appearing to attend to L side at all. Pt rolls L<>R with min assist with cuing for compensatory technique to use RLE to assist LLE with rolling, and sit>supine with min assist with ongoing cuing for using RLE to assist LLE onto bed. Stand pivot transfers to R with mod assist, to L with +2 2/2 L knee buckling and decreased safety awareness overall, impaired attention to L side with therapist assisting pt to safely sit in w/c and providing cuing for overall technique. Sit<>stand at high/low table with min assist with pt standing with mod/max assist while engaging in reaching to L of midline to sort coins then engage in games of connect 4 with task focusing on scanning to L of midline, L attention, standing balance, trunk control and upright posture. Pt demonstrates L lateral lean but little to no awareness of this, requiring max cuing to shift weight to R during standing task. Therapist blocks L knee to prevent buckling while standing. Pt sat at elevated EOM without BLE support and engaged in hitting beach ball with min assist with focus on dynamic sitting balance and core/trunk control with much improvement compared to last time task was attempted with this  therapist. Pt propels w/c dayroom>room with R hemi technique and supervision. Pt consumed thickened liquid with 1 coughing episode after finishing drink. At end of session pt left in bed with alarm set, PRAFO & hand splint donned, call bell in reach.   Pt was able to recall events of session but also asked where he was with pt correctly selecting when given choice of 2.   Therapy Documentation Precautions:  Precautions Precautions: Fall Precaution Comments: left sided weakness and left lateral lean, SBP <160 Restrictions Weight Bearing Restrictions: No Other Position/Activity Restrictions: L PRAFO and WHO donned at rest  Pain: Pt reports tenderness in R thigh but declines asking for pain medication (unreported until end of session). Pt reports tingling in L side.    Therapy/Group: Individual Therapy  [name] 10/10/2019, 3:44 PM

## 2019-10-10 NOTE — Plan of Care (Signed)
  Problem: RH Swallowing Goal: LTG Patient will consume least restrictive diet using compensatory strategies with assistance (SLP) Description: LTG:  Patient will consume least restrictive diet using compensatory strategies with assistance (SLP) Flowsheets (Taken 10/10/2019 0635) LTG: Pt Patient will consume least restrictive diet using compensatory strategies with assistance of (SLP): Supervision Note: Downgraded 3/15 due to lack of progress, CMP Goal: LTG Patient will participate in dysphagia therapy to increase swallow function with assistance (SLP) Description: LTG:  Patient will participate in dysphagia therapy to increase swallow function with assistance (SLP) Flowsheets (Taken 10/10/2019 [id number]) LTG: Pt will participate in dysphagia therapy to increase swallow function with assistance of (SLP): Supervision Note: Downgraded 3/15 due to lack of progress, CMP   Problem: RH Expression Communication Goal: LTG Patient will increase speech intelligibility (SLP) Description: LTG: Patient will increase speech intelligibility at word/phrase/conversation level with cues, % of the time (SLP) Flowsheets (Taken 10/10/2019 0635) LTG: Patient will increase speech intelligibility (SLP): Supervision Level: Phrase Note: Downgraded 3/15 due to lack of progress, CMP   Problem: RH Problem Solving Goal: LTG Patient will demonstrate problem solving for (SLP) Description: LTG:  Patient will demonstrate problem solving for basic/complex daily situations with cues  (SLP) Flowsheets (Taken 10/10/2019 [id number]) LTG: Patient will demonstrate problem solving for (SLP): Basic daily situations LTG Patient will demonstrate problem solving for: Minimal Assistance - Patient > 75% Note: Downgraded 3/15 due to lack of progress, CMP

## 2019-10-11 ENCOUNTER — Inpatient Hospital Stay (HOSPITAL_COMMUNITY): Payer: Self-pay | Admitting: Physical Therapy

## 2019-10-11 ENCOUNTER — Inpatient Hospital Stay (HOSPITAL_COMMUNITY): Payer: Self-pay | Admitting: Speech Pathology

## 2019-10-11 ENCOUNTER — Inpatient Hospital Stay (HOSPITAL_COMMUNITY): Payer: Self-pay | Admitting: Occupational Therapy

## 2019-10-11 NOTE — Plan of Care (Signed)
  Problem: Consults Goal: RH GENERAL PATIENT EDUCATION Description: See Patient Education module for education specifics. Outcome: Progressing Goal: Skin Care Protocol Initiated - if Braden Score 18 or less Description: If consults are not indicated, leave blank or document N/A Outcome: Progressing Goal: Nutrition Consult-if indicated Outcome: Progressing   Problem: RH BOWEL ELIMINATION Goal: RH STG MANAGE BOWEL WITH ASSISTANCE Description: STG Manage Bowel with min Assistance. Outcome: Progressing

## 2019-10-11 NOTE — Patient Care Conference (Signed)
Inpatient RehabilitationTeam Conference and Plan of Care Update Date: 10/11/2019   Time: 10:35 AM   Patient Name: Paul [name]      Medical Record Number: [id number]  Date of Birth: [birth year] Sex: Male         Room/Bed: 4W14C/4W14C-01 Payor Info: Payor: MEDICAID POTENTIAL / Plan: MEDICAID POTENTIAL / Product Type: *No Product type* /    Admit Date/Time:  09/24/2019  3:47 PM  Primary Diagnosis:  Thalamic hemorrhage [facility])  Patient Active Problem List   Diagnosis Date Noted  . Reactive depression   . AKI (acute kidney injury) (HCC)   . Dysphagia, post-stroke   . Prediabetes   . Acute bilateral low back pain without sciatica   . Vascular headache   . Thalamic hemorrhage (HCC) 09/24/2019  . Cerebral edema (HCC)   . Pure hypercholesterolemia   . ICH (intracerebral hemorrhage) (HCC) 09/17/2019  . Acute CVA (cerebrovascular accident) (HCC) 01/18/2019  . Thrombocytosis (HCC) 01/18/2019  . Pancreatitis 01/07/2019  . Chronic diastolic CHF (congestive heart failure) (HCC) 10/11/2018  . CKD (chronic kidney disease) stage 2, GFR 60-89 ml/min 10/11/2018  . Essential hypertension 05/22/2018  . Dyslipidemia 05/22/2018  . Renal lesion 05/22/2018  . Coronary artery calcification 05/22/2018  . Tobacco abuse 05/22/2018    Expected Discharge Date: Expected Discharge Date: 10/21/19  Team Members Present: Physician leading conference: Dr. Faith Rogue Care Coodinator Present: Cecile Sheerer, LCSWA;Genie Wilmon Conover, RN, MSN Nurse Present: Doran Durand, LPN PT Present: Aleda Grana, PT OT Present: Kearney Hard, OT SLP Present: Feliberto Gottron, SLP PPS Coordinator present : Edson Snowball, Park Breed, SLP     Current Status/Progress Goal Weekly Team Focus  Bowel/Bladder   Continent b/b, urinary retention, Q6H bladder scanm last i/o 3/15  Remain continent of bowel and bladder, resolve retention  PRN bladder scan, i/o >350 mL pvr   Swallow/Nutrition/ Hydration   Dys. 2 textures with  nectar-thick liquids-Min A, water protocol  Supervision  use of swallow strategies with current diet, rials of upgraded textures/liquids   ADL's   Mod/max A transfers L, Min A transfers r, L UE flaccid, poor awareness, Mod/max A BADL tasks  Downgraded min/mod A  self-care retraining, sit<>stands, transfers, standing balance/ednurance, attention, initiation, L NMR, NMES   Mobility   min assist bed mobility in hospital bed, supervision<>min assist w/c mobility, mod<>+2 bed<>w/c transfers, gait with lite gait or 3 muskateers, poor awareness, impaired sensation LLE  downgraded to min/mod assist transfers, max assist gait, supervision<>min assist w/c mobility  sitting/standing balance, core/trunk & postural control, w/c mobility, bed mobility, transfers, gait, L NMR, cognition & awareness   Communication   Min-Mod A  Supervision      Safety/Cognition/ Behavioral Observations  Mod-Max A  Min A  attenion, basic problem solving, recall   Pain   PRN tylenol for headaches, no c/o other pain  Remain at tolerable pain level  QS/PRN pain assessment   Skin   Skin intact, no evidence of breakdown  Maintain skin integrity  QS/PRN skin assessment, reposition frequently    Rehab Goals Patient on target to meet rehab goals: Yes *See Care Plan and progress notes for long and short-term goals.     Barriers to Discharge  Current Status/Progress Possible Resolutions Date Resolved   Nursing                  PT  Behavior;Nutrition means                 OT  SLP                SW                Discharge Planning/Teaching Needs:  D/c to his brother's home with 24/7 care  Family education as recommended by therapy   Team Discussion: MD L side weakness, headaches better, depression med started, emotionally labile, BP good, on IV fluids.  RN BM 3/12, toileted, voided, headache, splint to L arm, prafo boot to L foot at night.  OT transfers min A to R, transfers mod/max to L, knee block  needed, estim L arm, downgrade goals to min/mod.  PT poor awareness, falls L, min transfers R, max transfers L, downgrade to mod A w/c level goals.  SLP D2nectar, trials thins, coughs, confusion at times.   Revisions to Treatment Plan: N/A     Medical Summary Current Status: right thalamic infarct with left hemiparesis, headaches better. not taking enough fluids- ivf resumed last night. tone controlled. depressed Weekly Focus/Goal: rx depression, pain mgt, post-stroke considerations  Barriers to Discharge: Behavior;Medical stability   Possible Resolutions to Barriers: see medical progress notes   Continued Need for Acute Rehabilitation Level of Care: The patient requires daily medical management by a physician with specialized training in physical medicine and rehabilitation for the following reasons: Direction of a multidisciplinary physical rehabilitation program to maximize functional independence : Yes Medical management of patient stability for increased activity during participation in an intensive rehabilitation regime.: Yes Analysis of laboratory values and/or radiology reports with any subsequent need for medication adjustment and/or medical intervention. : Yes   I attest that I was present, lead the team conference, and concur with the assessment and plan of the team.   Lelon Frohlich M 10/11/2019, 3:01 PM   Team conference was held via web/ teleconference due to COVID - 19

## 2019-10-11 NOTE — Progress Notes (Signed)
Nutrition Follow-up  DOCUMENTATION CODES:   Not applicable  INTERVENTION:   Continue snacks TID  Continue Magic cup TID with meals, each supplement provides 290 kcal and 9 grams of protein  Continue [employer] TID, each supplement provides 520 kcal and 22 grams of protein    NUTRITION DIAGNOSIS:   Inadequate oral intake related to dysphagia as evidenced by meal completion < 50%.  Ongoing.  GOAL:   Patient will meet greater than or equal to 90% of their needs  Progressing.   MONITOR:   PO intake, Supplement acceptance, Diet advancement  REASON FOR ASSESSMENT:   Consult Diet education  ASSESSMENT:   Pt with a PMH of HTN, HLD, embolic stroke, tobacco and cannabis use who was admitted on 09/17/19 with right thalamic hemorrhage with IVH and he was transferred to [facility] for management. Pt now with improving mentation and diet advanced to dysphagia 2, nectar liquids. Pt with limitations due to left hemiplegia with left inattention, difficulty with multi-step commands but showing improvement in attention. Admitted to CIR on 2/27.  Pt states his appetite is "so-so;" pt does not like the texture of the dysphagia 2 diet. Pt reports drinking some of the supplements and eating all of the snacks. Discussed pt's diet preferences; pt does not like the meatloaf, chicken with gravy, or [place] with gravy. RD stressed importance of consuming the supplements for protein intake since pt does not like many of the available protein options for his diet order. Pt expressed understanding.   PO intake: 0-100% x last 7 recorded meals (49% average intake)   Medications reviewed and include: MVI, Senokot-S, 1/2 NS @ 73ml/hr   Labs reviewed.   Admit wt: 72.7kg Current wt: 76 kg  UOP: 779ml x24 hours I/O: -5,419.23ml since admit  Diet Order:   Diet Order            DIET DYS 2 Room service appropriate? Yes; Fluid consistency: Nectar Thick  Diet effective now              EDUCATION  NEEDS:   Education needs have been addressed  Skin:  Skin Assessment: Reviewed RN Assessment  Last BM:  3/12  Height:   Ht Readings from Last 1 Encounters:  09/25/19 6' (1.829 m)    Weight:   Wt Readings from Last 1 Encounters:  10/07/19 76 kg    BMI:  Body mass index is 22.72 kg/m.  Estimated Nutritional Needs:   Kcal:  2100-2300  Protein:  95-110 grams  Fluid:  2L/d   Larkin Ina, MS, RD, LDN RD pager number and weekend/on-call pager number located in [place].

## 2019-10-11 NOTE — Progress Notes (Signed)
[place] PHYSICAL MEDICINE & REHABILITATION PROGRESS NOTE  Subjective/Complaints:  In chair. E-stim running on left wrist extensors--likes it. Asked if he could have one on his left leg. In better spirits  ROS: Patient denies fever, rash, sore throat, blurred vision, nausea, vomiting, diarrhea, cough, shortness of breath or chest pain, joint or back pain, headache, or mood change.   Objective:   No results found. Recent Labs    10/10/19 0547  WBC 7.5  HGB 12.1*  HCT 37.5*  PLT 435*   Recent Labs    10/10/19 0547  NA 142  K 4.5  CL 103  CO2 28  GLUCOSE 107*  BUN 30*  CREATININE 1.47*  CALCIUM 9.9    Intake/Output Summary (Last 24 hours) at 10/11/2019 1147 Last data filed at 10/11/2019 0736 Gross per 24 hour  Intake 155 ml  Output 375 ml  Net -220 ml     Physical Exam: Vital Signs Blood pressure 101/62, pulse (!) 53, temperature 98.3 F (36.8 C), temperature source Oral, resp. rate 16, height 6' (1.829 m), weight 76 kg, SpO2 97 %. Constitutional: No distress . Vital signs reviewed. HEENT: EOMI, oral membranes moist Neck: supple Cardiovascular: RRR without murmur. No JVD    Respiratory/Chest: CTA Bilaterally without wheezes or rales. Normal effort    GI/Abdomen: BS +, non-tender, non-distended Ext: no clubbing, cyanosis, or edema Skin: Warm and dry.  Intact. Psych: flat but engages Musc: No edema in extremities.  No tenderness in extremities. Neurological: Alert Motor: LUE/LE: 0/5 without resting tone---no changes at ll Left central 7 and inattention  Assessment/Plan: 1. Functional deficits secondary to right thalamic hemorrhage which require 3+ hours per day of interdisciplinary therapy in a comprehensive inpatient rehab setting.  Physiatrist is providing close team supervision and 24 hour management of active medical problems listed below.  Physiatrist and rehab team continue to assess barriers to discharge/monitor patient progress toward functional and  medical goals  Care Tool:  Bathing    Body parts bathed by patient: Chest, Abdomen, Front perineal area, Right upper leg, Left upper leg, Face   Body parts bathed by helper: Buttocks, Right arm, Left arm, Right lower leg, Left lower leg     Bathing assist Assist Level: Maximal Assistance - Patient 24 - 49%     Upper Body Dressing/Undressing Upper body dressing   What is the patient wearing?: Pull over shirt    Upper body assist Assist Level: Maximal Assistance - Patient 25 - 49%    Lower Body Dressing/Undressing Lower body dressing      What is the patient wearing?: Underwear/pull up, Pants     Lower body assist Assist for lower body dressing: Maximal Assistance - Patient 25 - 49%     Toileting Toileting Toileting Activity did not occur (Clothing management and hygiene only): N/A (no void or bm)  Toileting assist Assist for toileting: Dependent - Patient 0%     Transfers Chair/bed transfer  Transfers assist     Chair/bed transfer assist level: 2 Helpers     Locomotion Ambulation   Ambulation assist   Ambulation activity did not occur: Safety/medical concerns(Increased R hip pain in standing, pt unable to tolerance standing)  Assist level: 2 helpers Assistive device: (3 muskateers) Max distance: 20 ft   Walk 10 feet activity   Assist  Walk 10 feet activity did not occur: Safety/medical concerns(Increased R hip pain in standing, pt unable to tolerance standing)  Assist level: 2 helpers Assistive device: (3 muskateers)   Walk  50 feet activity   Assist Walk 50 feet with 2 turns activity did not occur: Safety/medical concerns(Increased R hip pain in standing, pt unable to tolerance standing)         Walk 150 feet activity   Assist Walk 150 feet activity did not occur: Safety/medical concerns(Increased R hip pain in standing, pt unable to tolerance standing)         Walk 10 feet on uneven surface  activity   Assist Walk 10 feet on  uneven surfaces activity did not occur: Safety/medical concerns(Increased R hip pain in standing, pt unable to tolerance standing)         Wheelchair     Assist Will patient use wheelchair at discharge?: Yes Type of Wheelchair: Manual    Wheelchair assist level: Supervision/Verbal cueing Max wheelchair distance: 75 ft    Wheelchair 50 feet with 2 turns activity    Assist    Wheelchair 50 feet with 2 turns activity did not occur: Safety/medical concerns(unable without skilled intervention)   Assist Level: Supervision/Verbal cueing   Wheelchair 150 feet activity     Assist  Wheelchair 150 feet activity did not occur: Safety/medical concerns(unable without skilled intervention)   Assist Level: Minimal Assistance - Patient > 75%   Blood pressure 101/62, pulse (!) 53, temperature 98.3 F (36.8 C), temperature source Oral, resp. rate 16, height 6' (1.829 m), weight 76 kg, SpO2 97 %.  Medical Problem List and Plan: 1.Left hemiparesis and visual-spatial deficitssecondary to right thalamic hemorrhage with intraventricular extension  Continue CIR  -WHO,PRAFO for LUE and LLE  -team conference today 2. Antithrombotics:  -DVT/anticoagulation:Mechanical:Sequential compression devices, below kneeBilateral lower extremities   Dopplers negative for DVT -antiplatelet therapy: N/a 3.Headaches/Pain Management:Fioricet or ultram [place]'t helped a great deal.  Topamax increased to 50mg  bid on 3/1  Amitriptyline increased to 50qHS for both headache and insomnia on 3/7.   Will increase Topamax to 75mg  BID for better control of headaches.   K pad ordered for low back pain  3/12: started Depakote for mood stabilization/ headache. Will decrease Amitriptyline given increased lethargy.   3/15 stopped elavil, continue depakote 4. Mood:LCSW to follow for evaluation and support. -antipsychotic agents: N/A  -appreciate neuropsych input  and assistance   -has been voicing feelings of despair related to his diagnosis  3/15 initiated trial of celexa 10mg  qhs 5. Neuropsych: This patientiscapable of making decisions on hisown behalf.   6. Skin/Wound Care:Routine pressure relief measures. 7. Fluids/Electrolytes/Nutrition:Monitor I/Os. Offer nectar liquids between meals to maintain adequate hydration. 8. HTN: Monitor BP tid-continue Hydralazine, Cozaar, Coreg, Catapres and Norvasc. TItrateas indicated--SBP goal<140.    Nanda Quinton has been soft and bradycardic. Will decrease Amlodipine to 5mg  daily.  3/11: Will decrease Amlodipine further to 2.5mg .  3/12: will stop amlodipine.  3/13- bradycardic- decrease losartan to 25 mg BID and monitor- BP soft as well  3/15 improved control. No changes 9. Prediabetes: Hgb A1C- 6.2--was 5.7 eight months ago. Will have RD educate on CM diet--- monitor BS ac/hs with SSI for now.   Mildly elevated on 3/8, 3/10. Continue to monitor.   3/11: has been well controlled; now off daily CBGs and SSI 10. Tobacco/Cannabis use: Reports that he plans on quitting.  11. Dyslipidemia: Statin on hold due to ICH--resume at discharge. 12.  Post stroke dysphagia: doesn't like consistency but is tolerating due to nectars so far with supervision 13.  AKI  Creatinine 1.35 on 3/8  Encourage fluids  IVF to adjust x3 night started on 3/8,  echo reviewed with EF of 55-60%  3/15 BUN/Cr still elevated/increased. Resumed evening IVF fluids  3/16 discussed drinking with patient   -continue with same plan, recheck bmet Thursday 15. Urinary retention: cathing every 8 hours since volumes are low.  3/16 continue urecholine trial, had episode of incontinence today  -timed voids, encourage to empty prior to cath.    LOS: 17 days A FACE TO [place] 10/11/2019, 11:47 AM

## 2019-10-11 NOTE — Progress Notes (Signed)
Speech Language Pathology Daily Session Note  Patient Details  Name: Paul [name] MRN: [id number] Date of Birth: [birth year]  Today's Date: 10/11/2019 SLP Individual Time: 1355-1450 SLP Individual Time Calculation (min): 55 min  Short Term Goals: Week 3: SLP Short Term Goal 1 (Week 3): Pt will consume current diet with minmal overt s/sx aspiration and demonstrate efficient mastication and oral clearance with Supervision A verbal cues for use of swallow strategies. SLP Short Term Goal 2 (Week 3): Pt will consume trials of thin liquids over 3 sessions with minimal overt s/sx aspiration with Min verbal cues to assess readiness for repeat MBSS. SLP Short Term Goal 3 (Week 3): Pt will demonstrate ability to problem solve functional, familiar and basic tasks with Min A verbal/visual cues. SLP Short Term Goal 4 (Week 3): Patient will demonstrate sustained attention to functional tasks for ~30 minutes with Min verbal cues for redirection. SLP Short Term Goal 5 (Week 3): Pt will use speech intelligibility strategies to increase intelligibility to 90% at the sentence level with supervision cues. SLP Short Term Goal 6 (Week 3): Patient will utilize external aids to recall new, daily information with Mod verbal and visual cues.  Skilled Therapeutic Interventions: Skilled treatment session focused on speech and dysphagia goals. SLP attempted to administer testing for respiratory muscle training (RMT) to assess patient's mean inspiratory pressure (MIP) and mean expiratory pressure (MEP). However, due to patient's cognitive deficits and poor frustration tolerance, testing was unable to be completed. Therefore, SLP introduced both EMST and IMST devices. Patient completed 25 repetitions of EMST at 10 cm H2O with Mod verbal cues and a self-perceived effort level of 3/10, however, patient's effort level appeared greater than a 3. Patient also performed 25 repetitions of IMST at 13 cm H2O with Mod verbal cues and use of  nose clips with a self-perceived effort level of 7/10. Patient consumed nectar-thick liquid throughout per his request without overt s/s of aspiration. Patient transferred back to bed at end of session via the [facility]. Patient left supine in bed with alarm on and all needs within reach. Continue with current plan of care.      Pain Pain Assessment Pain Scale: 0-10 Pain Score: 0-No pain Faces Pain Scale: No hurt  Therapy/Group: Individual Therapy  Amoura Ransier 10/11/2019, 2:59 PM

## 2019-10-11 NOTE — Progress Notes (Signed)
Physical Therapy Session Note  Patient Details  Name: Paul [name] MRN: [id number] Date of Birth: 29-Jan-1966  Today's Date: 10/11/2019 PT Individual Time: [id number] and [id number] PT Individual Time Calculation (min): 25 min and 57 min  Short Term Goals: Week 3:  PT Short Term Goal 1 (Week 3): Pt will consistently complete w/c<>bed transfers to L & R with mod assist with min cuing for safety awareness. PT Short Term Goal 2 (Week 3): Pt will complete car transfer with max assist +1. PT Short Term Goal 3 (Week 3): Pt will propel w/c x 50 ft with supervision.  Skilled Therapeutic Interventions/Progress Updates:  Treatment 1: Pt received in w/c & agreeable to tx. Pt requesting to speak with therapist & reports feeling "useless" with therapist providing therapeutic listening & encouragement; MD also made aware of pt's ongoing mood issues. Pt propels w/c room<>gym with R hemi technique & supervision. Pt utilizes kinetron from w/c level with assistance for LLE with focus on LLE NMR and RLE strengthening; unsure if any activation noted in LLE during activity. At end of session pt left in w/c with chair alarm donned & all needs at hand. No c/o pain reported during session.  Treatment 2: Pt received in bed, asleep but easily awakened & agreeable to tx. Therapist dons shoes total assist and provides cuing for BLE compensatory technique for supine>sit with pt completing movement with use of bed rails. Stand pivot transfers to R with min with cuing for technique & overall safety awareness. Therapist donned lite gait harness total assist. Pt stood in lite gait with focus on midline orientation, weight shifting to R, postural and trunk control. Pt continues to push L with R side of body despite cuing and attempts to manually facilitate weight shifting R. Pt performed RLE stepping forwards/backwards with RLE for increased weight bearing through LLE with therapist blocking L knee. Attempted gait x 3 trials in lite gait  with therapist providing total assist for manual facilitation for weight shifting R but great difficulty as pt pushing L, total assist advancing LLE and blocking knee. Pt will frequently become internally distracted and at times sit in harness. Pt easily frustrated with task, requiring encouragement and cuing to attend to activity. Pt returned to w/c and attempted to dynavision but pt reports feeling incontinent of BM so returned to room and brief checked but pt clean. Pt encouraged to describe an accomplishment he was proud of during session to increase mood. Pt left in w/c with chair alarm donned & all needs at hand. Pt c/o unrated soreness in RLE at end of session, rest provided. Lite gait harness also adjusted PRN as pt with c/o discomfort.   Therapy Documentation Precautions:  Precautions Precautions: Fall Precaution Comments: left sided weakness and left lateral lean, SBP <160 Restrictions Weight Bearing Restrictions: No Other Position/Activity Restrictions: L PRAFO and WHO donned at rest   Therapy/Group: Individual Therapy  [name] 10/11/2019, 12:22 PM

## 2019-10-11 NOTE — Progress Notes (Signed)
Social Work [name], male   DOB: July 02, 1966, 54 y.o.   MRN: [id number]    SW met with pt and pt brother Leroy Sea in room to provide updates from team conference, and d/c date remaining 10/21/2019. Pt brother aware SW will follow-up with d/c recommendations.   Loralee Pacas, MSW, [place] Office: [phone] Cell: [phone] Fax: [phone]

## 2019-10-11 NOTE — Progress Notes (Signed)
Occupational Therapy Session Note  Patient Details  Name: Paul [name] MRN: [id number] Date of Birth: 1966-03-04  Today's Date: 10/11/2019 OT Individual Time: [id number] OT Individual Time Calculation (min): 43 min   Short Term Goals: Week 3:  OT Short Term Goal 1 (Week 3): Pt will maintain standing balance with mod A within BADL task OT Short Term Goal 2 (Week 3): Pt will thread L UE through shirt sleeve with no more than min A from OT OT Short Term Goal 3 (Week 3): Pt will complete transfer to weaker L side with no more than mod A  Skilled Therapeutic Interventions/Progress Updates:    Pt greeted semi-reclined in bed and agreeable to OT treatment session. Pt with much brighter affect and better mood this morning than yesterday. Pt still hooked up to IV, so OT had nursing unhook IV so we could shower. Pt needed min A for bed mobility with HOB raised. Stedy used to transfer into shower onto [facility] in shower for more trunk support in sitting. Pt able to stand with Stedy with only CGA. Bathing completed with hand over hand A to integrate L UE into bathing tasks with no activation noted. Pt able to use cutout of seat to wash peri-area and buttocks with OT providing min A for dynamic balance. Dressing completed from wc with mod A overall with pt able to thread R LE into pants today. Min A sit<>stand with L knee block while OT assisted with pulling up pants. OT placed SAEBO e-stim on wrist extensors. SAEBO left on for 60 minutes. OT returned to remove SAEBO with skin intact and no adverse reactions.  Sa[place]o Stim One 330 pulse width 35 Hz pulse rate On 8 sec/ off 8 sec Ramp up/ down 2 sec Symmetrical Biphasic wave form  Max intensity 155mA at 500 Ohm load   Therapy Documentation Precautions:  Precautions Precautions: Fall Precaution Comments: left sided weakness and left lateral lean, SBP <160 Restrictions Weight Bearing Restrictions: No Other Position/Activity Restrictions: L PRAFO and WHO donned  at rest Pain:   denies pain  Therapy/Group: Individual Therapy  Valma Cava 10/11/2019, 8:24 AM

## 2019-10-12 ENCOUNTER — Inpatient Hospital Stay (HOSPITAL_COMMUNITY): Payer: Self-pay | Admitting: Speech Pathology

## 2019-10-12 ENCOUNTER — Inpatient Hospital Stay (HOSPITAL_COMMUNITY): Payer: Self-pay | Admitting: Physical Therapy

## 2019-10-12 ENCOUNTER — Inpatient Hospital Stay (HOSPITAL_COMMUNITY): Payer: Self-pay | Admitting: Occupational Therapy

## 2019-10-12 ENCOUNTER — Encounter (HOSPITAL_COMMUNITY): Payer: Self-pay | Admitting: Psychology

## 2019-10-12 NOTE — Progress Notes (Signed)
Patient hasn't had bowel movement since 3/12 this nurse orderedd prn medication and suppository patient refused. [place]

## 2019-10-12 NOTE — Progress Notes (Signed)
Speech Language Pathology Daily Session Note  Patient Details  Name: Paul [name] MRN: [id number] Date of Birth: [birth year]  Today's Date: 10/12/2019 SLP Individual Time: [id number] SLP Individual Time Calculation (min): 55 min  Short Term Goals: Week 3: SLP Short Term Goal 1 (Week 3): Pt will consume current diet with minmal overt s/sx aspiration and demonstrate efficient mastication and oral clearance with Supervision A verbal cues for use of swallow strategies. SLP Short Term Goal 2 (Week 3): Pt will consume trials of thin liquids over 3 sessions with minimal overt s/sx aspiration with Min verbal cues to assess readiness for repeat MBSS. SLP Short Term Goal 3 (Week 3): Pt will demonstrate ability to problem solve functional, familiar and basic tasks with Min A verbal/visual cues. SLP Short Term Goal 4 (Week 3): Patient will demonstrate sustained attention to functional tasks for ~30 minutes with Min verbal cues for redirection. SLP Short Term Goal 5 (Week 3): Pt will use speech intelligibility strategies to increase intelligibility to 90% at the sentence level with supervision cues. SLP Short Term Goal 6 (Week 3): Patient will utilize external aids to recall new, daily information with Mod verbal and visual cues.  Skilled Therapeutic Interventions: Skilled treatment session focused on speech and dysphagia goals. Patient completed 25 repetitions of EMST at 10 cm H2O with a self-perceived effort level of 5/10 and 25 repetitions of IMST at 13 cm H2O with use of nose clips with a self-perceived effort level of 7/10. Mod verbal cues were needed for accuracy with task. SLP also facilitated session by providing trials of thin liquids via tsp. Patient demonstrated overt coughing in 25% of trials, suspect due to delayed swallow initiation. Recommend repeat MBS tomorrow to assess swallow function and possible upgrade. Patient asking appropriate questions this session about swallowing function. SLP provided  education with use of external aids which appeared to maximize understanding, however, patient will need reinforcement of information due to decreased recall. Patent left upright in wheelchair with alarm on and all needs within reach. Continue with current plan of care.      Pain Pain Assessment Pain Scale: 0-10 Pain Score: 7  Pain Type: Acute pain Pain Location: Head Pain Orientation: Mid Pain Descriptors / Indicators: Aching  Therapy/Group: Individual Therapy  Javi Bollman 10/12/2019, 11:24 AM

## 2019-10-12 NOTE — Progress Notes (Signed)
Occupational Therapy Session Note  Patient Details  Name: Paul [name] MRN: [id number] Date of Birth: 04/13/1966  Today's Date: 10/12/2019 OT Individual Time: 0800-0900 OT Individual Time Calculation (min): 60 min    Short Term Goals: Week 3:  OT Short Term Goal 1 (Week 3): Pt will maintain standing balance with mod A within BADL task OT Short Term Goal 2 (Week 3): Pt will thread L UE through shirt sleeve with no more than min A from OT OT Short Term Goal 3 (Week 3): Pt will complete transfer to weaker L side with no more than mod A  Skilled Therapeutic Interventions/Progress Updates:    Upon entering the room, pt supine in bed with HOB elevated and NT present providing supervision for breakfast. OT taking over supervision for safety. Pt needing mod cuing for small bites and not to talk to therapist while eating. OT limiting distractions in room as well. Pt performed supine >sit with min A to EOB. Figure four position utilized and pt donning B shoes onto feet with mod A for sitting balance. OT assisted pt with tying shoe laces. Sit <>stand from EOB with mod A this session. Mod A squat pivot transfer into wheelchair from bed. OT assisted pt to sink for grooming tasks with supervision and max cuing to sequence task. Pt remained in wheelchair with lap tray donned and chair alarm belt activated. SLP entered the room and pt transitioned easily to her session as OT exited the room.   Therapy Documentation Precautions:  Precautions Precautions: Fall Precaution Comments: left sided weakness and left lateral lean, SBP <160 Restrictions Weight Bearing Restrictions: No Other Position/Activity Restrictions: L PRAFO and WHO donned at rest   ADL: ADL Eating: Not assessed Grooming: Moderate assistance Where Assessed-Grooming: Edge of bed Upper Body Bathing: Moderate assistance Where Assessed-Upper Body Bathing: Edge of bed Lower Body Bathing: Maximal assistance Where Assessed-Lower Body Bathing:  Edge of bed Upper Body Dressing: Maximal assistance Where Assessed-Upper Body Dressing: Edge of bed Lower Body Dressing: Maximal assistance Where Assessed-Lower Body Dressing: Edge of bed Toileting: Not assessed Toilet Transfer: Not assessed Tub/Shower Transfer: Not assessed   Therapy/Group: Individual Therapy  Gypsy Decant 10/12/2019, 4:28 PM

## 2019-10-12 NOTE — Progress Notes (Addendum)
Physical Therapy Weekly Progress Note  Patient Details  Name: Paul [name] MRN: [id number] Date of Birth: Jan 01, 1966  Beginning of progress report period: October 07, 2019 End of progress report period: October 12, 2019  Today's Date: 10/12/2019 PT Individual Time: 1406-1500 PT Individual Time Calculation (min): 54 min   Patient has met 2 of 3 short term goals.  Pt continues to make slow progress towards LTG's. Pt can require as little as min/mod assist for stand pivot transfers to R and up to max<>+2 assist for stand pivot transfers to L 2/2 impaired postural control & awareness, impaired safety awareness & L inattention. Pt can propel w/c with R hemi with as little as supervision in a controlled environment. Pt continues to require min assist and instructional cuing for bed mobility with compensatory techniques. Pt continues to participate in standing/gait during PT sessions to focus on postural control & L NMR but no neuromuscular activation noted in LLE. Pt would benefit from continued skilled PT treatment to focus on safety & increasing independence with bed mobility, transfers, and w/c mobility, as well as for hands on caregiver training prior to pt's d/c.   Patient continues to demonstrate the following deficits muscle weakness, decreased cardiorespiratoy endurance, motor apraxia, decreased coordination and decreased motor planning, decreased visual perceptual skills, decreased attention to left, decreased initiation, decreased attention, decreased awareness, decreased problem solving, decreased safety awareness, decreased memory and delayed processing, and decreased sitting balance, decreased standing balance, decreased postural control, decreased balance strategies and L hemiparesis and therefore will continue to benefit from skilled PT intervention to increase functional independence with mobility.   Patient progressing toward long term goals..  Continue plan of care.  PT Short Term Goals Week  3:  PT Short Term Goal 1 (Week 3): Pt will consistently complete w/c<>bed transfers to L & R with mod assist with min cuing for safety awareness. PT Short Term Goal 1 - Progress (Week 3): Progressing toward goal PT Short Term Goal 2 (Week 3): Pt will complete car transfer with max assist +1. PT Short Term Goal 2 - Progress (Week 3): Met PT Short Term Goal 3 (Week 3): Pt will propel w/c x 50 ft with supervision. PT Short Term Goal 3 - Progress (Week 3): Met Week 4:  PT Short Term Goal 1 (Week 4): STG = LTG due to estimated d/c date.  Skilled Therapeutic Interventions/Progress Updates:  Pt received in bed & agreeable to tx. Therapist dons B tennis shoes total assist for time management. Pt transfers supine>sit with min assist with ongoing cuing to use RLE to assist LLE to EOB, extra time to upright trunk sitting EOB. Pt transfers bed>w/c on R with mod assist with ongoing cuing for improved eccentric control and overall safety awareness. Pt propels w/c around unit with R hemi technique & supervision with min cuing to attend to L side. Pt completes car transfer at [place] simulated height (unable to elevate car seat to simulate SUV or truck height 2/2 broken lift) with max assist to transfer w/c>car on L, mod assist car>w/c on R with cuing for sequencing & safety. Pt propels w/c room<>gift shop with supervision in open hallways but min assist in narrow pathways in gift shop with cuing for technique and turning in small spacious with education re: propelling w/c in community setting. At end of session pt left in w/c with chair alarm donned & all needs at hand.  Addendum: recreational therapist present for session.  Therapy Documentation Precautions:  Precautions Precautions: Fall  Precaution Comments: left sided weakness and left lateral lean, SBP <160 Restrictions Weight Bearing Restrictions: No Other Position/Activity Restrictions: L PRAFO and WHO donned at rest  Pain: 7/10 aching pain in L hip -  rest breaks & repositioning provided PRN during session, requested pain meds from nurse but pt unable to receive any at this time.    Therapy/Group: Individual Therapy  [name] 10/12/2019, 4:11 PM

## 2019-10-12 NOTE — Progress Notes (Signed)
[place] PHYSICAL MEDICINE & REHABILITATION PROGRESS NOTE  Subjective/Complaints:  No new issues. Up in bed this morning. Headaches present but more controlled.  ROS: Patient denies fever, rash, sore throat, blurred vision, nausea, vomiting, diarrhea, cough, shortness of breath or chest pain, joint or back pain, headache, depressed mood    Objective:   No results found. Recent Labs    10/10/19 0547  WBC 7.5  HGB 12.1*  HCT 37.5*  PLT 435*   Recent Labs    10/10/19 0547  NA 142  K 4.5  CL 103  CO2 28  GLUCOSE 107*  BUN 30*  CREATININE 1.47*  CALCIUM 9.9    Intake/Output Summary (Last 24 hours) at 10/12/2019 0842 Last data filed at 10/12/2019 0230 Gross per 24 hour  Intake 245 ml  Output 850 ml  Net -605 ml     Physical Exam: Vital Signs Blood pressure 109/65, pulse (!) 50, temperature 98 F (36.7 C), resp. rate 19, height 6' (1.829 m), weight 76 kg, SpO2 99 %. Constitutional: No distress . Vital signs reviewed. HEENT: EOMI, oral membranes moist Neck: supple Cardiovascular: RRR without murmur. No JVD    Respiratory/Chest: CTA Bilaterally without wheezes or rales. Normal effort    GI/Abdomen: BS +, non-tender, non-distended Ext: no clubbing, cyanosis, or edema Skin: Warm and dry.  Intact. Psych: flat but engages Musc: No edema in extremities.  No tenderness in extremities. Neurological: Alert Motor: LUE/LE: 0/5 Left central 7 and left inattention  Assessment/Plan: 1. Functional deficits secondary to right thalamic hemorrhage which require 3+ hours per day of interdisciplinary therapy in a comprehensive inpatient rehab setting.  Physiatrist is providing close team supervision and 24 hour management of active medical problems listed below.  Physiatrist and rehab team continue to assess barriers to discharge/monitor patient progress toward functional and medical goals  Care Tool:  Bathing    Body parts bathed by patient: Chest, Abdomen, Front perineal  area, Right upper leg, Left upper leg, Face   Body parts bathed by helper: Buttocks, Right arm, Left arm, Right lower leg, Left lower leg     Bathing assist Assist Level: Maximal Assistance - Patient 24 - 49%     Upper Body Dressing/Undressing Upper body dressing   What is the patient wearing?: Pull over shirt    Upper body assist Assist Level: Maximal Assistance - Patient 25 - 49%    Lower Body Dressing/Undressing Lower body dressing      What is the patient wearing?: Underwear/pull up, Pants     Lower body assist Assist for lower body dressing: Maximal Assistance - Patient 25 - 49%     Toileting Toileting Toileting Activity did not occur (Clothing management and hygiene only): N/A (no void or bm)  Toileting assist Assist for toileting: Dependent - Patient 0%     Transfers Chair/bed transfer  Transfers assist     Chair/bed transfer assist level: Minimal Assistance - Patient > 75%(stand pivot to R)     Locomotion Ambulation   Ambulation assist   Ambulation activity did not occur: Safety/medical concerns(Increased R hip pain in standing, pt unable to tolerance standing)  Assist level: 2 helpers Assistive device: (3 muskateers) Max distance: 20 ft   Walk 10 feet activity   Assist  Walk 10 feet activity did not occur: Safety/medical concerns(Increased R hip pain in standing, pt unable to tolerance standing)  Assist level: 2 helpers Assistive device: (3 muskateers)   Walk 50 feet activity   Assist Walk 50 feet with  2 turns activity did not occur: Safety/medical concerns(Increased R hip pain in standing, pt unable to tolerance standing)         Walk 150 feet activity   Assist Walk 150 feet activity did not occur: Safety/medical concerns(Increased R hip pain in standing, pt unable to tolerance standing)         Walk 10 feet on uneven surface  activity   Assist Walk 10 feet on uneven surfaces activity did not occur: Safety/medical  concerns(Increased R hip pain in standing, pt unable to tolerance standing)         Wheelchair     Assist Will patient use wheelchair at discharge?: Yes Type of Wheelchair: Manual    Wheelchair assist level: Supervision/Verbal cueing Max wheelchair distance: 75 ft    Wheelchair 50 feet with 2 turns activity    Assist    Wheelchair 50 feet with 2 turns activity did not occur: Safety/medical concerns(unable without skilled intervention)   Assist Level: Supervision/Verbal cueing   Wheelchair 150 feet activity     Assist  Wheelchair 150 feet activity did not occur: Safety/medical concerns(unable without skilled intervention)   Assist Level: Minimal Assistance - Patient > 75%   Blood pressure 109/65, pulse (!) 50, temperature 98 F (36.7 C), resp. rate 19, height 6' (1.829 m), weight 76 kg, SpO2 99 %.  Medical Problem List and Plan: 1.Left hemiparesis and visual-spatial deficitssecondary to right thalamic hemorrhage with intraventricular extension  Continue CIR  -WHO,PRAFO for LUE and LLE  -working toward dc of 3/26 2. Antithrombotics:  -DVT/anticoagulation:Mechanical:Sequential compression devices, below kneeBilateral lower extremities   Dopplers negative for DVT -antiplatelet therapy: N/a 3.Headaches/Pain Management:Fioricet or ultram [place]'t helped a great deal.  Topamax increased to 50mg  bid on 3/1  Amitriptyline increased to 50qHS for both headache and insomnia on 3/7.   Will increase Topamax to 75mg  BID for better control of headaches.   K pad ordered for low back pain  3/12: started Depakote for mood stabilization/ headache. Will decrease Amitriptyline given increased lethargy.   3/15 stopped elavil, continue depakote  3/17 headaches under reasonable control 4. Mood:LCSW to follow for evaluation and support. -antipsychotic agents: N/A  -appreciate neuropsych input and assistance   -has been voicing  feelings of despair related to his diagnosis  3/15 initiated trial of celexa 10mg  qhs  3/17 team to continue to provide ego support   -may have some cortical contributions to lability/PBA 5. Neuropsych: This patientiscapable of making decisions on hisown behalf.   6. Skin/Wound Care:Routine pressure relief measures. 7. Fluids/Electrolytes/Nutrition:Monitor I/Os. Offer nectar liquids between meals to maintain adequate hydration. 8. HTN: Monitor BP tid-continue Hydralazine, Cozaar, Coreg, Catapres and Norvasc. TItrateas indicated--SBP goal<140.    Nanda Quinton has been soft and bradycardic. Will decrease Amlodipine to 5mg  daily.  3/11: Will decrease Amlodipine further to 2.5mg .  3/12: will stop amlodipine.  3/13- bradycardic- decrease losartan to 25 mg BID and monitor- BP soft as well  3/17 bp tightly controlled 9. Prediabetes: Hgb A1C- 6.2--was 5.7 eight months ago. Will have RD educate on CM diet--- monitor BS ac/hs with SSI for now.   Mildly elevated on 3/8, 3/10. Continue to monitor.   3/11: has been well controlled; now off daily CBGs and SSI 10. Tobacco/Cannabis use: Reports that he plans on quitting.  11. Dyslipidemia: Statin on hold due to ICH--resume at discharge. 12.  Post stroke dysphagia: doesn't like consistency but is tolerating due to nectars so far with supervision 13.  AKI  Creatinine 1.35 on 3/8  Encourage fluids  IVF to adjust x3 night started on 3/8, echo reviewed with EF of 55-60%  3/15 BUN/Cr still elevated/increased. Resumed evening IVF fluids  3/17 continue IVF   -check labs tomorrow 15. Urinary retention: cathing every 8 hours since volumes are low.  3/16 continue urecholine trial, had episode of incontinence today  -timed voids, encourage to empty prior to cath.   3/17 more incontinence with urecholine, follow for ongoing pattern, hesitate increasing as incontinence likely to increase  LOS: 18 days A FACE TO [place] 10/12/2019, 8:42 AM

## 2019-10-12 NOTE — Progress Notes (Signed)
Recreational Therapy Session Note  Patient Details  Name: Paul [name] MRN: [id number] Date of Birth: 1965-09-11 Today's Date: 10/12/2019 Time:  [id number] Pain: no c/o Skilled Therapeutic Interventions/Progress Updates: Session focused on activity tolerance and w/c mobility throughout the hospital.  Given choices of activities, pt chose w/c mobility down to the gift shop.  Pt performed w/c mobility including accessing public elevators, throughout hospital hallways, and in the gift shop with close supervision-min assist and min-mod cues to avoid obstacles on the left.  Pt returned to his new room, chair alarm applied and on, all needs within reach. Therapy/Group: Co-Treatment  Paul [name] 10/12/2019, 3:25 PM

## 2019-10-12 NOTE — Progress Notes (Signed)
Occupational Therapy Session Note  Patient Details  Name: Paul [name] MRN: [id number] Date of Birth: 1966-02-13  Today's Date: 10/12/2019 OT Individual Time: 1100-1125 OT Individual Time Calculation (min): 25 min    Short Term Goals: Week 2:  OT Short Term Goal 1 (Week 2): Pt will complete shower transfer with 1 assist OT Short Term Goal 1 - Progress (Week 2): Met OT Short Term Goal 2 (Week 2): Pt will maintain sitting balance at EOB with no more than Mod A for balance within BADL task OT Short Term Goal 2 - Progress (Week 2): Met OT Short Term Goal 3 (Week 2): Pt will maintain standing balance with mod A within BADL task OT Short Term Goal 3 - Progress (Week 2): Progressing toward goal Week 3:  OT Short Term Goal 1 (Week 3): Pt will maintain standing balance with mod A within BADL task OT Short Term Goal 2 (Week 3): Pt will thread L UE through shirt sleeve with no more than min A from OT OT Short Term Goal 3 (Week 3): Pt will complete transfer to weaker L side with no more than mod A  Skilled Therapeutic Interventions/Progress Updates:    1:1 Pt came to EOB with min A with extra time and sequencing cues for management of left side of body. Participated in self care retraining specially dressing at the EOB. Focus on static to dynamic sitting balance. Cue provided to squish "bug" under his his right hip/ buttocks to maintain sitting balance without UE support and to help recenter body at midline when lose balance with dynamic sitting during dressing. Pt required mod A with method to thread left up first with setup of hole and then head, right Ue and trunk with cues for thoroughness.  Focus on management of left Le during threading brief and pants (crossing it over right LE and then placing it gently back on the floor). Sit to stands with mod A and manual facilitation to maintain position of Left Le in standing position for clothing management.    Returned to supine to rest and left hand splint  applied.   Therapy Documentation Precautions:  Precautions Precautions: Fall Precaution Comments: left sided weakness and left lateral lean, SBP <160 Restrictions Weight Bearing Restrictions: No Other Position/Activity Restrictions: L PRAFO and WHO donned at rest Pain: No c/o pain in session but wanted to remain in bed at end of session due to fatigue. Declined sitting up in w/c   Therapy/Group: Individual Therapy  Willeen Cass [facility] 10/12/2019, 11:25 AM
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
## 2019-10-13 ENCOUNTER — Inpatient Hospital Stay (HOSPITAL_COMMUNITY): Payer: Self-pay | Admitting: Physical Therapy

## 2019-10-13 ENCOUNTER — Encounter (HOSPITAL_COMMUNITY): Payer: Self-pay | Admitting: Speech Pathology

## 2019-10-13 ENCOUNTER — Inpatient Hospital Stay (HOSPITAL_COMMUNITY): Payer: Medicaid Other

## 2019-10-13 ENCOUNTER — Inpatient Hospital Stay (HOSPITAL_COMMUNITY): Payer: Self-pay | Admitting: Occupational Therapy

## 2019-10-13 ENCOUNTER — Inpatient Hospital Stay (HOSPITAL_COMMUNITY): Payer: Self-pay | Admitting: *Deleted

## 2019-10-13 LAB — BASIC METABOLIC PANEL
Anion gap: 10 (ref 5–15)
BUN: 20 mg/dL (ref 6–20)
CO2: 26 mmol/L (ref 22–32)
Calcium: 9.4 mg/dL (ref 8.9–10.3)
Chloride: 103 mmol/L (ref 98–111)
Creatinine, Ser: 1.18 mg/dL (ref 0.61–1.24)
GFR calc Af Amer: >60 mL/min (ref >60)
GFR calc non Af Amer: >60 mL/min (ref >60)
Glucose, Bld: 102 mg/dL — ABNORMAL HIGH (ref 70–99)
Potassium: 4.4 mmol/L (ref 3.5–5.1)
Sodium: 139 mmol/L (ref 135–145)

## 2019-10-13 MED ORDER — BETHANECHOL CHLORIDE 25 MG PO TABS
25.0000 mg | ORAL_TABLET | Freq: Three times a day (TID) | ORAL | Status: DC
  Administered 2019-10-13 – 2019-10-21 (×24): 25 mg via ORAL
  Filled 2019-10-13 (×24): qty 1

## 2019-10-13 NOTE — Progress Notes (Signed)
Occupational Therapy Session Note  Patient Details  Name: Paul [name] MRN: [id number] Date of Birth: [birth year]  Today's Date: 10/13/2019 OT Individual Time: [id number] OT Individual Time Calculation (min): 71 min    Short Term Goals: Week 3:  OT Short Term Goal 1 (Week 3): Pt will maintain standing balance with mod A within BADL task OT Short Term Goal 2 (Week 3): Pt will thread L UE through shirt sleeve with no more than min A from OT OT Short Term Goal 3 (Week 3): Pt will complete transfer to weaker L side with no more than mod A  Skilled Therapeutic Interventions/Progress Updates:    Pt greeted semi-reclined in bed finishing breakfast and agreeable to OT treatment session focused on self-care retraining. Nursing entered to unhook IV so we could shower. Pt came to sitting EOB with HOB elevated and only min A to bring L UE from behind pt. Pt able to hook LLE to bring to EOB. Pt with improved sitting balance and was able to maintain static sitting with supervision/CGA. Stedy transfer into shower onto [facility] in shower with min/CGA to achieve standing w stedy blocking L knee. Bathing completed with OT providing hand over hand A to integrate L UE into bathing tasks. Pt wanted to shave after shower and OT had pt integrate L UE into shaving by placing shaving cream into left hand, then using R hand to put on face. Pt needed OT assist to maintain L hand supination to allow access to shaving cream. Pt able to shave both sides of face without cues. Worked on dressing strategies with pt getting more frustrated with L hand not functioning. LE dressing completed from figure 4 position with mod verbal cues and OT assist to get LLE into figure 4 position. OT placed SAEBO e-stim on shoulder to help with shoulder subluxation. SAEBO left on for 60 minutes. OT returned to remove SAEBO with skin intact and no adverse reactions.  [place] Stim One 330 pulse width 35 Hz pulse rate On 8 sec/ off 8 sec Ramp up/ down 2  sec Symmetrical Biphasic wave form  Max intensity 186mA at 500 Ohm load   Therapy Documentation Precautions:  Precautions Precautions: Fall Precaution Comments: left sided weakness and left lateral lean, SBP <160 Restrictions Weight Bearing Restrictions: No Other Position/Activity Restrictions: L PRAFO and WHO donned at rest General:   Vital Signs:  Pain:  denies pain   Therapy/Group: Individual Therapy  Valma Cava 10/13/2019, 8:26 AM

## 2019-10-13 NOTE — Progress Notes (Signed)
Physical Therapy Session Note  Patient Details  Name: Paul [name] MRN: [id number] Date of Birth: Mar 24, 1966  Today's Date: 10/13/2019 PT Individual Time: 1320-1400 PT Individual Time Calculation (min): 40 min   Short Term Goals: Week 4:  PT Short Term Goal 1 (Week 4): STG = LTG due to estimated d/c date.  Skilled Therapeutic Interventions/Progress Updates:    Patient received in bed, initially declining PT due to 9/10 HA but agreeable to participating in low level session after RN delivered pain medication. Continues to require MinA for supine to sit, then spent a good deal of session playing poker while sitting at EOB and standing in stedy. When sitting able to inconsistently maintain static and dynamic balance with LOB posteriorly and to the L approximately 40% of the time requiring Min-ModA to correct; needed one person MaxA to weight shift for cross midline reaching to handle cards and one person maintain position of  L LE during reaching activities in stedy without B UE support. Seemed very internally distracted this afternoon. Able to perform squat-pivot transfer to the [facility] with [place] and cues for positioning of LEs, then worked on propelling WC approximately 131f with stimulation and cues to improve attention to L with S. Left up in the chair with seatbelt alarm active, brother present, and all other needs met this afternoon.   Therapy Documentation Precautions:  Precautions Precautions: Fall Precaution Comments: left sided weakness and left lateral lean, SBP <160 Restrictions Weight Bearing Restrictions: No Other Position/Activity Restrictions: L PRAFO and WHO donned at rest General: PT Amount of Missed Time (min): 20 Minutes PT Missed Treatment Reason: Pain Pain: Pain Assessment Pain Scale: 0-10 Pain Score: 9  Pain Type: Acute pain Pain Location: Head Pain Orientation: Mid Pain Descriptors / Indicators: Headache Pain Frequency: Intermittent Pain Onset: On-going Patients  Stated Pain Goal: 0 Pain Intervention(s): RN made aware;Medication (See eMAR) Multiple Pain Sites: No    Therapy/Group: Individual Therapy  KWindell Norfolk DPT, PN1   Supplemental Physical Therapist [place]   Pager [phone] Rehab Office [phone]  10/13/2019, 3:35 PM

## 2019-10-13 NOTE — Progress Notes (Signed)
Recreational Therapy Session Note  Patient Details  Name: Paul [name] MRN: [id number] Date of Birth: 1965/12/29 Today's Date: 10/13/2019 Time:  [id number]  Pain: c/o 9-10/10 HA, Nursing made aware and meds given Skilled Therapeutic Interventions/Progress Updates: Session focused on activity tolerance, dynamic sitting balance, dynamic standing balance, visual scanning, problem solving during co-treat with PT.  Pt in bed upon arrival in tears.  Pts brother at bedside.  Pt c/o HA, nursing made aware and medicated pt.  Pt agreeable to sit EOB for tabletop card game.  Pt utilized card holder to play [employer] with his brother. Pt sat EOB with close supervision-min assist for balance during card game.  Brother reviewed rules of game.  Pt required mod cues fading to min cues for problem solving and visual scanning during game play.  Pt agreeable to stand in [place] for 1 hand of cards.  Pt required +2 assist for balance, once for balance and one for L knee control.  Pt requesting to propel w/c around the unit at the end of the session with supervision, min-mod cues for visual scanning.  Pts brother with questions about discharge planning, assistance needed and follow up therapies. Answers provided through chart review.  Therapy/Group: Co-Treatment Lianna Sitzmann 10/13/2019, 3:34 PM

## 2019-10-13 NOTE — Progress Notes (Signed)
Physical Therapy Session Note  Patient Details  Name: Paul [name] MRN: [id number] Date of Birth: 08-Dec-1965  Today's Date: 10/13/2019 PT Individual Time: 1500-1530 PT Individual Time Calculation (min): 30 min   Short Term Goals: Week 4:  PT Short Term Goal 1 (Week 4): STG = LTG due to estimated d/c date.  Skilled Therapeutic Interventions/Progress Updates:    Patient received up in [facility], feeling better with reduced pain and willing to participate in session. Transported him to hallway outside of gym totalA in [facility] for time management, then able to perform sit to stand with MinA at railing in hallway; worked on finding midline and weight shifting to L side with visual feedback, which he requested be removed- "it makes me feel weird"- statically, then progressed to taking baby steps with MaxA for weight shifting and progression of L LE. Able to take 4 steps before needed to sit down for safety reasons due to difficulty with control/positioning of L LE, also demonstrated moderate buckling of L LE during stance phase requiring Mod-maxA at L knee for safety. Otherwise continued working on [facility] navigation with cues for improved attention on L side, also cues for safety given tendency to start pushing WC extremely fast. He was left up in the chair with all needs met, seatbelt alarm active and brother present this afternoon.   Therapy Documentation Precautions:  Precautions Precautions: Fall Precaution Comments: left sided weakness and left lateral lean, SBP <160 Restrictions Weight Bearing Restrictions: No Other Position/Activity Restrictions: L PRAFO and WHO donned at rest  Pain: Pain Assessment Pain Scale: 0-10 Pain Score: 3 Monitored during session/increased activity      Therapy/Group: Individual Therapy   Windell Norfolk, DPT, PN1   Supplemental Physical Therapist [place]    Pager [phone] Acute Rehab Office [phone]    10/13/2019, 3:41 PM

## 2019-10-13 NOTE — Progress Notes (Signed)
[place] PHYSICAL MEDICINE & REHABILITATION PROGRESS NOTE  Subjective/Complaints:  Up in w/c. Frustrated by lack of progress. Feels some of staff are not tending to his questions or needs. Asked [place] if he's every directly asked a caregiver when he needs something, and he said "no"  ROS: Patient denies fever, rash, sore throat, blurred vision, nausea, vomiting, diarrhea, cough, shortness of breath or chest pain, joint or back pain,  or mood change.   Objective:   No results found. No results for input(s): WBC, HGB, HCT, PLT in the last 72 hours. Recent Labs    10/13/19 0545  NA 139  K 4.4  CL 103  CO2 26  GLUCOSE 102*  BUN 20  CREATININE 1.18  CALCIUM 9.4    Intake/Output Summary (Last 24 hours) at 10/13/2019 1023 Last data filed at 10/13/2019 0700 Gross per 24 hour  Intake 320 ml  Output 625 ml  Net -305 ml     Physical Exam: Vital Signs Blood pressure 121/73, pulse (!) 46, temperature 97.7 F (36.5 C), temperature source Oral, resp. rate 19, height 6' (1.829 m), weight 76 kg, SpO2 99 %. Constitutional: No distress . Vital signs reviewed. HEENT: EOMI, oral membranes moist Neck: supple Cardiovascular: RRR without murmur. No JVD    Respiratory/Chest: CTA Bilaterally without wheezes or rales. Normal effort    GI/Abdomen: BS +, non-tender, non-distended Ext: no clubbing, cyanosis, or edema Skin: Warm and dry.  Intact. Psych: flat, appears frustrated Musc: No edema in extremities.  No tenderness in extremities. Neurological: Alert Motor: LUE/LE: 0/5 except for tr-1/5 HE Left central 7 and left inattention ongoing  Assessment/Plan: 1. Functional deficits secondary to right thalamic hemorrhage which require 3+ hours per day of interdisciplinary therapy in a comprehensive inpatient rehab setting.  Physiatrist is providing close team supervision and 24 hour management of active medical problems listed below.  Physiatrist and rehab team continue to assess barriers to  discharge/monitor patient progress toward functional and medical goals  Care Tool:  Bathing    Body parts bathed by patient: Chest, Abdomen, Front perineal area, Right upper leg, Left upper leg, Face   Body parts bathed by helper: Buttocks, Right arm, Left arm, Right lower leg, Left lower leg     Bathing assist Assist Level: Maximal Assistance - Patient 24 - 49%     Upper Body Dressing/Undressing Upper body dressing   What is the patient wearing?: Pull over shirt    Upper body assist Assist Level: Moderate Assistance - Patient 50 - 74%    Lower Body Dressing/Undressing Lower body dressing      What is the patient wearing?: Pants     Lower body assist Assist for lower body dressing: Moderate Assistance - Patient 50 - 74%     Toileting Toileting Toileting Activity did not occur (Clothing management and hygiene only): N/A (no void or bm)  Toileting assist Assist for toileting: Dependent - Patient 0%     Transfers Chair/bed transfer  Transfers assist     Chair/bed transfer assist level: Moderate Assistance - Patient 50 - 74%     Locomotion Ambulation   Ambulation assist   Ambulation activity did not occur: Safety/medical concerns(Increased R hip pain in standing, pt unable to tolerance standing)  Assist level: 2 helpers Assistive device: (3 muskateers) Max distance: 20 ft   Walk 10 feet activity   Assist  Walk 10 feet activity did not occur: Safety/medical concerns(Increased R hip pain in standing, pt unable to tolerance standing)  Assist  level: 2 helpers Assistive device: (3 muskateers)   Walk 50 feet activity   Assist Walk 50 feet with 2 turns activity did not occur: Safety/medical concerns(Increased R hip pain in standing, pt unable to tolerance standing)         Walk 150 feet activity   Assist Walk 150 feet activity did not occur: Safety/medical concerns(Increased R hip pain in standing, pt unable to tolerance standing)         Walk  10 feet on uneven surface  activity   Assist Walk 10 feet on uneven surfaces activity did not occur: Safety/medical concerns(Increased R hip pain in standing, pt unable to tolerance standing)         Wheelchair     Assist Will patient use wheelchair at discharge?: Yes Type of Wheelchair: Manual    Wheelchair assist level: Supervision/Verbal cueing Max wheelchair distance: 150 ft    Wheelchair 50 feet with 2 turns activity    Assist    Wheelchair 50 feet with 2 turns activity did not occur: Safety/medical concerns(unable without skilled intervention)   Assist Level: Supervision/Verbal cueing   Wheelchair 150 feet activity     Assist  Wheelchair 150 feet activity did not occur: Safety/medical concerns(unable without skilled intervention)   Assist Level: Supervision/Verbal cueing   Blood pressure 121/73, pulse (!) 46, temperature 97.7 F (36.5 C), temperature source Oral, resp. rate 19, height 6' (1.829 m), weight 76 kg, SpO2 99 %.  Medical Problem List and Plan: 1.Left hemiparesis and visual-spatial deficitssecondary to right thalamic hemorrhage with intraventricular extension  Continue CIR  -WHO,PRAFO for LUE and LLE  -working toward dc of 3/26  -encouraged him to ask staff questions whenever he has a need that he feels is unmet 2. Antithrombotics:  -DVT/anticoagulation:Mechanical:Sequential compression devices, below kneeBilateral lower extremities   Dopplers negative for DVT -antiplatelet therapy: N/a 3.Headaches/Pain Management:Fioricet or ultram [place]'t helped a great deal.  Topamax increased to 50mg  bid on 3/1  Amitriptyline increased to 50qHS for both headache and insomnia on 3/7.   Will increase Topamax to 75mg  BID for better control of headaches.   K pad ordered for low back pain  3/12: started Depakote for mood stabilization/ headache. Will decrease Amitriptyline given increased lethargy.   3/15 stopped  elavil, continue depakote  3/18 headaches under reasonable control 4. Mood:LCSW to follow for evaluation and support. -antipsychotic agents: N/A  -appreciate neuropsych input and assistance   -has been voicing feelings of despair related to his diagnosis  3/15 initiated trial of celexa 10mg  qhs  3/17 team to continue to provide ego support   -may have some cortical contributions to lability/PBA 5. Neuropsych: This patientiscapable of making decisions on hisown behalf.   6. Skin/Wound Care:Routine pressure relief measures. 7. Fluids/Electrolytes/Nutrition:Monitor I/Os. Offer nectar liquids between meals to maintain adequate hydration. 8. HTN: Monitor BP tid-continue Hydralazine, Cozaar, Coreg, Catapres and Norvasc. TItrateas indicated--SBP goal<140.    Nanda Quinton has been soft and bradycardic. Will decrease Amlodipine to 5mg  daily.  3/11: Will decrease Amlodipine further to 2.5mg .  3/12: will stop amlodipine.  3/13- bradycardic- decrease losartan to 25 mg BID and monitor- BP soft as well  3/17 bp tightly controlled 9. Prediabetes: Hgb A1C- 6.2--was 5.7 eight months ago. Will have RD educate on CM diet--- monitor BS ac/hs with SSI for now.   Mildly elevated on 3/8, 3/10. Continue to monitor.   3/11: has been well controlled; now off daily CBGs and SSI 10. Tobacco/Cannabis use: Reports that he plans on quitting.  11. Dyslipidemia: Statin on hold due to ICH--resume at discharge. 12.  Post stroke dysphagia: doesn't like consistency but is tolerating due to nectars so far with supervision  -f/u MBS per SLP 13.  AKI  Creatinine 1.35 on 3/8  Encourage fluids  IVF to adjust x3 night started on 3/8, echo reviewed with EF of 55-60%  3/15 BUN/Cr still elevated/increased. Resumed evening IVF fluids  3/17 continue IVF  3/18 labs better today. Dc ivf for now,    -advancing liquids should help 15. Urinary retention: cathing every 8 hours. Volumes have been  inconsistent  3/16 continue urecholine trial, had episode of incontinence today  -timed voids, encourage to empty prior to cath.   3/18- increase urecholine to 25mg   LOS: 19 days A FACE TO FACE EVALUATION WAS PERFORMED  Meredith Staggers 10/13/2019, 10:23 AM

## 2019-10-13 NOTE — Progress Notes (Signed)
Modified Barium Swallow Progress Note  Patient Details  Name: Paul [name] MRN: [id number] Date of Birth: 27-Nov-1965  Today's Date: 10/13/2019  Modified Barium Swallow completed.  Full report located under Chart Review in the Imaging Section.  Brief recommendations include the following:  Clinical Impression  Patient demonstrates a moderate oropharyngeal dysphagia secondary to sensorimotor deficits. Oral phase is characterized by reduced strength resulting in decreased lip closure and bolus cohesion. Pharyngeal phase is characterized by inconsistent timing with triggering of swallow with decreased laryngeal vestibule closure resulting in mostly silent aspiration of thin liquids. Cued cough was weak and ineffective in clearing aspirates.  Multiple compensatory strategies were ineffective in eliminating or reducing aspiration.  Suspect patient's overall cognitive impairments also impacted patient' swallowing function.  Recommend patient remain on current diet of Dys. 2 textures with nectar-thick liquids with the water protocol via tsp.  Patient verbalized understanding and agreement.   Swallow Evaluation Recommendations       SLP Diet Recommendations: Nectar thick liquid;Dysphagia 2 (Fine chop) solids   Liquid Administration via: Cup   Medication Administration: Whole meds with puree   Supervision: Full assist for feeding;Patient able to self feed   Compensations: Minimize environmental distractions;Slow rate;Small sips/bites;Lingual sweep for clearance of pocketing   Postural Changes: Seated upright at 90 degrees   Oral Care Recommendations: Oral care BID   Other Recommendations: Have oral suction available;Prohibited food (jello, ice cream, thin soups)    [name] 10/13/2019,3:32 PM

## 2019-10-14 ENCOUNTER — Inpatient Hospital Stay (HOSPITAL_COMMUNITY): Payer: Self-pay | Admitting: Speech Pathology

## 2019-10-14 ENCOUNTER — Inpatient Hospital Stay (HOSPITAL_COMMUNITY): Payer: Self-pay

## 2019-10-14 ENCOUNTER — Inpatient Hospital Stay (HOSPITAL_COMMUNITY): Payer: Self-pay | Admitting: Occupational Therapy

## 2019-10-14 LAB — GLUCOSE, CAPILLARY: Glucose-Capillary: 109 mg/dL — ABNORMAL HIGH (ref 70–99)

## 2019-10-14 NOTE — Progress Notes (Signed)
Speech Language Pathology Daily Session Note  Patient Details  Name: Paul [name] MRN: [id number] Date of Birth: 02-11-1966  Today's Date: 10/14/2019 SLP Individual Time: [id number] SLP Individual Time Calculation (min): 45 min  Short Term Goals: Week 3: SLP Short Term Goal 1 (Week 3): Pt will consume current diet with minmal overt s/sx aspiration and demonstrate efficient mastication and oral clearance with Supervision A verbal cues for use of swallow strategies. SLP Short Term Goal 2 (Week 3): Pt will consume trials of thin liquids over 3 sessions with minimal overt s/sx aspiration with Min verbal cues to assess readiness for repeat MBSS. SLP Short Term Goal 3 (Week 3): Pt will demonstrate ability to problem solve functional, familiar and basic tasks with Min A verbal/visual cues. SLP Short Term Goal 4 (Week 3): Patient will demonstrate sustained attention to functional tasks for ~30 minutes with Min verbal cues for redirection. SLP Short Term Goal 5 (Week 3): Pt will use speech intelligibility strategies to increase intelligibility to 90% at the sentence level with supervision cues. SLP Short Term Goal 6 (Week 3): Patient will utilize external aids to recall new, daily information with Mod verbal and visual cues.  Skilled Therapeutic Interventions:  Skilled treatment session targeted education with pt and his brother on results of MBS, rationale for continuing current diet. All questions were answered to their satisfaction. SLP further facilitated session by providing Min A cues to repeat statements during verbal description tasks. When prompted for increasing vocal intensity and precise articulation, pt's speech intelligibility increased to ~ 75% at the connected sentence level (~ 2 sentences per description). Additionally, pt was not able to recall that he had previously been given pain medicine. Pt left upright in wheelchair with brother and NT present. Pt wanted to go back to bed. Continue per  current plan of care.      Pain Pain Assessment Pain Scale: 0-10 Pain Score: 6  Pain Type: Acute pain Pain Location: Shoulder Pain Orientation: Left Pain Descriptors / Indicators: Aching Pain Frequency: Intermittent Pain Onset: On-going Patients Stated Pain Goal: 2 Pain Intervention(s): Medication (See eMAR)  Therapy/Group: Individual Therapy  Paul [name] 10/14/2019, 3:30 PM

## 2019-10-14 NOTE — Progress Notes (Signed)
Physical Therapy Session Note  Patient Details  Name: Paul [name] MRN: [id number] Date of Birth: 08-22-1965  Today's Date: 10/14/2019 PT Individual Time: [id number] PT Individual Time Calculation (min): 60 min   Short Term Goals: Week 4:  PT Short Term Goal 1 (Week 4): STG = LTG due to estimated d/c date.  Skilled Therapeutic Interventions/Progress Updates:   Pt reporting need to have BM. +2 for stand pivot and clothing management for safety w/c <> elevated toilet seat with focus on slowed movement and cues for hand placement and attention/awareness due to impaired postural control and L hemi. Pt unable to void. RN administered miralax. Pt requires cues for safe swallowing precautions. Attempted to leave room for therapy session and pt reports wanting to use bathroom again. Performed transfer as described above and no void. Pt self propelled w/c x 150' with supervision for cues for safety and attention to L. Pt goal to work on LUE during session. Focused on NMR for LUE movement while in standing at table top to also address functional dynamic standing balance and postural control re-training. Pt performs sit <> stands with overall min assist when positioned correctly requiring max cues for safe set up and foot placement (requires assist). In standing balance ranges from min to max assist due to decreased postural control or awareness. Total assist to block L knee in standing. Active assisted motion in gravity eliminated positions for movement through shoulder and elbow with pillow case to decrease resistance. Also performed static weightbearing with support through L hand and L forearm with focus on transitional movement. NMR during gait training x 50' with 3 muskateers style with total assist for advancement of LLE but was able to demonstrate stance control on L without hyperextension at times. Facilitation needed for appropriate weightshift and cues for timing and sequencing. Pt very pleased with  progress during session.   Therapy Documentation Precautions:  Precautions Precautions: Fall Precaution Comments: left sided weakness and left lateral lean, SBP <160 Restrictions Weight Bearing Restrictions: No Other Position/Activity Restrictions: L PRAFO and WHO donned at rest   Pain: Pain Assessment Pain Scale: 0-10 Pain Score: 0-No pain Pain Type: Acute pain Pain Location: Head Pain Orientation: Mid Pain Descriptors / Indicators: Headache Pain Frequency: Intermittent Pain Onset: Gradual Patients Stated Pain Goal: 0 Pain Intervention(s): Medication (See eMAR)    Therapy/Group: Individual Therapy  Canary Brim Ivory Broad, PT, DPT, CBIS  10/14/2019, 12:04 PM

## 2019-10-14 NOTE — Progress Notes (Signed)
Physical Therapy Session Note  Patient Details  Name: Paul [name] MRN: [id number] Date of Birth: 04/10/1966  Today's Date: 10/14/2019 PT Individual Time: 0904-1001 PT Individual Time Calculation (min): 57 min   Short Term Goals:  Week 4:  PT Short Term Goal 1 (Week 4): STG = LTG due to estimated d/c date.  Skilled Therapeutic Interventions/Progress Updates:  Pt received in bed & agreeable to tx. Therapist dons B tennis shoes dependent assist. Pt transfers supine<>sit from bed and mat table with min assist for LUE with cuing for compensatory technique to hook LLE with RLE. Stand pivot transfers to L & R with mod assist with therapist instructing pt in squat pivot but pt continuing to stand fully upright. Pt requires cuing & assistance for eccentric lowering. Attempted to assist pt from prone on mat to tall kneeling on bench but unable even with +3 assist to lift trunk & hips so pt returned to w/c & assisted with placing BLE on to mat table in tall kneeling with bench with mirror for visual feedback for midline orientation & upright posture with pt pushing to L with RUE and therapist attempting to manually facilitate weight shifting to R for more midline orientation; pt requires rest breaks onto bench 2/2 fatigue. Pt reports need to use restroom so assisted back to room via w/c dependent assist for time management. Stand pivot w/c<>toilet with use of grab bar & cuing for sequencing with 2nd person managing clothing. Pt given extra time on toilet but unable to void or have BM. Pt performs hand hygiene at sink from w/c level with assistance to elevate & maintain LUE on sink. Gait x 20 ft with 3 muskateers assist with cuing for stepping pattern, total assist for advancing LLE & blocking L knee to prevent buckling. Pt with good midline orientation for first 10 ft then begins pushing L for 2nd half of gait distance despite therapist attempting to manually facilitate weight shifting R to allow increased ease of  LLE foot advancement. At end of session pt left in w/c with chair alarm donned, call bell in reach.  Therapy Documentation Precautions:  Precautions Precautions: Fall Precaution Comments: left sided weakness and left lateral lean, SBP <160 Restrictions Weight Bearing Restrictions: No Other Position/Activity Restrictions: L PRAFO and WHO donned at rest  Pain: Pt c/o unrated soreness in R side - rest breaks provided PRN.  Therapy/Group: Individual Therapy  [name] 10/14/2019, 12:36 PM

## 2019-10-14 NOTE — Progress Notes (Signed)
Occupational Therapy Session Note  Patient Details  Name: Paul [name] MRN: [id number] Date of Birth: Oct 16, 1965  Today's Date: 10/14/2019 OT Individual Time: [id number] OT Individual Time Calculation (min): 45 min    Short Term Goals: Week 3:  OT Short Term Goal 1 (Week 3): Pt will maintain standing balance with mod A within BADL task OT Short Term Goal 2 (Week 3): Pt will thread L UE through shirt sleeve with no more than min A from OT OT Short Term Goal 3 (Week 3): Pt will complete transfer to weaker L side with no more than mod A  Skilled Therapeutic Interventions/Progress Updates:    Pt greeted sitting in wc awaiting lunch. OT provided pt with lunch tray. Pt declined to eat any of the food on the tray except for his magic cup. OT educated on use of L hand as a stabilizer to stabilize magic cup when scooping bites. Pt doing well overall with self-feeding with assistance just for opening containers. OT placed SAEBO e-stim on wrist extensors. SAEBO left on for 60 minutes. OT returned to remove SAEBO with skin intact and no adverse reactions.  Saebo Stim One 330 pulse width 35 Hz pulse rate On 8 sec/ off 8 sec Ramp up/ down 2 sec Symmetrical Biphasic wave form  Max intensity 177mA at 500 Ohm load   Therapy Documentation Precautions:  Precautions Precautions: Fall Precaution Comments: left sided weakness and left lateral lean, SBP <160 Restrictions Weight Bearing Restrictions: No Other Position/Activity Restrictions: L PRAFO and WHO donned at rest Pain: Pain Assessment Pain Scale: 0-10 Pain Score: 0-No pain    Therapy/Group: Individual Therapy  Valma Cava 10/14/2019, 12:25 PM
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
## 2019-10-15 NOTE — Progress Notes (Signed)
[place] PHYSICAL MEDICINE & REHABILITATION PROGRESS NOTE  Subjective/Complaints:  In bed. No new issues. Slept fairly well. Headache seems better  ROS: Patient denies CP, SOB, N/V/D Objective:   No results found. No results for input(s): WBC, HGB, HCT, PLT in the last 72 hours. Recent Labs    10/13/19 0545  NA 139  K 4.4  CL 103  CO2 26  GLUCOSE 102*  BUN 20  CREATININE 1.18  CALCIUM 9.4    Intake/Output Summary (Last 24 hours) at 10/15/2019 1043 Last data filed at 10/15/2019 0441 Gross per 24 hour  Intake 240 ml  Output 425 ml  Net -185 ml     Physical Exam: Vital Signs Blood pressure 122/70, pulse (!) 52, temperature 97.8 F (36.6 C), temperature source Oral, resp. rate 16, height 6' (1.829 m), weight 76 kg, SpO2 99 %.  General: No acute distress Mood and affect are appropriate Heart: Regular rate and rhythm no rubs murmurs or extra sounds Lungs: Clear to auscultation, breathing unlabored, no rales or wheezes Abdomen: Positive bowel sounds, soft nontender to palpation, nondistended Extremities: No clubbing, cyanosis, or edema Skin: No evidence of breakdown, no evidence of rash Neurologic: Cranial nerves II through XII intact, motor strength is 5/5 in right 0/5 left  deltoid, bicep, tricep, grip, hip flexor, knee extensors, ankle dorsiflexor and plantar flexor Sensory exam absent LUE and LLE Cerebellar exam normal finger to nose to finger as well as heel to shin in bilateral upper and lower extremities Musculoskeletal:No joint swelling Left central 7 and left inattention persistent  Assessment/Plan: 1. Functional deficits secondary to right thalamic hemorrhage which require 3+ hours per day of interdisciplinary therapy in a comprehensive inpatient rehab setting.  Physiatrist is providing close team supervision and 24 hour management of active medical problems listed below.  Physiatrist and rehab team continue to assess barriers to discharge/monitor patient  progress toward functional and medical goals  Care Tool:  Bathing    Body parts bathed by patient: Chest, Abdomen, Front perineal area, Right upper leg, Left upper leg, Face   Body parts bathed by helper: Buttocks, Right arm, Left arm, Right lower leg, Left lower leg     Bathing assist Assist Level: Maximal Assistance - Patient 24 - 49%     Upper Body Dressing/Undressing Upper body dressing   What is the patient wearing?: Pull over shirt    Upper body assist Assist Level: Moderate Assistance - Patient 50 - 74%    Lower Body Dressing/Undressing Lower body dressing      What is the patient wearing?: Pants     Lower body assist Assist for lower body dressing: Moderate Assistance - Patient 50 - 74%     Toileting Toileting Toileting Activity did not occur (Clothing management and hygiene only): N/A (no void or bm)  Toileting assist Assist for toileting: Dependent - Patient 0%     Transfers Chair/bed transfer  Transfers assist     Chair/bed transfer assist level: Moderate Assistance - Patient 50 - 74%     Locomotion Ambulation   Ambulation assist   Ambulation activity did not occur: Safety/medical concerns(Increased R hip pain in standing, pt unable to tolerance standing)  Assist level: 2 helpers Assistive device: (3 muskateers) Max distance: 20 ft   Walk 10 feet activity   Assist  Walk 10 feet activity did not occur: Safety/medical concerns(Increased R hip pain in standing, pt unable to tolerance standing)  Assist level: 2 helpers Assistive device: (3 muskateers)   Walk 50  feet activity   Assist Walk 50 feet with 2 turns activity did not occur: Safety/medical concerns(Increased R hip pain in standing, pt unable to tolerance standing)  Assist level: 2 helpers Assistive device: No Device    Walk 150 feet activity   Assist Walk 150 feet activity did not occur: Safety/medical concerns(Increased R hip pain in standing, pt unable to tolerance  standing)         Walk 10 feet on uneven surface  activity   Assist Walk 10 feet on uneven surfaces activity did not occur: Safety/medical concerns(Increased R hip pain in standing, pt unable to tolerance standing)         Wheelchair     Assist Will patient use wheelchair at discharge?: Yes Type of Wheelchair: Manual    Wheelchair assist level: Supervision/Verbal cueing Max wheelchair distance: 150 ft    Wheelchair 50 feet with 2 turns activity    Assist    Wheelchair 50 feet with 2 turns activity did not occur: Safety/medical concerns(unable without skilled intervention)   Assist Level: Supervision/Verbal cueing   Wheelchair 150 feet activity     Assist  Wheelchair 150 feet activity did not occur: Safety/medical concerns(unable without skilled intervention)   Assist Level: Supervision/Verbal cueing   Blood pressure 122/70, pulse (!) 52, temperature 97.8 F (36.6 C), temperature source Oral, resp. rate 16, height 6' (1.829 m), weight 76 kg, SpO2 99 %.  Medical Problem List and Plan: 1.Left hemiparesis and visual-spatial deficitssecondary to right thalamic hemorrhage with intraventricular extension  Continue CIR  -WHO,PRAFO for LUE and LLE  -working toward dc of 3/26 2. Antithrombotics:  -DVT/anticoagulation:Mechanical:Sequential compression devices, below kneeBilateral lower extremities   Dopplers negative for DVT -antiplatelet therapy: N/a 3.Headaches/Pain Management:Fioricet or ultram prnhaven't helped a great deal.    Depakote 125mg  BID  Topamax  75mg  BID     3/120eadaches under reasonable control 4. Mood:LCSW to follow for evaluation and support. -antipsychotic agents: N/A  -appreciate neuropsych input and assistance   -has been voicing feelings of despair related to his diagnosis  3/15 initiated trial of celexa 10mg  qhs  3/17 team to continue to provide ego support   -may have some cortical  contributions to lability/PBA 5. Neuropsych: This patientiscapable of making decisions on hisown behalf.   6. Skin/Wound Care:Routine pressure relief measures. 7. Fluids/Electrolytes/Nutrition:Monitor I/Os. Offer nectar liquids between meals to maintain adequate hydration. 8. HTN: Monitor BP tid-continue Hydralazine, Cozaar, Coreg, Catapres and Norvasc. TItrateas indicated--SBP goal<140.     Nanda Quinton has been soft and bradycardic. Will decrease Amlodipine to 5mg  daily.  3/11: Will decrease Amlodipine further to 2.5mg .  3/12: will stop amlodipine.  3/13- bradycardic- decrease losartan to 25 mg BID and monitor- BP soft as well   Vitals:   10/15/19 0439 10/15/19 0819  BP: 120/81 122/70  Pulse: (!) 52   Resp: 16   Temp: 97.8 F (36.6 C)   SpO2: 99%    9. Prediabetes: Hgb A1C- 6.2--was 5.7 eight months ago. Will have RD educate on CM diet---      has been well controlled; now off daily CBGs and SSI 10. Tobacco/Cannabis use: Reports that he plans on quitting.  11. Dyslipidemia: Statin on hold due to ICH--resume at discharge. 12.  Post stroke dysphagia: doesn't like consistency but is tolerating due to nectars so far with supervision  -f/u MBS per SLP  3/18 persistent oropharyngeal dysphagia--no change in diet D2/nectars 13.  AKI  Creatinine 1.18 on 3/18  Encourage fluids  Off IVF for now  3/19 recheck labs monday 15. Urinary retention: resolved   Last cath ~36hr ago, voiding well with PVR 0-65ml LOS: 21 days A FACE TO FACE EVALUATION WAS PERFORMED  Charlett Blake 10/15/2019, 10:43 AM

## 2019-10-16 ENCOUNTER — Inpatient Hospital Stay (HOSPITAL_COMMUNITY): Payer: Self-pay | Admitting: Occupational Therapy

## 2019-10-16 ENCOUNTER — Inpatient Hospital Stay (HOSPITAL_COMMUNITY): Payer: Self-pay

## 2019-10-16 MED ORDER — TOPIRAMATE 100 MG PO TABS
100.0000 mg | ORAL_TABLET | Freq: Two times a day (BID) | ORAL | Status: DC
  Administered 2019-10-16 – 2019-10-21 (×10): 100 mg via ORAL
  Filled 2019-10-16 (×10): qty 1

## 2019-10-16 NOTE — Progress Notes (Signed)
Physical Therapy Session Note  Patient Details  Name: Paul [name] MRN: [id number] Date of Birth: 02-21-1966  Today's Date: 10/16/2019 PT Individual Time: 4496-7591 PT Individual Time Calculation (min): 33 min    Short Term Goals: Week 2:  PT Short Term Goal 1 (Week 2): Pt will propel w/c 75 ft with mod assist. PT Short Term Goal 1 - Progress (Week 2): Met PT Short Term Goal 2 (Week 2): Pt will consistently complete transfers to L with mod assist. PT Short Term Goal 2 - Progress (Week 2): Progressing toward goal PT Short Term Goal 3 (Week 2): Pt will demonstrate standing balance with mod assist x 3 minutes. PT Short Term Goal 3 - Progress (Week 2): Progressing toward goal  Skilled Therapeutic Interventions/Progress Updates:    Patient seated in w/c upon PT arrival, agreeable to therapy tx and denies pain. Pt transported to the gym in chair. Squat pivot to the mat with Min assist, cues for techniques. Pt performed sit<>stands x6 throughout session with min-mod assist, cues for techniques and pushing up with R UE over L UE over L knee to increase L weight bearing and attention. In standing pt initially with left lean but able to correct and maintain static standing in midline min assist with therapist only blocking L knee. Pt worked on dynamic standing balance and L weight bearing / stance control to perform stepping in place with R LE, no UE support- Pt performed x10 steps forward/back RLE and then x12 lateral steps in place with R LE. Therapist donned shoes and L GRAFO this session for gait training. Pt ambulated x45 ft this session with mod-max assist +2 using 3 musketeers technique for upper body support, during gait therapist blocking L knee during stance, pt with glute activation noted during L stance and able to maintain hip extension, therapist providing L LE advancement and placement during swing, cues for sequencing throughout. Pt transported back to room and performed squat pivot to bed CGA.  Sit>supine CGA. Doffed shoes/socks. Pt performed x5 bridges with R LE more extended to facilitate increased LLE use, assist for placement. Pt left supine in bed at end of session with needs in reach and bed alarm set.    Therapy Documentation Precautions:  Precautions Precautions: Fall Precaution Comments: left sided weakness and left lateral lean, SBP <160 Restrictions Weight Bearing Restrictions: No Other Position/Activity Restrictions: L PRAFO and WHO donned at rest    Therapy/Group: Individual Therapy  Netta Corrigan, PT, DPT, CSRS 10/16/2019, 1:57 PM

## 2019-10-16 NOTE — Progress Notes (Signed)
[facility] PHYSICAL MEDICINE & REHABILITATION PROGRESS NOTE  Subjective/Complaints:  Poor sleep last noc d/t HA  ROS: Patient denies CP, SOB, N/V/D Objective:   No results found. No results for input(s): WBC, HGB, HCT, PLT in the last 72 hours. No results for input(s): NA, K, CL, CO2, GLUCOSE, BUN, CREATININE, CALCIUM in the last 72 hours.  Intake/Output Summary (Last 24 hours) at 10/16/2019 1045 Last data filed at 10/16/2019 0458 Gross per 24 hour  Intake 120 ml  Output 700 ml  Net -580 ml     Physical Exam: Vital Signs Blood pressure 134/77, pulse (!) 48, temperature 98 F (36.7 C), temperature source Oral, resp. rate 16, height 6' (1.829 m), weight 76 kg, SpO2 99 %.   General: No acute distress Mood and affect are appropriate Heart: Regular rate and rhythm no rubs murmurs or extra sounds Lungs: Clear to auscultation, breathing unlabored, no rales or wheezes Abdomen: Positive bowel sounds, soft nontender to palpation, nondistended Extremities: No clubbing, cyanosis, or edema Skin: No evidence of breakdown, no evidence of rash Neurologic: 0/5 LUE dn LLE, absent sensation LUE and LLE  Musculoskeletal: Full range of motion in all 4 extremities. No joint swelling Left central 7 and left inattention persistent  Assessment/Plan: 1. Functional deficits secondary to right thalamic hemorrhage which require 3+ hours per day of interdisciplinary therapy in a comprehensive inpatient rehab setting.  Physiatrist is providing close team supervision and 24 hour management of active medical problems listed below.  Physiatrist and rehab team continue to assess barriers to discharge/monitor patient progress toward functional and medical goals  Care Tool:  Bathing    Body parts bathed by patient: Chest, Abdomen, Front perineal area, Right upper leg, Left upper leg, Face   Body parts bathed by helper: Buttocks, Right arm, Left arm, Right lower leg, Left lower leg     Bathing assist  Assist Level: Maximal Assistance - Patient 24 - 49%     Upper Body Dressing/Undressing Upper body dressing   What is the patient wearing?: Pull over shirt    Upper body assist Assist Level: Moderate Assistance - Patient 50 - 74%    Lower Body Dressing/Undressing Lower body dressing      What is the patient wearing?: Pants     Lower body assist Assist for lower body dressing: Moderate Assistance - Patient 50 - 74%     Toileting Toileting Toileting Activity did not occur (Clothing management and hygiene only): N/A (no void or bm)  Toileting assist Assist for toileting: Dependent - Patient 0%     Transfers Chair/bed transfer  Transfers assist     Chair/bed transfer assist level: Moderate Assistance - Patient 50 - 74%     Locomotion Ambulation   Ambulation assist   Ambulation activity did not occur: Safety/medical concerns(Increased R hip pain in standing, pt unable to tolerance standing)  Assist level: 2 helpers Assistive device: (3 muskateers) Max distance: 20 ft   Walk 10 feet activity   Assist  Walk 10 feet activity did not occur: Safety/medical concerns(Increased R hip pain in standing, pt unable to tolerance standing)  Assist level: 2 helpers Assistive device: (3 muskateers)   Walk 50 feet activity   Assist Walk 50 feet with 2 turns activity did not occur: Safety/medical concerns(Increased R hip pain in standing, pt unable to tolerance standing)  Assist level: 2 helpers Assistive device: No Device    Walk 150 feet activity   Assist Walk 150 feet activity did not occur: Safety/medical concerns(Increased  R hip pain in standing, pt unable to tolerance standing)         Walk 10 feet on uneven surface  activity   Assist Walk 10 feet on uneven surfaces activity did not occur: Safety/medical concerns(Increased R hip pain in standing, pt unable to tolerance standing)         Wheelchair     Assist Will patient use wheelchair at  discharge?: Yes Type of Wheelchair: Manual    Wheelchair assist level: Supervision/Verbal cueing Max wheelchair distance: 150 ft    Wheelchair 50 feet with 2 turns activity    Assist    Wheelchair 50 feet with 2 turns activity did not occur: Safety/medical concerns(unable without skilled intervention)   Assist Level: Supervision/Verbal cueing   Wheelchair 150 feet activity     Assist  Wheelchair 150 feet activity did not occur: Safety/medical concerns(unable without skilled intervention)   Assist Level: Supervision/Verbal cueing   Blood pressure 134/77, pulse (!) 48, temperature 98 F (36.7 C), temperature source Oral, resp. rate 16, height 6' (1.829 m), weight 76 kg, SpO2 99 %.  Medical Problem List and Plan: 1.Left hemiparesis and visual-spatial deficitssecondary to right thalamic hemorrhage with intraventricular extension  Continue CIR  -WHO,PRAFO for LUE and LLE  -working toward dc of 3/26 2. Antithrombotics:  -DVT/anticoagulation:Mechanical:Sequential compression devices, below kneeBilateral lower extremities   Dopplers negative for DVT -antiplatelet therapy: N/a 3.Headaches/Pain Management:Fioricet or ultram [place]'t helped a great deal.    Depakote 125mg  BID  Topamax  75mg  BID-> increase to 100mg  BID     3/120eadaches under reasonable control 4. Mood:LCSW to follow for evaluation and support. -antipsychotic agents: N/A  -appreciate neuropsych input and assistance   -has been voicing feelings of despair related to his diagnosis  3/15 initiated trial of celexa 10mg  qhs  3/17 team to continue to provide ego support   -may have some cortical contributions to lability/PBA 5. Neuropsych: This patientiscapable of making decisions on hisown behalf.   6. Skin/Wound Care:Routine pressure relief measures. 7. Fluids/Electrolytes/Nutrition:Monitor I/Os. Offer nectar liquids HA worse today , tired due to poor  sleep Poor sleep last noc due to HA, between meals to maintain adequate hydration. 8. HTN: Monitor BP tid-continue Hydralazine, Cozaar, Coreg, Catapres and Norvasc. TItrateas indicated--SBP goal<140.     Nanda Quinton has been soft and bradycardic. Will decrease Amlodipine to 5mg  daily.  3/11: Will decrease Amlodipine further to 2.5mg .  3/12: will stop amlodipine.  3/13- bradycardic- decrease losartan to 25 mg BID and monitor- BP soft as well   Vitals:   10/16/19 0454 10/16/19 0802  BP: 130/81 134/77  Pulse: (!) 48   Resp: 16   Temp: 98 F (36.7 C)   SpO2: 99%   Controlled 3/21 9. Prediabetes: Hgb A1C- 6.2--was 5.7 eight months ago. Will have RD educate on CM diet---      has been well controlled; now off daily CBGs and SSI 10. Tobacco/Cannabis use: Reports that he plans on quitting.  11. Dyslipidemia: Statin on hold due to ICH--resume at discharge. 12.  Post stroke dysphagia: doesn't like consistency but is tolerating due to nectars so far with supervision  -f/u MBS per SLP  3/18 persistent oropharyngeal dysphagia--no change in diet D2/nectars 13.  AKI  Creatinine 1.18 on 3/18  Encourage fluids  Off IVF for now  3/19 recheck labs monday 15. Urinary retention: resolved   Last cath ~36hr ago, voiding well with PVR 0-39ml LOS: 22 days A FACE TO [place] E Aujanae Mccullum  10/16/2019, 10:45 AM

## 2019-10-16 NOTE — Progress Notes (Signed)
Occupational Therapy Session Note  Patient Details  Name: Paul [name] MRN: [id number] Date of Birth: [birth year]  Today's Date: 10/16/2019 OT Individual Time: 0900-1000 OT Individual Time Calculation (min): 60 min   Skilled Therapeutic Interventions/Progress Updates:    Pt greeted in bed with his breakfast tray set up. Pt reporting being awake since 4AM and having a very bad HA. Requesting for either no therapy or bedlevel therapy. Pt declined eating his meal EOB or in the w/c, therefore he ate bedlevel with HOB raised. OT provided HOH to incorporate Lt hand to stabilize his plate, magic cup, and beverages when bilaterally integrating UEs. Pt with no s/s aspiration when eating. Afterwards he was agreeable to work on his Lt hand. Hooked pt up to [employer] Stim One unit. Together, we discussed importance of incorporating visualization techniques in combination with e-stim to maximize its therapeutic effects. Pt verbalizing aloud gripping nail or reaching for hammer, which are very familiar occupation-based tasks that he did at his job prior to hospitalization. Also discussed using visualization techniques before sleep to promote functional return. Pt receptive to education, assisting OT with mindfully "squeezing" sock that was placed in his Lt hand. HOH to wash hands using sanitizer and also to apply lotion from home. Pt appreciative. Left him with all needs within reach and bed alarm set, Lt arm supported on pillow.   [employer] Stim One applied to forearm extensors for 30 minutes unattended. Skin intact once pads were removed. E-stim parameters are listed below:   Saebo Stim One 330 pulse width 35 Hz pulse rate On 8 sec/ off 8 sec Ramp up/ down 2 sec Symmetrical Biphasic wave form  Max intensity 168mA at 500 Ohm load   Therapy Documentation Precautions:  Precautions Precautions: Fall Precaution Comments: left sided weakness and left lateral lean, SBP <160 Restrictions Weight Bearing Restrictions:  No Other Position/Activity Restrictions: L PRAFO and WHO donned at rest Pain: RN in to provide pain medicine during session, OT also provided lavender scented cotton balls to pillowcase to address holistically per pt request  Pain Assessment Pain Scale: 0-10 Pain Score: 7  Pain Type: Acute pain Pain Location: Head Pain Orientation: Right;Left Pain Descriptors / Indicators: Headache Pain Frequency: Constant Pain Onset: On-going Pain Intervention(s): Medication (See eMAR) ADL: ADL Eating: Not assessed Grooming: Moderate assistance Where Assessed-Grooming: Edge of bed Upper Body Bathing: Moderate assistance Where Assessed-Upper Body Bathing: Edge of bed Lower Body Bathing: Maximal assistance Where Assessed-Lower Body Bathing: Edge of bed Upper Body Dressing: Maximal assistance Where Assessed-Upper Body Dressing: Edge of bed Lower Body Dressing: Maximal assistance Where Assessed-Lower Body Dressing: Edge of bed Toileting: Not assessed Toilet Transfer: Not assessed Tub/Shower Transfer: Not assessed      Therapy/Group: Individual Therapy  Tenae Graziosi A Chestine Belknap 10/16/2019, 12:22 PM

## 2019-10-17 ENCOUNTER — Inpatient Hospital Stay (HOSPITAL_COMMUNITY): Payer: Self-pay | Admitting: Occupational Therapy

## 2019-10-17 ENCOUNTER — Inpatient Hospital Stay (HOSPITAL_COMMUNITY): Payer: Self-pay | Admitting: Physical Therapy

## 2019-10-17 ENCOUNTER — Inpatient Hospital Stay (HOSPITAL_COMMUNITY): Payer: Self-pay | Admitting: Speech Pathology

## 2019-10-17 ENCOUNTER — Inpatient Hospital Stay (HOSPITAL_COMMUNITY): Payer: Self-pay

## 2019-10-17 LAB — BASIC METABOLIC PANEL
Anion gap: 10 (ref 5–15)
BUN: 15 mg/dL (ref 6–20)
CO2: 25 mmol/L (ref 22–32)
Calcium: 9.5 mg/dL (ref 8.9–10.3)
Chloride: 105 mmol/L (ref 98–111)
Creatinine, Ser: 0.85 mg/dL (ref 0.61–1.24)
GFR calc Af Amer: >60 mL/min (ref >60)
GFR calc non Af Amer: >60 mL/min (ref >60)
Glucose, Bld: 115 mg/dL — ABNORMAL HIGH (ref 70–99)
Potassium: 4.1 mmol/L (ref 3.5–5.1)
Sodium: 140 mmol/L (ref 135–145)

## 2019-10-17 LAB — CBC
HCT: 35.3 % — ABNORMAL LOW (ref 39.0–52.0)
Hemoglobin: 11.5 g/dL — ABNORMAL LOW (ref 13.0–17.0)
MCH: 30.3 pg (ref 26.0–34.0)
MCHC: 32.6 g/dL (ref 30.0–36.0)
MCV: 93.1 fL (ref 80.0–100.0)
Platelets: 414 10*3/uL — ABNORMAL HIGH (ref 150–400)
RBC: 3.79 MIL/uL — ABNORMAL LOW (ref 4.22–5.81)
RDW: 13.2 % (ref 11.5–15.5)
WBC: 6.3 10*3/uL (ref 4.0–10.5)
nRBC: 0 % (ref 0.0–0.2)

## 2019-10-17 NOTE — Progress Notes (Signed)
Occupational Therapy Weekly Progress Note  Patient Details  Name: Paul [name] MRN: [id number] Date of Birth: [birth year]  Beginning of progress report period: September 25, 2019 End of progress report period: October 17, 2019  Today's Date: 10/17/2019 OT Individual Time: 1300-1400 OT Individual Time Calculation (min): 60 min    Patient has met 3 of 3 short term goals.  Pt is making steady progress towards OT goals. Pt has demonstrated more consistent squat-pivot transfers with min/mod A. He has improved carryover of hemi dressing techniques and has demonstrated improved sitting and standing balance within BADL tasks. Continue current POC.  Patient continues to demonstrate the following deficits: muscle weakness, impaired timing and sequencing, abnormal tone, unbalanced muscle activation, motor apraxia, decreased coordination and decreased motor planning, decreased attention to left and left side neglect, decreased initiation, decreased attention, decreased awareness, decreased problem solving, decreased safety awareness, decreased memory and delayed processing and decreased sitting balance, decreased standing balance, decreased postural control, hemiplegia and decreased balance strategies and therefore will continue to benefit from skilled OT intervention to enhance overall performance with BADL, Reduce care partner burden and functional use of L UE.  Patient progressing toward long term goals..  Continue plan of care.  OT Short Term Goals Week 3:  OT Short Term Goal 1 (Week 3): Pt will maintain standing balance with mod A within BADL task OT Short Term Goal 1 - Progress (Week 3): Met OT Short Term Goal 2 (Week 3): Pt will thread L UE through shirt sleeve with no more than min A from OT OT Short Term Goal 2 - Progress (Week 3): Met OT Short Term Goal 3 (Week 3): Pt will complete transfer to weaker L side with no more than mod A OT Short Term Goal 3 - Progress (Week 3): Met Week 4:  OT Short Term  Goal 1 (Week 4): LTG=STG 2/2 ELOS  Skilled Therapeutic Interventions/Progress Updates:    Pt greeted semi-reclined in bed and agreeable to OT treatment session. Pt reported he had asked for water a while ago and did not get it. OT retrieved cold nectar thick water from fridge and pt consumed water without coughing. Pt transferred to sitting EOB by hooking LLE and CGA. Squat-pivot transfer to the L side with min A today! Pt then transferred into shower with similar squat-pivot and min A. Bathing completed with hand over hand A to integrate L UE into bathing tasks. Pt used lateral leaning to wash buttocks, but relied on wall on L side for balance when leaning to the L. Min A squat-pivot out of shower. Worked on dressing sit<>stand at the sink with pt able to place LLE in figure 4 position today. Min A to thread pant legs. Min A sit<>stand, then mod A for standing balance when pulling pants over hips. Pt with good carryover of UB hemi dressing strategies and was able to don shirt today with CGA. Squat-pivot back to bed with min A. OT placed SAEBO e-stim to shoulder to help approximate shoulder joint from subluxation. SAEBO left on for 60 minutes. OT returned to remove SAEBO with skin intact and no adverse reactions.  Saebo Stim One 330 pulse width 35 Hz pulse rate On 8 sec/ off 8 sec Ramp up/ down 2 sec Symmetrical Biphasic wave form  Max intensity 147m at 500 Ohm load   Therapy Documentation Precautions:  Precautions Precautions: Fall Precaution Comments: left sided weakness and left lateral lean, SBP <160 Restrictions Weight Bearing Restrictions: No Other Position/Activity Restrictions: L PRAFO  and WHO donned at rest Pain:   denies pain this afternoon  Therapy/Group: Individual Therapy  Valma Cava 10/17/2019, 2:00 PM

## 2019-10-17 NOTE — Progress Notes (Signed)
Speech Language Pathology Weekly Progress and Session Note  Patient Details  Name: Jacobson Milan MRN: [id number] Date of Birth: Mar 19, 1966  Beginning of progress report period: October 10, 2019 End of progress report period: October 17, 2019  Today's Date: 10/17/2019 SLP Individual Time: 1400-1425 SLP Individual Time Calculation (min): 25 min  Short Term Goals: Week 3: SLP Short Term Goal 1 (Week 3): Pt will consume current diet with minmal overt s/sx aspiration and demonstrate efficient mastication and oral clearance with Supervision A verbal cues for use of swallow strategies. SLP Short Term Goal 1 - Progress (Week 3): Met SLP Short Term Goal 2 (Week 3): Pt will consume trials of thin liquids over 3 sessions with minimal overt s/sx aspiration with Min verbal cues to assess readiness for repeat MBSS. SLP Short Term Goal 2 - Progress (Week 3): Not met SLP Short Term Goal 3 (Week 3): Pt will demonstrate ability to problem solve functional, familiar and basic tasks with Min A verbal/visual cues. SLP Short Term Goal 3 - Progress (Week 3): Met SLP Short Term Goal 4 (Week 3): Patient will demonstrate sustained attention to functional tasks for ~30 minutes with Min verbal cues for redirection. SLP Short Term Goal 4 - Progress (Week 3): Met SLP Short Term Goal 5 (Week 3): Pt will use speech intelligibility strategies to increase intelligibility to 90% at the sentence level with supervision cues. SLP Short Term Goal 5 - Progress (Week 3): Met SLP Short Term Goal 6 (Week 3): Patient will utilize external aids to recall new, daily information with Mod verbal and visual cues. SLP Short Term Goal 6 - Progress (Week 3): Met    New Short Term Goals: Week 4: SLP Short Term Goal 1 (Week 4): STGs=LTGs due to ELOS  Weekly Progress Updates: Patient continues to make functional but slow gains and has met 5 of 6 STGs this reporting period. Patient remains on Dys. 2 textures with nectar-thick liquids after his  repeat MBS this week which showed continued silent aspiration of thin liquids despite postural changes and compensatory strategies. Patient has completed RMT exercises and demonstrates improved vocal intensity and overall speech intelligibility at the phrase and sentence level. Patient also demonstrates improved cognitive functioning and requires overall Min-Mod A multimodal cues to complete functional and familiar tasks safely in regards to problem solving, attention and recall. Patient and family education ongoing. Patient would benefit from continued skilled SLP intervention to maximize his cognitive, swallowing and speech function prior to discharge.      Intensity: Minumum of 1-2 x/day, 30 to 90 minutes Frequency: 3 to 5 out of 7 days Duration/Length of Stay: 3/26 Treatment/Interventions: Cognitive remediation/compensation;Speech/Language facilitation;Cueing hierarchy;Functional tasks;Internal/external aids;Dysphagia/aspiration precaution training;Patient/family education;Therapeutic Activities;Environmental controls   Daily Session  Skilled Therapeutic Interventions: Skilled treatment session focused on speech and swallowing goals. SLP facilitated session by providing supervision verbal cues for patient to perform 25 repetitions of both IMST and EMST exercises with a self-perceived effort level of 7/10. Patient utilized his speech intelligibility strategies at the phrase and sentence level with overall Min A verbal cues. SLP also facilitated by providing trials of Dys. 3 textures. Patient demonstrated mildly prolonged mastication without overt s/s of aspiration with liquid washes needed to clear mild oral residue. Recommend continued trials with SLP. Overall, patient appeared brighter and more interactive. Patient left upright in bed with alarm on and all needs within reach. Continue with current plan of care.     Pain No/Denies Pain   Therapy/Group: Individual Therapy  Zarif Rathje,  Mykaila Blunck 10/17/2019, 3:03 PM

## 2019-10-17 NOTE — Progress Notes (Signed)
Physical Therapy Session Note  Patient Details  Name: Paul [name] MRN: [id number] Date of Birth: 1966-01-07  Today's Date: 10/17/2019 PT Individual Time: 0905-1000 PT Individual Time Calculation (min): 55 min   Short Term Goals: Week 4:  PT Short Term Goal 1 (Week 4): STG = LTG due to estimated d/c date.  Skilled Therapeutic Interventions/Progress Updates:  Pt received in bed & agreeable to tx. Therapist educated pt on DME recommendations (hospital bed, w/c) & f/u HHPT. Therapist donned B shoes, L GRAFO & shoe cover total assist. Pt transfers supine>sit with min assist for crossing BLE for compensatory pattern and cuing to attend to LUE. Pt completes squat pivot to w/c on L with cuing for squat vs stand with good demo by pt & more improved eccentric control noted on this date. Pt propels w/c room>dayroom with R hemi technique & supervision. Pt transfers sit<>stand with min assist & therapist dons lite gait harness total assist (pt reports discomfort in R groin with use of harness with improvement with harness loosened & then removed at end of session). Gait training on treadmill with lite gait with focus on stepping pattern, activation & advancement of LLE with therapist attempting to provide assistance at L knee to prevent buckling (still very hard to correct), with manual facilitation for weight shifting R to allow increased ease of L foot advancement. At times it appears as though pt has some activation in LLE to advance it. Pt performed mini squats in lite gait with focus on LLE activation with some L glute/hamstring activation noted, even though pt reports he still cannot feel it. At end of session pt left in w/c with chair alarm donned, all needs in reach.  Pt noted to be in a better mood today!  Therapy Documentation Precautions:  Precautions Precautions: Fall Precaution Comments: left sided weakness and left lateral lean, SBP <160 Restrictions Weight Bearing Restrictions: No Other  Position/Activity Restrictions: L PRAFO and WHO donned at rest   Pain: 5/10 HA but pt states he's premedicated   Therapy/Group: Individual Therapy  [name] 10/17/2019, 12:43 PM

## 2019-10-17 NOTE — Progress Notes (Signed)
Physical Therapy Session Note  Patient Details  Name: Paul [name] MRN: [id number] Date of Birth: 1965/08/03  Today's Date: 10/17/2019 PT Individual Time: 1115-1200 PT Individual Time Calculation (min): 45 min   Short Term Goals: Week 4:  PT Short Term Goal 1 (Week 4): STG = LTG due to estimated d/c date.  Skilled Therapeutic Interventions/Progress Updates:    Functional bed mobility with use of bedrail CGA to come to EOB with focus on awareness and management of L hemibody. Requires max cues for LUE but able to hook LE's and manage both supine <> sit. Performed squat/stand pivot transfer to the R with overall min assist with cues for attention to foot placement and technique. Improved control noted with slower speed.  NMR during gait trials with 3 muskateers technique and progressed to trial with HW to address postural control re-training, LLE motor activation, pelvic mobility, and weightshifting. Utilized theraband wrap on LLE from ankle around knee and across anterior section of hip to aid with hip and knee flexion and DF assist. Requires total to advance and place LLE but noted some activation in hip flexion and extension with some stance control noted. Facilitation of weightshift and pelvic mobility. Performed 2 trials with +2 for 3 muscateers x 20' each trial and then another 6' with Hemiwalker. With hemiwalker, +2 for safety but max of 1 for actual gait and cues for gait pattern advancement of AD. As pt fatigues, increased pushing to the L noted and requires up to mod assist for sit <> stands (otherwise min assist). Returned to bed end of session as described above and all needs in reach.   Therapy Documentation Precautions:  Precautions Precautions: Fall Precaution Comments: left sided weakness and left lateral lean, SBP <160 Restrictions Weight Bearing Restrictions: No Other Position/Activity Restrictions: L PRAFO and WHO donned at rest   Pain: Reports still having a headache and  worn out from therapies this morning. Premedicated and agreeable to session. Did not rate HA pain.   Therapy/Group: Individual Therapy  Canary Brim Ivory Broad, PT, DPT, CBIS  10/17/2019, 12:21 PM

## 2019-10-17 NOTE — Progress Notes (Signed)
Paul [name]  Subjective/Complaints:  Had a reasonable night. Awoke with a mild headache but states that generalloy they're better  ROS: Patient denies fever, rash, sore throat, blurred vision, nausea, vomiting, diarrhea, cough, shortness of breath or chest pain, joint or back pain, headache, or mood change.   Objective:   No results found. Recent Labs    10/17/19 0550  WBC 6.3  HGB 11.5*  HCT 35.3*  PLT 414*   Recent Labs    10/17/19 0550  NA 140  K 4.1  CL 105  CO2 25  GLUCOSE 115*  BUN 15  CREATININE 0.85  CALCIUM 9.5    Intake/Output Summary (Last 24 hours) at 10/17/2019 1102 Last data filed at 10/17/2019 [id number] Gross per 24 hour  Intake 920 ml  Output 375 ml  Net 545 ml     Physical Exam: Vital Signs Blood pressure 119/84, pulse (!) 51, temperature 98.4 F (36.9 C), temperature source Oral, resp. rate 17, height 6' (1.829 m), weight 76 kg, SpO2 98 %.   Constitutional: No distress . Vital signs reviewed. HEENT: EOMI, oral membranes moist Neck: supple Cardiovascular: RRR without murmur. No JVD    Respiratory/Chest: CTA Bilaterally without wheezes or rales. Normal effort    GI/Abdomen: BS +, non-tender, non-distended Ext: no clubbing, cyanosis, or edema Psych: flat but pleasant Skin: No evidence of breakdown, no evidence of rash Neurologic: 0/5 LUE, 1/5 HE LLE, absent sensation LUE and LLE  Musculoskeletal: Full range of motion in all 4 extremities. No joint swelling CN7, left inattention, food on left lip  Assessment/Plan: 1. Functional deficits secondary to right thalamic hemorrhage which require 3+ hours per day of interdisciplinary therapy in a comprehensive inpatient rehab setting.  Physiatrist is providing close team supervision and 24 hour management of active medical problems listed below.  Physiatrist and rehab team continue to assess barriers to discharge/monitor patient progress toward functional  and medical goals  Care Tool:  Bathing    Body parts bathed by patient: Chest, Abdomen, Front perineal area, Right upper leg, Left upper leg, Face   Body parts bathed by helper: Buttocks, Right arm, Left arm, Right lower leg, Left lower leg     Bathing assist Assist Level: Maximal Assistance - Patient 24 - 49%     Upper Body Dressing/Undressing Upper body dressing   What is the patient wearing?: Pull over shirt    Upper body assist Assist Level: Moderate Assistance - Patient 50 - 74%    Lower Body Dressing/Undressing Lower body dressing      What is the patient wearing?: Pants     Lower body assist Assist for lower body dressing: Moderate Assistance - Patient 50 - 74%     Toileting Toileting Toileting Activity did not occur (Clothing management and hygiene only): N/A (no void or bm)  Toileting assist Assist for toileting: Dependent - Patient 0%     Transfers Chair/bed transfer  Transfers assist     Chair/bed transfer assist level: Moderate Assistance - Patient 50 - 74%     Locomotion Ambulation   Ambulation assist   Ambulation activity did not occur: Safety/medical concerns(Increased R hip pain in standing, pt unable to tolerance standing)  Assist level: 2 helpers Assistive device: (3 muskateers) Max distance: 20 ft   Walk 10 feet activity   Assist  Walk 10 feet activity did not occur: Safety/medical concerns(Increased R hip pain in standing, pt unable to tolerance standing)  Assist level: 2 helpers Assistive  device: (3 muskateers)   Walk 50 feet activity   Assist Walk 50 feet with 2 turns activity did not occur: Safety/medical concerns(Increased R hip pain in standing, pt unable to tolerance standing)  Assist level: 2 helpers Assistive device: No Device    Walk 150 feet activity   Assist Walk 150 feet activity did not occur: Safety/medical concerns(Increased R hip pain in standing, pt unable to tolerance standing)         Walk 10  feet on uneven surface  activity   Assist Walk 10 feet on uneven surfaces activity did not occur: Safety/medical concerns(Increased R hip pain in standing, pt unable to tolerance standing)         Wheelchair     Assist Will patient use wheelchair at discharge?: Yes Type of Wheelchair: Manual    Wheelchair assist level: Supervision/Verbal cueing Max wheelchair distance: 150 ft    Wheelchair 50 feet with 2 turns activity    Assist    Wheelchair 50 feet with 2 turns activity did not occur: Safety/medical concerns(unable without skilled intervention)   Assist Level: Supervision/Verbal cueing   Wheelchair 150 feet activity     Assist  Wheelchair 150 feet activity did not occur: Safety/medical concerns(unable without skilled intervention)   Assist Level: Supervision/Verbal cueing   Blood pressure 119/84, pulse (!) 51, temperature 98.4 F (36.9 C), temperature source Oral, resp. rate 17, height 6' (1.829 m), weight 76 kg, SpO2 98 %.  Medical Problem List and Plan: 1.Left hemiparesis and visual-spatial deficitssecondary to right thalamic hemorrhage with intraventricular extension  Continue CIR  -WHO,PRAFO for LUE and LLE  -working toward dc of 3/26 2. Antithrombotics:  -DVT/anticoagulation:Mechanical:Sequential compression devices, below kneeBilateral lower extremities   Dopplers negative for DVT -antiplatelet therapy: N/a 3.Headaches/Pain Management:Fioricet or ultram prnhaven't helped a great deal.    Depakote 125mg  BID  Topamax  75mg  BID-> increase to 100mg  BID      3/22 headaches under reasonable control   -dc depakote and observe 4. Mood:LCSW to follow for evaluation and support. -antipsychotic agents: N/A  -appreciate neuropsych input and assistance   -has been voicing feelings of despair related to his diagnosis  3/15 initiated trial of celexa 10mg  qhs  3/17 team to continue to provide ego  support   -may have some cortical contributions to lability/PBA 5. Neuropsych: This patientiscapable of making decisions on hisown behalf.   6. Skin/Wound Care:Routine pressure relief measures. 7. Fluids/Electrolytes/Nutrition:Monitor I/Os. Offer nectar liquids HA worse today , tired due to poor sleep Poor sleep last noc due to HA, between meals to maintain adequate hydration. 8. HTN: Monitor BP tid-continue Hydralazine, Cozaar, Coreg, Catapres and Norvasc. TItrateas indicated--SBP goal<140.     Nanda Quinton has been soft and bradycardic. Will decrease Amlodipine to 5mg  daily.  3/11: Will decrease Amlodipine further to 2.5mg .  3/12: will stop amlodipine.  3/13- bradycardic- decrease losartan to 25 mg BID and monitor- BP soft as well   Vitals:   10/16/19 1920 10/17/19 0309  BP: 135/76 119/84  Pulse: (!) 53 (!) 51  Resp: 18 17  Temp: 97.7 F (36.5 C) 98.4 F (36.9 C)  SpO2: 99% 98%  Controlled 3/22, bradycardia stable 9. Prediabetes: Hgb A1C- 6.2--was 5.7 eight months ago. Will have RD educate on CM diet---      has been well controlled; now off daily CBGs and SSI 10. Tobacco/Cannabis use: Reports that he plans on quitting.  11. Dyslipidemia: Statin on hold due to ICH--resume at discharge. 12.  Post stroke dysphagia: doesn't  like consistency but is tolerating due to nectars so far with supervision  -f/u MBS per SLP  3/18 persistent oropharyngeal dysphagia--no change in diet D2/nectars 13.  AKI  Creatinine 1.18 on 3/18  Encourage fluids  Off IVF for now  3/21: labs holding nicely today 15/0.85 15. Urinary retention: resolved   Voiding well with urecholine  -continent/ minimal PVR's LOS: 23 days A FACE TO Paul [name] 10/17/2019, 11:02 AM

## 2019-10-17 NOTE — Plan of Care (Signed)
  Problem: Consults Goal: RH STROKE PATIENT EDUCATION Description: See Patient Education module for education specifics  Outcome: Progressing Goal: Nutrition Consult-if indicated Outcome: Progressing   Problem: RH BOWEL ELIMINATION Goal: RH STG MANAGE BOWEL WITH ASSISTANCE Description: STG Manage Bowel with mod Assistance. Outcome: Progressing Goal: RH STG MANAGE BOWEL W/MEDICATION W/ASSISTANCE Description: STG Manage Bowel with Medication with mod Assistance. Outcome: Progressing   Problem: RH BLADDER ELIMINATION Goal: RH STG MANAGE BLADDER WITH ASSISTANCE Description: STG Manage Bladder With min Assistance Outcome: Progressing Goal: RH STG MANAGE BLADDER WITH MEDICATION WITH ASSISTANCE Description: STG Manage Bladder With Medication With min Assistance. Outcome: Progressing   Problem: RH SKIN INTEGRITY Goal: RH STG SKIN FREE OF INFECTION/BREAKDOWN Description: Skin to remain free from breakdown while on rehab with min assist. Outcome: Progressing Goal: RH STG MAINTAIN SKIN INTEGRITY WITH ASSISTANCE Description: STG Maintain Skin Integrity With min Assistance. Outcome: Progressing   Problem: RH SAFETY Goal: RH STG ADHERE TO SAFETY PRECAUTIONS W/ASSISTANCE/DEVICE Description: STG Adhere to Safety Precautions With mod Assistance and appropriate assistive Device. Outcome: Progressing Goal: RH STG DECREASED RISK OF FALL WITH ASSISTANCE Description: STG Decreased Risk of Fall With mod Assistance. Outcome: Progressing   Problem: RH PAIN MANAGEMENT Goal: RH STG PAIN MANAGED AT OR BELOW PT'S PAIN GOAL Description: <3 on a 0-10 pain scale Outcome: Progressing   Problem: RH KNOWLEDGE DEFICIT Goal: RH STG INCREASE KNOWLEDGE OF HYPERTENSION Description: Patient will be able to demonstrate knowledge of HTN medications, dietary restrictions, and follow up care with the MD post discharge with min assist from staff. Outcome: Progressing Goal: RH STG INCREASE KNOWLEDGE OF  DYSPHAGIA/FLUID INTAKE Description: Patient will be able to follow safe swallowing measures and goals with min assist from staff at discharge from [place]. Outcome: Progressing Goal: RH STG INCREASE KNOWLEGDE OF HYPERLIPIDEMIA Description: Patient will be able to demonstrate knowledge of HLD medications and their usage at discharge with min assist from staff by discharge from [place]. Outcome: Progressing Goal: RH STG INCREASE KNOWLEDGE OF STROKE PROPHYLAXIS Description: Patient will demonstrate knowledge of medications used to prevent future strokes with min assist from staff by discharge from [place]. Outcome: Progressing

## 2019-10-17 NOTE — Progress Notes (Signed)
Social Work [name], male   DOB: [birth year], 54 y.o.   MRN: [id number]    Per therapy team, they would like to have family education this week. SW spoke with pt brother Paul [name]. He will be present for family education on Wednesday (3/24) and Thursday (3/25). SW informed team.   Loralee Pacas, MSW, [place] Office: [phone] Cell: [phone] Fax: [phone]

## 2019-10-18 ENCOUNTER — Inpatient Hospital Stay (HOSPITAL_COMMUNITY): Payer: Self-pay | Admitting: Physical Therapy

## 2019-10-18 ENCOUNTER — Inpatient Hospital Stay (HOSPITAL_COMMUNITY): Payer: Self-pay | Admitting: Occupational Therapy

## 2019-10-18 ENCOUNTER — Inpatient Hospital Stay (HOSPITAL_COMMUNITY): Payer: Self-pay | Admitting: Speech Pathology

## 2019-10-18 MED ORDER — SENNOSIDES-DOCUSATE SODIUM 8.6-50 MG PO TABS
2.0000 | ORAL_TABLET | Freq: Two times a day (BID) | ORAL | Status: DC
  Administered 2019-10-18 – 2019-10-21 (×6): 2 via ORAL
  Filled 2019-10-18 (×6): qty 2

## 2019-10-18 NOTE — Progress Notes (Signed)
[facility] [facility] PHYSICAL MEDICINE & REHABILITATION PROGRESS NOTE  Subjective/Complaints:  In a little better spirits today. Happy to be going home at end of week  ROS: Patient denies fever, rash, sore throat, blurred vision, nausea, vomiting, diarrhea, cough, shortness of breath or chest pain, joint or back pain, headache, or mood change.    Objective:   No results found. Recent Labs    10/17/19 0550  WBC 6.3  HGB 11.5*  HCT 35.3*  PLT 414*   Recent Labs    10/17/19 0550  NA 140  K 4.1  CL 105  CO2 25  GLUCOSE 115*  BUN 15  CREATININE 0.85  CALCIUM 9.5    Intake/Output Summary (Last 24 hours) at 10/18/2019 1247 Last data filed at 10/18/2019 0818 Gross per 24 hour  Intake 140 ml  Output 850 ml  Net -710 ml     Physical Exam: Vital Signs Blood pressure 128/82, pulse (!) 46, temperature (!) 97.3 F (36.3 C), temperature source Oral, resp. rate 17, height 6' (1.829 m), weight 76 kg, SpO2 100 %.  Constitutional: No distress . Vital signs reviewed. HEENT: EOMI, oral membranes moist Neck: supple Cardiovascular: RRR without murmur. No JVD    Respiratory/Chest: CTA Bilaterally without wheezes or rales. Normal effort    GI/Abdomen: BS +, non-tender, non-distended Ext: no clubbing, cyanosis, or edema Psych: flat but a little more engaging Skin: No evidence of breakdown, no evidence of rash Neurologic: 0/5 LUE, 1/5 HE LLE, absent sensation LUE and LLE  Musculoskeletal: Full range of motion in all 4 extremities. No joint swelling CN7 and 12.  Assessment/Plan: 1. Functional deficits secondary to right thalamic hemorrhage which require 3+ hours per day of interdisciplinary therapy in a comprehensive inpatient rehab setting.  Physiatrist is providing close team supervision and 24 hour management of active medical problems listed below.  Physiatrist and rehab team continue to assess barriers to discharge/monitor patient progress toward functional and medical goals  Care  Tool:  Bathing    Body parts bathed by patient: Chest, Abdomen, Front perineal area, Right upper leg, Left upper leg, Face   Body parts bathed by helper: Buttocks, Right arm, Left arm, Right lower leg, Left lower leg     Bathing assist Assist Level: Maximal Assistance - Patient 24 - 49%     Upper Body Dressing/Undressing Upper body dressing   What is the patient wearing?: Pull over shirt    Upper body assist Assist Level: Moderate Assistance - Patient 50 - 74%    Lower Body Dressing/Undressing Lower body dressing      What is the patient wearing?: Pants     Lower body assist Assist for lower body dressing: Moderate Assistance - Patient 50 - 74%     Toileting Toileting Toileting Activity did not occur Landscape architect and hygiene only): N/A (no void or bm)  Toileting assist Assist for toileting: Dependent - Patient 0%     Transfers Chair/bed transfer  Transfers assist     Chair/bed transfer assist level: Minimal Assistance - Patient > 75%     Locomotion Ambulation   Ambulation assist   Ambulation activity did not occur: Safety/medical concerns(Increased R hip pain in standing, pt unable to tolerance standing)  Assist level: 2 helpers(max assist + w/c follow for safety) Assistive device: (hemiwalker) Max distance: 10 ft   Walk 10 feet activity   Assist  Walk 10 feet activity did not occur: Safety/medical concerns(Increased R hip pain in standing, pt unable to tolerance standing)  Assist  level: 2 helpers Assistive device: Walker-hemi   Walk 50 feet activity   Assist Walk 50 feet with 2 turns activity did not occur: Safety/medical concerns(Increased R hip pain in standing, pt unable to tolerance standing)  Assist level: 2 helpers Assistive device: No Device    Walk 150 feet activity   Assist Walk 150 feet activity did not occur: Safety/medical concerns(Increased R hip pain in standing, pt unable to tolerance standing)         Walk 10  feet on uneven surface  activity   Assist Walk 10 feet on uneven surfaces activity did not occur: Safety/medical concerns(Increased R hip pain in standing, pt unable to tolerance standing)         Wheelchair     Assist Will patient use wheelchair at discharge?: Yes Type of Wheelchair: Manual    Wheelchair assist level: Supervision/Verbal cueing Max wheelchair distance: 100 ft    Wheelchair 50 feet with 2 turns activity    Assist    Wheelchair 50 feet with 2 turns activity did not occur: Safety/medical concerns(unable without skilled intervention)   Assist Level: Supervision/Verbal cueing   Wheelchair 150 feet activity     Assist  Wheelchair 150 feet activity did not occur: Safety/medical concerns(unable without skilled intervention)   Assist Level: Supervision/Verbal cueing   Blood pressure 128/82, pulse (!) 46, temperature (!) 97.3 F (36.3 C), temperature source Oral, resp. rate 17, height 6' (1.829 m), weight 76 kg, SpO2 100 %.  Medical Problem List and Plan: 1.Left hemiparesis and visual-spatial deficitssecondary to right thalamic hemorrhage with intraventricular extension  Continue CIR  -WHO,PRAFO for LUE and LLE  -working toward dc of 3/26, family ed this week. Team conf today 2. Antithrombotics:  -DVT/anticoagulation:Mechanical:Sequential compression devices, below kneeBilateral lower extremities   Dopplers negative for DVT -antiplatelet therapy: N/a 3.Headaches/Pain Management:Fioricet or ultram prnhaven't helped a great deal.    Depakote 125mg  BID  Topamax  75mg  BID-> increase to 100mg  BID      3/22 headaches under reasonable control   -dc'ed depakote    3/23 h/a remains under controlled. 4. Mood:LCSW to follow for evaluation and support. -antipsychotic agents: N/A  -appreciate neuropsych input and assistance   -has been voicing feelings of despair related to his diagnosis  3/15 initiated trial  of celexa 10mg  qhs  3/17 team to continue to provide ego support   -may have some cortical contributions to lability/PBA 5. Neuropsych: This patientiscapable of making decisions on hisown behalf.   6. Skin/Wound Care:Routine pressure relief measures. 7. Fluids/Electrolytes/Nutrition:Monitor I/Os.   -hydration has been better 8. HTN: Monitor BP tid-continue Hydralazine, Cozaar, Coreg, Catapres and Norvasc. TItrateas indicated--SBP goal<140.     Nanda Quinton has been soft and bradycardic. Will decrease Amlodipine to 5mg  daily.  3/11: Will decrease Amlodipine further to 2.5mg .  3/12: will stop amlodipine.  3/13- bradycardic- decrease losartan to 25 mg BID and monitor- BP soft as well   Vitals:   10/17/19 2300 10/18/19 0512  BP:  128/82  Pulse:  (!) 46  Resp:  17  Temp:  (!) 97.3 F (36.3 C)  SpO2: 98% 100%  Controlled 3/23, bradycardia stable 9. Prediabetes: Hgb A1C- 6.2--was 5.7 eight months ago. Will have RD educate on CM diet---      has been well controlled; now off daily CBGs and SSI 10. Tobacco/Cannabis use: Reports that he plans on quitting.  11. Dyslipidemia: Statin on hold due to ICH--resume at discharge. 12.  Post stroke dysphagia: doesn't like consistency but is tolerating  due to nectars so far with supervision  -f/u MBS per SLP  3/18 persistent oropharyngeal dysphagia--no change in diet D2/nectars 13.  AKI  Creatinine 1.18 on 3/18  Encourage fluids  Off IVF for now  3/21: labs holding nicely 15/0.85  3/22 labs WNL 14. Slow transit constipation:  -augment regimen, increase senna-s to 2 tabs bid 15. Urinary retention: resolved   Voiding well with urecholine  -continent/ minimal PVR's for several days now LOS: 24 days A FACE TO FACE EVALUATION WAS PERFORMED  Meredith Staggers 10/18/2019, 12:47 PM

## 2019-10-18 NOTE — Progress Notes (Signed)
Nutrition Follow-up  DOCUMENTATION CODES:   Not applicable  INTERVENTION:   Continue snacks TID  ContinueMagic cup TID with meals, each supplement provides 290 kcal and 9 grams of protein  ContinueHormelShakesTID, each supplement provides 520 kcal and 22 grams of protein    NUTRITION DIAGNOSIS:   Inadequate oral intake related to dysphagia as evidenced by meal completion < 50%.  Ongoing.  GOAL:   Patient will meet greater than or equal to 90% of their needs  Progressing.   MONITOR:   PO intake, Supplement acceptance, Diet advancement  REASON FOR ASSESSMENT:   Consult Diet education  ASSESSMENT:   Pt with a PMH of HTN, HLD, embolic stroke, tobacco and cannabis use who was admitted on 09/17/19 with right thalamic hemorrhage with IVH and he was transferred to [facility] for management. Pt now with improving mentation and diet advanced to dysphagia 2, nectar liquids. Pt with limitations due to left hemiplegia with left inattention, difficulty with multi-step commands but showing improvement in attention. Admitted to CIR on 2/27.  Pt reports appetite is getting better and that he is eating snacks and Magic Cup. He reports he sometimes drinks the [employer] shakes. Pt expresses excitement about his anticipated discharge on 3/26.   PO Intake: 0-100% x last 8 recorded meals (53% average intake)  Medications reviewed and include: MVI, Senokot  Labs reviewed.  UOP: 873ml x24 hours I/O: -6,136.34ml since admit  Diet Order:   Diet Order            DIET DYS 2 Room service appropriate? Yes; Fluid consistency: Nectar Thick  Diet effective now              EDUCATION NEEDS:   Education needs have been addressed  Skin:  Skin Assessment: Reviewed RN Assessment  Last BM:  3/19  Height:   Ht Readings from Last 1 Encounters:  09/25/19 6' (1.829 m)    Weight:   Wt Readings from Last 1 Encounters:  10/13/19 76 kg   BMI:  Body mass index is 22.73  kg/m.  Estimated Nutritional Needs:   Kcal:  2100-2300  Protein:  95-110 grams  Fluid:  2L/d   Larkin Ina, MS, RD, LDN RD pager number and weekend/on-call pager number located in [place].

## 2019-10-18 NOTE — Patient Care Conference (Signed)
Inpatient RehabilitationTeam Conference and Plan of Care Update Date: 10/18/2019   Time: 10:40 AM     Patient Name: Paul [name]      Medical Record Number: [id number]  Date of Birth: 04/17/1966 Sex: Male         Room/Bed: 4W17C/4W17C-01 Payor Info: Payor: MEDICAID PENDING / Plan: MEDICAID PENDING / Product Type: *No Product type* /    Admit Date/Time:  09/24/2019  3:47 PM  Primary Diagnosis:  Thalamic hemorrhage [facility])  Patient Active Problem List   Diagnosis Date Noted  . Reactive depression   . AKI (acute kidney injury) (HCC)   . Dysphagia, post-stroke   . Prediabetes   . Acute bilateral low back pain without sciatica   . Vascular headache   . Thalamic hemorrhage (HCC) 09/24/2019  . Cerebral edema (HCC)   . Pure hypercholesterolemia   . ICH (intracerebral hemorrhage) (HCC) 09/17/2019  . Acute CVA (cerebrovascular accident) (HCC) 01/18/2019  . Thrombocytosis (HCC) 01/18/2019  . Pancreatitis 01/07/2019  . Chronic diastolic CHF (congestive heart failure) (HCC) 10/11/2018  . CKD (chronic kidney disease) stage 2, GFR 60-89 ml/min 10/11/2018  . Essential hypertension 05/22/2018  . Dyslipidemia 05/22/2018  . Renal lesion 05/22/2018  . Coronary artery calcification 05/22/2018  . Tobacco abuse 05/22/2018    Expected Discharge Date: Expected Discharge Date: 10/21/19  Team Members Present: Physician leading conference: Dr. Faith Rogue Care Coodinator Present: Cecile Sheerer, LCSWA;Genie Amia Rynders, RN, MSN Nurse Present: Harle Battiest, RN PT Present: Aleda Grana, PT OT Present: Kearney Hard, OT SLP Present: Feliberto Gottron, SLP PPS Coordinator present : Edson Snowball, [place], SLP     Current Status/Progress Goal Weekly Team Focus  Bowel/Bladder   Continent b/b, patient now uses urinal, PVR bladder scans  Remain continent of bowel and bladder  PRN bladder scan   Swallow/Nutrition/ Hydration   Dys. 2 textures with nectar-thick liquids, Supervision, water  protocol  Supervision  trials of upgraded textures, use of swallowing strategies   ADL's   Min/mod A overall, improved sitting and standing balance  Min/mod A  self-care retraining, pt/family ed, dc planning, attention, L NMR, NMES   Mobility   min assist bed mobility, min/mod assist transfers, supervision w/c mobility, max assist car transfer  downgraded to min/mod assist transfers, max assist gait, supervision<>min assist w/c mobility  sitting/standing balance, core/trunk & postural control, w/c mobility, bed mobility, transfers, gait, L NMR, cognition & awareness, d/c planning caregiver training   Communication   Supervision  Supervision  use of speech intelligibility strategies, RMT   Safety/Cognition/ Behavioral Observations  Min-Mod A  Min A  attention, problem solving and recall   Pain   C/O headaches, prn tylenol and fioricet  Remain at tolerable pain level  QS/PRn pain assessment   Skin   No evidence of breakdown  Maintain skin integrity  Qs/PRn skin assessment    Rehab Goals Patient on target to meet rehab goals: Yes *See Care Plan and progress notes for long and short-term goals.     Barriers to Discharge  Current Status/Progress Possible Resolutions Date Resolved   Nursing                  PT                    OT                  SLP                SW  Discharge Planning/Teaching Needs:  Pt to d/c to home with his brother Nida Boatman who will provide 24/7 care.  Family education as recommended by therapy. Edu on 3/24 and 3/25 with his brother.   Team Discussion: MD headaches improving, monitoring BP, meds adjusted, intake better, emptying bladder better. RN headaches, fioricet, BM 3/19, miralax given, using urinal.  OT min A transfers, min sit to stand, min/mod LB ADLs.  PT hemiwalker work, more positive this week.  SLP MBS decreased sensation, moderate aspiration, D2nectar with trials of D3, more interactive.    Revisions to Treatment Plan: N/A      Medical Summary Current Status: right thalamic infarct, continent of bladder. bp's better. dense left hemiparesis---some return proximally LLE Weekly Focus/Goal: finalize medical plan for discharge  Barriers to Discharge: Medical stability   Possible Resolutions to Barriers: see medical progress notes   Continued Need for Acute Rehabilitation Level of Care: The patient requires daily medical management by a physician with specialized training in physical medicine and rehabilitation for the following reasons: Direction of a multidisciplinary physical rehabilitation program to maximize functional independence : Yes Medical management of patient stability for increased activity during participation in an intensive rehabilitation regime.: Yes Analysis of laboratory values and/or radiology reports with any subsequent need for medication adjustment and/or medical intervention. : Yes   I attest that I was present, lead the team conference, and concur with the assessment and plan of the team.   Trish Mage 10/19/2019, 9:34 AM   Team conference was held via web/ teleconference due to COVID - 19

## 2019-10-18 NOTE — Plan of Care (Signed)
  Problem: Consults Goal: RH STROKE PATIENT EDUCATION Description: See Patient Education module for education specifics  Outcome: Progressing Goal: Nutrition Consult-if indicated Outcome: Progressing   Problem: RH BOWEL ELIMINATION Goal: RH STG MANAGE BOWEL WITH ASSISTANCE Description: STG Manage Bowel with mod Assistance. Outcome: Progressing Goal: RH STG MANAGE BOWEL W/MEDICATION W/ASSISTANCE Description: STG Manage Bowel with Medication with mod Assistance. Outcome: Progressing   Problem: RH BLADDER ELIMINATION Goal: RH STG MANAGE BLADDER WITH ASSISTANCE Description: STG Manage Bladder With min Assistance Outcome: Progressing Goal: RH STG MANAGE BLADDER WITH MEDICATION WITH ASSISTANCE Description: STG Manage Bladder With Medication With min Assistance. Outcome: Progressing   Problem: RH SKIN INTEGRITY Goal: RH STG SKIN FREE OF INFECTION/BREAKDOWN Description: Skin to remain free from breakdown while on rehab with min assist. Outcome: Progressing Goal: RH STG MAINTAIN SKIN INTEGRITY WITH ASSISTANCE Description: STG Maintain Skin Integrity With min Assistance. Outcome: Progressing   Problem: RH SAFETY Goal: RH STG ADHERE TO SAFETY PRECAUTIONS W/ASSISTANCE/DEVICE Description: STG Adhere to Safety Precautions With mod Assistance and appropriate assistive Device. Outcome: Progressing Goal: RH STG DECREASED RISK OF FALL WITH ASSISTANCE Description: STG Decreased Risk of Fall With mod Assistance. Outcome: Progressing   Problem: RH PAIN MANAGEMENT Goal: RH STG PAIN MANAGED AT OR BELOW PT'S PAIN GOAL Description: <3 on a 0-10 pain scale Outcome: Progressing   Problem: RH KNOWLEDGE DEFICIT Goal: RH STG INCREASE KNOWLEDGE OF HYPERTENSION Description: Patient will be able to demonstrate knowledge of HTN medications, dietary restrictions, and follow up care with the MD post discharge with min assist from staff. Outcome: Progressing Goal: RH STG INCREASE KNOWLEDGE OF  DYSPHAGIA/FLUID INTAKE Description: Patient will be able to follow safe swallowing measures and goals with min assist from staff at discharge from [place]. Outcome: Progressing Goal: RH STG INCREASE KNOWLEGDE OF HYPERLIPIDEMIA Description: Patient will be able to demonstrate knowledge of HLD medications and their usage at discharge with min assist from staff by discharge from [place]. Outcome: Progressing Goal: RH STG INCREASE KNOWLEDGE OF STROKE PROPHYLAXIS Description: Patient will demonstrate knowledge of medications used to prevent future strokes with min assist from staff by discharge from [place]. Outcome: Progressing

## 2019-10-18 NOTE — Progress Notes (Signed)
Physical Therapy Session Note  Patient Details  Name: Paul [name] MRN: [id number] Date of Birth: [birth year]  Today's Date: 10/18/2019 PT Individual Time: [id number] PT Individual Time Calculation (min): 53 min   Short Term Goals: Week 4:  PT Short Term Goal 1 (Week 4): STG = LTG due to estimated d/c date.  Skilled Therapeutic Interventions/Progress Updates:  Pt received in w/c & agreeable to tx. Pt propels w/c room<>dayroom with R hemi technique & supervision. Therapist dons/doffs L GRAFO & shoe cover for gait training. Sit<>stand with min assist with cuing to scoot out to edge of w/c seat with pt demonstrating difficulty scooting L hip forward. Gait x 10 ft x 3 trials with hemiwalker & max assist + 2nd person for w/c follow for safety. Pt requires max cuing for sequencing hemiwalker & stepping, with max assist for advancing LLE but some slight hip flexion noted when pt attempts to advance LLE. Pt requires cuing for midline orientation and cuing to use hemiwalker to weight shift R for increased ease of L foot advancement. Educated pt on recommendation of going home at w/c & transfer level & progression of gait with HHPT and OPPT as well as AFO consult in the future vs now as pt may need different AFO in the future; pt receptive of education & agreeable. Stand pivot w/c<>kinetron with hemiwalker & max assist with cuing for sequencing. Pt utilizes kinteron in sitting, 1 minute x 3 trials with focus on LLE strengthening & NMR. At end of session pt left in w/c with chair alarm donned, call bell in reach.  Therapy Documentation Precautions:  Precautions Precautions: Fall Precaution Comments: left sided weakness and left lateral lean, SBP <160 Restrictions Weight Bearing Restrictions: No Other Position/Activity Restrictions: L PRAFO and WHO donned at rest  Pain: 2/10 pain in L knee cap - rest breaks provided PRN.    Therapy/Group: Individual Therapy  [name] 10/18/2019, 12:21 PM

## 2019-10-18 NOTE — Progress Notes (Signed)
Physical Therapy Session Note  Patient Details  Name: Paul [name] MRN: [id number] Date of Birth: July 19, 1966  Today's Date: 10/18/2019 PT Individual Time: 1432-1530 PT Individual Time Calculation (min): 58 min   Short Term Goals: Week 4:  PT Short Term Goal 1 (Week 4): STG = LTG due to estimated d/c date.  Skilled Therapeutic Interventions/Progress Updates:    Patient received in bed asleep, easily woken and willing to participate in PT; reports considerable HA but states he's been premedicated and willing to work, just tired. Able to complete bed mobility with MinA and cues to bring L hip forward to EOB, then consistently required MinA for transfer to and from [facility] today. Able to self propel distances easily with S in the hallway with occasional cues for attention to L side. Spent most of session working in [place], initially on TM at 0.57ms but due to difficulty with weight shifting then stepped back to static weight shifting with cues for upright posture as well as translation of weight forward and lateral with weight shifts. Needed Max-total control at L knee due to buckling with weight bearing. Then returned to his room and performed [place] [facility] He was left in bed with bed alarm active in care of RN, all needs otherwise met this afternoon.   Therapy Documentation Precautions:  Precautions Precautions: Fall Precaution Comments: left sided weakness and left lateral lean, SBP <160 Restrictions Weight Bearing Restrictions: No Other Position/Activity Restrictions: L PRAFO and WHO donned at rest Pain: Pain Assessment Pain Scale: 0-10 Pain Score: 8  Pain Location: Head Pain Orientation: Anterior Pain Descriptors / Indicators: Aching Pain Onset: On-going Patients Stated Pain Goal: 3 Pain Intervention(s): Other (Comment)(premedicated by nursing) Multiple Pain Sites: No    Therapy/Group: Individual Therapy   KWindell Norfolk DPT, PN1    Supplemental Physical Therapist [place]   Pager [phone] Rehab Office [phone]   10/18/2019, 3:57 PM

## 2019-10-18 NOTE — Progress Notes (Signed)
Speech Language Pathology Daily Session Note  Patient Details  Name: Odalis Kasel MRN: [id number] Date of Birth: 1965/08/09  Today's Date: 10/18/2019 SLP Individual Time: 1225-1305 SLP Individual Time Calculation (min): 40 min  Short Term Goals: Week 4: SLP Short Term Goal 1 (Week 4): STGs=LTGs due to ELOS  Skilled Therapeutic Interventions: Skilled treatment session focused on dysphagia and speech goals. SLP facilitated session by providing overall supervision level verbal cues for patient to self-monitor and correct left anterior spillage with lunch meal of Dys. 2 textures with nectar-thick liquids. Patient demonstrated intermittent overt s/s of aspiration, suspect due to talking with a full oral cavity. Recommend patient continue current diet. Patient demonstrated improved speech intelligibility and was ~90% intelligible with overall Mod I for use of speech intelligibility strategies at the sentence level in a moderately noisy environment. Patient requested to transfer back to bed at end of session. Patient left supine in bed with alarm on and all needs within reach. Continue with current plan of care.      Pain No/Denies Pain   Therapy/Group: Individual Therapy  Jarrett Albor 10/18/2019, 2:35 PM

## 2019-10-18 NOTE — Progress Notes (Signed)
Occupational Therapy Session Note  Patient Details  Name: Paul [name] MRN: [id number] Date of Birth: [birth year]  Today's Date: 10/18/2019 OT Individual Time: [id number] OT Individual Time Calculation (min): 43 min   Short Term Goals: Week 4:  OT Short Term Goal 1 (Week 4): LTG=STG 2/2 ELOS  Skilled Therapeutic Interventions/Progress Updates:    Pt greeted semi-reclined in bed and agreeable to OT treatment session. Pt came to sitting EOB with CGA. Pt was able to don socks in figure 4 position with OT assist to don shoes 2/2 time management as pt wanted to work on L UE. Pt completed squat-pivot transfer to the L with min A. Pt brought to therapy gym and completed squat-pivot to therapy mat with min A. Pt brought into sidelying and worked on L UE NMR in gravity eliminated sidelying position. Used hand skate and raised table and pt able to activate flexor synergy to achieve some elbow flexion today! OT also noted some scapula protraction/retraction with gravity eliminated. OT provided joint input through wrsit and elbow to help bring shoulder through full ROM. Pt pivoted back to wc with min A and returned to room. OT placed SAEBO e-stim on wrist extensors. SAEBO left on for 60 minutes. OT returned to remove SAEBO with skin intact and no adverse reactions.  [place] Stim One 330 pulse width 35 Hz pulse rate On 8 sec/ off 8 sec Ramp up/ down 2 sec Symmetrical Biphasic wave form  Max intensity 163mA at 500 Ohm load   Therapy Documentation Precautions:  Precautions Precautions: Fall Precaution Comments: left sided weakness and left lateral lean, SBP <160 Restrictions Weight Bearing Restrictions: No Other Position/Activity Restrictions: L PRAFO and WHO donned at rest Pain: Pain Assessment Pain Score: 0-No pain   Therapy/Group: Individual Therapy  Valma Cava 10/18/2019, 9:18 AM

## 2019-10-19 ENCOUNTER — Encounter (HOSPITAL_COMMUNITY): Payer: Self-pay | Admitting: Speech Pathology

## 2019-10-19 ENCOUNTER — Ambulatory Visit (HOSPITAL_COMMUNITY): Payer: Self-pay | Admitting: Physical Therapy

## 2019-10-19 ENCOUNTER — Encounter (HOSPITAL_COMMUNITY): Payer: Self-pay

## 2019-10-19 ENCOUNTER — Inpatient Hospital Stay (HOSPITAL_COMMUNITY): Payer: Self-pay | Admitting: Physical Therapy

## 2019-10-19 NOTE — Progress Notes (Signed)
Physical Therapy Session Note  Patient Details  Name: Paul [name] MRN: [id number] Date of Birth: [birth year]  Today's Date: 10/19/2019 PT Individual Time: 1000-1045 and 1406-1501 PT Individual Time Calculation (min): 45 min and 55 min  Short Term Goals: Week 4:  PT Short Term Goal 1 (Week 4): STG = LTG due to estimated d/c date.  Skilled Therapeutic Interventions/Progress Updates:  Treatment 1: Pt received in bed with mother & brother arriving shortly after. Session focused on caregiver training with therapist educating family on pt's absent sensation in [place] and decreased attention to L side of body & environment. Provided demo & instruction for donning/doffing L hand splint & PRAFO with family return demonstrating; educated them on importance of using devices. Reviewed bed mobility with pt rolling L<>R with bed rails, bed flat, and supervision, and transferring supine<>sit to both sides of bed with CGA sit>supine, min assist supine>sit with pt requiring only min assist for LUE management & occasional cuing to hook LLE with RLE. Pt's family donned B socks & shoes total assist. Therapist provides demonstration for squat pivot transfer bed<>w/c with cuing for overall technique, blocking L knee, and facilitating pivot with pt's brother return demonstrating; pt requires min assist overall. Therapist instructs family on w/c parts management (armrest, leg rest, lap tray, & brakes). Pt propels w/c around nurses station & back to room with R hemi technique & supervision. Educated family on f/u HHPT with progression to OPPT in the future. Pt's brother asking about pt using treadmill with therapist educating pt & family that pt will only be completing transfers with them at this time & will go home at w/c level. Also discussed pt's HA's and hope that they will steadily improve over time. Pt left in w/c in handoff to OT.  Educated pt & family on recommendation of 24 hr supervision at d/c.  Pain: 7/10 HA,  nurse administered meds at end of session   Treatment 2: Pt received in w/c with nursing student present & administering meds (pt reports 4/10 HA). Therapist dons L GRAFO & shoe cover in preparation for gait training. Pt propels w/c around unit with R hemi technique & supervision. Sit<>stand with min assist & pt ambulates 35 ft with hemiwalker & max assist + w/c follow for safety. Pt requires slightly less cuing for sequencing gait pattern & hemiwalker on this date, but still total assist for LLE advancement (pt appears to have some extensor tone noted in LLE which is new to this therapist), with therapist also providing manual facilitation for weight shifting R to allow increased ease of LLE foot advancement. Pt notes significant fatigue during session so pt engaged in dynavision from w/c level with fairly equal reaction time to L & R side of board (0.4 seconds slower on L) progressing to task of only pressing green lights, no red with pt able to do so accurately with 0.15 second slower reaction time to L side of board compared to R. Pt returned to room & transfers w/c>bed with squat pivot min assist & returns to supine with assist 2/2 fatigue. Pt reports need to void bladder & therapist assists with positioning urinal & pt with continent void. Therapist doffs GRAFO & B shoes & pt left in bed with alarm set, call bell in reach.  Pt reports he's most looking forward to cooking when he goes home - therapist educates him that he's not safe to cook at this time but can provide instruction to family members as they meal prep.  Therapy Documentation Precautions:  Precautions Precautions: Fall Precaution Comments: L hemi, SBP <160 Restrictions Weight Bearing Restrictions: No Other Position/Activity Restrictions: L PRAFO and WHO donned at rest   Therapy/Group: Individual Therapy  [name] 10/19/2019, 3:02 PM

## 2019-10-19 NOTE — Progress Notes (Signed)
Physical Therapy Discharge Summary  Patient Details  Name: Paul [name] MRN: [id number] Date of Birth: 08/29/1965  Today's Date: 10/20/2019   Patient has met 10 of 10 long term goals due to improved activity tolerance, improved balance, improved postural control, increased strength, decreased pain, ability to compensate for deficits and improved awareness.  Patient to discharge at a wheelchair level supervision for w/c mobility, min<>mod assist for squat pivot transfers.     Patient's care partner completed formal family education prior to d/c with focus on hands on family education.   Reasons goals not met: n/a  Recommendation:  Patient will benefit from ongoing skilled PT services in home health setting to continue to advance safe functional mobility, address ongoing impairments in L NMR, strength, balance, endurance, midline orientation, trunk control, transfers, gait, w/c mobility, cognition, and minimize fall risk.  Equipment: hospital bed, w/c with half lap tray  Reasons for discharge: treatment goals met and discharge from hospital  Patient/family agrees with progress made and goals achieved: Yes  PT Discharge Precautions/Restrictions Precautions Precautions: Fall Precaution Comments: L hemi, SBP <160 Restrictions Weight Bearing Restrictions: No  Vision/Perception  Pt wears glasses for reading only at baseline.  Decreased attention to L side of body & L environment.  Cognition Overall Cognitive Status: Impaired/Different from baseline Arousal/Alertness: Awake/alert Orientation Level: Oriented X4 Sustained Attention: Impaired Sustained Attention Impairment: Functional basic;Verbal basic Memory: Impaired Memory Impairment: Decreased recall of new information Awareness: Impaired Awareness Impairment: Anticipatory impairment;Emergent impairment Problem Solving: Impaired Safety/Judgment: Impaired   Sensation Sensation Light Touch: (pt with absent sensation in LUE &  LLE when tested through light touch) Light Touch Impaired Details: Absent LLE;Absent LUE Proprioception: Impaired Detail Proprioception Impaired Details: Absent LUE;Absent LLE Coordination Gross Motor Movements are Fluid and Coordinated: No Fine Motor Movements are Fluid and Coordinated: No Coordination and Movement Description: dense L hemiparesis  Motor  Motor Motor: Abnormal postural alignment and control Motor - Discharge Observations: L hemiparesis   Mobility Bed Mobility Bed Mobility: Rolling Right;Rolling Left;Supine to Sit;Sit to Supine(bed flat with bed rails in hospital bed) Rolling Right: Supervision/verbal cueing Rolling Left: Supervision/Verbal cueing Supine to Sit: Minimal Assistance - Patient > 75% Sit to Supine: Minimal Assistance - Patient > 75% Transfers Transfers: Sit to [employer] Transfers Sit to Stand: Minimal Assistance - Patient > 75% Squat Pivot Transfers: Minimal Assistance - Patient > 75%(cuing for sequencing, manual facilitation for pivot & placement, blocking L knee) Transfer (Assistive device): None   Locomotion  Gait Ambulation: Yes Gait Assistance: Maximal Assistance - Patient 25-49%(max assist + w/c follow for safety) Gait Distance (Feet): 35 Feet Assistive device: Hemi-walker(used GRAFO to prevent L knee buckling in stance phase & shoe cover for increased ease of L foot advancement) Gait Assistance Details: verbal cuing for sequencing gait pattern with hemi walker, total assist for LLE foot advancement, manual faciltiation for weight shifting R, cuing for upright posture Gait Gait velocity: decreased Stairs / Additional Locomotion Stairs: No Wheelchair Mobility Wheelchair Mobility: Yes Wheelchair Propulsion: Right upper extremity;Right lower extremity Wheelchair Parts Management: Needs assistance Distance: 150 ft   Trunk/Postural Assessment  Cervical Assessment Cervical Assessment: Exceptions to WFL(slight forward head) Thoracic  Assessment Thoracic Assessment: Exceptions to WFL(slight rounding of shoulders) Lumbar Assessment Lumbar Assessment: Exceptions to WFL(slight posterior pelvic tilt) Postural Control Postural Control: Deficits on evaluation Trunk Control: impaired midline orientation, pt will at times push L with RUE Righting Reactions: delayed Protective Responses: delayed   Balance Balance Balance Assessed: Yes Static Sitting Balance Static Sitting -  Balance Support: Right upper extremity supported;Feet supported Static Sitting - Level of Assistance: (close supervision)  Dynamic Sitting Balance Dynamic Sitting - Balance Support: Feet supported;Right upper extremity supported;During functional activity Dynamic Sitting - Level of Assistance: (min assist during squat pivot transfer)  Dynamic Standing Balance Dynamic Standing - Balance Support: During functional activity;Right upper extremity supported Dynamic Standing - Level of Assistance: Mod A if standing with 1 UE support. Max assist during gait Dynamic Standing - Balance Activities: (during gait with hemiwalker)    Extremity Assessment  RUE Assessment RUE Assessment: Within Functional Limits     RLE Assessment RLE Assessment: Within Functional Limits LLE Assessment Passive Range of Motion (PROM) Comments: [facility] General Strength Comments: pt with some activation noted in L hip flexors & extensors in standing gait, but otherwise no active movement noted   Lavone Nian, PT, DPT 10/19/2019, 3:17 PM  Allayne Gitelman 10/20/2019, 12:30 PM

## 2019-10-19 NOTE — Progress Notes (Signed)
Social Work [name], male   DOB: [birth year], 54 y.o.   MRN: [id number]    SW met with pt and pt mother in room to discuss DME needs and HH. SW informed pt brother Paul [name] will be listed as primary contact for DME and delivery of items. SW also informed on [facility] and will inform once there is an agency. SW informed there will be a change in d/c location as pt will now go to his son Paul [name]'s home instead. SW to follow-up with Paul [name] to discuuss d/c further (his name is listed as [name] under contacts).  SW spoke with [name]/[facility] [phone]) to inquire about charity care for HHPT/OT/ST. SW waiting on follow-up. SW left message for [name] [phone]) to inform on charity care referral sent.     Loralee Pacas, MSW, [place] Office: [phone] Cell: [phone] Fax: [phone]

## 2019-10-19 NOTE — Progress Notes (Signed)
[place] PHYSICAL MEDICINE & REHABILITATION PROGRESS NOTE  Subjective/Complaints: Continues to have headache. Eating breakfast. Sleeping better at night. Had BM yesterday.  ROS: Patient denies fever, rash, sore throat, blurred vision, nausea, vomiting, diarrhea, cough, shortness of breath or chest pain, joint or back pain, headache, or mood change.   Objective:   No results found. Recent Labs    10/17/19 0550  WBC 6.3  HGB 11.5*  HCT 35.3*  PLT 414*   Recent Labs    10/17/19 0550  NA 140  K 4.1  CL 105  CO2 25  GLUCOSE 115*  BUN 15  CREATININE 0.85  CALCIUM 9.5    Intake/Output Summary (Last 24 hours) at 10/19/2019 [id number] Last data filed at 10/19/2019 [id number] Gross per 24 hour  Intake 100 ml  Output 350 ml  Net -250 ml     Physical Exam: Vital Signs Blood pressure 132/84, pulse (!) 55, temperature 98.1 F (36.7 C), temperature source Oral, resp. rate 18, height 6' (1.829 m), weight 76 kg, SpO2 99 %. Constitutional: No distress . Vital signs reviewed. Sitting up in bed eating breakfast.  HEENT: EOMI, oral membranes moist Neck: supple Cardiovascular: RRR without murmur. No JVD    Respiratory/Chest: CTA Bilaterally without wheezes or rales. Normal effort    GI/Abdomen: BS +, non-tender, non-distended Ext: no clubbing, cyanosis, or edema Psych: flat but a little more engaging Skin: No evidence of breakdown, no evidence of rash Neurologic: 0/5 LUE, 1/5 HE LLE, absent sensation LUE and LLE  Musculoskeletal: Full range of motion in all 4 extremities. No joint swelling CN7 and 12.  Assessment/Plan: 1. Functional deficits secondary to right thalamic hemorrhage which require 3+ hours per day of interdisciplinary therapy in a comprehensive inpatient rehab setting.  Physiatrist is providing close team supervision and 24 hour management of active medical problems listed below.  Physiatrist and rehab team continue to assess barriers to discharge/monitor patient progress  toward functional and medical goals  Care Tool:  Bathing    Body parts bathed by patient: Chest, Abdomen, Front perineal area, Right upper leg, Left upper leg, Face   Body parts bathed by helper: Buttocks, Right arm, Left arm, Right lower leg, Left lower leg     Bathing assist Assist Level: Maximal Assistance - Patient 24 - 49%     Upper Body Dressing/Undressing Upper body dressing   What is the patient wearing?: Pull over shirt    Upper body assist Assist Level: Moderate Assistance - Patient 50 - 74%    Lower Body Dressing/Undressing Lower body dressing      What is the patient wearing?: Pants     Lower body assist Assist for lower body dressing: Moderate Assistance - Patient 50 - 74%     Toileting Toileting Toileting Activity did not occur Landscape architect and hygiene only): N/A (no void or bm)  Toileting assist Assist for toileting: Dependent - Patient 0%     Transfers Chair/bed transfer  Transfers assist     Chair/bed transfer assist level: Minimal Assistance - Patient > 75%     Locomotion Ambulation   Ambulation assist   Ambulation activity did not occur: Safety/medical concerns(Increased R hip pain in standing, pt unable to tolerance standing)  Assist level: 2 helpers(max assist + w/c follow for safety) Assistive device: (hemiwalker) Max distance: 10 ft   Walk 10 feet activity   Assist  Walk 10 feet activity did not occur: Safety/medical concerns(Increased R hip pain in standing, pt unable to tolerance standing)  Assist level: 2 helpers Assistive device: Walker-hemi   Walk 50 feet activity   Assist Walk 50 feet with 2 turns activity did not occur: Safety/medical concerns(Increased R hip pain in standing, pt unable to tolerance standing)  Assist level: 2 helpers Assistive device: No Device    Walk 150 feet activity   Assist Walk 150 feet activity did not occur: Safety/medical concerns(Increased R hip pain in standing, pt unable to  tolerance standing)         Walk 10 feet on uneven surface  activity   Assist Walk 10 feet on uneven surfaces activity did not occur: Safety/medical concerns(Increased R hip pain in standing, pt unable to tolerance standing)         Wheelchair     Assist Will patient use wheelchair at discharge?: Yes Type of Wheelchair: Manual    Wheelchair assist level: Supervision/Verbal cueing Max wheelchair distance: 100 ft    Wheelchair 50 feet with 2 turns activity    Assist    Wheelchair 50 feet with 2 turns activity did not occur: Safety/medical concerns(unable without skilled intervention)   Assist Level: Supervision/Verbal cueing   Wheelchair 150 feet activity     Assist  Wheelchair 150 feet activity did not occur: Safety/medical concerns(unable without skilled intervention)   Assist Level: Supervision/Verbal cueing   Blood pressure 132/84, pulse (!) 55, temperature 98.1 F (36.7 C), temperature source Oral, resp. rate 18, height 6' (1.829 m), weight 76 kg, SpO2 99 %.  Medical Problem List and Plan: 1.Left hemiparesis and visual-spatial deficitssecondary to right thalamic hemorrhage with intraventricular extension  Continue CIR  -WHO,PRAFO for LUE and LLE  -working toward dc of 3/26, family ed this week.  2. Antithrombotics:  -DVT/anticoagulation:Mechanical:Sequential compression devices, below kneeBilateral lower extremities   Dopplers negative for DVT -antiplatelet therapy: N/a 3.Headaches/Pain Management:Fioricet or ultram pr[place]aven't helped a great deal.   Depakote 125mg  BID  Topamax  75mg  BID-> increase to 100mg  BID   3/22 headaches under reasonable control   -dc'ed depakote    3/24: states he continues to have headache, but appears comfortable this morning.  4. Mood:LCSW to follow for evaluation and support. -antipsychotic agents: N/A  -appreciate neuropsych input and assistance   -has been voicing feelings of  despair related to his diagnosis  3/15 initiated trial of celexa 10mg  qhs  3/17 team to continue to provide ego support   -may have some cortical contributions to lability/PBA 5. Neuropsych: This patientiscapable of making decisions on hisown behalf.   6. Skin/Wound Care:Routine pressure relief measures. 7. Fluids/Electrolytes/Nutrition:Monitor I/Os.   -hydration has been better 8. HTN: Monitor BP tid-continue Hydralazine, Cozaar, Coreg, Catapres and Norvasc. TItrateas indicated--SBP goal<140. Nanda Quinton has been soft and bradycardic. Will decrease Amlodipine to 5mg  daily.  3/11: Will decrease Amlodipine further to 2.5mg .  3/12: will stop amlodipine.  3/13- bradycardic- decrease losartan to 25 mg BID and monitor- BP soft as well   Vitals:   10/18/19 1408 10/18/19 2022  BP: 125/80 132/84  Pulse: (!) 53 (!) 55  Resp: 17 18  Temp: 98.9 F (37.2 C) 98.1 F (36.7 C)  SpO2: 98% 99%  Controlled 3/24, bradycardia stable 9. Prediabetes: Hgb A1C- 6.2--was 5.7 eight months ago. Will have RD educate on CM diet---      has been well controlled; now off daily CBGs and SSI 10. Tobacco/Cannabis use: Reports that he plans on quitting.  11. Dyslipidemia: Statin on hold due to ICH--resume at discharge. 12.  Post stroke dysphagia: doesn't like consistency but is  tolerating due to nectars so far with supervision  -f/u MBS per SLP  3/18 persistent oropharyngeal dysphagia--no change in diet D2/nectars 13.  AKI  Creatinine 1.18 on 3/18  Encourage fluids  Off IVF for now  3/21: labs holding nicely 15/0.85  3/22 labs WNL 14. Slow transit constipation:  -augment regimen, increase senna-s to 2 tabs bid. Had BM 3/23 15. Urinary retention: resolved   Voiding well with urecholine  -continent/ minimal PVR's for several days now LOS: 25 days A FACE TO FACE EVALUATION WAS PERFORMED  Martha Clan P Caidence Higashi 10/19/2019, 9:21 AM

## 2019-10-19 NOTE — Progress Notes (Signed)
Speech Language Pathology Daily Session Note  Patient Details  Name: Paul [name] MRN: [id number] Date of Birth: 02/27/1966  Today's Date: 10/19/2019 SLP Individual Time: 1130-1205 SLP Individual Time Calculation (min): 35 min  Short Term Goals: Week 4: SLP Short Term Goal 1 (Week 4): STGs=LTGs due to ELOS  Skilled Therapeutic Interventions: Skilled treatment session focused on family education with the patient's brother and mother. SLP facilitated session by providing education in regards to patient's current swallowing function, diet recommendations, appropriate textures, compensatory strategies, medication administration, oral care, water protocol, s/s of aspiration/PNA and how to appropriately thicken liquids. Both verbalized understanding and handouts were given to reinforce information. Patient left upright in wheelchair with alarm on and all needs within reach. Continue with current plan of care.      Pain No/Denies Pain   Therapy/Group: Individual Therapy  Hattie Aguinaldo 10/19/2019, 12:40 PM

## 2019-10-19 NOTE — Progress Notes (Signed)
Occupational Therapy Session Note  Patient Details  Name: Paul [name] MRN: [id number] Date of Birth: 1966/06/05  Today's Date: 10/19/2019 OT Individual Time: 1045-1130 OT Individual Time Calculation (min): 45 min    Short Term Goals: Week 1:  OT Short Term Goal 1 (Week 1): Pt will don overhead shirt with Mod A OT Short Term Goal 1 - Progress (Week 1): Met OT Short Term Goal 2 (Week 1): Pt will complete BSC or toilet transfer with 1 assist OT Short Term Goal 2 - Progress (Week 1): Met OT Short Term Goal 3 (Week 1): Pt will complete shower transfer with 1 assist OT Short Term Goal 3 - Progress (Week 1): Progressing toward goal  Skilled Therapeutic Interventions/Progress Updates:    1;1. Pt received in w/c with PT finishing. Pt and OT demo washing up at sink, sit to stand, body mechanics, clothing management, hemi dressing and set up for transfers to BSC/shower bench. Brother able to return demo sit to stand and transfer techniques. Reviewed strategies for bathing and dressing at bed level for energy conservation as needed as well as lateral leans for toileting. Pt mother declines hands on A as she will likely only be supervision from cognitive not physical standpoint. Mother demo decreased memory throughout session perseverating on patient showering and washing hair. Brother feels comfortable educating wife and son. family aware pt will need close S or physical A 24/7. All questions answered and brother able to demo teach back of methods learned. Exited session with pt seated in w/c awating SLP with belt alarm on and call light tin reach  Therapy Documentation Precautions:  Precautions Precautions: Fall Precaution Comments: L hemi, SBP <160 Restrictions Weight Bearing Restrictions: No Other Position/Activity Restrictions: L PRAFO and WHO donned at rest General:   Vital Signs:  Pain:   ADL: ADL Eating: Not assessed Grooming: Moderate assistance Where Assessed-Grooming: Edge of  bed Upper Body Bathing: Moderate assistance Where Assessed-Upper Body Bathing: Edge of bed Lower Body Bathing: Maximal assistance Where Assessed-Lower Body Bathing: Edge of bed Upper Body Dressing: Maximal assistance Where Assessed-Upper Body Dressing: Edge of bed Lower Body Dressing: Maximal assistance Where Assessed-Lower Body Dressing: Edge of bed Toileting: Not assessed Toilet Transfer: Not assessed Tub/Shower Transfer: Not assessed Vision Baseline Vision/History: Wears glasses Wears Glasses: Reading only Perception    Praxis   Exercises:   Other Treatments:     Therapy/Group: Individual Therapy  Tonny Branch 10/19/2019, 11:29 AM

## 2019-10-20 ENCOUNTER — Inpatient Hospital Stay (HOSPITAL_COMMUNITY): Payer: Self-pay | Admitting: Occupational Therapy

## 2019-10-20 ENCOUNTER — Inpatient Hospital Stay (HOSPITAL_COMMUNITY): Payer: Self-pay

## 2019-10-20 ENCOUNTER — Ambulatory Visit (HOSPITAL_COMMUNITY): Payer: Self-pay

## 2019-10-20 ENCOUNTER — Encounter (HOSPITAL_COMMUNITY): Payer: Self-pay | Admitting: Speech Pathology

## 2019-10-20 ENCOUNTER — Encounter (HOSPITAL_COMMUNITY): Payer: Self-pay | Admitting: Occupational Therapy

## 2019-10-20 NOTE — Progress Notes (Signed)
[place] PHYSICAL MEDICINE & REHABILITATION PROGRESS NOTE  Subjective/Complaints: In good spirits. Headache present but better. Satisfied with pain control.   ROS: Patient denies fever, rash, sore throat, blurred vision, nausea, vomiting, diarrhea, cough, shortness of breath or chest pain, joint or back pain,  or mood change.    Objective:   No results found. No results for input(s): WBC, HGB, HCT, PLT in the last 72 hours. No results for input(s): NA, K, CL, CO2, GLUCOSE, BUN, CREATININE, CALCIUM in the last 72 hours.  Intake/Output Summary (Last 24 hours) at 10/20/2019 1238 Last data filed at 10/20/2019 [id number] Gross per 24 hour  Intake 350 ml  Output 100 ml  Net 250 ml     Physical Exam: Vital Signs Blood pressure 137/78, pulse (!) 51, temperature 98 F (36.7 C), temperature source Oral, resp. rate 18, height 6' (1.829 m), weight 76 kg, SpO2 98 %. Constitutional: No distress . Vital signs reviewed. HEENT: EOMI, oral membranes moist Neck: supple Cardiovascular: RRR without murmur. No JVD    Respiratory/Chest: CTA Bilaterally without wheezes or rales. Normal effort    GI/Abdomen: BS +, non-tender, non-distended Ext: no clubbing, cyanosis, or edema Psych: pleasant and cooperative Skin: No evidence of breakdown, no evidence of rash Neurologic: 0/5 LUE, 1/5 HE , 0/5 distally LLE, absent sensation LUE and LLE  Musculoskeletal: Full range of motion in all 4 extremities. No joint swelling CN7 and 12 present  Assessment/Plan: 1. Functional deficits secondary to right thalamic hemorrhage which require 3+ hours per day of interdisciplinary therapy in a comprehensive inpatient rehab setting.  Physiatrist is providing close team supervision and 24 hour management of active medical problems listed below.  Physiatrist and rehab team continue to assess barriers to discharge/monitor patient progress toward functional and medical goals  Care Tool:  Bathing    Body parts bathed by  patient: Chest, Abdomen, Front perineal area, Right upper leg, Left upper leg, Face, Buttocks, Right lower leg, Left arm   Body parts bathed by helper: Right arm, Left lower leg     Bathing assist Assist Level: Minimal Assistance - Patient > 75%     Upper Body Dressing/Undressing Upper body dressing   What is the patient wearing?: Pull over shirt    Upper body assist Assist Level: Supervision/Verbal cueing    Lower Body Dressing/Undressing Lower body dressing      What is the patient wearing?: Pants     Lower body assist Assist for lower body dressing: Minimal Assistance - Patient > 75%     Toileting Toileting Toileting Activity did not occur (Clothing management and hygiene only): N/A (no void or bm)  Toileting assist Assist for toileting: Dependent - Patient 0%     Transfers Chair/bed transfer  Transfers assist     Chair/bed transfer assist level: Minimal Assistance - Patient > 75%     Locomotion Ambulation   Ambulation assist   Ambulation activity did not occur: Safety/medical concerns(Increased R hip pain in standing, pt unable to tolerance standing)  Assist level: 2 helpers Assistive device: Photographer) Max distance: 50'   Walk 10 feet activity   Assist  Walk 10 feet activity did not occur: Safety/medical concerns(Increased R hip pain in standing, pt unable to tolerance standing)  Assist level: 2 helpers Assistive device: Walker-hemi   Walk 50 feet activity   Assist Walk 50 feet with 2 turns activity did not occur: Safety/medical concerns(Increased R hip pain in standing, pt unable to tolerance standing)  Assist level: 2 helpers Assistive  device: No Device    Walk 150 feet activity   Assist Walk 150 feet activity did not occur: Safety/medical concerns         Walk 10 feet on uneven surface  activity   Assist Walk 10 feet on uneven surfaces activity did not occur: Safety/medical concerns         Wheelchair     Assist  Will patient use wheelchair at discharge?: Yes Type of Wheelchair: Manual    Wheelchair assist level: Supervision/Verbal cueing Max wheelchair distance: 150 ft    Wheelchair 50 feet with 2 turns activity    Assist    Wheelchair 50 feet with 2 turns activity did not occur: Safety/medical concerns(unable without skilled intervention)   Assist Level: Supervision/Verbal cueing   Wheelchair 150 feet activity     Assist  Wheelchair 150 feet activity did not occur: Safety/medical concerns(unable without skilled intervention)   Assist Level: Supervision/Verbal cueing   Blood pressure 137/78, pulse (!) 51, temperature 98 F (36.7 C), temperature source Oral, resp. rate 18, height 6' (1.829 m), weight 76 kg, SpO2 98 %.  Medical Problem List and Plan: 1.Left hemiparesis and visual-spatial deficitssecondary to right thalamic hemorrhage with intraventricular extension  Continue CIR  -WHO,PRAFO for LUE and LLE  -dc home tomorrow.   -outpt PT, OT after discharge  -Patient to see me in the office for transitional care encounter in 1-2 weeks.   2. Antithrombotics:  -DVT/anticoagulation:Mechanical:Sequential compression devices, below kneeBilateral lower extremities   Dopplers negative for DVT -antiplatelet therapy: N/a 3.Headaches/Pain Management:Fioricet or ultram prnhaven't helped a great deal.   Depakote 125mg  BID  Topamax  75mg  BID-> increase to 100mg  BID   3/22 headaches under reasonable control   -dc'ed depakote    3/25 no changes in meds  4. Mood:LCSW to follow for evaluation and support. -antipsychotic agents: N/A  -appreciate neuropsych input and assistance   -has been voicing feelings of despair related to his diagnosis  3/15 initiated trial of celexa 10mg  qhs  3/17 team to continue to provide ego support   -may have some cortical contributions to lability/PBA 5. Neuropsych: This patientiscapable of making decisions on hisown  behalf.   6. Skin/Wound Care:Routine pressure relief measures. 7. Fluids/Electrolytes/Nutrition:Monitor I/Os.   -hydration has been better 8. HTN: Monitor BP tid-continue Hydralazine, Cozaar, Coreg, Catapres and Norvasc. TItrateas indicated--SBP goal<140. Nanda Quinton has been soft and bradycardic. Will decrease Amlodipine to 5mg  daily.  3/11: Will decrease Amlodipine further to 2.5mg .  3/12: will stop amlodipine.  3/13- bradycardic- decrease losartan to 25 mg BID and monitor- BP soft as well   Vitals:   10/19/19 1946 10/20/19 0338  BP: 124/72 137/78  Pulse: (!) 53 (!) 51  Resp: 18 18  Temp: 98.4 F (36.9 C) 98 F (36.7 C)  SpO2: 95% 98%  Controlled 3/25 ongoing bradycardia 9. Prediabetes: Hgb A1C- 6.2--was 5.7 eight months ago. Will have RD educate on CM diet---      has been well controlled; now off daily CBGs and SSI 10. Tobacco/Cannabis use: Reports that he plans on quitting.  11. Dyslipidemia: Statin on hold due to ICH--resume at discharge. 12.  Post stroke dysphagia: doesn't like consistency but is tolerating due to nectars so far with supervision  -f/u MBS per SLP  3/18 persistent oropharyngeal dysphagia--no change in diet D2/nectars 13.  AKI  Creatinine 1.18 on 3/18  Encourage fluids  Off IVF for now  3/21: labs holding nicely 15/0.85  3/22 labs WNL 14. Slow transit constipation:  -augmented  regimen to senna-s to 2 tabs bid. Had BM 3/23 15. Urinary retention: resolved   Voiding well with urecholine  -continent/ minimal PVR's for several days now  -wean urecholine as outpt LOS: 26 days A FACE TO FACE EVALUATION WAS PERFORMED  Meredith Staggers 10/20/2019, 12:38 PM

## 2019-10-20 NOTE — Progress Notes (Signed)
Occupational Therapy Discharge Summary  Patient Details  Name: Paul [name] MRN: [id number] Date of Birth: Oct 07, 1965  Patient has met 12 of 12 long term goals due to improved activity tolerance, improved balance, postural control, ability to compensate for deficits, functional use of  LEFT upper and LEFT lower extremity, improved attention, improved awareness and improved coordination.  Patient to discharge at [facility] Assist level.  Patient's care partner is independent to provide the necessary physical and cognitive assistance at discharge.    Reasons goals not met: N/A  Recommendation:  Patient will benefit from ongoing skilled OT services in outpatient setting to continue to advance functional skills in the area of BADL, Reduce care partner burden and functional use of L UE.  Equipment: tub transfer bench, drop arm BSCl 16x18 wc w/ 1/2 lap tray, hospital bed   Reasons for discharge: treatment goals met and discharge from hospital  Patient/family agrees with progress made and goals achieved: Yes  OT Discharge Precautions/Restrictions  Precautions Precautions: Fall Restrictions Weight Bearing Restrictions: No ADL ADL Eating: Set up Grooming: Setup Where Assessed-Grooming: Edge of bed Upper Body Bathing: Supervision/safety Where Assessed-Upper Body Bathing: Edge of bed Lower Body Bathing: Minimal assistance Where Assessed-Lower Body Bathing: Edge of bed Upper Body Dressing: Supervision/safety Where Assessed-Upper Body Dressing: Edge of bed Lower Body Dressing: Minimal assistance Where Assessed-Lower Body Dressing: Edge of bed Toileting: Minimal assistance Toilet Transfer: Minimal assistance Tub/Shower Transfer: Minimal assistance Vision Eye Alignment: Within Functional Limits Cognition Orientation Level: Oriented X4 Problem Solving: Appears intact Sequencing: Appears intact Organizing: Appears intact Initiating: Appears intact Safety/Judgment:  Impaired Sensation Sensation Light Touch Impaired Details: Impaired LLE;Impaired LUE Proprioception: Impaired Detail Proprioception Impaired Details: Impaired LUE;Impaired LLE Coordination Gross Motor Movements are Fluid and Coordinated: No Fine Motor Movements are Fluid and Coordinated: No Coordination and Movement Description: L hemiplegia Motor  Motor Motor: Abnormal postural alignment and control;Hemiplegia Motor - Discharge Observations: L hemiparesis Mobility  Bed Mobility Bed Mobility: Rolling Right;Rolling Left;Supine to Sit;Sit to Supine(bed flat with bed rails in hospital bed) Rolling Right: Supervision/verbal cueing Rolling Left: Supervision/Verbal cueing Supine to Sit: Minimal Assistance - Patient > 75% Sit to Supine: Minimal Assistance - Patient > 75% Transfers Sit to Stand: Minimal Assistance - Patient > 75% Stand to Sit: Minimal Assistance - Patient > 75%  Balance Balance Balance Assessed: Yes Static Sitting Balance Static Sitting - Balance Support: Right upper extremity supported;Feet supported Static Sitting - Level of Assistance: 5: Stand by assistance Dynamic Sitting Balance Dynamic Sitting - Balance Support: During functional activity Dynamic Sitting - Level of Assistance: 5: Stand by assistance Static Standing Balance Static Standing - Balance Support: Right upper extremity supported;During functional activity Static Standing - Level of Assistance: 4: Min assist Dynamic Standing Balance Dynamic Standing - Balance Support: Right upper extremity supported;During functional activity Dynamic Standing - Level of Assistance: 4: Min assist;3: Mod assist Extremity/Trunk Assessment RUE Assessment RUE Assessment: Within Functional Limits LUE Assessment LUE Assessment: Exceptions to [facility] LUE Body System: Neuro Brunstrum levels for arm and hand: Arm;Hand Brunstrum level for arm: Stage II Synergy is developing;Stage III Synergy is performed voluntarily Brunstrum  level for hand: Stage I Flaccidity LUE Tone LUE Tone: Mild   Daneen Schick Zyanna Leisinger 10/20/2019, 3:43 PM

## 2019-10-20 NOTE — Progress Notes (Signed)
Occupational Therapy Session Note  Patient Details  Name: Paul [name] MRN: [id number] Date of Birth: 21-Jul-1966  Today's Date: 10/20/2019 OT Individual Time: 4627-0350 OT Individual Time Calculation (min): 45 min    Short Term Goals: Week 1:  OT Short Term Goal 1 (Week 1): Pt will don overhead shirt with Mod A OT Short Term Goal 1 - Progress (Week 1): Met OT Short Term Goal 2 (Week 1): Pt will complete BSC or toilet transfer with 1 assist OT Short Term Goal 2 - Progress (Week 1): Met OT Short Term Goal 3 (Week 1): Pt will complete shower transfer with 1 assist OT Short Term Goal 3 - Progress (Week 1): Progressing toward goal  Skilled Therapeutic Interventions/Progress Updates:    1:1. Pt received inbed agreeable to showering. All transfers completed at min A level with VC for foot placemetn prior to transfer. Pt completes shower with HOH A to wash RUE with LUE. CGA for sitting balance. Pt dresses with mod VC for hemi strategies to don shirt  And MIN A for LB dressing after installing shoe buttons sit to stand. A provided throughout session to maintain LLE in seated figure 4. Pt able to place LE in position but not maintain. Pt completes sit to stand with MIN A with knee block on LLE at sink for pt to complete clothing management. Exited session with pt seated in bed, exit alarm on and call lightin reach.  Therapy Documentation Precautions:  Precautions Precautions: Fall Precaution Comments: L hemi, SBP <160 Restrictions Weight Bearing Restrictions: No Other Position/Activity Restrictions: L PRAFO and WHO donned at rest General:   Vital Signs:  Pain: Pain Assessment Pain Scale: 0-10 Pain Score: 2  Pain Type: Acute pain Pain Location: Head Pain Descriptors / Indicators: Headache Pain Frequency: Constant Pain Onset: On-going Patients Stated Pain Goal: 4 Pain Intervention(s): Rest;Medication (See eMAR) ADL: ADL Eating: Not assessed Grooming: Moderate assistance Where  Assessed-Grooming: Edge of bed Upper Body Bathing: Moderate assistance Where Assessed-Upper Body Bathing: Edge of bed Lower Body Bathing: Maximal assistance Where Assessed-Lower Body Bathing: Edge of bed Upper Body Dressing: Maximal assistance Where Assessed-Upper Body Dressing: Edge of bed Lower Body Dressing: Maximal assistance Where Assessed-Lower Body Dressing: Edge of bed Toileting: Not assessed Toilet Transfer: Not assessed Tub/Shower Transfer: Not assessed Vision   Perception    Praxis   Exercises:   Other Treatments:     Therapy/Group: Individual Therapy  Tonny Branch 10/20/2019, 8:46 AM

## 2019-10-20 NOTE — Plan of Care (Signed)
  Problem: RH Eating Goal: LTG Patient will perform eating w/assist, cues/equip (OT) Description: LTG: Patient will perform eating with assist, with/without cues using equipment (OT) Outcome: Completed/Met   Problem: RH Grooming Goal: LTG Patient will perform grooming w/assist,cues/equip (OT) Description: LTG: Patient will perform grooming with assist, with/without cues using equipment (OT) Outcome: Completed/Met   Problem: RH Bathing Goal: LTG Patient will bathe all body parts with assist levels (OT) Description: LTG: Patient will bathe all body parts with assist levels (OT) Outcome: Completed/Met   Problem: RH Dressing Goal: LTG Patient will perform upper body dressing (OT) Description: LTG Patient will perform upper body dressing with assist, with/without cues (OT). Outcome: Completed/Met Goal: LTG Patient will perform lower body dressing w/assist (OT) Description: LTG: Patient will perform lower body dressing with assist, with/without cues in positioning using equipment (OT) Outcome: Completed/Met   Problem: RH Toileting Goal: LTG Patient will perform toileting task (3/3 steps) with assistance level (OT) Description: LTG: Patient will perform toileting task (3/3 steps) with assistance level (OT)  Outcome: Completed/Met   Problem: RH Functional Use of Upper Extremity Goal: LTG Patient will use RT/LT upper extremity as a (OT) Description: LTG: Patient will use right/left upper extremity as a stabilizer/gross assist/diminished/nondominant/dominant level with assist, with/without cues during functional activity (OT) Outcome: Completed/Met   Problem: RH Toilet Transfers Goal: LTG Patient will perform toilet transfers w/assist (OT) Description: LTG: Patient will perform toilet transfers with assist, with/without cues using equipment (OT) Outcome: Completed/Met   Problem: RH Tub/Shower Transfers Goal: LTG Patient will perform tub/shower transfers w/assist (OT) Description: LTG:  Patient will perform tub/shower transfers with assist, with/without cues using equipment (OT) Outcome: Completed/Met   Problem: Sit to Stand Goal: LTG:  Patient will perform sit to stand in prep for activites of daily living with assistance level (OT) Description: LTG:  Patient will perform sit to stand in prep for activites of daily living with assistance level (OT) Outcome: Completed/Met   Problem: RH Balance Goal: LTG: Patient will maintain dynamic sitting balance (OT) Description: LTG:  Patient will maintain dynamic sitting balance with assistance during activities of daily living (OT) Outcome: Completed/Met Goal: LTG Patient will maintain dynamic standing with ADLs (OT) Description: LTG:  Patient will maintain dynamic standing balance with assist during activities of daily living (OT)  Outcome: Completed/Met

## 2019-10-20 NOTE — Progress Notes (Addendum)
Social Work [name], male   DOB: 1966/07/16, 54 y.o.   MRN: [id number]   SW followed up with pt son Wilfred Lacy [phone]) to discuss pt now discharging to his home. He reports pt will move into his home, and there will always be someone available to provide 24/7 care. SW informed on DME services that will be delivered to the home from [facility] and they will follow-up with him. SW discussed HHPT/OT/ST and SW will follow-up to inform on agency.   SW received updates from [place] [phone]) for charity HHPT/OT/ST and was informedpt will only have PT/ST due to no staffing at this time for OT. SW provided updates to medical team. Preference for pt to have outpatient therapies so he can have all three disciplines. SW met with pt brother Leroy Sea and mother in the room to discuss updates on [facility] vs OPPT. Family amenable to outpatient therapies. SW encouraged family to discuss financial assistance with the outpatient clinic.  SW faxed OPPT/OT/ST referral to [facility] ([phone]/f:[phone]). SW spoke with Arbie Cookey to inform on referral and discuss pt need for financial assistance.   SW returned phone call to pt son Wilfred Lacy who reported he had not received updates from [place]. SW provided him with contact information, and informed they would be reaching out to him. SW also provided updates on above with regard to OPPT and how pt will need transportation to/from appointments, and will need to discuss financial assistance.   New patient appointment on Thursday, April 15 at 9:30am with Dr. Dierdre Searles at [facility] [phone]).  SW spoke with Latina Craver [phone]) who will assist with pt getting MATCH.   Loralee Pacas, MSW, [place] Office: [phone] Cell: [phone] Fax: [phone]

## 2019-10-20 NOTE — Progress Notes (Signed)
Physical Therapy Session Note  Patient Details  Name: Paul [name] MRN: [id number] Date of Birth: [birth year]  Today's Date: 10/20/2019 PT Individual Time: 0955-1050 PT Individual Time Calculation (min): 55 min   Short Term Goals: Week 4:  PT Short Term Goal 1 (Week 4): STG = LTG due to estimated d/c date.  Skilled Therapeutic Interventions/Progress Updates:    Session focused on patient and family education in preparation for discharge with patient's mother and brother. Reviewed education from  [place] family education session. Performed hands on training for real car transfer to SUV with PT demonstrating initial transfer for stand pivot with focus on wc parts management, body positioning, and safety with actual transfer including placement of LLE and management of LUE. Pt's brother performed return demonstration x 2 reps with cues for appropriate technique and education on hand placement on hips/trunk vs LUE and risk of LUE dislocation. Verbalized understanding. Pt's mother states she won't be performing car transfers so did not do hands on but observed. Pt performed w/c mobility outside in community environment with supervision on uneven surface but required min assist on incline and cues for technique x 150'. Once back in room focused on patient's mother performing hands on transfers for squat pivot w/c <> bed and bed mobility. Pt directing transfer but pt's mother requiring reinforcement from PT and will benefit from a second person to follow behind to assist for w/c parts management and safe set-up. Pt able to perform sit to supine with supervision using hooking technique but required CGA to get back to sitting due to management of LUE and due to decreased balance. Left in care of family for next [employer].   Therapy Documentation Precautions:  Precautions Precautions: Fall Precaution Comments: L hemi, SBP <160 Restrictions Weight Bearing Restrictions: No Other  Position/Activity Restrictions: L PRAFO and WHO donned at rest Pain: Reports 3/10 headache and RN made aware. Administered pain medication.     Therapy/Group: Individual Therapy  Canary Brim Ivory Broad, PT, DPT, CBIS  10/20/2019, 12:22 PM

## 2019-10-20 NOTE — Progress Notes (Signed)
Occupational Therapy Session Note  Patient Details  Name: Paul [name] MRN: [id number] Date of Birth: [birth year]  Today's Date: 10/20/2019  Session 1 OT Individual Time: 1045-1130 OT Individual Time Calculation (min): 45 min   Session 2 OT Individual Time: 1415-1500 OT Individual Time Calculation (min): 45 min    Short Term Goals: Week 4:  OT Short Term Goal 1 (Week 4): LTG=STG 2/2 ELOS  Skilled Therapeutic Interventions/Progress Updates:  Session 1   Pt greeted sitting in wc with brother and mother present for family education. OT reviewed transfers with brother who feels confident in these. OT provided education and prescription signed by MD for purchase of Saebo e-stim. OT printed out education on [place] and provided demonstration to family. Discussed proper use including positioning and timing, as well as contraindications and skin protection. Had brother demo pad placement and adjusting e-stim output. OT educated on use of shoe buttons and pt's level of independence with self-care tasks. Pt brought down to therapy gym and worked on L UE NMR in gravity eliminated sidelying position. Pt with more flexor synergy noted today in gravity eliminated position. OT placed SAEBO e-stim on wrist extensors. SAEBO left on for 60 minutes. OT returned to remove SAEBO with skin intact and no adverse reactions.  Saebo Stim One 330 pulse width 35 Hz pulse rate On 8 sec/ off 8 sec Ramp up/ down 2 sec Symmetrical Biphasic wave form  Max intensity 117m at 500 Ohm load  Pt returned to room at end of session for handoff to SLP.   Session 2 Pt greeted seated in wc and agreeable to OT treatment session. Pt propelled wc to therapy gym with supervision. Worked on L NMR with standing weight bearing towel pushes in high-low table. Pt with increased activation of shoulder/scapula. Blocked practice for L forearm pronation with pt able to demonstrate trace activation to initiate pronation after hand placed in  supination.OT educated on positioning and protection of L UE shoulder/elbow joint. Pt returned to room and stood at the sink with min A, then was able to place urinal to pee in standing with OT providing min/mod A for standing balance. Pt pivoted back to bed at end of session with Min A. Pt left semi-reclined in bed with bed alarm on, call bell in reach, and needs met.   Therapy Documentation Precautions:  Precautions Precautions: Fall Precaution Comments: L hemi, SBP <160 Restrictions Weight Bearing Restrictions: No Other Position/Activity Restrictions: L PRAFO and WHO donned at rest Pain: Pain Assessment Pain Scale: 0-10 Pain Score: 3  Pain Type: Acute pain Pain Location: Head Pain Orientation: Anterior;Posterior Pain Descriptors / Indicators: Headache Pain Frequency: Constant Pain Onset: On-going Patients Stated Pain Goal: 2 Pain Intervention(s): Repositioned   Therapy/Group: Individual Therapy  EValma Cava3/25/2021, 3:40 PM

## 2019-10-20 NOTE — Progress Notes (Signed)
Speech Language Pathology Discharge Summary  Patient Details  Name: Paul [name] MRN: [id number] Date of Birth: 03-29-1966  Today's Date: 10/20/2019 SLP Individual Time: 1125-1205 SLP Individual Time Calculation (min): 40 min   Skilled Therapeutic Interventions:  Skilled treatment session focused on completion of patient and family education with the patient's mother and brother. Both were educated on proper use of RMT devices and strategies to utilize at home to maximize attention, problem solving and recall with use of strategies. Both were also educated on how to minimize poor frustration tolerance. Patient's mother appeared frustrated/defensive by education SLP was providing in regards to cognitive/personality changes since stroke. However, patient's brother present and both him and the patient agreed and verbalized understanding of all information. Patient left upright in wheelchair with family present. Continue with current plan of care.   Patient has met 6 of 7 long term goals.  Patient to discharge at overall Min;Supervision level.   Reasons goals not met: Patient remains on Dys. 2 textures with nectar-thick liquids with continued intermittent overt s/s of aspiration   Clinical Impression/Discharge Summary: Patient has made functional gains and has met 6 of 7 LTGs this admission. Currently, patient is consuming Dys. 2 textures with nectar-thick liquids with intermittent overt s/s of aspiration and overall supervision level verbal cues for use of swallowing compensatory strategies. Patient is also participating in the water protocol via tsp with continued intermittent overt s/s of aspiration. Therefore, patient is not ready for a repeat MBS at this time. Patient continues to demonstrate decreased speech intelligibility at the sentence level due to imprecise consonants from moderate oral-motor weakness and decreased vocal intensity. However, with supervision level verbal cues and the initiation of  RMT, patient demonstrates improved intelligibility at the sentence level with overall 90% accuracy. Patient also demonstrates improved cognitive functioning nad requires overall Min A verbal cues to complete functional and familiar tasks safely in regards to problem solving, attention and recall with use of strategies. However, patient demonstrates poor frustration tolerance which can impact his safety and overall cognitive functioning at times. Patient and family education is complete and patient will discharge home with 24 hour supervision from family. Patient would benefit from f/u SLP services to maximize his cognitive, swallowing and speech function in order to reduce caregiver burden.   Care Partner:  Caregiver Able to Provide Assistance: Yes  Type of Caregiver Assistance: Physical;Cognitive  Recommendation:  Home Health SLP;24 hour supervision/assistance  Rationale for SLP Follow Up: Maximize functional communication;Maximize cognitive function and independence;Maximize swallowing safety;Reduce caregiver burden   Equipment: Thickener   Reasons for discharge: Discharged from hospital   Patient/Family Agrees with Progress Made and Goals Achieved: Yes    [place], [place] 10/20/2019, 3:37 PM

## 2019-10-21 ENCOUNTER — Other Ambulatory Visit: Payer: Self-pay | Admitting: Physical Medicine and Rehabilitation

## 2019-10-21 MED ORDER — TOPIRAMATE 100 MG PO TABS: 100.0000 mg | ORAL_TABLET | Freq: Two times a day (BID) | ORAL | 0 refills | Status: DC

## 2019-10-21 MED ORDER — BUTALBITAL-APAP-CAFFEINE 50-325-40 MG PO TABS: 1.0000 | ORAL_TABLET | Freq: Three times a day (TID) | ORAL | 0 refills | Status: DC | PRN

## 2019-10-21 MED ORDER — SENNOSIDES-DOCUSATE SODIUM 8.6-50 MG PO TABS: 2.0000 | ORAL_TABLET | Freq: Two times a day (BID) | ORAL | 0 refills | Status: DC

## 2019-10-21 MED ORDER — LOSARTAN POTASSIUM 25 MG PO TABS: 25.0000 mg | ORAL_TABLET | Freq: Two times a day (BID) | ORAL | 0 refills | Status: DC

## 2019-10-21 MED ORDER — DIVALPROEX SODIUM 125 MG PO DR TAB: 125.0000 mg | DELAYED_RELEASE_TABLET | Freq: Two times a day (BID) | ORAL | 0 refills | Status: DC

## 2019-10-21 MED ORDER — RESOURCE THICKENUP CLEAR PO POWD: 1.0000 g | ORAL | 1 refills | Status: DC | PRN

## 2019-10-21 MED ORDER — CARVEDILOL 6.25 MG PO TABS: 6.2500 mg | ORAL_TABLET | Freq: Two times a day (BID) | ORAL | 0 refills | Status: DC

## 2019-10-21 MED ORDER — PANTOPRAZOLE SODIUM 40 MG PO TBEC: 40.0000 mg | DELAYED_RELEASE_TABLET | Freq: Every day | ORAL | 1 refills | Status: DC

## 2019-10-21 MED ORDER — CLONIDINE HCL 0.1 MG PO TABS: 0.1000 mg | ORAL_TABLET | Freq: Three times a day (TID) | ORAL | 0 refills | Status: DC

## 2019-10-21 MED ORDER — BETHANECHOL CHLORIDE 25 MG PO TABS: 25.0000 mg | ORAL_TABLET | Freq: Three times a day (TID) | ORAL | 0 refills | Status: DC

## 2019-10-21 MED ORDER — TRAZODONE HCL 50 MG PO TABS: 25.0000 mg | ORAL_TABLET | Freq: Every evening | ORAL | 0 refills | Status: DC | PRN

## 2019-10-21 MED ORDER — TRAMADOL HCL 50 MG PO TABS: 50.0000 mg | ORAL_TABLET | Freq: Four times a day (QID) | ORAL | 0 refills | Status: DC | PRN

## 2019-10-21 MED ORDER — HYDRALAZINE HCL 100 MG PO TABS: 100.0000 mg | ORAL_TABLET | Freq: Three times a day (TID) | ORAL | 0 refills | Status: DC
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
## 2019-10-21 NOTE — Discharge Instructions (Signed)
Inpatient Rehab Discharge Instructions  Paul [name] Discharge date and time:  10/21/19  Activities/Precautions/ Functional Status: Activity: no lifting, driving, or strenuous exercise till cleared by MD Diet: cardiac diet and diabetic diet Wound Care: none needed   Functional status:  ___ No restrictions     ___ Walk up steps independently _X__ 24/7 supervision/assistance   ___ Walk up steps with assistance ___ Intermittent supervision/assistance  ___ Bathe/dress independently ___ Walk with walker     ___ Bathe/dress with assistance ___ Walk Independently    ___ Shower independently ___ Walk with assistance    _X__ Shower with assistance _X__ No alcohol     ___ Return to work/school ________   COMMUNITY REFERRALS UPON DISCHARGE:   Outpatient: PT   OT   ST                 Agency: [facility] Phone: [phone]              Appointment Date/Time:*Please expect follow-up within 3-5 business days to schedule your visit. If you have not received follow-up be sure to contact the agency*  Medical Equipment/Items Ordered: hospital bed, tub bench, wheelchair [phone]), drop arm bedside commode (family to purchase this item)                                                 Agency/Supplier: [facility] PATIENT/FAMILY: New patient appointment:  Thursday, April 15 at 9:30am  Dr. Dierdre Searles  [facility] [place] [address] [place], [place] [phone]   Special Instructions:  STROKE/TIA DISCHARGE INSTRUCTIONS SMOKING Cigarette smoking nearly doubles your risk of having a stroke & is the single most alterable risk factor  If you smoke or have smoked in the last 12 months, you are advised to quit smoking for your health.  Most of the excess cardiovascular risk related to smoking disappears within a year of stopping.  Ask you doctor about anti-smoking medications  [place]  Quit Line: 1-800-QUIT NOW  Free Smoking Cessation Classes [phone]  CHOLESTEROL Know your levels; limit fat & cholesterol in your diet  Lipid Panel     Component Value Date/Time   CHOL 208 (H) 09/19/2019 0425   TRIG 197 (H) 09/19/2019 1214   HDL 31 (L) 09/19/2019 0425   CHOLHDL 6.7 09/19/2019 0425   VLDL 25 09/19/2019 0425   LDLCALC 152 (H) 09/19/2019 0425      Many patients benefit from treatment even if their cholesterol is at goal.  Goal: Total Cholesterol (CHOL) less than 160  Goal:  Triglycerides (TRIG) less than 150  Goal:  HDL greater than 40  Goal:  LDL (LDLCALC) less than 100   BLOOD PRESSURE American Stroke Association blood pressure target is less that 120/80 mm/Hg  Your discharge blood pressure is:  BP: 119/76  Monitor your blood pressure  Limit your salt and alcohol intake  Many individuals will require more than one medication for high blood pressure  DIABETES (A1c is a blood sugar average for last 3 months) Goal HGBA1c is under 7% (HBGA1c is blood sugar average for last 3 months)  Diabetes: prediabetes    Lab Results  Component Value Date   HGBA1C 6.2 (H) 09/19/2019     Your HGBA1c  can be lowered with medications, healthy diet, and exercise.  Check your blood sugar as directed by your physician  Call your physician if you experience unexplained or low blood sugars.  PHYSICAL ACTIVITY/REHABILITATION Goal is 30 minutes at least 4 days per week  Activity: Increase activity slowly, and No driving, Therapies: see above Return to work: N/a  Activity decreases your risk of heart attack and stroke and makes your heart stronger.  It helps control your weight and blood pressure; helps you relax and can improve your mood.  Participate in a regular exercise program.  Talk with your doctor about the best form of exercise for you (dancing, walking, swimming, cycling).  DIET/WEIGHT Goal is to maintain a healthy weight  Your discharge diet is:  Diet Order             DIET DYS 2 Room service appropriate? Yes; Fluid consistency: Nectar Thick  Diet effective now             liquids Your height is:  Height: 6' (182.9 cm) Your current weight is: Weight: 76 kg Your Body Mass Index (BMI) is:  BMI (Calculated): 22.72  Following the type of diet specifically designed for you will help prevent another stroke.  You are at goal weight.   Your goal Body Mass Index (BMI) is 19-24.  Healthy food habits can help reduce 3 risk factors for stroke:  High cholesterol, hypertension, and excess weight.  RESOURCES Stroke/Support Group:  Call [phone]   STROKE EDUCATION PROVIDED/REVIEWED AND GIVEN TO PATIENT Stroke warning signs and symptoms How to activate emergency medical system (call 911). Medications prescribed at discharge. Need for follow-up after discharge. Personal risk factors for stroke. Pneumonia vaccine given:  Flu vaccine given:  My questions have been answered, the writing is legible, and I understand these instructions.  I will adhere to these goals & educational materials that have been provided to me after my discharge from the hospital.      My questions have been answered and I understand these instructions. I will adhere to these goals and the provided educational materials after my discharge from the hospital.  Patient/Caregiver Signature _______________________________ Date __________  Clinician Signature _______________________________________ Date __________  Please bring this form and your medication list with you to all your follow-up doctor's appointments. Carbohydrate Counting For People With Diabetes  Foods with carbohydrates make your blood glucose level go up. Learning how to count carbohydrates can help you control your blood glucose levels. First, identify the foods you eat that contain carbohydrates. Then, using the Foods with Carbohydrates chart, determine about how much carbohydrates are in your meals and  snacks. Make sure you are eating foods with fiber, protein, and healthy fat along with your carbohydrate foods. Foods with Carbohydrates The following table shows carbohydrate foods that have about 15 grams of carbohydrate each. Using measuring cups, spoons, or a food scale when you first begin learning about carbohydrate counting can help you learn about the portion sizes you typically eat. The following foods have 15 grams carbohydrate each:  Grains . 1 slice bread (1 ounce)  . 1 small tortilla (6-inch size)  .  large bagel (1 ounce)  . 1/3 cup pasta or rice (cooked)  .  hamburger or hot dog bun ( ounce)  .  cup cooked cereal  .  to  cup ready-to-eat cereal  . 2 taco shells (5-inch size) Fruit . 1 small fresh fruit ( to 1 cup)  .  medium banana  .  17 small grapes (3 ounces)  . 1 cup melon or berries  .  cup canned or frozen fruit  . 2 tablespoons dried fruit (blueberries, cherries, cranberries, raisins)  .  cup unsweetened fruit juice  Starchy Vegetables .  cup cooked beans, peas, corn, potatoes/sweet potatoes  .  large baked potato (3 ounces)  . 1 cup acorn or butternut squash  Snack Foods . 3 to 6 crackers  . 8 potato chips or 13 tortilla chips ( ounce to 1 ounce)  . 3 cups popped popcorn  Dairy . 3/4 cup (6 ounces) nonfat plain yogurt, or yogurt with sugar-free sweetener  . 1 cup milk  . 1 cup plain rice, soy, coconut or flavored almond milk Sweets and Desserts .  cup ice cream or frozen yogurt  . 1 tablespoon jam, jelly, pancake syrup, table sugar, or honey  . 2 tablespoons light pancake syrup  . 1 inch square of frosted cake or 2 inch square of unfrosted cake  . 2 small cookies (2/3 ounce each) or  large cookie  Sometimes you'll have to estimate carbohydrate amounts if you don't know the exact recipe. One cup of mixed foods like soups can have 1 to 2 carbohydrate servings, while some casseroles might have 2 or more servings of carbohydrate. Foods that have  less than 20 calories in each serving can be counted as "free" foods. Count 1 cup raw vegetables, or  cup cooked non-starchy vegetables as "free" foods. If you eat 3 or more servings at one meal, then count them as 1 carbohydrate serving.  Foods without Carbohydrates  Not all foods contain carbohydrates. Meat, some dairy, fats, non-starchy vegetables, and many beverages don't contain carbohydrate. So when you count carbohydrates, you can generally exclude chicken, pork, beef, fish, seafood, eggs, tofu, cheese, butter, sour cream, avocado, nuts, seeds, olives, mayonnaise, water, black coffee, unsweetened tea, and zero-calorie drinks. Vegetables with no or low carbohydrate include green beans, cauliflower, tomatoes, and onions. How much carbohydrate should I eat at each meal?  Carbohydrate counting can help you plan your meals and manage your weight. Following are some starting points for carbohydrate intake at each meal. Work with your registered dietitian nutritionist to find the best range that works for your blood glucose and weight.   To Lose Weight To Maintain Weight  Women 2 - 3 carb servings 3 - 4 carb servings  Men 3 - 4 carb servings 4 - 5 carb servings  Checking your blood glucose after meals will help you know if you need to adjust the timing, type, or number of carbohydrate servings in your meal plan. Achieve and keep a healthy body weight by balancing your food intake and physical activity.  Tips How should I plan my meals?  Plan for half the food on your plate to include non-starchy vegetables, like salad greens, broccoli, or carrots. Try to eat 3 to 5 servings of non-starchy vegetables every day. Have a protein food at each meal. Protein foods include chicken, fish, meat, eggs, or beans (note that beans contain carbohydrate). These two food groups (non-starchy vegetables and proteins) are low in carbohydrate. If you fill up your plate with these foods, you will eat less carbohydrate but  still fill up your stomach. Try to limit your carbohydrate portion to  of the plate.  What fats are healthiest to eat?  Diabetes increases risk for heart disease. To help protect your heart, eat more healthy fats, such as olive oil, nuts, and avocado. Eat  less saturated fats like butter, cream, and high-fat meats, like bacon and sausage. Avoid trans fats, which are in all foods that list "partially hydrogenated oil" as an ingredient. What should I drink?  Choose drinks that are not sweetened with sugar. The healthiest choices are water, carbonated or seltzer waters, and tea and coffee without added sugars.  Sweet drinks will make your blood glucose go up very quickly. One serving of soda or energy drink is  cup. It is best to drink these beverages only if your blood glucose is low.  Artificially sweetened, or diet drinks, typically do not increase your blood glucose if they have zero calories in them. Read labels of beverages, as some diet drinks do have carbohydrate and will raise your blood glucose. Label Reading Tips Read Nutrition Facts labels to find out how many grams of carbohydrate are in a food you want to eat. Don't forget: sometimes serving sizes on the label aren't the same as how much food you are going to eat, so you may need to calculate how much carbohydrate is in the food you are serving yourself.   Carbohydrate Counting for People with Diabetes Sample 1-Day Menu  Breakfast  cup yogurt, low fat, low sugar (1 carbohydrate serving)   cup cereal, ready-to-eat, unsweetened (1 carbohydrate serving)  1 cup strawberries (1 carbohydrate serving)   cup almonds ( carbohydrate serving)  Lunch 1, 5 ounce can chunk light tuna  2 ounces cheese, low fat cheddar  6 whole wheat crackers (1 carbohydrate serving)  1 small apple (1 carbohydrate servings)   cup carrots ( carbohydrate serving)   cup snap peas  1 cup 1% milk (1 carbohydrate serving)   Evening Meal Stir fry made with: 3  ounces chicken  1 cup brown rice (3 carbohydrate servings)   cup broccoli ( carbohydrate serving)   cup green beans   cup onions  1 tablespoon olive oil  2 tablespoons teriyaki sauce ( carbohydrate serving)  Evening Snack 1 extra small banana (1 carbohydrate serving)  1 tablespoon peanut butter   Carbohydrate Counting for People with Diabetes Vegan Sample 1-Day Menu  Breakfast 1 cup cooked oatmeal (2 carbohydrate servings)   cup blueberries (1 carbohydrate serving)  2 tablespoons flaxseeds  1 cup soymilk fortified with calcium and vitamin D  1 cup coffee  Lunch 2 slices whole wheat bread (2 carbohydrate servings)   cup baked tofu   cup lettuce  2 slices tomato  2 slices avocado   cup baby carrots ( carbohydrate serving)  1 orange (1 carbohydrate serving)  1 cup soymilk fortified with calcium and vitamin D   Evening Meal Burrito made with: 1 6-inch corn tortilla (1 carbohydrate serving)  1 cup refried vegetarian beans (2 carbohydrate servings)   cup chopped tomatoes   cup lettuce   cup salsa  1/3 cup brown rice (1 carbohydrate serving)  1 tablespoon olive oil for rice   cup zucchini   Evening Snack 6 small whole grain crackers (1 carbohydrate serving)  2 apricots ( carbohydrate serving)   cup unsalted peanuts ( carbohydrate serving)    Carbohydrate Counting for People with Diabetes Vegetarian (Lacto-Ovo) Sample 1-Day Menu  Breakfast 1 cup cooked oatmeal (2 carbohydrate servings)   cup blueberries (1 carbohydrate serving)  2 tablespoons flaxseeds  1 egg  1 cup 1% milk (1 carbohydrate serving)  1 cup coffee  Lunch 2 slices whole wheat bread (2 carbohydrate servings)  2 ounces low-fat cheese   cup lettuce  2  slices tomato  2 slices avocado   cup baby carrots ( carbohydrate serving)  1 orange (1 carbohydrate serving)  1 cup unsweetened tea  Evening Meal Burrito made with: 1 6-inch corn tortilla (1 carbohydrate serving)   cup refried vegetarian  beans (1 carbohydrate serving)   cup tomatoes   cup lettuce   cup salsa  1/3 cup brown rice (1 carbohydrate serving)  1 tablespoon olive oil for rice   cup zucchini  1 cup 1% milk (1 carbohydrate serving)  Evening Snack 6 small whole grain crackers (1 carbohydrate serving)  2 apricots ( carbohydrate serving)   cup unsalted peanuts ( carbohydrate serving)    Copyright 2020  Academy of Nutrition and Dietetics. All rights reserved.  Using Nutrition Labels: Carbohydrate  . Serving Size  . Look at the serving size. All the information on the label is based on this portion. Randol Kern Per Container  . The number of servings contained in the package. . Guidelines for Carbohydrate  . Look at the total grams of carbohydrate in the serving size.  . 1 carbohydrate choice = 15 grams of carbohydrate. Range of Carbohydrate Grams Per Choice  Carbohydrate Grams/Choice Carbohydrate Choices  6-10   11-20 1  21-25 1  26-35 2  36-40 2  41-50 3  51-55 3  56-65 4  66-70 4  71-80 5    Copyright 2020  Academy of Nutrition and Dietetics. All rights reserved.

## 2019-10-21 NOTE — Progress Notes (Signed)
Patient and family received discharge instructions from this RN with verbal understanding. Patient being discharged to home with family and belongings.

## 2019-10-21 NOTE — Telephone Encounter (Signed)
Patient still in process of discharge

## 2019-10-21 NOTE — Progress Notes (Signed)
Paul [name] PHYSICAL MEDICINE & REHABILITATION PROGRESS NOTE  Subjective/Complaints: Anxious to get home. Nervous that he hasn't been dressed and prepped to go!  ROS: Patient denies fever, rash, sore throat, blurred vision, nausea, vomiting, diarrhea, cough, shortness of breath or chest pain, joint or back pain, headache, or mood change.   Objective:   No results found. No results for input(s): WBC, HGB, HCT, PLT in the last 72 hours. No results for input(s): NA, K, CL, CO2, GLUCOSE, BUN, CREATININE, CALCIUM in the last 72 hours.  Intake/Output Summary (Last 24 hours) at 10/21/2019 1055 Last data filed at 10/21/2019 0759 Gross per 24 hour  Intake 0 ml  Output -  Net 0 ml     Physical Exam: Vital Signs Blood pressure 128/77, pulse (!) 52, temperature 98.1 F (36.7 C), resp. rate 18, height 6' (1.829 m), weight 76 kg, SpO2 98 %. Constitutional: No distress . Vital signs reviewed. HEENT: EOMI, oral membranes moist Neck: supple Cardiovascular: RRR without murmur. No JVD    Respiratory/Chest: CTA Bilaterally without wheezes or rales. Normal effort    GI/Abdomen: BS +, non-tender, non-distended Ext: no clubbing, cyanosis, or edema Psych: pleasant and cooperative Skin: No evidence of breakdown, no evidence of rash Neurologic: 0/5 LUE, 1 to 1+/5 HE/HF , 0/5 distally LLE, impaired sensation LUE and LLE  Musculoskeletal: Full range of motion in all 4 extremities. No joint swelling CN7 and 12 ongoing  Assessment/Plan: 1. Functional deficits secondary to right thalamic hemorrhage which require 3+ hours per day of interdisciplinary therapy in a comprehensive inpatient rehab setting.  Physiatrist is providing close team supervision and 24 hour management of active medical problems listed below.  Physiatrist and rehab team continue to assess barriers to discharge/monitor patient progress toward functional and medical goals  Care Tool:  Bathing    Body parts bathed by patient: Chest,  Abdomen, Front perineal area, Right upper leg, Left upper leg, Face, Buttocks, Right lower leg, Left arm   Body parts bathed by helper: Right arm, Left lower leg     Bathing assist Assist Level: Minimal Assistance - Patient > 75%     Upper Body Dressing/Undressing Upper body dressing   What is the patient wearing?: Pull over shirt    Upper body assist Assist Level: Supervision/Verbal cueing    Lower Body Dressing/Undressing Lower body dressing      What is the patient wearing?: Pants     Lower body assist Assist for lower body dressing: Minimal Assistance - Patient > 75%     Toileting Toileting Toileting Activity did not occur (Clothing management and hygiene only): N/A (no void or bm)  Toileting assist Assist for toileting: Minimal Assistance - Patient > 75%     Transfers Chair/bed transfer  Transfers assist     Chair/bed transfer assist level: Minimal Assistance - Patient > 75%     Locomotion Ambulation   Ambulation assist   Ambulation activity did not occur: Safety/medical concerns(Increased R hip pain in standing, pt unable to tolerance standing)  Assist level: 2 helpers Assistive device: Photographer) Max distance: 50'   Walk 10 feet activity   Assist  Walk 10 feet activity did not occur: Safety/medical concerns(Increased R hip pain in standing, pt unable to tolerance standing)  Assist level: 2 helpers Assistive device: Walker-hemi   Walk 50 feet activity   Assist Walk 50 feet with 2 turns activity did not occur: Safety/medical concerns(Increased R hip pain in standing, pt unable to tolerance standing)  Assist level: 2 helpers  Assistive device: No Device    Walk 150 feet activity   Assist Walk 150 feet activity did not occur: Safety/medical concerns         Walk 10 feet on uneven surface  activity   Assist Walk 10 feet on uneven surfaces activity did not occur: Safety/medical concerns         Wheelchair     Assist Will  patient use wheelchair at discharge?: Yes Type of Wheelchair: Manual    Wheelchair assist level: Supervision/Verbal cueing Max wheelchair distance: 150 ft    Wheelchair 50 feet with 2 turns activity    Assist    Wheelchair 50 feet with 2 turns activity did not occur: Safety/medical concerns(unable without skilled intervention)   Assist Level: Supervision/Verbal cueing   Wheelchair 150 feet activity     Assist  Wheelchair 150 feet activity did not occur: Safety/medical concerns(unable without skilled intervention)   Assist Level: Supervision/Verbal cueing   Blood pressure 128/77, pulse (!) 52, temperature 98.1 F (36.7 C), resp. rate 18, height 6' (1.829 m), weight 76 kg, SpO2 98 %.  Medical Problem List and Plan: 1.Left hemiparesis and visual-spatial deficitssecondary to right thalamic hemorrhage with intraventricular extension  Continue CIR  -WHO,PRAFO for LUE and LLE  -dc home today  -outpt PT, OT after discharge  -Patient to see me in the office for transitional care encounter in 1-2 weeks.   2. Antithrombotics:  -DVT/anticoagulation:Mechanical:Sequential compression devices, below kneeBilateral lower extremities   Dopplers negative for DVT -antiplatelet therapy: N/a 3.Headaches/Pain Management:Fioricet or ultram prn---would like to move away from these     Topamax   100mg  BID     3/26 improved control no changes in meds  4. Mood:LCSW to follow for evaluation and support. -antipsychotic agents: N/A  -appreciate neuropsych input and assistance   -has been voicing feelings of despair related to his diagnosis  3/15 initiated trial of celexa 10mg  qhs   -may have some cortical contributions to lability/PBA 5. Neuropsych: This patientiscapable of making decisions on hisown behalf.   6. Skin/Wound Care:Routine pressure relief measures. 7. Fluids/Electrolytes/Nutrition:Monitor I/Os.   -hydration has been better 8. HTN:  Monitor BP tid-continue Hydralazine, Cozaar, Coreg, Catapres and Norvasc. TItrateas indicated--SBP goal<140. Nanda Quinton has been soft and bradycardic. Will decrease Amlodipine to 5mg  daily.  3/11: Will decrease Amlodipine further to 2.5mg .  3/12: will stop amlodipine.  3/13- bradycardic- decreased losartan to 25 mg BID and monitor- BP soft as well   Vitals:   10/20/19 2044 10/21/19 0454  BP: (!) 155/83 128/77  Pulse: (!) 51 (!) 52  Resp: 18 18  Temp: 97.9 F (36.6 C) 98.1 F (36.7 C)  SpO2: 100% 98%  Controlled 3/26 ongoing bradycardia 9. Prediabetes: Hgb A1C- 6.2--was 5.7 eight months ago. Will have RD educate on CM diet---      has been well controlled; now off daily CBGs and SSI 10. Tobacco/Cannabis use: Reports that he plans on quitting.  11. Dyslipidemia: Statin on hold due to ICH--resume at discharge. 12.  Post stroke dysphagia: doesn't like consistency but is tolerating due to nectars so far with supervision  -f/u MBS per SLP  3/18 persistent oropharyngeal dysphagia--no change in diet D2/nectars 13.  AKI  Creatinine 1.18 on 3/18  Encourage fluids  Off IVF for now  3/21: labs holding nicely 15/0.85  3/22 labs WNL 14. Slow transit constipation:  -augmented regimen to senna-s to 2 tabs bid. Had BM 3/23---needs one today or tomorrow  -encourage fluids 15. Urinary retention: resolved  Voiding well with urecholine  -continent/ minimal PVR's for several days now  -wean urecholine as outpt LOS: 27 days A FACE TO [place] 10/21/2019, 10:55 AM

## 2019-10-21 NOTE — Progress Notes (Signed)
Social Work Discharge Note   The overall goal for the admission was met for:   Discharge location: Yes. D/c to his son Brett's home.   Length of Stay: Yes. 27 days.   Discharge activity level: Yes. Min A; 24/7 care due to cognition  Home/community participation: Yes. Limited  Services provided included: MD, RD, PT, OT, SLP, RN, CM, TR, Pharmacy, Neuropsych and SW  Financial Services: Other: Uninsured  Follow-up services arranged: Outpatient: [facility] for PT/OT/ST and DME: w/c with L lap tray; ttb transfer bench, hospital bed  Comments (or additional information): contact pt brother Leroy Sea [phone] or son Wilfred Lacy #[phone]  New patient appointment on Thursday, April 15 at 9:30am with Dr. Dierdre Searles at [facility] [phone]).  [facility] patient Geneticist, molecular in [place]).    Patient/Family verbalized understanding of follow-up arrangements: Yes  Individual responsible for coordination of the follow-up plan: Pt to have assistance from various family members coordinating his care needs.    Confirmed correct DME delivered: Rana Snare 10/21/2019    Loralee Pacas, MSW, [place] Office: [phone] Cell: [phone] Fax: [phone]

## 2019-10-21 NOTE — Progress Notes (Signed)
Recreational Therapy Discharge Summary Patient Details  Name: Paul [name] MRN: [id number] Date of Birth: [birth year] Today's Date: 10/21/2019  Long term goals set: 2  Long term goals met: 1  Comments on progress toward goals: Pt is discharging home today with family to provide/coordinate 24 hour assistance.  TR sessions focused on activities to increase mood/participation, activity analysis identifying potential modifications, activity tolerance, dynamic sitting/standing balance, visual scanning, community reintegration w/c level.  Pt met Min assist w/c level goal for community reintegration during simulated outing within hospital and outside on hospital grounds.    Reasons goals not met: Pt did not meet Mod assist level during TR sessions for standing balance tasks, however with UE support, PT documents pt as Mod assist for dynamic balance.   Reasons for discharge: discharge from hospital Patient/family agrees with progress made and goals achieved: Yes  [name] 10/21/2019, 12:49 PM

## 2019-10-25 ENCOUNTER — Telehealth: Payer: Self-pay

## 2019-10-25 NOTE — Telephone Encounter (Signed)
Transitional Care call--[name]-brother    1. Are you/is patient experiencing any problems since coming home? No Are there any questions regarding any aspect of care? No 2. Are there any questions regarding medications administration/dosing? No Are meds being taken as prescribed? Yes Patient should review meds with caller to confirm 3. Have there been any falls? No 4. Has Home Health been to the house and/or have they contacted you? Outpatient Rehab and they have contacted If not, have you tried to contact them? Can we help you contact them?  5. Are bowels and bladder emptying properly? Yes Are there any unexpected incontinence issues? No If applicable, is patient following bowel/bladder programs? 6. Any fevers, problems with breathing, unexpected pain? No 7. Are there any skin problems or new areas of breakdown? No 8. Has the patient/family member arranged specialty MD follow up (ie cardiology/neurology/renal/surgical/etc)? Yes Can we help arrange? 9. Does the patient need any other services or support that we can help arrange? No 10. Are caregivers following through as expected in assisting the patient? Yes 11. Has the patient quit smoking, drinking alcohol, or using drugs as recommended? Yes  Appointment time 11:20 am, arrive time 10:50 am with Dr. Naaman Plummer  [address]

## 2019-10-26 ENCOUNTER — Other Ambulatory Visit: Payer: Self-pay

## 2019-10-26 ENCOUNTER — Encounter: Payer: Medicaid Other | Attending: Physical Medicine & Rehabilitation | Admitting: Physical Medicine & Rehabilitation

## 2019-10-26 ENCOUNTER — Encounter: Payer: Self-pay | Admitting: Physical Medicine & Rehabilitation

## 2019-10-26 VITALS — BP 119/73 | HR 54 | Temp 97.7°F | Ht 72.0 in

## 2019-10-26 DIAGNOSIS — I10 Essential (primary) hypertension: Secondary | ICD-10-CM

## 2019-10-26 DIAGNOSIS — I610 Nontraumatic intracerebral hemorrhage in hemisphere, subcortical: Secondary | ICD-10-CM

## 2019-10-26 DIAGNOSIS — I69391 Dysphagia following cerebral infarction: Secondary | ICD-10-CM

## 2019-10-26 NOTE — Patient Instructions (Addendum)
STOP DEPAKOTE IF YOU HAVEN'T ALREADY   BETHANOCOL:  25MG  TWICE DAILY FOR 4 DAYS, THEN IN MORNIN FOR 4 DAYS, THEN STOP .
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
## 2019-11-03 ENCOUNTER — Telehealth: Payer: Self-pay

## 2019-11-03 NOTE — Telephone Encounter (Signed)
SW received phone call from [facility] who called to inform pt was approved for charity care, and wanted to confirm diagnosis code. SW informed on correct diagnosis code I61.0 should be entered. Also reports she called clinic, and when receiving return call was informed pt was not in their system. SW confirmed pt was last seen on 3/31 by Dr. Naaman Plummer. States she was informed pt would not be seen by PA but attending. SW encouraged for follow-up orders to be sent to this attending versus Reesa Chew, PA who initially signed the orders. No further SW intervention required.

## 2019-11-04 ENCOUNTER — Other Ambulatory Visit: Payer: Self-pay | Admitting: Physical Medicine and Rehabilitation

## 2019-11-04 NOTE — Telephone Encounter (Signed)
Dr. Naaman Plummer patient.  Last clinic note:   2. Headaches/Pain Management: Topamax 100mg  BID ---continue  -wean off fioricet and tramadol as possible

## 2019-11-04 NOTE — Telephone Encounter (Signed)
Tramadol refilled. thx

## 2019-11-10 ENCOUNTER — Ambulatory Visit: Payer: Self-pay | Admitting: Family Medicine

## 2019-11-16 ENCOUNTER — Other Ambulatory Visit: Payer: Self-pay

## 2019-11-16 ENCOUNTER — Ambulatory Visit: Payer: Self-pay | Attending: Family Medicine | Admitting: Family Medicine

## 2019-11-16 DIAGNOSIS — G4709 Other insomnia: Secondary | ICD-10-CM

## 2019-11-16 DIAGNOSIS — I10 Essential (primary) hypertension: Secondary | ICD-10-CM

## 2019-11-16 DIAGNOSIS — I610 Nontraumatic intracerebral hemorrhage in hemisphere, subcortical: Secondary | ICD-10-CM

## 2019-11-16 DIAGNOSIS — I69354 Hemiplegia and hemiparesis following cerebral infarction affecting left non-dominant side: Secondary | ICD-10-CM

## 2019-11-16 MED ORDER — HYDRALAZINE HCL 100 MG PO TABS: 100.0000 mg | ORAL_TABLET | Freq: Three times a day (TID) | ORAL | 3 refills | Status: DC

## 2019-11-16 MED ORDER — CARVEDILOL 6.25 MG PO TABS: 6.2500 mg | ORAL_TABLET | Freq: Two times a day (BID) | ORAL | 3 refills | Status: DC

## 2019-11-16 MED ORDER — LOSARTAN POTASSIUM 25 MG PO TABS: 25.0000 mg | ORAL_TABLET | Freq: Two times a day (BID) | ORAL | 3 refills | Status: DC

## 2019-11-16 MED ORDER — TOPIRAMATE 100 MG PO TABS: 100.0000 mg | ORAL_TABLET | Freq: Two times a day (BID) | ORAL | 3 refills | Status: DC

## 2019-11-16 MED ORDER — CLONIDINE HCL 0.1 MG PO TABS: 0.1000 mg | ORAL_TABLET | Freq: Three times a day (TID) | ORAL | 3 refills | Status: DC

## 2019-11-16 MED ORDER — BUTALBITAL-APAP-CAFFEINE 50-325-40 MG PO TABS: 1.0000 | ORAL_TABLET | Freq: Three times a day (TID) | ORAL | 0 refills | Status: DC | PRN

## 2019-11-16 MED ORDER — TRAZODONE HCL 50 MG PO TABS: 25.0000 mg | ORAL_TABLET | Freq: Every evening | ORAL | 1 refills | Status: DC | PRN

## 2019-11-16 MED FILL — traZODone HCL 50 MG TABS: 50 | 30 days supply | Qty: 30 | Fill #0

## 2019-11-16 MED FILL — TOPIRAMATE 100 MG TABS: 100 | 30 days supply | Qty: 60 | Fill #0

## 2019-11-16 MED FILL — BUTALB-ACETAMIN-CAFF 50-325: 50-325-40 | 4 days supply | Qty: 14 | Fill #0

## 2019-11-16 MED FILL — LOSARTAN POTASSIUM 25 MG TA: 25 | 15 days supply | Qty: 30 | Fill #0

## 2019-11-16 MED FILL — cloNIDine HCL 0.1 MG TABS: 0.1 | 30 days supply | Qty: 90 | Fill #0

## 2019-11-16 MED FILL — CARVEDILOL 6.25 MG TABLET: 6.25 | 30 days supply | Qty: 60 | Fill #0

## 2019-11-16 MED FILL — hydrALAZINE HCL 100 MG TABS: 100 | 30 days supply | Qty: 90 | Fill #0
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
## 2019-11-16 NOTE — Progress Notes (Signed)
Needs refills on all medications.

## 2019-11-17 ENCOUNTER — Encounter: Payer: Self-pay | Admitting: Family Medicine

## 2019-12-05 ENCOUNTER — Other Ambulatory Visit: Payer: Self-pay | Admitting: Physical Medicine and Rehabilitation

## 2019-12-05 DIAGNOSIS — I10 Essential (primary) hypertension: Secondary | ICD-10-CM

## 2019-12-06 ENCOUNTER — Telehealth: Payer: Self-pay

## 2019-12-06 NOTE — Telephone Encounter (Signed)
Patient requested [facility] transfer losartan (COZAAR) 25 MG medication to the [facility] [place], [place], [place] [address]. Phone: [phone]

## 2019-12-07 NOTE — Telephone Encounter (Signed)
Can you please assist with this? Thanks

## 2019-12-07 NOTE — Telephone Encounter (Signed)
Contacted patient to let him know to have that [facility] call us to request a transfer.

## 2019-12-08 ENCOUNTER — Other Ambulatory Visit: Payer: Self-pay | Admitting: Physical Medicine and Rehabilitation

## 2019-12-08 DIAGNOSIS — I10 Essential (primary) hypertension: Secondary | ICD-10-CM

## 2019-12-12 ENCOUNTER — Inpatient Hospital Stay: Payer: Self-pay | Admitting: Neurology

## 2019-12-15 ENCOUNTER — Other Ambulatory Visit: Payer: Self-pay

## 2019-12-15 ENCOUNTER — Ambulatory Visit: Payer: Self-pay | Admitting: Neurology

## 2019-12-15 ENCOUNTER — Encounter: Payer: Self-pay | Admitting: Neurology

## 2019-12-15 ENCOUNTER — Telehealth: Payer: Self-pay

## 2019-12-15 VITALS — BP 101/70 | HR 64 | Ht 72.0 in

## 2019-12-15 DIAGNOSIS — I610 Nontraumatic intracerebral hemorrhage in hemisphere, subcortical: Secondary | ICD-10-CM

## 2019-12-15 DIAGNOSIS — Z8673 Personal history of transient ischemic attack (TIA), and cerebral infarction without residual deficits: Secondary | ICD-10-CM

## 2019-12-15 DIAGNOSIS — G8110 Spastic hemiplegia affecting unspecified side: Secondary | ICD-10-CM

## 2019-12-15 MED ORDER — ASPIRIN EC 81 MG PO TBEC: 81.0000 mg | DELAYED_RELEASE_TABLET | Freq: Every day | ORAL | 2 refills | Status: AC

## 2019-12-15 NOTE — Telephone Encounter (Signed)
Pt called states Pain left hip. Also when doing therapy twice weekly it causes left shoulder arm leg pain. Pt request increase pain med.  [facility] in [place].  Pt ph [phone].  Pt has fu appt PCP 01/24/20.

## 2019-12-15 NOTE — Telephone Encounter (Signed)
Patient was called and informed to contact his rehab doctor for medication.

## 2019-12-15 NOTE — Patient Instructions (Signed)
I had a long discussion with the patient and his brother regarding his recent intracerebral hemorrhage and residual spastic left hemiplegia and answered questions.  Recommend strict control of hypertension with blood pressure goal below 130/90.  Patient was counseled to quit marijuana.  He was also advised to start taking aspirin 81 mg daily for stroke prevention given prior history of ischemic strokes.  Taper Topamax to 50 mg twice daily for a week then 50 mg once a day for a week and discontinue as his headaches seem to have resolved.  Maintain strict control of lipids with LDL cholesterol goal below 70 mg percent and diabetes with hemoglobin A1c goal below 6.5%.  He was encouraged to continue outpatient physical and occupational therapy.  He was advised to follow-up with Dr. Tessa Lerner for hip pain as well as post stroke spasticity management.  He will return for follow-up in the future in 3 months with Janett Billow my nurse practitioner call earlier if necessary.  Stroke Prevention Some medical conditions and behaviors are associated with a higher chance of having a stroke. You can help prevent a stroke by making nutrition, lifestyle, and other changes, including managing any medical conditions you may have. What nutrition changes can be made?   Eat healthy foods. You can do this by: ? Choosing foods high in fiber, such as fresh fruits and vegetables and whole grains. ? Eating at least 5 or more servings of fruits and vegetables a day. Try to fill half of your plate at each meal with fruits and vegetables. ? Choosing lean protein foods, such as lean cuts of meat, poultry without skin, fish, tofu, beans, and nuts. ? Eating low-fat dairy products. ? Avoiding foods that are high in salt (sodium). This can help lower blood pressure. ? Avoiding foods that have saturated fat, trans fat, and cholesterol. This can help prevent high cholesterol. ? Avoiding processed and premade foods.  Follow your health care  provider's specific guidelines for losing weight, controlling high blood pressure (hypertension), lowering high cholesterol, and managing diabetes. These may include: ? Reducing your daily calorie intake. ? Limiting your daily sodium intake to 1,500 milligrams (mg). ? Using only healthy fats for cooking, such as olive oil, canola oil, or sunflower oil. ? Counting your daily carbohydrate intake. What lifestyle changes can be made?  Maintain a healthy weight. Talk to your health care provider about your ideal weight.  Get at least 30 minutes of moderate physical activity at least 5 days a week. Moderate activity includes brisk walking, biking, and swimming.  Do not use any products that contain nicotine or tobacco, such as cigarettes and e-cigarettes. If you need help quitting, ask your health care provider. It may also be helpful to avoid exposure to secondhand smoke.  Limit alcohol intake to no more than 1 drink a day for nonpregnant women and 2 drinks a day for men. One drink equals 12 oz of beer, 5 oz of wine, or 1 oz of hard liquor.  Stop any illegal drug use.  Avoid taking birth control pills. Talk to your health care provider about the risks of taking birth control pills if: ? You are over 38 years old. ? You smoke. ? You get migraines. ? You have ever had a blood clot. What other changes can be made?  Manage your cholesterol levels. ? Eating a healthy diet is important for preventing high cholesterol. If cholesterol cannot be managed through diet alone, you may also need to take medicines. ? Take any  prescribed medicines to control your cholesterol as told by your health care provider.  Manage your diabetes. ? Eating a healthy diet and exercising regularly are important parts of managing your blood sugar. If your blood sugar cannot be managed through diet and exercise, you may need to take medicines. ? Take any prescribed medicines to control your diabetes as told by your health  care provider.  Control your hypertension. ? To reduce your risk of stroke, try to keep your blood pressure below 130/80. ? Eating a healthy diet and exercising regularly are an important part of controlling your blood pressure. If your blood pressure cannot be managed through diet and exercise, you may need to take medicines. ? Take any prescribed medicines to control hypertension as told by your health care provider. ? Ask your health care provider if you should monitor your blood pressure at home. ? Have your blood pressure checked every year, even if your blood pressure is normal. Blood pressure increases with age and some medical conditions.  Get evaluated for sleep disorders (sleep apnea). Talk to your health care provider about getting a sleep evaluation if you snore a lot or have excessive sleepiness.  Take over-the-counter and prescription medicines only as told by your health care provider. Aspirin or blood thinners (antiplatelets or anticoagulants) may be recommended to reduce your risk of forming blood clots that can lead to stroke.  Make sure that any other medical conditions you have, such as atrial fibrillation or atherosclerosis, are managed. What are the warning signs of a stroke? The warning signs of a stroke can be easily remembered as BEFAST.  B is for balance. Signs include: ? Dizziness. ? Loss of balance or coordination. ? Sudden trouble walking.  E is for eyes. Signs include: ? A sudden change in vision. ? Trouble seeing.  F is for face. Signs include: ? Sudden weakness or numbness of the face. ? The face or eyelid drooping to one side.  A is for arms. Signs include: ? Sudden weakness or numbness of the arm, usually on one side of the body.  S is for speech. Signs include: ? Trouble speaking (aphasia). ? Trouble understanding.  T is for time. ? These symptoms may represent a serious problem that is an emergency. Do not wait to see if the symptoms will go  away. Get medical help right away. Call your local emergency services (911 in the [place] Do not drive yourself to the hospital.  Other signs of stroke may include: ? A sudden, severe headache with no known cause. ? Nausea or vomiting. ? Seizure. Where to find more information For more information, visit:  American Stroke Association: www.strokeassociation.org  National Stroke Association: www.stroke.org Summary  You can prevent a stroke by eating healthy, exercising, not smoking, limiting alcohol intake, and managing any medical conditions you may have.  Do not use any products that contain nicotine or tobacco, such as cigarettes and e-cigarettes. If you need help quitting, ask your health care provider. It may also be helpful to avoid exposure to secondhand smoke.  Remember BEFAST for warning signs of stroke. Get help right away if you or a loved one has any of these signs. This information is not intended to replace advice given to you by your health care provider. Make sure you discuss any questions you have with your health care provider. Document Revised: 06/26/2017 Document Reviewed: 08/19/2016 Elsevier Patient Education  2020 [employer].
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
## 2019-12-15 NOTE — Telephone Encounter (Signed)
Pain medication comes from his Rehab Physician -Dr Tessa Lerner. He needs to contact Rehab Medicine.

## 2019-12-28 ENCOUNTER — Encounter: Payer: Medicaid Other | Attending: Physical Medicine & Rehabilitation | Admitting: Physical Medicine & Rehabilitation

## 2019-12-28 ENCOUNTER — Encounter: Payer: Self-pay | Admitting: Physical Medicine & Rehabilitation

## 2019-12-28 ENCOUNTER — Other Ambulatory Visit: Payer: Self-pay

## 2019-12-28 VITALS — BP 109/64 | HR 74 | Temp 97.5°F

## 2019-12-28 DIAGNOSIS — I610 Nontraumatic intracerebral hemorrhage in hemisphere, subcortical: Secondary | ICD-10-CM

## 2019-12-28 DIAGNOSIS — M792 Neuralgia and neuritis, unspecified: Secondary | ICD-10-CM | POA: Insufficient documentation

## 2019-12-28 DIAGNOSIS — I69391 Dysphagia following cerebral infarction: Secondary | ICD-10-CM | POA: Diagnosis not present

## 2019-12-28 DIAGNOSIS — F329 Major depressive disorder, single episode, unspecified: Secondary | ICD-10-CM | POA: Insufficient documentation

## 2019-12-28 DIAGNOSIS — I10 Essential (primary) hypertension: Secondary | ICD-10-CM | POA: Diagnosis present

## 2019-12-28 DIAGNOSIS — Z5181 Encounter for therapeutic drug level monitoring: Secondary | ICD-10-CM

## 2019-12-28 DIAGNOSIS — G894 Chronic pain syndrome: Secondary | ICD-10-CM

## 2019-12-28 DIAGNOSIS — Z79891 Long term (current) use of opiate analgesic: Secondary | ICD-10-CM | POA: Diagnosis not present

## 2019-12-28 MED ORDER — GABAPENTIN 100 MG PO CAPS: 100.0000 mg | ORAL_CAPSULE | Freq: Three times a day (TID) | ORAL | 3 refills | Status: DC

## 2019-12-28 MED ORDER — CITALOPRAM HYDROBROMIDE 10 MG PO TABS: 10.0000 mg | ORAL_TABLET | Freq: Every day | ORAL | 2 refills | Status: DC

## 2019-12-28 MED ORDER — TIZANIDINE HCL 2 MG PO TABS: 2.0000 mg | ORAL_TABLET | Freq: Two times a day (BID) | ORAL | 1 refills | Status: DC

## 2019-12-28 NOTE — Patient Instructions (Addendum)
BE AGGRESSIVE RANGING YOUR LEFT SHOULDER  CALL ME IF YOU WANT TO INCREASE THE MUSCLE RELAXANT TIZANIDINE
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
## 2020-01-24 ENCOUNTER — Telehealth: Payer: Self-pay | Admitting: Family Medicine

## 2020-02-15 DIAGNOSIS — Z0271 Encounter for disability determination: Secondary | ICD-10-CM

## 2020-02-16 ENCOUNTER — Other Ambulatory Visit: Payer: Self-pay | Admitting: Family Medicine

## 2020-02-16 DIAGNOSIS — I610 Nontraumatic intracerebral hemorrhage in hemisphere, subcortical: Secondary | ICD-10-CM

## 2020-02-16 DIAGNOSIS — I10 Essential (primary) hypertension: Secondary | ICD-10-CM

## 2020-02-16 MED ORDER — LOSARTAN POTASSIUM 25 MG PO TABS: 25.0000 mg | ORAL_TABLET | Freq: Two times a day (BID) | ORAL | 3 refills | Status: DC

## 2020-02-16 NOTE — Telephone Encounter (Signed)
Medication Refill - Medication: losartan (COZAAR) 25 MG tablet  tiZANidine (ZANAFLEX) 2 MG tablet    Pt is completely out of his supply   Has the patient contacted their pharmacy? Yes.   (Agent: If no, request that the patient contact the pharmacy for the refill.) (Agent: If yes, when and what did the pharmacy advise?)  Preferred Pharmacy (with phone number or street name):   [facility] [place], [place] [address]  [place] [address]  Phone: [phone] Fax: [phone]     Agent: Please be advised that RX refills may take up to 3 business days. We ask that you follow-up with your pharmacy.

## 2020-02-16 NOTE — Telephone Encounter (Signed)
Requested medication (s) are due for refill today: no  Requested medication (s) are on the active medication list: yes  Last refill:  12/28/2019  Future visit scheduled: no  Notes to clinic: last filled by a different provider  Review for refill   Requested Prescriptions  Pending Prescriptions Disp Refills   tiZANidine (ZANAFLEX) 2 MG tablet 60 tablet 1    Sig: Take 1 tablet (2 mg total) by mouth 2 (two) times daily.      Not Delegated - Cardiovascular:  Alpha-2 Agonists - tizanidine Failed - 02/16/2020 12:01 PM      Failed - This refill cannot be delegated      Passed - Valid encounter within last 6 months    Recent Outpatient Visits           3 months ago Nontraumatic subcortical hemorrhage of right cerebral hemisphere [facility])   [place], Charlane Ferretti, MD               Signed Prescriptions Disp Refills   losartan (COZAAR) 25 MG tablet 30 tablet 3    Sig: Take 1 tablet (25 mg total) by mouth 2 (two) times daily.      Cardiovascular:  Angiotensin Receptor Blockers Passed - 02/16/2020 12:01 PM      Passed - Cr in normal range and within 180 days    Creatinine, Ser  Date Value Ref Range Status  10/17/2019 0.85 0.61 - 1.24 mg/dL Final          Passed - K in normal range and within 180 days    Potassium  Date Value Ref Range Status  10/17/2019 4.1 3.5 - 5.1 mmol/L Final          Passed - Patient is not pregnant      Passed - Last BP in normal range    BP Readings from Last 1 Encounters:  12/28/19 109/64          Passed - Valid encounter within last 6 months    Recent Outpatient Visits           3 months ago Nontraumatic subcortical hemorrhage of right cerebral hemisphere [facility])    [facility] Charlott Rakes, MD

## 2020-02-17 ENCOUNTER — Other Ambulatory Visit: Payer: Self-pay | Admitting: Physical Medicine & Rehabilitation

## 2020-02-17 DIAGNOSIS — I610 Nontraumatic intracerebral hemorrhage in hemisphere, subcortical: Secondary | ICD-10-CM

## 2020-02-29 ENCOUNTER — Encounter: Payer: Self-pay | Admitting: Physical Medicine & Rehabilitation

## 2020-02-29 ENCOUNTER — Encounter: Payer: Medicaid Other | Attending: Physical Medicine & Rehabilitation | Admitting: Physical Medicine & Rehabilitation

## 2020-02-29 ENCOUNTER — Other Ambulatory Visit: Payer: Self-pay

## 2020-02-29 VITALS — BP 144/89 | HR 85 | Temp 98.1°F

## 2020-02-29 DIAGNOSIS — G8114 Spastic hemiplegia affecting left nondominant side: Secondary | ICD-10-CM

## 2020-02-29 DIAGNOSIS — F329 Major depressive disorder, single episode, unspecified: Secondary | ICD-10-CM | POA: Diagnosis present

## 2020-02-29 DIAGNOSIS — I69391 Dysphagia following cerebral infarction: Secondary | ICD-10-CM | POA: Diagnosis not present

## 2020-02-29 DIAGNOSIS — I610 Nontraumatic intracerebral hemorrhage in hemisphere, subcortical: Secondary | ICD-10-CM | POA: Diagnosis not present

## 2020-02-29 DIAGNOSIS — M792 Neuralgia and neuritis, unspecified: Secondary | ICD-10-CM | POA: Insufficient documentation

## 2020-02-29 DIAGNOSIS — I10 Essential (primary) hypertension: Secondary | ICD-10-CM | POA: Insufficient documentation

## 2020-02-29 MED ORDER — TIZANIDINE HCL 2 MG PO TABS: 2.0000 mg | ORAL_TABLET | Freq: Three times a day (TID) | ORAL | 2 refills | Status: DC

## 2020-02-29 NOTE — Patient Instructions (Signed)
PLEASE FEEL FREE TO CALL OUR OFFICE WITH ANY PROBLEMS OR QUESTIONS ([phone])      
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
## 2020-03-28 ENCOUNTER — Other Ambulatory Visit: Payer: Self-pay

## 2020-03-28 ENCOUNTER — Ambulatory Visit: Payer: Medicaid Other | Admitting: Adult Health

## 2020-03-28 ENCOUNTER — Encounter: Payer: Self-pay | Admitting: Adult Health

## 2020-03-28 VITALS — BP 120/74 | HR 51

## 2020-03-28 DIAGNOSIS — I10 Essential (primary) hypertension: Secondary | ICD-10-CM | POA: Diagnosis not present

## 2020-03-28 DIAGNOSIS — I610 Nontraumatic intracerebral hemorrhage in hemisphere, subcortical: Secondary | ICD-10-CM | POA: Diagnosis not present

## 2020-03-28 DIAGNOSIS — G8110 Spastic hemiplegia affecting unspecified side: Secondary | ICD-10-CM

## 2020-03-28 DIAGNOSIS — E785 Hyperlipidemia, unspecified: Secondary | ICD-10-CM | POA: Diagnosis not present

## 2020-03-28 MED ORDER — ROSUVASTATIN CALCIUM 20 MG PO TABS
20.0000 mg | ORAL_TABLET | Freq: Every day | ORAL | 3 refills | Status: DC
Start: 2020-03-28 — End: 2021-05-21

## 2020-03-28 NOTE — Patient Instructions (Signed)
Continue working with outpatient therapy for hopeful ongoing improvement  Ongoing follow-up with Dr. Naaman Plummer for management of pain and spasticity  Continue aspirin 81 mg daily  and start Crestor 20mg  daily  for secondary stroke prevention  Continue to follow up with PCP regarding cholesterol and blood pressure management  Maintain strict control of hypertension with blood pressure goal below 130/90 and cholesterol with LDL cholesterol (bad cholesterol) goal below 70 mg/dL.       Followup in the future with me in 6 months or call earlier if needed       Thank you for coming to see [place] at [facility]. I hope we have been able to provide you high quality care today.  You may receive a patient satisfaction survey over the next few weeks. We would appreciate your feedback and comments so that we may continue to improve ourselves and the health of our patients.
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
## 2020-03-28 NOTE — Progress Notes (Signed)
I agree with the above plan 

## 2020-04-03 ENCOUNTER — Other Ambulatory Visit: Payer: Self-pay | Admitting: Family Medicine

## 2020-04-03 ENCOUNTER — Other Ambulatory Visit: Payer: Self-pay | Admitting: Physical Medicine & Rehabilitation

## 2020-04-03 DIAGNOSIS — I10 Essential (primary) hypertension: Secondary | ICD-10-CM

## 2020-04-03 DIAGNOSIS — F329 Major depressive disorder, single episode, unspecified: Secondary | ICD-10-CM

## 2020-04-03 NOTE — Telephone Encounter (Signed)
Requested Prescriptions  Pending Prescriptions Disp Refills  . carvedilol (COREG) 6.25 MG tablet [Pharmacy Med Name: CARVEDILOL 6.25MG  TABLETS] 180 tablet 0    Sig: TAKE 1 TABLET BY MOUTH TWICE DAILY WITH MEALS     Cardiovascular:  Beta Blockers Passed - 04/03/2020  2:33 PM      Passed - Last BP in normal range    BP Readings from Last 1 Encounters:  03/28/20 120/74         Passed - Last Heart Rate in normal range    Pulse Readings from Last 1 Encounters:  03/28/20 (!) 51         Passed - Valid encounter within last 6 months    Recent Outpatient Visits          4 months ago Nontraumatic subcortical hemorrhage of right cerebral hemisphere [facility])   Kosse, [place], MD             . hydrALAZINE (APRESOLINE) 100 MG tablet [Pharmacy Med Name: HYDRALAZINE 100MG  (HUNDRED MG) TABS] 270 tablet 0    Sig: TAKE 1 TABLET BY MOUTH EVERY 8 HOURS     Cardiovascular:  Vasodilators Failed - 04/03/2020  2:33 PM      Failed - HCT in normal range and within 360 days    HCT  Date Value Ref Range Status  10/17/2019 35.3 (L) 39 - 52 % Final         Failed - HGB in normal range and within 360 days    Hemoglobin  Date Value Ref Range Status  10/17/2019 11.5 (L) 13.0 - 17.0 g/dL Final         Failed - RBC in normal range and within 360 days    RBC  Date Value Ref Range Status  10/17/2019 3.79 (L) 4.22 - 5.81 MIL/uL Final         Failed - PLT in normal range and within 360 days    Platelets  Date Value Ref Range Status  10/17/2019 414 (H) 150 - 400 K/uL Final         Passed - WBC in normal range and within 360 days    WBC  Date Value Ref Range Status  10/17/2019 6.3 4.0 - 10.5 K/uL Final         Passed - Last BP in normal range    BP Readings from Last 1 Encounters:  03/28/20 120/74         Passed - Valid encounter within last 12 months    Recent Outpatient Visits          4 months ago Nontraumatic subcortical hemorrhage of right  cerebral hemisphere [facility])   Peck, [place], [place]             . cloNIDine (CATAPRES) 0.1 MG tablet [Pharmacy Med Name: CLONIDINE 0.1MG  TABLETS] 270 tablet 2    Sig: TAKE 1 TABLET BY MOUTH THREE TIMES DAILY     Cardiovascular:  Alpha-2 Agonists Passed - 04/03/2020  2:33 PM      Passed - Last BP in normal range    BP Readings from Last 1 Encounters:  03/28/20 120/74         Passed - Last Heart Rate in normal range    Pulse Readings from Last 1 Encounters:  03/28/20 (!) 51         Passed - Valid encounter within last 6 months    Recent Outpatient Visits  4 months ago Nontraumatic subcortical hemorrhage of right cerebral hemisphere [facility])   [facility] Charlott Rakes, MD

## 2020-04-04 ENCOUNTER — Encounter: Payer: Self-pay | Admitting: Physical Medicine & Rehabilitation

## 2020-04-04 ENCOUNTER — Encounter: Payer: Medicaid Other | Attending: Physical Medicine & Rehabilitation | Admitting: Physical Medicine & Rehabilitation

## 2020-04-04 ENCOUNTER — Other Ambulatory Visit: Payer: Self-pay

## 2020-04-04 VITALS — BP 164/93 | HR 59 | Temp 98.4°F

## 2020-04-04 DIAGNOSIS — I10 Essential (primary) hypertension: Secondary | ICD-10-CM | POA: Insufficient documentation

## 2020-04-04 DIAGNOSIS — F329 Major depressive disorder, single episode, unspecified: Secondary | ICD-10-CM | POA: Insufficient documentation

## 2020-04-04 DIAGNOSIS — I69391 Dysphagia following cerebral infarction: Secondary | ICD-10-CM | POA: Insufficient documentation

## 2020-04-04 DIAGNOSIS — G8114 Spastic hemiplegia affecting left nondominant side: Secondary | ICD-10-CM | POA: Diagnosis not present

## 2020-04-04 DIAGNOSIS — M792 Neuralgia and neuritis, unspecified: Secondary | ICD-10-CM | POA: Insufficient documentation

## 2020-04-04 DIAGNOSIS — I610 Nontraumatic intracerebral hemorrhage in hemisphere, subcortical: Secondary | ICD-10-CM | POA: Diagnosis not present

## 2020-04-04 NOTE — Progress Notes (Signed)
Botox Injection for spasticity of upper extremity using needle EMG guidance Indication: Spastic hemiparesis of left nondominant side (HCC)  Nontraumatic subcortical hemorrhage of right cerebral hemisphere ([place]) - Plan: Ambulatory referral to Speech Therapy G81.14    Dilution: 100 Units/ml        Total Units Injected: 400 Indication: Severe spasticity which interferes with ADL,mobility and/or  hygiene and is unresponsive to medication management and other conservative care Informed consent was obtained after describing risks and benefits of the procedure with the patient. This includes bleeding, bruising, infection, excessive weakness, or medication side effects. A REMS form is on file and signed.  Needle: 17mm injectable monopolar needle electrode    Number of units per muscle Pectoralis Major 50 units Pectoralis Minor 50 units Biceps, brachialis 150 units Brachioradialis 0 units FCR 25 units FCU 25 units FDS 100 units FDP 100 units FPL 0 units Palmaris Longus 0 units Pronator Teres 0 units Pronator Quadratus 0 units Lumbricals 0 units All injections were done after obtaining appropriate EMG activity and after negative drawback for blood. The patient tolerated the procedure well. Post procedure instructions were given. Return in about 2 months (around 06/04/2020).  Referral made to [facility] for left AFO and for outpt SLP at [facility]

## 2020-04-04 NOTE — Patient Instructions (Signed)
PLEASE FEEL FREE TO CALL OUR OFFICE WITH ANY PROBLEMS OR QUESTIONS ([phone])      

## 2020-04-25 ENCOUNTER — Other Ambulatory Visit: Payer: Self-pay | Admitting: Family Medicine

## 2020-04-25 DIAGNOSIS — I10 Essential (primary) hypertension: Secondary | ICD-10-CM

## 2020-05-03 ENCOUNTER — Other Ambulatory Visit: Payer: Self-pay | Admitting: Physical Medicine & Rehabilitation

## 2020-05-03 DIAGNOSIS — M792 Neuralgia and neuritis, unspecified: Secondary | ICD-10-CM

## 2020-06-06 ENCOUNTER — Encounter: Payer: Self-pay | Admitting: Physical Medicine & Rehabilitation

## 2020-06-06 ENCOUNTER — Other Ambulatory Visit: Payer: Self-pay

## 2020-06-06 ENCOUNTER — Other Ambulatory Visit: Payer: Self-pay | Admitting: Family Medicine

## 2020-06-06 ENCOUNTER — Encounter: Payer: Medicaid Other | Attending: Physical Medicine & Rehabilitation | Admitting: Physical Medicine & Rehabilitation

## 2020-06-06 VITALS — BP 102/59 | HR 53 | Temp 97.8°F | Ht 72.0 in | Wt 167.9 lb

## 2020-06-06 DIAGNOSIS — G8114 Spastic hemiplegia affecting left nondominant side: Secondary | ICD-10-CM | POA: Diagnosis not present

## 2020-06-06 DIAGNOSIS — I610 Nontraumatic intracerebral hemorrhage in hemisphere, subcortical: Secondary | ICD-10-CM | POA: Diagnosis present

## 2020-06-06 DIAGNOSIS — I10 Essential (primary) hypertension: Secondary | ICD-10-CM | POA: Insufficient documentation

## 2020-06-06 MED ORDER — TIZANIDINE HCL 4 MG PO TABS
4.0000 mg | ORAL_TABLET | Freq: Three times a day (TID) | ORAL | 2 refills | Status: DC
Start: 2020-06-06 — End: 2020-08-29

## 2020-06-06 NOTE — Patient Instructions (Signed)
PLEASE FEEL FREE TO CALL OUR OFFICE WITH ANY PROBLEMS OR QUESTIONS ([phone])      

## 2020-06-06 NOTE — Telephone Encounter (Signed)
Call to patient's brother- left message with him to call for appointment. Courtesy RF given #[phone].
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
## 2020-06-28 ENCOUNTER — Telehealth: Payer: Self-pay | Admitting: Family Medicine

## 2020-06-28 DIAGNOSIS — G8114 Spastic hemiplegia affecting left nondominant side: Secondary | ICD-10-CM

## 2020-06-28 DIAGNOSIS — I610 Nontraumatic intracerebral hemorrhage in hemisphere, subcortical: Secondary | ICD-10-CM

## 2020-06-28 NOTE — Telephone Encounter (Signed)
Pt has not been seen since 10/2019.

## 2020-06-28 NOTE — Telephone Encounter (Signed)
Copied from [place] [phone]. Topic: General - Other >> Jun 26, 2020 11:51 AM Yvette Rack wrote: Reason for CRM: Pt stated his therapist suggested that he contact pcp for a Rx for a hemi walker. Pt requests call back. Cb# [phone]  Please advise.

## 2020-06-29 NOTE — Telephone Encounter (Signed)
Pt states that he will contact Dr.Swartz office to get walker.

## 2020-06-29 NOTE — Telephone Encounter (Signed)
He may need an office visit within 90 days of my placing an order per Insurance guidelines.  Please schedule an appointment for him. Thanks.

## 2020-07-02 NOTE — Telephone Encounter (Signed)
Per Mr. Edsall his physical therapist thinks he should have a Hemi Walker. If you think its a good option please create a letter of medical necessity. So it came be faxed to [place].  Thank you,  Jorja Loa

## 2020-07-03 NOTE — Telephone Encounter (Signed)
Order for hemi walker entered

## 2020-07-04 ENCOUNTER — Encounter (HOSPITAL_COMMUNITY): Payer: Self-pay | Admitting: Physical Medicine & Rehabilitation

## 2020-07-04 NOTE — Telephone Encounter (Signed)
Rx and required documents faxed to [place], ph [phone].  Patient informed.

## 2020-07-06 ENCOUNTER — Other Ambulatory Visit: Payer: Self-pay | Admitting: Family Medicine

## 2020-07-06 ENCOUNTER — Other Ambulatory Visit: Payer: Self-pay | Admitting: Physical Medicine & Rehabilitation

## 2020-07-06 DIAGNOSIS — F329 Major depressive disorder, single episode, unspecified: Secondary | ICD-10-CM

## 2020-07-06 DIAGNOSIS — I10 Essential (primary) hypertension: Secondary | ICD-10-CM

## 2020-07-06 NOTE — Telephone Encounter (Signed)
Requested medication (s) are due for refill today: yes  Requested medication (s) are on the active medication list: yes  Last refill:  04/03/20  #270  0 refills  Future visit scheduled: yes  Notes to clinic:  *labs are out of date, out of acceptable range. Please review    Requested Prescriptions  Pending Prescriptions Disp Refills   hydrALAZINE (APRESOLINE) 100 MG tablet [Pharmacy Med Name: HYDRALAZINE 100MG  (HUNDRED MG) TABS] 270 tablet 0    Sig: TAKE 1 TABLET BY MOUTH EVERY 8 HOURS      Cardiovascular:  Vasodilators Failed - 07/06/2020  2:06 PM      Failed - HCT in normal range and within 360 days    HCT  Date Value Ref Range Status  10/17/2019 35.3 (L) 39.0 - 52.0 % Final          Failed - HGB in normal range and within 360 days    Hemoglobin  Date Value Ref Range Status  10/17/2019 11.5 (L) 13.0 - 17.0 g/dL Final          Failed - RBC in normal range and within 360 days    RBC  Date Value Ref Range Status  10/17/2019 3.79 (L) 4.22 - 5.81 MIL/uL Final          Failed - PLT in normal range and within 360 days    Platelets  Date Value Ref Range Status  10/17/2019 414 (H) 150 - 400 K/uL Final          Passed - WBC in normal range and within 360 days    WBC  Date Value Ref Range Status  10/17/2019 6.3 4.0 - 10.5 K/uL Final          Passed - Last BP in normal range    BP Readings from Last 1 Encounters:  06/06/20 (!) 102/59          Passed - Valid encounter within last 12 months    Recent Outpatient Visits           7 months ago Nontraumatic subcortical hemorrhage of right cerebral hemisphere [facility])   [place], Charlane Ferretti, MD       Future Appointments             In 1 month [place], Charlane Ferretti, MD [place]              Signed Prescriptions Disp Refills   carvedilol (COREG) 6.25 MG tablet 70 tablet 0    Sig: TAKE 1 TABLET BY MOUTH TWICE DAILY WITH MEALS      Cardiovascular:   Beta Blockers Failed - 07/06/2020  2:06 PM      Failed - Valid encounter within last 6 months    Recent Outpatient Visits           7 months ago Nontraumatic subcortical hemorrhage of right cerebral hemisphere [facility])   [place] Charlott Rakes, MD       Future Appointments             In 1 month Charlott Rakes, MD [place] - Last BP in normal range    BP Readings from Last 1 Encounters:  06/06/20 (!) 102/59          Passed - Last Heart Rate in normal range    Pulse Readings from  Last 1 Encounters:  06/06/20 (!) 53

## 2020-07-06 NOTE — Telephone Encounter (Signed)
Requested Prescriptions  Pending Prescriptions Disp Refills  . carvedilol (COREG) 6.25 MG tablet [Pharmacy Med Name: CARVEDILOL 6.25MG  TABLETS] 70 tablet 0    Sig: TAKE 1 TABLET BY MOUTH TWICE DAILY WITH MEALS     Cardiovascular:  Beta Blockers Failed - 07/06/2020  2:06 PM      Failed - Valid encounter within last 6 months    Recent Outpatient Visits          7 months ago Nontraumatic subcortical hemorrhage of right cerebral hemisphere [facility])   [place], Charlane Ferretti, MD      Future Appointments            In 1 month Charlott Rakes, MD [place] BP in normal range    BP Readings from Last 1 Encounters:  06/06/20 (!) 102/59         Passed - Last Heart Rate in normal range    Pulse Readings from Last 1 Encounters:  06/06/20 (!) 53         . hydrALAZINE (APRESOLINE) 100 MG tablet [Pharmacy Med Name: HYDRALAZINE 100MG  (HUNDRED MG) TABS] 270 tablet 0    Sig: TAKE 1 TABLET BY MOUTH EVERY 8 HOURS     Cardiovascular:  Vasodilators Failed - 07/06/2020  2:06 PM      Failed - HCT in normal range and within 360 days    HCT  Date Value Ref Range Status  10/17/2019 35.3 (L) 39.0 - 52.0 % Final         Failed - HGB in normal range and within 360 days    Hemoglobin  Date Value Ref Range Status  10/17/2019 11.5 (L) 13.0 - 17.0 g/dL Final         Failed - RBC in normal range and within 360 days    RBC  Date Value Ref Range Status  10/17/2019 3.79 (L) 4.22 - 5.81 MIL/uL Final         Failed - PLT in normal range and within 360 days    Platelets  Date Value Ref Range Status  10/17/2019 414 (H) 150 - 400 K/uL Final         Passed - WBC in normal range and within 360 days    WBC  Date Value Ref Range Status  10/17/2019 6.3 4.0 - 10.5 K/uL Final         Passed - Last BP in normal range    BP Readings from Last 1 Encounters:  06/06/20 (!) 102/59         Passed - Valid encounter  within last 12 months    Recent Outpatient Visits          7 months ago Nontraumatic subcortical hemorrhage of right cerebral hemisphere [facility])   [place], [place], MD      Future Appointments            In 1 month Charlott Rakes, MD [place]           Patient has scheduled appointment 08/13/20. Gave enough med to get him to OV.

## 2020-07-11 ENCOUNTER — Other Ambulatory Visit: Payer: Self-pay | Admitting: Family Medicine

## 2020-07-11 DIAGNOSIS — I10 Essential (primary) hypertension: Secondary | ICD-10-CM

## 2020-07-18 ENCOUNTER — Encounter: Payer: Medicaid Other | Attending: Physical Medicine & Rehabilitation | Admitting: Physical Medicine & Rehabilitation

## 2020-07-18 ENCOUNTER — Encounter: Payer: Self-pay | Admitting: Physical Medicine & Rehabilitation

## 2020-07-18 ENCOUNTER — Other Ambulatory Visit: Payer: Self-pay

## 2020-07-18 VITALS — BP 159/86 | HR 62 | Temp 98.6°F | Ht 72.0 in

## 2020-07-18 DIAGNOSIS — G8114 Spastic hemiplegia affecting left nondominant side: Secondary | ICD-10-CM | POA: Diagnosis not present

## 2020-07-18 NOTE — Patient Instructions (Signed)
PLEASE FEEL FREE TO CALL OUR OFFICE WITH ANY PROBLEMS OR QUESTIONS ([phone])                                @                 @@               @@@                        @@@@                      @@@@@         @@@@@@                  @@@@@@@                @@@@@@@@              @@@@@@@@@             @@@@@@@@@@       IIII                  IIII                                                        HAPPY HOLIDAYS!!!!!  

## 2020-07-18 NOTE — Progress Notes (Signed)
Botox Injection for spasticity of upper extremity using needle EMG guidance Indication: Spastic hemiparesis of left nondominant side (HCC) G81,14  Dilution: 100 Units/ml        Total Units Injected: 500 Indication: Severe spasticity which interferes with ADL,mobility and/or  hygiene and is unresponsive to medication management and other conservative care Informed consent was obtained after describing risks and benefits of the procedure with the patient. This includes bleeding, bruising, infection, excessive weakness, or medication side effects. A REMS form is on file and signed.  Needle: 48mm injectable monopolar needle electrode    Number of units per muscle Pectoralis Major 0 units Pectoralis Minor 0 units Biceps 200 units Brachioradialis 100 units FCR1 0 units FCU 10 units FDS 90 units FDP 90 units FPL 0 units Palmaris Longus 0 units Pronator Teres 0 units Pronator Quadratus 0 units Lumbricals 0 units All injections were done after obtaining appropriate EMG activity and after negative drawback for blood. The patient tolerated the procedure well. Post procedure instructions were given. Return in about 2 months (around 09/18/2020).  Made referral to [facility] OT for LUE spasticity mgt

## 2020-08-01 ENCOUNTER — Other Ambulatory Visit: Payer: Self-pay | Admitting: Family Medicine

## 2020-08-01 DIAGNOSIS — I10 Essential (primary) hypertension: Secondary | ICD-10-CM

## 2020-08-13 ENCOUNTER — Ambulatory Visit: Payer: Medicaid Other | Attending: Family Medicine | Admitting: Family Medicine

## 2020-08-13 ENCOUNTER — Other Ambulatory Visit: Payer: Self-pay

## 2020-08-13 DIAGNOSIS — R4701 Aphasia: Secondary | ICD-10-CM

## 2020-08-13 DIAGNOSIS — I10 Essential (primary) hypertension: Secondary | ICD-10-CM

## 2020-08-13 DIAGNOSIS — G8114 Spastic hemiplegia affecting left nondominant side: Secondary | ICD-10-CM | POA: Diagnosis not present

## 2020-08-13 MED ORDER — CARVEDILOL 6.25 MG PO TABS: 6.2500 mg | ORAL_TABLET | Freq: Two times a day (BID) | ORAL | 1 refills | Status: DC

## 2020-08-13 MED ORDER — LOSARTAN POTASSIUM 50 MG PO TABS: 50.0000 mg | ORAL_TABLET | Freq: Every day | ORAL | 1 refills | Status: DC
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
## 2020-08-16 ENCOUNTER — Other Ambulatory Visit: Payer: Self-pay

## 2020-08-16 ENCOUNTER — Ambulatory Visit: Payer: Medicaid Other | Attending: Family Medicine

## 2020-08-16 ENCOUNTER — Telehealth: Payer: Self-pay

## 2020-08-16 ENCOUNTER — Other Ambulatory Visit: Payer: Self-pay | Admitting: Family Medicine

## 2020-08-16 DIAGNOSIS — I10 Essential (primary) hypertension: Secondary | ICD-10-CM

## 2020-08-16 NOTE — Telephone Encounter (Signed)
Met with the patient and his brother, Leroy Sea , when they were in the clinic today. They completed an application for a handicap placard. [name] requested that it be mailed to them after Dr Margarita Rana signs it.   The patient is interested in an ALF.  Explained to them that they need to contact the facilities that they are interested in and inquire if there are beds available.  They can then notify this CM for FL2 for the facility.  Leroy Sea said that he understood and they also understand that the patient would need to relinquish his monthly disability check to the facility except for $30-60.

## 2020-08-17 ENCOUNTER — Telehealth: Payer: Self-pay

## 2020-08-17 LAB — LIPID PANEL
Chol/HDL Ratio: 2.7 ratio (ref 0.0–5.0)
Cholesterol, Total: 107 mg/dL (ref 100–199)
HDL: 40 mg/dL (ref >39)
LDL Chol Calc (NIH): 45 mg/dL (ref 0–99)
Triglycerides: 126 mg/dL (ref 0–149)
VLDL Cholesterol Cal: 22 mg/dL (ref 5–40)

## 2020-08-17 LAB — CMP14+EGFR
ALT: 40 IU/L (ref 0–44)
AST: 26 IU/L (ref 0–40)
Albumin/Globulin Ratio: 1.4 (ref 1.2–2.2)
Albumin: 4.4 g/dL (ref 3.8–4.9)
Alkaline Phosphatase: 65 IU/L (ref 44–121)
BUN/Creatinine Ratio: 13 (ref 9–20)
BUN: 17 mg/dL (ref 6–24)
Bilirubin Total: 0.3 mg/dL (ref 0.0–1.2)
CO2: 28 mmol/L (ref 20–29)
Calcium: 9.8 mg/dL (ref 8.7–10.2)
Chloride: 99 mmol/L (ref 96–106)
Creatinine, Ser: 1.27 mg/dL (ref 0.76–1.27)
GFR calc Af Amer: 75 mL/min/{1.73_m2} (ref >59)
GFR calc non Af Amer: 65 mL/min/{1.73_m2} (ref >59)
Globulin, Total: 3.1 g/dL (ref 1.5–4.5)
Glucose: 92 mg/dL (ref 65–99)
Potassium: 4.1 mmol/L (ref 3.5–5.2)
Sodium: 142 mmol/L (ref 134–144)
Total Protein: 7.5 g/dL (ref 6.0–8.5)

## 2020-08-17 NOTE — Telephone Encounter (Signed)
-----   Message from Charlott Rakes, MD sent at 08/17/2020  9:31 AM EST ----- Please inform the patient that labs are normal. Thank you.

## 2020-08-17 NOTE — Telephone Encounter (Signed)
Pt picked up paperwork on 08/17/20

## 2020-08-17 NOTE — Telephone Encounter (Signed)
Patient name and DOB has been verified Patient was informed of lab results. Patient had no questions.

## 2020-08-28 ENCOUNTER — Other Ambulatory Visit: Payer: Self-pay | Admitting: Physical Medicine & Rehabilitation

## 2020-09-11 ENCOUNTER — Other Ambulatory Visit: Payer: Self-pay | Admitting: Family Medicine

## 2020-09-11 DIAGNOSIS — I10 Essential (primary) hypertension: Secondary | ICD-10-CM

## 2020-09-12 ENCOUNTER — Other Ambulatory Visit: Payer: Self-pay

## 2020-09-12 ENCOUNTER — Encounter: Payer: Self-pay | Admitting: Physical Medicine & Rehabilitation

## 2020-09-12 ENCOUNTER — Encounter: Payer: Medicaid Other | Attending: Physical Medicine & Rehabilitation | Admitting: Physical Medicine & Rehabilitation

## 2020-09-12 DIAGNOSIS — I10 Essential (primary) hypertension: Secondary | ICD-10-CM | POA: Insufficient documentation

## 2020-09-12 MED ORDER — HYDRALAZINE HCL 100 MG PO TABS: 50.0000 mg | ORAL_TABLET | Freq: Three times a day (TID) | ORAL | 0 refills | Status: DC
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
## 2020-09-12 NOTE — Patient Instructions (Addendum)
STOP GABAPENTIN   DECREASE HYDRALAZINE TO 50MG  EVERY 8 HOURS (1/2 TAB) FOR ONE WEEK. IF YOUR BP STAYS LESS THAN 135/80 THEN YOU CAN STOP THAT AS WELL.

## 2020-09-27 ENCOUNTER — Encounter: Payer: Self-pay | Admitting: Adult Health

## 2020-09-27 ENCOUNTER — Ambulatory Visit: Payer: Medicaid Other | Admitting: Adult Health

## 2020-09-27 VITALS — BP 128/72 | HR 57 | Ht 72.0 in

## 2020-09-27 DIAGNOSIS — G8110 Spastic hemiplegia affecting unspecified side: Secondary | ICD-10-CM

## 2020-09-27 DIAGNOSIS — I610 Nontraumatic intracerebral hemorrhage in hemisphere, subcortical: Secondary | ICD-10-CM
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
## 2020-09-27 NOTE — Progress Notes (Signed)
I agree with the above plan 

## 2020-10-03 ENCOUNTER — Telehealth: Payer: Self-pay

## 2020-10-03 NOTE — Telephone Encounter (Signed)
Per Paul [name] his therapist at [facility] would like him  receive Botox treatment, to the left arm.   Paul [name] has requested the Procedure appointment. Patient will have the last evaluation notes by [facility] faxed.  Will it be okay to schedule an appointment for Botox? (Last appointment here was on 09/12/2020, next appointment 12/12/2020).

## 2020-10-08 NOTE — Telephone Encounter (Signed)
That's fine

## 2020-10-15 ENCOUNTER — Other Ambulatory Visit: Payer: Self-pay | Admitting: Physical Medicine & Rehabilitation

## 2020-10-15 DIAGNOSIS — F329 Major depressive disorder, single episode, unspecified: Secondary | ICD-10-CM

## 2020-11-05 ENCOUNTER — Other Ambulatory Visit: Payer: Self-pay | Admitting: Physical Medicine & Rehabilitation

## 2020-12-12 ENCOUNTER — Encounter: Payer: Self-pay | Admitting: Physical Medicine & Rehabilitation

## 2020-12-12 ENCOUNTER — Encounter: Payer: Medicaid Other | Attending: Physical Medicine & Rehabilitation | Admitting: Physical Medicine & Rehabilitation

## 2020-12-12 ENCOUNTER — Other Ambulatory Visit: Payer: Self-pay

## 2020-12-12 VITALS — BP 157/90 | HR 56 | Temp 98.6°F | Ht 72.0 in

## 2020-12-12 DIAGNOSIS — G8114 Spastic hemiplegia affecting left nondominant side: Secondary | ICD-10-CM | POA: Diagnosis not present

## 2020-12-12 NOTE — Progress Notes (Signed)
Botox Injection for spasticity of upper extremity using needle EMG guidance Indication: Spastic hemiparesis of left nondominant side (HCC)   Dilution: 100 Units/ml        Total Units Injected: 500 Indication: Severe spasticity which interferes with ADL,mobility and/or  hygiene and is unresponsive to medication management and other conservative care Informed consent was obtained after describing risks and benefits of the procedure with the patient. This includes bleeding, bruising, infection, excessive weakness, or medication side effects. A REMS form is on file and signed.  Needle: 39mm injectable monopolar needle electrode    Number of units per muscle Pectoralis Major 0 units Pectoralis Minor 0 units Biceps 125 units Brachioradialis 75 units FCR 50 units FCU 50 units FDS 100 units FDP 100 units FPL 0 units Palmaris Longus 0 units Pronator Teres 0 units Pronator Quadratus 0 units Lumbricals 0 units All injections were done after obtaining appropriate EMG activity and after negative drawback for blood. The patient tolerated the procedure well. Post procedure instructions were given. Return in about 3 months (around 03/14/2021) for botox LUE wrist and elbow flexors 500u.

## 2020-12-12 NOTE — Patient Instructions (Signed)
PLEASE FEEL FREE TO CALL OUR OFFICE WITH ANY PROBLEMS OR QUESTIONS ([phone])      

## 2020-12-19 ENCOUNTER — Other Ambulatory Visit: Payer: Self-pay | Admitting: Physical Medicine & Rehabilitation

## 2020-12-19 DIAGNOSIS — F329 Major depressive disorder, single episode, unspecified: Secondary | ICD-10-CM

## 2021-01-28 ENCOUNTER — Other Ambulatory Visit: Payer: Self-pay | Admitting: Family Medicine

## 2021-01-28 ENCOUNTER — Other Ambulatory Visit: Payer: Self-pay | Admitting: Physical Medicine & Rehabilitation

## 2021-01-28 DIAGNOSIS — I10 Essential (primary) hypertension: Secondary | ICD-10-CM

## 2021-01-29 NOTE — Telephone Encounter (Signed)
Notes to clinic:  Medication requested was d/c  Review for continued use    Requested Prescriptions  Pending Prescriptions Disp Refills   hydrALAZINE (APRESOLINE) 100 MG tablet [Pharmacy Med Name: HYDRALAZINE 100MG  (HUNDRED MG) TABS] 30 tablet 0    Sig: TAKE 1 TABLET BY MOUTH EVERY 8 HOURS      Cardiovascular:  Vasodilators Failed - 01/28/2021 12:06 PM      Failed - HCT in normal range and within 360 days    HCT  Date Value Ref Range Status  10/17/2019 35.3 (L) 39.0 - 52.0 % Final          Failed - HGB in normal range and within 360 days    Hemoglobin  Date Value Ref Range Status  10/17/2019 11.5 (L) 13.0 - 17.0 g/dL Final          Failed - RBC in normal range and within 360 days    RBC  Date Value Ref Range Status  10/17/2019 3.79 (L) 4.22 - 5.81 MIL/uL Final          Failed - WBC in normal range and within 360 days    WBC  Date Value Ref Range Status  10/17/2019 6.3 4.0 - 10.5 K/uL Final          Failed - PLT in normal range and within 360 days    Platelets  Date Value Ref Range Status  10/17/2019 414 (H) 150 - 400 K/uL Final          Failed - Last BP in normal range    BP Readings from Last 1 Encounters:  12/12/20 (!) 157/90          Passed - Valid encounter within last 12 months    Recent Outpatient Visits           5 months ago Spastic hemiparesis of left nondominant side ([place])   [place], Charlane Ferretti, MD   1 year ago Nontraumatic subcortical hemorrhage of right cerebral hemisphere [facility])   [place], Charlane Ferretti, MD                 Signed Prescriptions Disp Refills   losartan (COZAAR) 50 MG tablet 30 tablet 0    Sig: TAKE 1 TABLET(50 MG) BY MOUTH DAILY      Cardiovascular:  Angiotensin Receptor Blockers Failed - 01/28/2021 12:06 PM      Failed - Last BP in normal range    BP Readings from Last 1 Encounters:  12/12/20 (!) 157/90          Passed - Cr in normal range  and within 180 days    Creatinine, Ser  Date Value Ref Range Status  08/16/2020 1.27 0.76 - 1.27 mg/dL Final          Passed - K in normal range and within 180 days    Potassium  Date Value Ref Range Status  08/16/2020 4.1 3.5 - 5.2 mmol/L Final          Passed - Patient is not pregnant      Passed - Valid encounter within last 6 months    Recent Outpatient Visits           5 months ago Spastic hemiparesis of left nondominant side ([place])   [place], Charlane Ferretti, MD   1 year ago Nontraumatic subcortical hemorrhage of right cerebral hemisphere [facility])   [facility] Charlott Rakes, MD

## 2021-02-12 ENCOUNTER — Other Ambulatory Visit: Payer: Self-pay | Admitting: Family Medicine

## 2021-02-12 DIAGNOSIS — I10 Essential (primary) hypertension: Secondary | ICD-10-CM

## 2021-02-12 NOTE — Telephone Encounter (Signed)
Requested medication (s) are due for refill today: yes  Requested medication (s) are on the active medication list: yes   Last refill:  11/11/2020  Future visit scheduled: no  Notes to clinic:  overdue for follow up appointment  Attempt to contact patient 2 times and phone keeps hanging up    Requested Prescriptions  Pending Prescriptions Disp Refills   carvedilol (COREG) 6.25 MG tablet [Pharmacy Med Name: CARVEDILOL 6.25MG  TABLETS] 180 tablet 1    Sig: TAKE 1 TABLET(6.25 MG) BY MOUTH TWICE DAILY WITH A MEAL      Cardiovascular:  Beta Blockers Failed - 02/12/2021 11:43 AM      Failed - Last BP in normal range    BP Readings from Last 1 Encounters:  12/12/20 (!) 157/90          Failed - Valid encounter within last 6 months    Recent Outpatient Visits           6 months ago Spastic hemiparesis of left nondominant side ([place])   [place], Charlane Ferretti, MD   1 year ago Nontraumatic subcortical hemorrhage of right cerebral hemisphere [facility])   [place], Charlane Ferretti, MD                Passed - Last Heart Rate in normal range    Pulse Readings from Last 1 Encounters:  12/12/20 (!) 56

## 2021-02-24 ENCOUNTER — Other Ambulatory Visit: Payer: Self-pay | Admitting: Family Medicine

## 2021-02-24 DIAGNOSIS — I10 Essential (primary) hypertension: Secondary | ICD-10-CM

## 2021-02-24 NOTE — Telephone Encounter (Signed)
Called pt and advised him that he is due for a office visit. Unfortunately the next available appt is 05/15/21. Advised pt will send back to office to see if he can get an earlier appointment.  Overdue lab work.  Previous refill was a 30 day courtesy RF. Requested Prescriptions  Pending Prescriptions Disp Refills   losartan (COZAAR) 50 MG tablet [Pharmacy Med Name: LOSARTAN '50MG'$  TABLETS] 30 tablet 0    Sig: TAKE 1 TABLET(50 MG) BY MOUTH DAILY      Cardiovascular:  Angiotensin Receptor Blockers Failed - 02/24/2021 11:30 AM      Failed - Cr in normal range and within 180 days    Creatinine, Ser  Date Value Ref Range Status  08/16/2020 1.27 0.76 - 1.27 mg/dL Final          Failed - K in normal range and within 180 days    Potassium  Date Value Ref Range Status  08/16/2020 4.1 3.5 - 5.2 mmol/L Final          Failed - Last BP in normal range    BP Readings from Last 1 Encounters:  12/12/20 (!) 157/90          Failed - Valid encounter within last 6 months    Recent Outpatient Visits           6 months ago Spastic hemiparesis of left nondominant side ([place])   [place], Charlane Ferretti, MD   1 year ago Nontraumatic subcortical hemorrhage of right cerebral hemisphere [facility])   [place] Charlott Rakes, MD       Future Appointments             In 2 months Charlott Rakes, MD [place] - Patient is not pregnant

## 2021-03-13 ENCOUNTER — Encounter: Payer: Medicaid Other | Attending: Physical Medicine & Rehabilitation | Admitting: Physical Medicine & Rehabilitation

## 2021-03-13 ENCOUNTER — Encounter: Payer: Self-pay | Admitting: Physical Medicine & Rehabilitation

## 2021-03-13 ENCOUNTER — Other Ambulatory Visit: Payer: Self-pay

## 2021-03-13 VITALS — BP 147/93 | HR 63 | Temp 98.2°F | Ht 72.0 in | Wt 167.0 lb

## 2021-03-13 DIAGNOSIS — G8114 Spastic hemiplegia affecting left nondominant side: Secondary | ICD-10-CM | POA: Diagnosis not present

## 2021-03-13 NOTE — Patient Instructions (Signed)
Have therapy submit for more visits or contact our office.

## 2021-03-13 NOTE — Progress Notes (Signed)
Botox Injection for spasticity of upper extremity using needle EMG guidance Indication: Spastic hemiparesis of left nondominant side (HCC)   Dilution: 100 Units/ml        Total Units Injected: 500 Indication: Severe spasticity which interferes with ADL,mobility and/or  hygiene and is unresponsive to medication management and other conservative care Informed consent was obtained after describing risks and benefits of the procedure with the patient. This includes bleeding, bruising, infection, excessive weakness, or medication side effects. A REMS form is on file and signed.  Needle: 35m injectable monopolar needle electrode    Number of units per muscle Pectoralis Major 0 units Pectoralis Minor 0 units Biceps 200 units Brachioradialis 100 units FCR 0 units FCU 0 units FDS 90 units FDP 90 units FPL 20 units Palmaris Longus 0 units Pronator Teres 0 units Pronator Quadratus 0 units Lumbricals 0 units All injections were done after obtaining appropriate EMG activity and after negative drawback for blood. The patient tolerated the procedure well. Post procedure instructions were given. Return in about 3 months (around 06/13/2021).

## 2021-03-14 ENCOUNTER — Other Ambulatory Visit: Payer: Self-pay | Admitting: Physical Medicine & Rehabilitation

## 2021-03-17 ENCOUNTER — Other Ambulatory Visit: Payer: Self-pay | Admitting: Family Medicine

## 2021-03-17 DIAGNOSIS — I10 Essential (primary) hypertension: Secondary | ICD-10-CM

## 2021-03-17 NOTE — Telephone Encounter (Signed)
Requested medication (s) are due for refill today: yes  Requested medication (s) are on the active medication list: yes  Last refill:  02/12/21 #60  Future visit scheduled: yes  Notes to clinic:  "Must have office visit for refills"   Requested Prescriptions  Pending Prescriptions Disp Refills   carvedilol (COREG) 6.25 MG tablet [Pharmacy Med Name: CARVEDILOL 6.'25MG'$  TABLETS] 60 tablet 0    Sig: TAKE 1 TABLET(6.25 MG) BY MOUTH TWICE DAILY WITH A MEAL     Cardiovascular:  Beta Blockers Failed - 03/17/2021 11:40 AM      Failed - Last BP in normal range    BP Readings from Last 1 Encounters:  03/13/21 (!) 147/93          Failed - Valid encounter within last 6 months    Recent Outpatient Visits           7 months ago Spastic hemiparesis of left nondominant side ([place])   [place], Charlane Ferretti, MD   1 year ago Nontraumatic subcortical hemorrhage of right cerebral hemisphere [facility])   [place] Charlott Rakes, MD       Future Appointments             In 1 month Charlott Rakes, MD [place] in normal range    Pulse Readings from Last 1 Encounters:  03/13/21 63

## 2021-04-03 ENCOUNTER — Other Ambulatory Visit: Payer: Self-pay | Admitting: Physical Medicine & Rehabilitation

## 2021-04-16 ENCOUNTER — Other Ambulatory Visit: Payer: Self-pay | Admitting: Family Medicine

## 2021-04-16 DIAGNOSIS — I10 Essential (primary) hypertension: Secondary | ICD-10-CM

## 2021-04-18 ENCOUNTER — Other Ambulatory Visit: Payer: Self-pay | Admitting: Family Medicine

## 2021-04-18 DIAGNOSIS — I10 Essential (primary) hypertension: Secondary | ICD-10-CM

## 2021-04-18 NOTE — Telephone Encounter (Signed)
Pharmacy reports they did not receive refill 04/16/21. Resent.

## 2021-04-30 ENCOUNTER — Other Ambulatory Visit: Payer: Self-pay | Admitting: Physical Medicine & Rehabilitation

## 2021-04-30 DIAGNOSIS — F329 Major depressive disorder, single episode, unspecified: Secondary | ICD-10-CM

## 2021-05-07 ENCOUNTER — Telehealth: Payer: Self-pay | Admitting: Family Medicine

## 2021-05-07 NOTE — Telephone Encounter (Signed)
Provider is out of the office on 10/19 in the evening. Called patient and left vm to call [phone] to reschedule appt

## 2021-05-15 ENCOUNTER — Ambulatory Visit: Payer: Medicaid Other | Admitting: Family Medicine

## 2021-05-21 ENCOUNTER — Encounter: Payer: Self-pay | Admitting: Family Medicine

## 2021-05-21 ENCOUNTER — Ambulatory Visit: Payer: Medicaid Other | Attending: Family Medicine | Admitting: Family Medicine

## 2021-05-21 ENCOUNTER — Other Ambulatory Visit: Payer: Self-pay

## 2021-05-21 VITALS — BP 220/134 | HR 61

## 2021-05-21 DIAGNOSIS — Z8673 Personal history of transient ischemic attack (TIA), and cerebral infarction without residual deficits: Secondary | ICD-10-CM | POA: Diagnosis not present

## 2021-05-21 DIAGNOSIS — Z1159 Encounter for screening for other viral diseases: Secondary | ICD-10-CM

## 2021-05-21 DIAGNOSIS — R7303 Prediabetes: Secondary | ICD-10-CM

## 2021-05-21 DIAGNOSIS — I69354 Hemiplegia and hemiparesis following cerebral infarction affecting left non-dominant side: Secondary | ICD-10-CM

## 2021-05-21 DIAGNOSIS — I10 Essential (primary) hypertension: Secondary | ICD-10-CM | POA: Diagnosis not present

## 2021-05-21 MED ORDER — LOSARTAN POTASSIUM 100 MG PO TABS: 100.0000 mg | ORAL_TABLET | Freq: Every day | ORAL | 1 refills | Status: DC

## 2021-05-21 MED ORDER — ROSUVASTATIN CALCIUM 20 MG PO TABS: 20.0000 mg | ORAL_TABLET | Freq: Every day | ORAL | 1 refills | Status: DC

## 2021-05-21 MED ORDER — CARVEDILOL 6.25 MG PO TABS: 6.2500 mg | ORAL_TABLET | Freq: Two times a day (BID) | ORAL | 1 refills | Status: DC

## 2021-05-21 NOTE — Patient Instructions (Signed)
Managing Your Hypertension Hypertension, also called high blood pressure, is when the force of the blood pressing against the walls of the arteries is too strong. Arteries are blood vessels that carry blood from your heart throughout your body. Hypertension forces the heart to work harder to pump blood and may cause the arteries tobecome narrow or stiff. Understanding blood pressure readings Your personal target blood pressure may vary depending on your medical conditions, your age, and other factors. A blood pressure reading includes a higher number over a lower number. Ideally, your blood pressure should be below 120/80. You should know that: The first, or top, number is called the systolic pressure. It is a measure of the pressure in your arteries as your heart beats. The second, or bottom number, is called the diastolic pressure. It is a measure of the pressure in your arteries as the heart relaxes. Blood pressure is classified into four stages. Based on your blood pressure reading, your health care provider may use the following stages to determine what type of treatment you need, if any. Systolic pressure and diastolicpressure are measured in a unit called mmHg. Normal Systolic pressure: below 120. Diastolic pressure: below 80. Elevated Systolic pressure: 120-129. Diastolic pressure: below 80. Hypertension stage 1 Systolic pressure: 130-139. Diastolic pressure: 80-89. Hypertension stage 2 Systolic pressure: 140 or above. Diastolic pressure: 90 or above. How can this condition affect me? Managing your hypertension is an important responsibility. Over time, hypertension can damage the arteries and decrease blood flow to important parts of the body, including the brain, heart, and kidneys. Having untreated or uncontrolled hypertension can lead to: A heart attack. A stroke. A weakened blood vessel (aneurysm). Heart failure. Kidney damage. Eye damage. Metabolic syndrome. Memory and  concentration problems. Vascular dementia. What actions can I take to manage this condition? Hypertension can be managed by making lifestyle changes and possibly by taking medicines. Your health care provider will help you make a plan to bring yourblood pressure within a normal range. Nutrition  Eat a diet that is high in fiber and potassium, and low in salt (sodium), added sugar, and fat. An example eating plan is called the Dietary Approaches to Stop Hypertension (DASH) diet. To eat this way: Eat plenty of fresh fruits and vegetables. Try to fill one-half of your plate at each meal with fruits and vegetables. Eat whole grains, such as whole-wheat pasta, brown rice, or whole-grain bread. Fill about one-fourth of your plate with whole grains. Eat low-fat dairy products. Avoid fatty cuts of meat, processed or cured meats, and poultry with skin. Fill about one-fourth of your plate with lean proteins such as fish, chicken without skin, beans, eggs, and tofu. Avoid pre-made and processed foods. These tend to be higher in sodium, added sugar, and fat. Reduce your daily sodium intake. Most people with hypertension should eat less than 1,500 mg of sodium a day.  Lifestyle  Work with your health care provider to maintain a healthy body weight or to lose weight. Ask what an ideal weight is for you. Get at least 30 minutes of exercise that causes your heart to beat faster (aerobic exercise) most days of the week. Activities may include walking, swimming, or biking. Include exercise to strengthen your muscles (resistance exercise), such as weight lifting, as part of your weekly exercise routine. Try to do these types of exercises for 30 minutes at least 3 days a week. Do not use any products that contain nicotine or tobacco, such as cigarettes, e-cigarettes, and chewing   tobacco. If you need help quitting, ask your health care provider. Control any long-term (chronic) conditions you have, such as high  cholesterol or diabetes. Identify your sources of stress and find ways to manage stress. This may include meditation, deep breathing, or making time for fun activities.  Alcohol use Do not drink alcohol if: Your health care provider tells you not to drink. You are pregnant, may be pregnant, or are planning to become pregnant. If you drink alcohol: Limit how much you use to: 0-1 drink a day for women. 0-2 drinks a day for men. Be aware of how much alcohol is in your drink. In the [place] one drink equals one 12 oz bottle of beer (355 mL), one 5 oz glass of wine (148 mL), or one 1 oz glass of hard liquor (44 mL). Medicines Your health care provider may prescribe medicine if lifestyle changes are not enough to get your blood pressure under control and if: Your systolic blood pressure is 130 or higher. Your diastolic blood pressure is 80 or higher. Take medicines only as told by your health care provider. Follow the directions carefully. Blood pressure medicines must be taken as told by your health care provider. The medicine does not work as well when you skip doses. Skippingdoses also puts you at risk for problems. Monitoring Before you monitor your blood pressure: Do not smoke, drink caffeinated beverages, or exercise within 30 minutes before taking a measurement. Use the bathroom and empty your bladder (urinate). Sit quietly for at least 5 minutes before taking measurements. Monitor your blood pressure at home as told by your health care provider. To do this: Sit with your back straight and supported. Place your feet flat on the floor. Do not cross your legs. Support your arm on a flat surface, such as a table. Make sure your upper arm is at heart level. Each time you measure, take two or three readings one minute apart and record the results. You may also need to have your blood pressure checked regularly by your healthcare provider. General information Talk with your health care  provider about your diet, exercise habits, and other lifestyle factors that may be contributing to hypertension. Review all the medicines you take with your health care provider because there may be side effects or interactions. Keep all visits as told by your health care provider. Your health care provider can help you create and adjust your plan for managing your high blood pressure. Where to find more information National Heart, Lung, and Blood Institute: www.nhlbi.nih.gov American Heart Association: www.heart.org Contact a health care provider if: You think you are having a reaction to medicines you have taken. You have repeated (recurrent) headaches. You feel dizzy. You have swelling in your ankles. You have trouble with your vision. Get help right away if: You develop a severe headache or confusion. You have unusual weakness or numbness, or you feel faint. You have severe pain in your chest or abdomen. You vomit repeatedly. You have trouble breathing. These symptoms may represent a serious problem that is an emergency. Do not wait to see if the symptoms will go away. Get medical help right away. Call your local emergency services (911 in the [place] Do not drive yourself to the hospital. Summary Hypertension is when the force of blood pumping through your arteries is too strong. If this condition is not controlled, it may put you at risk for serious complications. Your personal target blood pressure may vary depending on your medical conditions,   your age, and other factors. For most people, a normal blood pressure is less than 120/80. Hypertension is managed by lifestyle changes, medicines, or both. Lifestyle changes to help manage hypertension include losing weight, eating a healthy, low-sodium diet, exercising more, stopping smoking, and limiting alcohol. This information is not intended to replace advice given to you by your health care provider. Make sure you discuss any questions  you have with your healthcare provider. Document Revised: 08/19/2019 Document Reviewed: 06/14/2019 Elsevier Patient Education  2022 [employer]  
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
## 2021-05-22 ENCOUNTER — Encounter: Payer: Self-pay | Admitting: Family Medicine

## 2021-05-22 LAB — BASIC METABOLIC PANEL
BUN/Creatinine Ratio: 14 (ref 9–20)
BUN: 15 mg/dL (ref 6–24)
CO2: 23 mmol/L (ref 20–29)
Calcium: 10.2 mg/dL (ref 8.7–10.2)
Chloride: 102 mmol/L (ref 96–106)
Creatinine, Ser: 1.11 mg/dL (ref 0.76–1.27)
Glucose: 86 mg/dL (ref 70–99)
Potassium: 4.4 mmol/L (ref 3.5–5.2)
Sodium: 140 mmol/L (ref 134–144)
eGFR: 79 mL/min/{1.73_m2} (ref >59)

## 2021-05-22 LAB — HEMOGLOBIN A1C
Est. average glucose Bld gHb Est-mCnc: 120 mg/dL
Hgb A1c MFr Bld: 5.8 % — ABNORMAL HIGH (ref 4.8–5.6)

## 2021-05-22 LAB — HCV INTERPRETATION

## 2021-05-22 LAB — HCV AB W REFLEX TO QUANT PCR: HCV Ab: 0.2 s/co ratio (ref 0.0–0.9)

## 2021-05-28 ENCOUNTER — Telehealth: Payer: Self-pay

## 2021-05-28 NOTE — Telephone Encounter (Signed)
Pt called and VM is currently full and can not accept any messages.  CRM created.

## 2021-05-28 NOTE — Telephone Encounter (Signed)
-----   Message from Charlott Rakes, MD sent at 05/22/2021  8:49 AM EDT ----- Please inform him that his A1c is prediabetic at 5.8 but this has improved from 6.2 previously other labs are stable.

## 2021-05-31 ENCOUNTER — Other Ambulatory Visit: Payer: Self-pay

## 2021-05-31 ENCOUNTER — Ambulatory Visit: Payer: Medicaid Other | Attending: Family Medicine | Admitting: Pharmacist

## 2021-05-31 VITALS — BP 158/93

## 2021-05-31 DIAGNOSIS — I10 Essential (primary) hypertension: Secondary | ICD-10-CM

## 2021-05-31 NOTE — Progress Notes (Signed)
   S:    PCP: Dr. Margarita Rana   Patient arrives in good spirits. Presents to the clinic for hypertension evaluation, counseling, and management. Patient was referred and last seen by Primary Care Provider on 05/21/2021. His BP was severely elevated that day. His losartan dose was increased. Today, he tells me he was upset at that appointment and that is why his BP was elevated.    Medication adherence reported. Took both BP medications this morning around 7:30 am.   Denies any current chest pain, dyspnea, HA or blurred vision.   Current BP Medications include: carvedilol 6.25 mg BID, losartan 100 mg daily   Antihypertensives tried in the past include: amlodipine (stopped d/t bradycardia), hydralazine (weaned off previously d/t hypotension)  Dietary habits include: denies adding salt to food, does not eat a lot of fast food. Does admit to eating country ham often for breakfast. Denies excessive intake of caffeine.  Exercise habits include: exercise is limited  Family / Social history:  -Fhx:  -Tobacco:  -Alcohol:     O:  Today's Vitals   05/31/21 0931  BP: (!) 158/93   There is no height or weight on file to calculate BMI.  Home BP readings: none  Last 3 Office BP readings: BP Readings from Last 3 Encounters:  05/21/21 (!) 220/134  03/13/21 (!) 147/93  12/12/20 (!) 157/90    BMET    Component Value Date/Time   NA 140 05/21/2021 1527   K 4.4 05/21/2021 1527   CL 102 05/21/2021 1527   CO2 23 05/21/2021 1527   GLUCOSE 86 05/21/2021 1527   GLUCOSE 115 (H) 10/17/2019 0550   BUN 15 05/21/2021 1527   CREATININE 1.11 05/21/2021 1527   CALCIUM 10.2 05/21/2021 1527   CALCIUM 9.7 05/21/2018 0419   GFRNONAA 65 08/16/2020 1013   GFRAA 75 08/16/2020 1013    Renal function: CrCl cannot be calculated (Unknown ideal weight.).  Clinical ASCVD: Yes  The ASCVD Risk score (Arnett DK, et al., 2019) failed to calculate for the following reasons:   The patient has a prior MI or stroke  diagnosis   A/P: Hypertension longstanding currently uncontrolled but improved on current medications. BP Goal = < 130/80 mmHg. Medication adherence reported. His losartan dose was just increased last week. Will get BMP today and hold off on any further changes at this time given his improvement. Will see him back in 1 month for reassessment.   -Continued current regimen.  -Counseled on lifestyle modifications for blood pressure control including reduced dietary sodium, increased exercise, adequate sleep.  Results reviewed and written information provided.   Total time in face-to-face counseling 30 minutes.   F/U Clinic Visit in 1 month.   Benard Halsted, PharmD, [place], [place] [phone]

## 2021-06-01 LAB — BASIC METABOLIC PANEL
BUN/Creatinine Ratio: 12 (ref 9–20)
BUN: 15 mg/dL (ref 6–24)
CO2: 26 mmol/L (ref 20–29)
Calcium: 10.2 mg/dL (ref 8.7–10.2)
Chloride: 99 mmol/L (ref 96–106)
Creatinine, Ser: 1.22 mg/dL (ref 0.76–1.27)
Glucose: 97 mg/dL (ref 70–99)
Potassium: 4.3 mmol/L (ref 3.5–5.2)
Sodium: 141 mmol/L (ref 134–144)
eGFR: 71 mL/min/{1.73_m2} (ref >59)

## 2021-06-03 ENCOUNTER — Telehealth: Payer: Self-pay

## 2021-06-03 NOTE — Telephone Encounter (Signed)
Pt was called and a VM was left informing patient of lab results. 

## 2021-06-03 NOTE — Telephone Encounter (Signed)
-----   Message from Charlott Rakes, MD sent at 06/03/2021 12:50 PM EST ----- Please inform the patient that labs are normal. Thank you.

## 2021-06-12 ENCOUNTER — Other Ambulatory Visit: Payer: Self-pay

## 2021-06-12 ENCOUNTER — Encounter: Payer: Self-pay | Admitting: Physical Medicine & Rehabilitation

## 2021-06-12 ENCOUNTER — Telehealth: Payer: Self-pay

## 2021-06-12 ENCOUNTER — Encounter: Payer: Medicaid Other | Attending: Physical Medicine & Rehabilitation | Admitting: Physical Medicine & Rehabilitation

## 2021-06-12 VITALS — BP 151/90 | HR 63 | Temp 97.7°F | Ht 72.0 in

## 2021-06-12 DIAGNOSIS — G8114 Spastic hemiplegia affecting left nondominant side: Secondary | ICD-10-CM | POA: Diagnosis not present

## 2021-06-12 NOTE — Progress Notes (Signed)
Botox Injection for spasticity of upper extremity using needle EMG guidance Indication: Spastic hemiparesis of left nondominant side (HCC) G81.14  Dilution: 100 Units/ml        Total Units Injected: 500 Indication: Severe spasticity which interferes with ADL,mobility and/or  hygiene and is unresponsive to medication management and other conservative care Informed consent was obtained after describing risks and benefits of the procedure with the patient. This includes bleeding, bruising, infection, excessive weakness, or medication side effects. A REMS form is on file and signed.  Needle: 28mm injectable monopolar needle electrode    Number of units per muscle Pectoralis Major 0 units Pectoralis Minor 0 units Biceps 200 units Brachioradialis 50 units FCR 25 units FCU 25 units FDS 100 units FDP 100 units FPL 0 units Palmaris Longus 0 units Pronator Teres 0 units Pronator Quadratus 0 units Lumbricals 0 units All injections were done after obtaining appropriate EMG activity and after negative drawback for blood. The patient tolerated the procedure well. Post procedure instructions were given. Return in about 3 months (around 09/12/2021) for botox 400 u RUE biceps, finger and wrist flexors, forearm pronators.

## 2021-06-12 NOTE — Patient Instructions (Signed)
PLEASE FEEL FREE TO CALL OUR OFFICE WITH ANY PROBLEMS OR QUESTIONS ([phone])      

## 2021-06-17 NOTE — Telephone Encounter (Signed)
Task completed

## 2021-07-05 ENCOUNTER — Other Ambulatory Visit: Payer: Self-pay

## 2021-07-05 ENCOUNTER — Ambulatory Visit: Payer: Medicaid Other | Attending: Family Medicine | Admitting: Pharmacist

## 2021-07-05 ENCOUNTER — Encounter: Payer: Self-pay | Admitting: Pharmacist

## 2021-07-05 VITALS — BP 165/89 | HR 62

## 2021-07-05 DIAGNOSIS — I10 Essential (primary) hypertension: Secondary | ICD-10-CM

## 2021-07-05 MED ORDER — LOSARTAN POTASSIUM-HCTZ 100-25 MG PO TABS: 1.0000 | ORAL_TABLET | Freq: Every day | ORAL | 0 refills | Status: DC

## 2021-07-05 NOTE — Progress Notes (Signed)
   S:    PCP: Dr. Margarita Rana   Patient arrives in good spirits. Presents to the clinic for hypertension evaluation, counseling, and management. Patient was referred and last seen by Primary Care Provider on 05/21/2021. When I saw him on 05/31/2021, his BP revealed improvement but was still elevated. Labs from that appt were stable.  We held off on changes and instructed him to come back today for reassessment.   Medication adherence reported. Has taken both BP medications this morning around 7:30 am.   Denies any current chest pain, dyspnea, HA or blurred vision.   Current BP Medications include: carvedilol 6.25 mg BID, losartan 100 mg daily   Antihypertensives tried in the past include: amlodipine (stopped d/t bradycardia), hydralazine (weaned off previously d/t hypotension)  Dietary habits include: denies adding salt to food, does not eat a lot of fast food. Does admit to eating country ham often for breakfast. Denies excessive intake of caffeine.  Exercise habits include: exercise is limited  Family / Social history:  -Fhx: DM, stroke  -Tobacco: former smoker  -Alcohol: 12 standard drinks weekly listed on his social hx    O:  Today's Vitals   07/05/21 0924  BP: (!) 165/89  Pulse: 62    There is no height or weight on file to calculate BMI.  Home BP readings: none  Last 3 Office BP readings: BP Readings from Last 3 Encounters:  07/05/21 (!) 165/89  06/12/21 (!) 151/90  05/31/21 (!) 158/93    BMET    Component Value Date/Time   NA 141 05/31/2021 0933   K 4.3 05/31/2021 0933   CL 99 05/31/2021 0933   CO2 26 05/31/2021 0933   GLUCOSE 97 05/31/2021 0933   GLUCOSE 115 (H) 10/17/2019 0550   BUN 15 05/31/2021 0933   CREATININE 1.22 05/31/2021 0933   CALCIUM 10.2 05/31/2021 0933   CALCIUM 9.7 05/21/2018 0419   GFRNONAA 65 08/16/2020 1013   GFRAA 75 08/16/2020 1013    Renal function: CrCl cannot be calculated (Patient's most recent lab result is older than the maximum  21 days allowed.).  Clinical ASCVD: Yes  The ASCVD Risk score (Arnett DK, et al., 2019) failed to calculate for the following reasons:   The patient has a prior MI or stroke diagnosis   A/P: Hypertension longstanding currently uncontrolled on current medications. BP Goal = < 130/80 mmHg. Medication adherence reported. Renal function and electrolyte status stable. No allergies or prior intolerances to thiazides. Would love to add chlorthalidone given his hx of stroke, however, pt would prefer less pill burden. Will change losartan to losartan-HCTZ. HR is too low today to adjust carvedilol dose.  -Continued carvedilol at current dose.  -Change losartan 100 mg daily to losartan-HCTZ 100-25 mg daily.  -Counseled on lifestyle modifications for blood pressure control including reduced dietary sodium, increased exercise, adequate sleep. -Plan on BMP in 1 month.   Results reviewed and written information provided.   Total time in face-to-face counseling 30 minutes.   F/U Clinic Visit in 1 month.   Benard Halsted, PharmD, [facility], [place] [phone]

## 2021-08-06 ENCOUNTER — Ambulatory Visit: Payer: Medicaid Other | Admitting: Family Medicine

## 2021-08-09 ENCOUNTER — Other Ambulatory Visit: Payer: Self-pay

## 2021-08-09 ENCOUNTER — Ambulatory Visit: Payer: Medicaid Other | Attending: Family Medicine | Admitting: Pharmacist

## 2021-08-09 VITALS — BP 131/87 | HR 67

## 2021-08-09 DIAGNOSIS — I10 Essential (primary) hypertension: Secondary | ICD-10-CM

## 2021-08-09 MED ORDER — LOSARTAN POTASSIUM-HCTZ 100-25 MG PO TABS: 1.0000 | ORAL_TABLET | Freq: Every day | ORAL | 0 refills | Status: DC

## 2021-08-09 MED ORDER — CARVEDILOL 6.25 MG PO TABS: 6.2500 mg | ORAL_TABLET | Freq: Two times a day (BID) | ORAL | 1 refills | Status: DC

## 2021-08-09 NOTE — Progress Notes (Signed)
° °  S:    PCP: Dr. Margarita Rana   Patient arrives in good spirits. Presents to the clinic for hypertension evaluation, counseling, and management. Patient was referred and last seen by Primary Care Provider on 05/21/2021. When I saw him on 07/05/2021 and changed his losartan to losartan-HCTZ 100-25 mg daily.   Medication adherence reported. Has taken his BP medications this morning around 7:30 am.   Denies any current chest pain, dyspnea, HA or blurred vision.   Current BP Medications include: carvedilol 6.25 mg BID, losartan 100-25 mg daily   Antihypertensives tried in the past include: amlodipine (stopped d/t bradycardia), hydralazine (weaned off previously d/t hypotension)  Dietary habits include: denies adding salt to food, does not eat a lot of fast food. Does admit to eating country ham often for breakfast. Denies excessive intake of caffeine.  Exercise habits include: exercise is limited  Family / Social history:  -Fhx: DM, stroke  -Tobacco: former smoker  -Alcohol: 12 standard drinks weekly listed on his social hx    O:  Today's Vitals   08/09/21 0954  BP: 131/87  Pulse: 67   There is no height or weight on file to calculate BMI.  Home BP readings: none  Last 3 Office BP readings: BP Readings from Last 3 Encounters:  08/09/21 131/87  07/05/21 (!) 165/89  06/12/21 (!) 151/90    BMET    Component Value Date/Time   NA 141 05/31/2021 0933   K 4.3 05/31/2021 0933   CL 99 05/31/2021 0933   CO2 26 05/31/2021 0933   GLUCOSE 97 05/31/2021 0933   GLUCOSE 115 (H) 10/17/2019 0550   BUN 15 05/31/2021 0933   CREATININE 1.22 05/31/2021 0933   CALCIUM 10.2 05/31/2021 0933   CALCIUM 9.7 05/21/2018 0419   GFRNONAA 65 08/16/2020 1013   GFRAA 75 08/16/2020 1013    Renal function: CrCl cannot be calculated (Patient's most recent lab result is older than the maximum 21 days allowed.).  Clinical ASCVD: Yes  The ASCVD Risk score (Arnett DK, et al., 2019) failed to calculate for  the following reasons:   The patient has a prior MI or stroke diagnosis   A/P: Hypertension longstanding currently close to goal on current medications. BP Goal = < 130/80 mmHg. Medication adherence reported.   -Continued carvedilol at current dose.  -Continued losartan-HCTZ 100-25 mg daily.  -CMP14+eGFR -Counseled on lifestyle modifications for blood pressure control including reduced dietary sodium, increased exercise, adequate sleep.  Results reviewed and written information provided.   Total time in face-to-face counseling 30 minutes.   F/U Clinic Visit w/ PCP next month.   Benard Halsted, PharmD, [facility], [place] [phone]

## 2021-08-10 LAB — CMP14+EGFR
ALT: 36 IU/L (ref 0–44)
AST: 27 IU/L (ref 0–40)
Albumin/Globulin Ratio: 1.8 (ref 1.2–2.2)
Albumin: 4.7 g/dL (ref 3.8–4.9)
Alkaline Phosphatase: 58 IU/L (ref 44–121)
BUN/Creatinine Ratio: 12 (ref 9–20)
BUN: 16 mg/dL (ref 6–24)
Bilirubin Total: 0.7 mg/dL (ref 0.0–1.2)
CO2: 25 mmol/L (ref 20–29)
Calcium: 10 mg/dL (ref 8.7–10.2)
Chloride: 100 mmol/L (ref 96–106)
Creatinine, Ser: 1.29 mg/dL — ABNORMAL HIGH (ref 0.76–1.27)
Globulin, Total: 2.6 g/dL (ref 1.5–4.5)
Glucose: 71 mg/dL (ref 70–99)
Potassium: 4.3 mmol/L (ref 3.5–5.2)
Sodium: 143 mmol/L (ref 134–144)
Total Protein: 7.3 g/dL (ref 6.0–8.5)
eGFR: 66 mL/min/{1.73_m2} (ref >59)

## 2021-08-12 ENCOUNTER — Telehealth: Payer: Self-pay

## 2021-08-12 NOTE — Telephone Encounter (Signed)
-----   Message from Ladell Pier, MD sent at 08/10/2021  4:50 PM EST ----- Let pt know that kidney function is not 100% but stable.  Liver function tests normal.

## 2021-08-12 NOTE — Telephone Encounter (Signed)
Pt was called and no VM is set up to leave a message.  CRM created, letter mailed.

## 2021-08-15 ENCOUNTER — Telehealth: Payer: Self-pay

## 2021-08-15 NOTE — Telephone Encounter (Signed)
Patient name and DOB has been verified Patient was informed of lab results. Patient had no questions.  

## 2021-08-15 NOTE — Telephone Encounter (Signed)
-----   Message from Ladell Pier, MD sent at 08/10/2021  4:50 PM EST ----- Let pt know that kidney function is not 100% but stable.  Liver function tests normal.

## 2021-08-29 ENCOUNTER — Ambulatory Visit: Payer: Medicaid Other | Attending: Family Medicine | Admitting: Family Medicine

## 2021-08-29 ENCOUNTER — Encounter: Payer: Self-pay | Admitting: Family Medicine

## 2021-08-29 ENCOUNTER — Other Ambulatory Visit: Payer: Self-pay

## 2021-08-29 VITALS — BP 148/95 | HR 66 | Ht 72.0 in

## 2021-08-29 DIAGNOSIS — G8114 Spastic hemiplegia affecting left nondominant side: Secondary | ICD-10-CM

## 2021-08-29 DIAGNOSIS — Z8673 Personal history of transient ischemic attack (TIA), and cerebral infarction without residual deficits: Secondary | ICD-10-CM | POA: Diagnosis not present

## 2021-08-29 DIAGNOSIS — I10 Essential (primary) hypertension: Secondary | ICD-10-CM

## 2021-08-29 MED ORDER — ROSUVASTATIN CALCIUM 20 MG PO TABS: 20.0000 mg | ORAL_TABLET | Freq: Every day | ORAL | 1 refills | Status: DC

## 2021-08-29 MED ORDER — ZOSTER VAC RECOMB ADJUVANTED 50 MCG/0.5ML IM SUSR: 0.5000 mL | Freq: Once | INTRAMUSCULAR | 2 refills | Status: AC

## 2021-08-29 NOTE — Patient Instructions (Signed)
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
## 2021-09-11 ENCOUNTER — Encounter: Payer: Medicaid Other | Admitting: Physical Medicine & Rehabilitation

## 2021-09-25 ENCOUNTER — Encounter: Payer: Self-pay | Admitting: Physical Medicine & Rehabilitation

## 2021-09-25 ENCOUNTER — Encounter: Payer: Medicaid Other | Attending: Physical Medicine & Rehabilitation | Admitting: Physical Medicine & Rehabilitation

## 2021-09-25 ENCOUNTER — Other Ambulatory Visit: Payer: Self-pay

## 2021-09-25 VITALS — BP 141/86 | HR 63 | Ht 72.0 in

## 2021-09-25 DIAGNOSIS — G8114 Spastic hemiplegia affecting left nondominant side: Secondary | ICD-10-CM | POA: Diagnosis present

## 2021-09-25 MED ORDER — DANTROLENE SODIUM 25 MG PO CAPS: 25.0000 mg | ORAL_CAPSULE | Freq: Three times a day (TID) | ORAL | 3 refills | Status: DC

## 2021-09-25 NOTE — Patient Instructions (Addendum)
PLEASE FEEL FREE TO CALL OUR OFFICE WITH ANY PROBLEMS OR QUESTIONS ([phone]) ? ?DANTRIUM/DANTROLENE: ?25MG  AT NIGHT FOR 3 DAYS, THEN TWICE DAILY FOR 3 DAYS, THEN THREE X DAILY ?                ?

## 2021-09-25 NOTE — Progress Notes (Signed)
Botox Injection for spasticity of upper extremity using needle EMG guidance ?Indication: Spastic hemiparesis of left nondominant side (HCC) ?G81.14 ? ?Dilution: 100 Units/ml        Total Units Injected: 400 ?Indication: Severe spasticity which interferes with ADL,mobility and/or  hygiene and is unresponsive to medication management and other conservative care ?Informed consent was obtained after describing risks and benefits of the procedure with the patient. This includes bleeding, bruising, infection, excessive weakness, or medication side effects. A REMS form is on file and signed. ? ?Needle: 38mm injectable monopolar needle electrode ? ?  ?Number of units per muscle ?Pectoralis Major 0 units ?Pectoralis Minor 0 units ?Biceps 50 units ?Brachioradialis 50 units ?FCR 25 units ?FCU 25 units ?FDS 100 units ?FDP 100 units ?FPL 0 units ?Palmaris Longus 50 units ?Pronator Teres 0 units ?Pronator Quadratus 0 units ?Lumbricals 0 units ?All injections were done after obtaining appropriate EMG activity and after negative drawback for blood. The patient tolerated the procedure well. Post procedure instructions were given. Return in about 3 months (around 12/26/2021) for dysport RUE biceps, brachioradialis, wrist and finger flexors, pronator teres. ?  ?

## 2021-09-27 ENCOUNTER — Other Ambulatory Visit: Payer: Self-pay | Admitting: Family Medicine

## 2021-09-27 ENCOUNTER — Telehealth: Payer: Self-pay

## 2021-09-27 DIAGNOSIS — I10 Essential (primary) hypertension: Secondary | ICD-10-CM

## 2021-09-27 NOTE — Telephone Encounter (Signed)
PA for Dantrolene submitted to insurance. ?

## 2021-09-27 NOTE — Telephone Encounter (Signed)
rx was dc'd on 07/05/21 for change in therapy. Pt was placed on losartan-hctz ?Requested Prescriptions  ?Refused Prescriptions Disp Refills  ?? losartan (COZAAR) 100 MG tablet [Pharmacy Med Name: LOSARTAN 100MG  TABLETS] 90 tablet 1  ?  Sig: TAKE 1 TABLET(100 MG) BY MOUTH DAILY  ?  ? Cardiovascular:  Angiotensin Receptor Blockers Failed - 09/27/2021  9:55 AM  ?  ?  Failed - Cr in normal range and within 180 days  ?  Creatinine, Ser  ?Date Value Ref Range Status  ?08/09/2021 1.29 (H) 0.76 - 1.27 mg/dL Final  ?   ?  ?  Failed - Last BP in normal range  ?  BP Readings from Last 1 Encounters:  ?09/25/21 (!) 141/86  ?   ?  ?  Passed - K in normal range and within 180 days  ?  Potassium  ?Date Value Ref Range Status  ?08/09/2021 4.3 3.5 - 5.2 mmol/L Final  ?   ?  ?  Passed - Patient is not pregnant  ?  ?  Passed - Valid encounter within last 6 months  ?  Recent Outpatient Visits   ?      ? 4 weeks ago Essential hypertension  ? [place], MD  ? 1 month ago Essential hypertension  ? [place], RPH-CPP  ? 2 months ago Essential hypertension  ? [place], RPH-CPP  ? 3 months ago Essential hypertension  ? [place], RPH-CPP  ? 4 months ago Screening for viral disease  ? [place] Charlott Rakes, MD  ?  ?  ?Future Appointments   ?        ? In 2 months Charlott Rakes, MD [place]  ?  ? ?  ?  ?  ? ? ?

## 2021-09-28 ENCOUNTER — Telehealth: Payer: Self-pay | Admitting: Family Medicine

## 2021-09-28 DIAGNOSIS — I10 Essential (primary) hypertension: Secondary | ICD-10-CM

## 2021-09-30 NOTE — Telephone Encounter (Signed)
Has newer rx. 08/09/21 #90 with 0 RF ?Requested Prescriptions  ?Pending Prescriptions Disp Refills  ?? losartan-hydrochlorothiazide (HYZAAR) 100-25 MG tablet [Pharmacy Med Name: LOSARTAN/HCTZ 100/25MG TABLETS] 90 tablet 0  ?  Sig: TAKE 1 TABLET BY MOUTH DAILY  ?  ? Cardiovascular: ARB + Diuretic Combos Failed - 09/28/2021  6:22 PM  ?  ?  Failed - Cr in normal range and within 180 days  ?  Creatinine, Ser  ?Date Value Ref Range Status  ?08/09/2021 1.29 (H) 0.76 - 1.27 mg/dL Final  ?   ?  ?  Failed - Last BP in normal range  ?  BP Readings from Last 1 Encounters:  ?09/25/21 (!) 141/86  ?   ?  ?  Passed - K in normal range and within 180 days  ?  Potassium  ?Date Value Ref Range Status  ?08/09/2021 4.3 3.5 - 5.2 mmol/L Final  ?   ?  ?  Passed - Na in normal range and within 180 days  ?  Sodium  ?Date Value Ref Range Status  ?08/09/2021 143 134 - 144 mmol/L Final  ?   ?  ?  Passed - eGFR is 10 or above and within 180 days  ?  GFR calc Af Amer  ?Date Value Ref Range Status  ?08/16/2020 75 >59 mL/min/1.73 Final  ?  Comment:  ?  **In accordance with recommendations from the NKF-ASN Task force,** ?  [facility] is in the process of updating its eGFR calculation to the ?  2021 CKD-EPI creatinine equation that estimates kidney function ?  without a race variable. ?  ? ?GFR calc non Af Amer  ?Date Value Ref Range Status  ?08/16/2020 65 >59 mL/min/1.73 Final  ? ?eGFR  ?Date Value Ref Range Status  ?08/09/2021 66 >59 mL/min/1.73 Final  ?   ?  ?  Passed - Patient is not pregnant  ?  ?  Passed - Valid encounter within last 6 months  ?  Recent Outpatient Visits   ?      ? 1 month ago Essential hypertension  ? [place], MD  ? 1 month ago Essential hypertension  ? [place], RPH-CPP  ? 2 months ago Essential hypertension  ? [place], RPH-CPP  ? 4 months ago Essential hypertension  ?  [place], RPH-CPP  ? 4 months ago Screening for viral disease  ? [place] Charlott Rakes, MD  ?  ?  ?Future Appointments   ?        ? In 2 months Charlott Rakes, MD [place]  ?  ? ?  ?  ?  ? ? ?

## 2021-09-30 NOTE — Telephone Encounter (Signed)
Dantrolene denied. ?

## 2021-10-03 NOTE — Telephone Encounter (Signed)
Patient called in to inform Dr Margarita Rana that he has one tab for tomorrow of his BP medication and does not want to go through the weekend with out please advise request sent 09/30/21 call patient at Ph# [phone] ?

## 2021-10-04 NOTE — Telephone Encounter (Signed)
Mr Devan says there are three covered medications instead of dantrolene, baclofen methocarbamol and cyclobenzaprine. ?

## 2021-10-07 NOTE — Telephone Encounter (Signed)
Pt following up on refill request for ?losartan-hydrochlorothiazide (HYZAAR) 100-25 MG tablet ?Pt ran out Sat morning. ? ?[id number] - [place], [place] AT [address] ?

## 2021-10-08 NOTE — Telephone Encounter (Signed)
Received call from [facility] appeals dept asking if Mr Hausen has tried the three other medications listed below. He has not per the record and I asked Mr Tollison who did not think he had tried them. Await their decision. ?

## 2021-10-08 NOTE — Telephone Encounter (Signed)
Appeal Approval received from [facility]. No end date given. ?

## 2021-10-09 ENCOUNTER — Other Ambulatory Visit: Payer: Self-pay | Admitting: Family Medicine

## 2021-10-09 DIAGNOSIS — I10 Essential (primary) hypertension: Secondary | ICD-10-CM

## 2021-10-09 MED ORDER — LOSARTAN POTASSIUM-HCTZ 100-25 MG PO TABS: 1.0000 | ORAL_TABLET | Freq: Every day | ORAL | 1 refills | Status: DC

## 2021-10-09 NOTE — Telephone Encounter (Signed)
Refill sent. Pt called and informed. ?

## 2021-10-09 NOTE — Addendum Note (Signed)
Addended by: Daisy Blossom, [name] L on: 10/09/2021 11:16 AM ? ? Modules accepted: Orders ? ?

## 2021-12-12 ENCOUNTER — Ambulatory Visit: Payer: Medicaid Other | Attending: Family Medicine | Admitting: Family Medicine

## 2021-12-12 ENCOUNTER — Encounter: Payer: Self-pay | Admitting: Family Medicine

## 2021-12-12 ENCOUNTER — Ambulatory Visit: Payer: Medicaid Other | Admitting: Family Medicine

## 2021-12-12 DIAGNOSIS — Z8673 Personal history of transient ischemic attack (TIA), and cerebral infarction without residual deficits: Secondary | ICD-10-CM

## 2021-12-12 DIAGNOSIS — F329 Major depressive disorder, single episode, unspecified: Secondary | ICD-10-CM | POA: Diagnosis not present

## 2021-12-12 DIAGNOSIS — I10 Essential (primary) hypertension: Secondary | ICD-10-CM

## 2021-12-12 MED ORDER — CARVEDILOL 6.25 MG PO TABS: 6.2500 mg | ORAL_TABLET | Freq: Two times a day (BID) | ORAL | 1 refills | Status: DC

## 2021-12-12 MED ORDER — ROSUVASTATIN CALCIUM 20 MG PO TABS: 20.0000 mg | ORAL_TABLET | Freq: Every day | ORAL | 1 refills | Status: DC

## 2021-12-12 MED ORDER — LOSARTAN POTASSIUM-HCTZ 100-25 MG PO TABS: 1.0000 | ORAL_TABLET | Freq: Every day | ORAL | 1 refills | Status: DC

## 2021-12-12 MED ORDER — CITALOPRAM HYDROBROMIDE 10 MG PO TABS: ORAL_TABLET | ORAL | 1 refills | Status: DC
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
## 2021-12-18 ENCOUNTER — Ambulatory Visit: Payer: Medicaid Other | Admitting: Physical Medicine & Rehabilitation

## 2021-12-25 ENCOUNTER — Encounter: Payer: Self-pay | Admitting: Physical Medicine & Rehabilitation

## 2021-12-25 ENCOUNTER — Encounter: Payer: Medicaid Other | Attending: Physical Medicine & Rehabilitation | Admitting: Physical Medicine & Rehabilitation

## 2021-12-25 VITALS — BP 129/80 | HR 64

## 2021-12-25 DIAGNOSIS — G8114 Spastic hemiplegia affecting left nondominant side: Secondary | ICD-10-CM

## 2021-12-25 NOTE — Progress Notes (Signed)
Dysport Injection for spasticity using needle EMG guidance Indication:  Spastic hemiparesis of left nondominant side (HCC)   Dilution: 500 Units/62m        Total Units Injected:  500 Indication: Severe spasticity which interferes with ADL,mobility and/or  hygiene and is unresponsive to medication management and other conservative care Informed consent was obtained after describing risks and benefits of the procedure with the patient. This includes bleeding, bruising, infection, excessive weakness, or medication side effects. A REMS form is on file and signed.  left Needle: 524minjectable monopolar needle electrode  Number of units per muscle Pectoralis Major 0 units Pectoralis Minor 0 units Biceps 100 units Brachioradialis 100 units FCR 25 units FCU 25 units FDS 75 units FDP 75 units FPL 0 units Pronator Teres 100 units Pronator Quadratus 0 units Lumbricals 0 units  All injections were done after obtaining appropriate EMG activity and after negative drawback for blood. The patient tolerated the procedure well. Post procedure instructions were given.

## 2021-12-25 NOTE — Patient Instructions (Signed)
PLEASE FEEL FREE TO CALL OUR OFFICE WITH ANY PROBLEMS OR QUESTIONS ([phone])      

## 2022-01-22 ENCOUNTER — Other Ambulatory Visit: Payer: Self-pay | Admitting: Physical Medicine & Rehabilitation

## 2022-01-22 DIAGNOSIS — F329 Major depressive disorder, single episode, unspecified: Secondary | ICD-10-CM

## 2022-02-23 ENCOUNTER — Other Ambulatory Visit: Payer: Self-pay | Admitting: Family Medicine

## 2022-02-23 DIAGNOSIS — I10 Essential (primary) hypertension: Secondary | ICD-10-CM

## 2022-02-25 ENCOUNTER — Other Ambulatory Visit: Payer: Self-pay | Admitting: Family Medicine

## 2022-02-25 DIAGNOSIS — I10 Essential (primary) hypertension: Secondary | ICD-10-CM

## 2022-02-25 NOTE — Telephone Encounter (Signed)
Requested medication (s) are due for refill today: no  Requested medication (s) are on the active medication list:yes  Last refill:  12/12/21  Future visit scheduled: yes  Notes to clinic:  Unable to refill per protocol, Rx request is too soon. Last refill 518/23 for 90 days and 1 refill.     Requested Prescriptions  Pending Prescriptions Disp Refills   carvedilol (COREG) 6.25 MG tablet [Pharmacy Med Name: CARVEDILOL 6.'25MG'$  TABLETS] 180 tablet 1    Sig: TAKE 1 TABLET(6.25 MG) BY MOUTH TWICE DAILY WITH A MEAL     Cardiovascular: Beta Blockers 3 Failed - 02/23/2022  3:52 PM      Failed - Cr in normal range and within 360 days    Creatinine, Ser  Date Value Ref Range Status  08/09/2021 1.29 (H) 0.76 - 1.27 mg/dL Final         Passed - AST in normal range and within 360 days    AST  Date Value Ref Range Status  08/09/2021 27 0 - 40 IU/L Final         Passed - ALT in normal range and within 360 days    ALT  Date Value Ref Range Status  08/09/2021 36 0 - 44 IU/L Final         Passed - Last BP in normal range    BP Readings from Last 1 Encounters:  12/25/21 129/80         Passed - Last Heart Rate in normal range    Pulse Readings from Last 1 Encounters:  12/25/21 64         Passed - Valid encounter within last 6 months    Recent Outpatient Visits           2 months ago Essential hypertension   [place], Charlane Ferretti, MD   6 months ago Essential hypertension   [place], Charlane Ferretti, MD   6 months ago Essential hypertension   [place], Jarome Matin, RPH-CPP   7 months ago Essential hypertension   [place], Jarome Matin, RPH-CPP   9 months ago Essential hypertension   [place], Jarome Matin, RPH-CPP       Future Appointments             In 1 week Charlott Rakes, MD  [place]

## 2022-02-25 NOTE — Telephone Encounter (Signed)
Refilled 02/25/2022 #[id number] refills. Requested Prescriptions  Pending Prescriptions Disp Refills  . carvedilol (COREG) 6.25 MG tablet [Pharmacy Med Name: CARVEDILOL 6.'25MG'$  TABLETS] 180 tablet     Sig: TAKE 1 TABLET(6.25 MG) BY MOUTH TWICE DAILY WITH A MEAL     Cardiovascular: Beta Blockers 3 Failed - 02/25/2022 12:11 PM      Failed - Cr in normal range and within 360 days    Creatinine, Ser  Date Value Ref Range Status  08/09/2021 1.29 (H) 0.76 - 1.27 mg/dL Final         Passed - AST in normal range and within 360 days    AST  Date Value Ref Range Status  08/09/2021 27 0 - 40 IU/L Final         Passed - ALT in normal range and within 360 days    ALT  Date Value Ref Range Status  08/09/2021 36 0 - 44 IU/L Final         Passed - Last BP in normal range    BP Readings from Last 1 Encounters:  12/25/21 129/80         Passed - Last Heart Rate in normal range    Pulse Readings from Last 1 Encounters:  12/25/21 64         Passed - Valid encounter within last 6 months    Recent Outpatient Visits          2 months ago Essential hypertension   [place], Charlane Ferretti, MD   6 months ago Essential hypertension   [place], Charlane Ferretti, MD   6 months ago Essential hypertension   [place], Jarome Matin, RPH-CPP   7 months ago Essential hypertension   [place], Jarome Matin, RPH-CPP   9 months ago Essential hypertension   [place], RPH-CPP      Future Appointments            In 1 week Charlott Rakes, MD [place]

## 2022-03-10 ENCOUNTER — Ambulatory Visit: Payer: Medicaid Other | Admitting: Family Medicine

## 2022-03-25 ENCOUNTER — Telehealth: Payer: Self-pay

## 2022-03-25 NOTE — Telephone Encounter (Signed)
Patient called stating he was told by Dr. Naaman Plummer to call if the Botox helped to see if he needed to keep his appointment on 03/26/22. He stated the Botox did help him. Please advise

## 2022-03-26 ENCOUNTER — Encounter: Payer: Self-pay | Admitting: Physical Medicine & Rehabilitation

## 2022-03-26 ENCOUNTER — Encounter: Payer: Medicare Other | Attending: Physical Medicine & Rehabilitation | Admitting: Physical Medicine & Rehabilitation

## 2022-03-26 VITALS — BP 116/74 | HR 62 | Ht 72.0 in

## 2022-03-26 DIAGNOSIS — F329 Major depressive disorder, single episode, unspecified: Secondary | ICD-10-CM | POA: Diagnosis not present

## 2022-03-26 DIAGNOSIS — G8114 Spastic hemiplegia affecting left nondominant side: Secondary | ICD-10-CM | POA: Insufficient documentation

## 2022-03-26 NOTE — Patient Instructions (Signed)
PLEASE BE OBSERVANT FOR ANY SIGNS OF INCREASED ANXIETY OR DEPRESSION!!
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
## 2022-04-19 ENCOUNTER — Other Ambulatory Visit: Payer: Self-pay | Admitting: Physical Medicine & Rehabilitation

## 2022-04-19 DIAGNOSIS — F329 Major depressive disorder, single episode, unspecified: Secondary | ICD-10-CM

## 2022-05-10 ENCOUNTER — Other Ambulatory Visit: Payer: Self-pay | Admitting: Family Medicine

## 2022-05-10 DIAGNOSIS — Z8673 Personal history of transient ischemic attack (TIA), and cerebral infarction without residual deficits: Secondary | ICD-10-CM

## 2022-06-21 ENCOUNTER — Other Ambulatory Visit: Payer: Self-pay | Admitting: Family Medicine

## 2022-06-21 DIAGNOSIS — I10 Essential (primary) hypertension: Secondary | ICD-10-CM

## 2022-06-24 NOTE — Telephone Encounter (Signed)
Requested medication (s) are due for refill today: yes  Requested medication (s) are on the active medication list: yes    Last refill: 12/12/21  #90  1 refill  Future visit scheduled yes 06/25/22  Notes to clinic:Pt has appt tomorrow. Please review. Thank you.  Requested Prescriptions  Pending Prescriptions Disp Refills   losartan-hydrochlorothiazide (HYZAAR) 100-25 MG tablet [Pharmacy Med Name: LOSARTAN/HCTZ 100/25MG TABLETS] 90 tablet 1    Sig: Take 1 tablet by mouth daily.     Cardiovascular: ARB + Diuretic Combos Failed - 06/21/2022 11:59 AM      Failed - K in normal range and within 180 days    Potassium  Date Value Ref Range Status  08/09/2021 4.3 3.5 - 5.2 mmol/L Final         Failed - Na in normal range and within 180 days    Sodium  Date Value Ref Range Status  08/09/2021 143 134 - 144 mmol/L Final         Failed - Cr in normal range and within 180 days    Creatinine, Ser  Date Value Ref Range Status  08/09/2021 1.29 (H) 0.76 - 1.27 mg/dL Final         Failed - eGFR is 10 or above and within 180 days    GFR calc Af Amer  Date Value Ref Range Status  08/16/2020 75 >59 mL/min/1.73 Final    Comment:    **In accordance with recommendations from the NKF-ASN Task force,**   [facility] is in the process of updating its eGFR calculation to the   2021 CKD-EPI creatinine equation that estimates kidney function   without a race variable.    GFR calc non Af Amer  Date Value Ref Range Status  08/16/2020 65 >59 mL/min/1.73 Final   eGFR  Date Value Ref Range Status  08/09/2021 66 >59 mL/min/1.73 Final         Failed - Valid encounter within last 6 months    Recent Outpatient Visits           6 months ago Essential hypertension   [place], Charlane Ferretti, MD   9 months ago Essential hypertension   [place], [place], MD   10 months ago Essential hypertension   [place], Jarome Matin, RPH-CPP   11 months ago Essential hypertension   [place], Stephen L, RPH-CPP   1 year ago Essential hypertension   [place], RPH-CPP       Future Appointments             Tomorrow Charlott Rakes, MD [place] - Patient is not pregnant      Passed - Last BP in normal range    BP Readings from Last 1 Encounters:  03/26/22 116/74

## 2022-06-25 ENCOUNTER — Ambulatory Visit: Payer: Medicare Other | Attending: Family Medicine | Admitting: Family Medicine

## 2022-06-25 ENCOUNTER — Encounter: Payer: Self-pay | Admitting: Family Medicine

## 2022-06-25 VITALS — BP 149/95 | HR 76

## 2022-06-25 DIAGNOSIS — Z79899 Other long term (current) drug therapy: Secondary | ICD-10-CM | POA: Diagnosis not present

## 2022-06-25 DIAGNOSIS — G4709 Other insomnia: Secondary | ICD-10-CM

## 2022-06-25 DIAGNOSIS — N182 Chronic kidney disease, stage 2 (mild): Secondary | ICD-10-CM | POA: Insufficient documentation

## 2022-06-25 DIAGNOSIS — Z8673 Personal history of transient ischemic attack (TIA), and cerebral infarction without residual deficits: Secondary | ICD-10-CM | POA: Diagnosis not present

## 2022-06-25 DIAGNOSIS — I10 Essential (primary) hypertension: Secondary | ICD-10-CM | POA: Diagnosis not present

## 2022-06-25 DIAGNOSIS — I509 Heart failure, unspecified: Secondary | ICD-10-CM | POA: Diagnosis not present

## 2022-06-25 DIAGNOSIS — I130 Hypertensive heart and chronic kidney disease with heart failure and stage 1 through stage 4 chronic kidney disease, or unspecified chronic kidney disease: Secondary | ICD-10-CM | POA: Diagnosis not present

## 2022-06-25 DIAGNOSIS — I69354 Hemiplegia and hemiparesis following cerebral infarction affecting left non-dominant side: Secondary | ICD-10-CM | POA: Insufficient documentation

## 2022-06-25 DIAGNOSIS — E785 Hyperlipidemia, unspecified: Secondary | ICD-10-CM | POA: Diagnosis not present

## 2022-06-25 DIAGNOSIS — R7303 Prediabetes: Secondary | ICD-10-CM | POA: Insufficient documentation

## 2022-06-25 DIAGNOSIS — G4700 Insomnia, unspecified: Secondary | ICD-10-CM | POA: Diagnosis not present

## 2022-06-25 LAB — POCT GLYCOSYLATED HEMOGLOBIN (HGB A1C): Hemoglobin A1C: 5.4 % (ref 4.0–5.6)

## 2022-06-25 MED ORDER — CARVEDILOL 6.25 MG PO TABS: ORAL_TABLET | ORAL | 1 refills | Status: DC

## 2022-06-25 MED ORDER — ROSUVASTATIN CALCIUM 20 MG PO TABS: ORAL_TABLET | ORAL | 1 refills | Status: DC

## 2022-06-25 MED ORDER — HYDROXYZINE HCL 25 MG PO TABS: 25.0000 mg | ORAL_TABLET | Freq: Every evening | ORAL | 6 refills | Status: DC | PRN

## 2022-06-25 MED ORDER — LOSARTAN POTASSIUM-HCTZ 100-25 MG PO TABS: 1.0000 | ORAL_TABLET | Freq: Every day | ORAL | 1 refills | Status: DC

## 2022-06-25 NOTE — Patient Instructions (Signed)
Managing Your Hypertension Hypertension, also called high blood pressure, is when the force of the blood pressing against the walls of the arteries is too strong. Arteries are blood vessels that carry blood from your heart throughout your body. Hypertension forces the heart to work harder to pump blood and may cause the arteries to become narrow or stiff. Understanding blood pressure readings A blood pressure reading includes a higher number over a lower number: The first, or top, number is called the systolic pressure. It is a measure of the pressure in your arteries as your heart beats. The second, or bottom number, is called the diastolic pressure. It is a measure of the pressure in your arteries as the heart relaxes. For most people, a normal blood pressure is below 120/80. Your personal target blood pressure may vary depending on your medical conditions, your age, and other factors. Blood pressure is classified into four stages. Based on your blood pressure reading, your health care provider may use the following stages to determine what type of treatment you need, if any. Systolic pressure and diastolic pressure are measured in a unit called millimeters of mercury (mmHg). Normal Systolic pressure: below 120. Diastolic pressure: below 80. Elevated Systolic pressure: 120-129. Diastolic pressure: below 80. Hypertension stage 1 Systolic pressure: 130-139. Diastolic pressure: 80-89. Hypertension stage 2 Systolic pressure: 140 or above. Diastolic pressure: 90 or above. How can this condition affect me? Managing your hypertension is very important. Over time, hypertension can damage the arteries and decrease blood flow to parts of the body, including the brain, heart, and kidneys. Having untreated or uncontrolled hypertension can lead to: A heart attack. A stroke. A weakened blood vessel (aneurysm). Heart failure. Kidney damage. Eye damage. Memory and concentration problems. Vascular  dementia. What actions can I take to manage this condition? Hypertension can be managed by making lifestyle changes and possibly by taking medicines. Your health care provider will help you make a plan to bring your blood pressure within a normal range. You may be referred for counseling on a healthy diet and physical activity. Nutrition  Eat a diet that is high in fiber and potassium, and low in salt (sodium), added sugar, and fat. An example eating plan is called the DASH diet. DASH stands for Dietary Approaches to Stop Hypertension. To eat this way: Eat plenty of fresh fruits and vegetables. Try to fill one-half of your plate at each meal with fruits and vegetables. Eat whole grains, such as whole-wheat pasta, brown rice, or whole-grain bread. Fill about one-fourth of your plate with whole grains. Eat low-fat dairy products. Avoid fatty cuts of meat, processed or cured meats, and poultry with skin. Fill about one-fourth of your plate with lean proteins such as fish, chicken without skin, beans, eggs, and tofu. Avoid pre-made and processed foods. These tend to be higher in sodium, added sugar, and fat. Reduce your daily sodium intake. Many people with hypertension should eat less than 1,500 mg of sodium a day. Lifestyle  Work with your health care provider to maintain a healthy body weight or to lose weight. Ask what an ideal weight is for you. Get at least 30 minutes of exercise that causes your heart to beat faster (aerobic exercise) most days of the week. Activities may include walking, swimming, or biking. Include exercise to strengthen your muscles (resistance exercise), such as weight lifting, as part of your weekly exercise routine. Try to do these types of exercises for 30 minutes at least 3 days a week. Do   not use any products that contain nicotine or tobacco. These products include cigarettes, chewing tobacco, and vaping devices, such as e-cigarettes. If you need help quitting, ask your  health care provider. Control any long-term (chronic) conditions you have, such as high cholesterol or diabetes. Identify your sources of stress and find ways to manage stress. This may include meditation, deep breathing, or making time for fun activities. Alcohol use Do not drink alcohol if: Your health care provider tells you not to drink. You are pregnant, may be pregnant, or are planning to become pregnant. If you drink alcohol: Limit how much you have to: 0-1 drink a day for women. 0-2 drinks a day for men. Know how much alcohol is in your drink. In the [place] one drink equals one 12 oz bottle of beer (355 mL), one 5 oz glass of wine (148 mL), or one 1 oz glass of hard liquor (44 mL). Medicines Your health care provider may prescribe medicine if lifestyle changes are not enough to get your blood pressure under control and if: Your systolic blood pressure is 130 or higher. Your diastolic blood pressure is 80 or higher. Take medicines only as told by your health care provider. Follow the directions carefully. Blood pressure medicines must be taken as told by your health care provider. The medicine does not work as well when you skip doses. Skipping doses also puts you at risk for problems. Monitoring Before you monitor your blood pressure: Do not smoke, drink caffeinated beverages, or exercise within 30 minutes before taking a measurement. Use the bathroom and empty your bladder (urinate). Sit quietly for at least 5 minutes before taking measurements. Monitor your blood pressure at home as told by your health care provider. To do this: Sit with your back straight and supported. Place your feet flat on the floor. Do not cross your legs. Support your arm on a flat surface, such as a table. Make sure your upper arm is at heart level. Each time you measure, take two or three readings one minute apart and record the results. You may also need to have your blood pressure checked regularly by  your health care provider. General information Talk with your health care provider about your diet, exercise habits, and other lifestyle factors that may be contributing to hypertension. Review all the medicines you take with your health care provider because there may be side effects or interactions. Keep all follow-up visits. Your health care provider can help you create and adjust your plan for managing your high blood pressure. Where to find more information National Heart, Lung, and Blood Institute: www.nhlbi.nih.gov American Heart Association: www.heart.org Contact a health care provider if: You think you are having a reaction to medicines you have taken. You have repeated (recurrent) headaches. You feel dizzy. You have swelling in your ankles. You have trouble with your vision. Get help right away if: You develop a severe headache or confusion. You have unusual weakness or numbness, or you feel faint. You have severe pain in your chest or abdomen. You vomit repeatedly. You have trouble breathing. These symptoms may be an emergency. Get help right away. Call 911. Do not wait to see if the symptoms will go away. Do not drive yourself to the hospital. Summary Hypertension is when the force of blood pumping through your arteries is too strong. If this condition is not controlled, it may put you at risk for serious complications. Your personal target blood pressure may vary depending on your medical conditions,   your age, and other factors. For most people, a normal blood pressure is less than 120/80. Hypertension is managed by lifestyle changes, medicines, or both. Lifestyle changes to help manage hypertension include losing weight, eating a healthy, low-sodium diet, exercising more, stopping smoking, and limiting alcohol. This information is not intended to replace advice given to you by your health care provider. Make sure you discuss any questions you have with your health care  provider. Document Revised: 03/28/2021 Document Reviewed: 03/28/2021 Elsevier Patient Education  2023 [employer]  
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
## 2022-06-26 ENCOUNTER — Other Ambulatory Visit: Payer: Self-pay | Admitting: Family Medicine

## 2022-06-26 DIAGNOSIS — N179 Acute kidney failure, unspecified: Secondary | ICD-10-CM

## 2022-06-26 LAB — CMP14+EGFR
ALT: 27 IU/L (ref 0–44)
AST: 21 IU/L (ref 0–40)
Albumin/Globulin Ratio: 1.7 (ref 1.2–2.2)
Albumin: 4.7 g/dL (ref 3.8–4.9)
Alkaline Phosphatase: 53 IU/L (ref 44–121)
BUN/Creatinine Ratio: 15 (ref 9–20)
BUN: 25 mg/dL — ABNORMAL HIGH (ref 6–24)
Bilirubin Total: 0.3 mg/dL (ref 0.0–1.2)
CO2: 25 mmol/L (ref 20–29)
Calcium: 9.8 mg/dL (ref 8.7–10.2)
Chloride: 100 mmol/L (ref 96–106)
Creatinine, Ser: 1.62 mg/dL — ABNORMAL HIGH (ref 0.76–1.27)
Globulin, Total: 2.8 g/dL (ref 1.5–4.5)
Glucose: 108 mg/dL — ABNORMAL HIGH (ref 70–99)
Potassium: 4 mmol/L (ref 3.5–5.2)
Sodium: 139 mmol/L (ref 134–144)
Total Protein: 7.5 g/dL (ref 6.0–8.5)
eGFR: 50 mL/min/{1.73_m2} — ABNORMAL LOW (ref >59)

## 2022-07-02 ENCOUNTER — Encounter: Payer: Medicare Other | Attending: Physical Medicine & Rehabilitation | Admitting: Physical Medicine & Rehabilitation

## 2022-07-02 ENCOUNTER — Telehealth: Payer: Self-pay | Admitting: Family Medicine

## 2022-07-02 ENCOUNTER — Encounter: Payer: Self-pay | Admitting: Physical Medicine & Rehabilitation

## 2022-07-02 VITALS — BP 122/76 | HR 63 | Ht 72.0 in

## 2022-07-02 DIAGNOSIS — G8114 Spastic hemiplegia affecting left nondominant side: Secondary | ICD-10-CM | POA: Insufficient documentation

## 2022-07-02 MED ORDER — BUTALBITAL-APAP-CAFFEINE 50-325-40 MG PO TABS: 1.0000 | ORAL_TABLET | Freq: Two times a day (BID) | ORAL | 1 refills | Status: DC | PRN

## 2022-07-02 MED ORDER — SODIUM CHLORIDE (PF) 0.9 % IJ SOLN
5.0000 mL | Freq: Once | INTRAMUSCULAR | Status: AC
  Administered 2022-07-02: 5 mL via INTRAVENOUS

## 2022-07-02 MED ORDER — ONABOTULINUMTOXINA 100 UNITS IJ SOLR
500.0000 [IU] | Freq: Once | INTRAMUSCULAR | Status: AC
  Administered 2022-07-02: 500 [IU] via INTRAMUSCULAR

## 2022-07-02 NOTE — Progress Notes (Signed)
Botox Injection for spasticity of upper extremity using needle EMG guidance Indication: Spastic hemiparesis of left nondominant side (HCC) - Plan: botulinum toxin Type A (BOTOX) injection 500 Units, sodium chloride (PF) 0.9 % injection 5 mL, Ambulatory referral to Physical Therapy, Ambulatory referral to Occupational Therapy  G81.14 Dilution: 100 Units/ml        Total Units Injected: 500 Indication: Severe spasticity which interferes with ADL,mobility and/or  hygiene and is unresponsive to medication management and other conservative care Informed consent was obtained after describing risks and benefits of the procedure with the patient. This includes bleeding, bruising, infection, excessive weakness, or medication side effects. A REMS form is on file and signed.  Needle: 15m injectable monopolar needle electrode    Number of units per muscle Pectoralis Major 0 units Pectoralis Minor 0 units Biceps 100 units Brachioradialis 50 units FCR 25 units FCU 25 units FDS 100 units FDP 100 units FPL 0 units Palmaris Longus 50 units Pronator Teres 50 units Pronator Quadratus 0 units Lumbricals 0 units All injections were done after obtaining appropriate EMG activity and after negative drawback for blood. The patient tolerated the procedure well. Post procedure instructions were given. Return in about 3 months (around 10/01/2022) for spasticity reassessment. .Marland Kitchen

## 2022-07-02 NOTE — Telephone Encounter (Signed)
Done

## 2022-07-02 NOTE — Telephone Encounter (Signed)
Dr. Margarita Rana -   I can only get this rx to print as a hard copy. Are you able to send this to his requested pharmacy?

## 2022-07-02 NOTE — Patient Instructions (Signed)
ALWAYS FEEL FREE TO CALL OUR OFFICE WITH ANY PROBLEMS OR QUESTIONS ([phone])  **PLEASE NOTE** ALL MEDICATION REFILL REQUESTS (INCLUDING CONTROLLED SUBSTANCES) NEED TO BE MADE AT LEAST 7 DAYS PRIOR TO REFILL BEING DUE. ANY REFILL REQUESTS INSIDE THAT TIME FRAME MAY RESULT IN DELAYS IN RECEIVING YOUR PRESCRIPTION.                    

## 2022-07-02 NOTE — Addendum Note (Signed)
Addended by: Charlott Rakes on: 07/02/2022 05:39 PM   Modules accepted: Orders

## 2022-07-02 NOTE — Telephone Encounter (Signed)
Copied from [place] [id number]. Topic: General - Other >> Jul 02, 2022  1:57 PM Everette C wrote: Reason for CRM: Medication Refill - Medication: butalbital-acetaminophen-caffeine (FIORICET) 50-325-40 MG tablet [[id number]]    Has the patient contacted their pharmacy? Yes.   (Agent: If no, request that the patient contact the pharmacy for the refill. If patient does not wish to contact the pharmacy document the reason why and proceed with request.) (Agent: If yes, when and what did the pharmacy advise?)  Preferred Pharmacy (with phone number or street name): [facility] [place], [place] - [address] [place] [address] Phone: [phone] Fax: [phone] Hours: Not open 24 hours   Has the patient been seen for an appointment in the last year OR does the patient have an upcoming appointment? Yes.    Agent: Please be advised that RX refills may take up to 3 business days. We ask that you follow-up with your pharmacy.

## 2022-07-14 DIAGNOSIS — G8114 Spastic hemiplegia affecting left nondominant side: Secondary | ICD-10-CM | POA: Diagnosis not present

## 2022-07-14 DIAGNOSIS — M24532 Contracture, left wrist: Secondary | ICD-10-CM | POA: Diagnosis not present

## 2022-07-17 ENCOUNTER — Other Ambulatory Visit: Payer: Self-pay | Admitting: Physical Medicine & Rehabilitation

## 2022-07-17 DIAGNOSIS — F329 Major depressive disorder, single episode, unspecified: Secondary | ICD-10-CM

## 2022-08-01 DIAGNOSIS — G8114 Spastic hemiplegia affecting left nondominant side: Secondary | ICD-10-CM | POA: Diagnosis not present

## 2022-08-01 DIAGNOSIS — M24532 Contracture, left wrist: Secondary | ICD-10-CM | POA: Diagnosis not present

## 2022-08-25 ENCOUNTER — Ambulatory Visit: Payer: Medicare Other | Attending: Family Medicine

## 2022-08-25 DIAGNOSIS — N179 Acute kidney failure, unspecified: Secondary | ICD-10-CM | POA: Diagnosis not present

## 2022-08-26 ENCOUNTER — Telehealth: Payer: Self-pay | Admitting: Family Medicine

## 2022-08-26 LAB — BASIC METABOLIC PANEL
BUN/Creatinine Ratio: 12 (ref 9–20)
BUN: 19 mg/dL (ref 6–24)
CO2: 24 mmol/L (ref 20–29)
Calcium: 9.8 mg/dL (ref 8.7–10.2)
Chloride: 98 mmol/L (ref 96–106)
Creatinine, Ser: 1.56 mg/dL — ABNORMAL HIGH (ref 0.76–1.27)
Glucose: 102 mg/dL — ABNORMAL HIGH (ref 70–99)
Potassium: 4.8 mmol/L (ref 3.5–5.2)
Sodium: 138 mmol/L (ref 134–144)
eGFR: 52 mL/min/{1.73_m2} — ABNORMAL LOW (ref >59)
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
## 2022-09-01 NOTE — Telephone Encounter (Addendum)
Patient  called  to inform that  requested letter is ready unable to reach unable to leave message on VM.

## 2022-09-01 NOTE — Telephone Encounter (Signed)
Letter is ready.

## 2022-09-01 NOTE — Telephone Encounter (Signed)
Patient called back checking status of his request from 01/30 about ax advisor has states that he needs to obtain a letter from Dr stating that his  was disabled from time of stroke. Pls fu if there is anything else he needs to do. States was told should not need a form just a statement on letterhead.

## 2022-09-04 NOTE — Telephone Encounter (Signed)
Patient letter pick up on 09/03/2022 at around 2:30 pm.

## 2022-09-10 DIAGNOSIS — G8114 Spastic hemiplegia affecting left nondominant side: Secondary | ICD-10-CM | POA: Diagnosis not present

## 2022-09-10 DIAGNOSIS — M6281 Muscle weakness (generalized): Secondary | ICD-10-CM | POA: Diagnosis not present

## 2022-09-10 DIAGNOSIS — R2681 Unsteadiness on feet: Secondary | ICD-10-CM | POA: Diagnosis not present

## 2022-09-11 ENCOUNTER — Other Ambulatory Visit: Payer: Self-pay | Admitting: Family Medicine

## 2022-09-11 DIAGNOSIS — I10 Essential (primary) hypertension: Secondary | ICD-10-CM

## 2022-09-17 DIAGNOSIS — M6281 Muscle weakness (generalized): Secondary | ICD-10-CM | POA: Diagnosis not present

## 2022-09-17 DIAGNOSIS — G8114 Spastic hemiplegia affecting left nondominant side: Secondary | ICD-10-CM | POA: Diagnosis not present

## 2022-09-17 DIAGNOSIS — R2681 Unsteadiness on feet: Secondary | ICD-10-CM | POA: Diagnosis not present

## 2022-09-18 ENCOUNTER — Other Ambulatory Visit: Payer: Self-pay | Admitting: Family Medicine

## 2022-09-18 DIAGNOSIS — I10 Essential (primary) hypertension: Secondary | ICD-10-CM

## 2022-09-18 NOTE — Telephone Encounter (Signed)
Refilled 06/15/2022 6 month supply. Requested Prescriptions  Refused Prescriptions Disp Refills   losartan-hydrochlorothiazide (HYZAAR) 100-25 MG tablet [Pharmacy Med Name: LOSARTAN/HCTZ 100/25MG TABLETS] 90 tablet 1    Sig: TAKE 1 TABLET BY MOUTH DAILY     Cardiovascular: ARB + Diuretic Combos Failed - 09/18/2022 11:17 AM      Failed - Cr in normal range and within 180 days    Creatinine, Ser  Date Value Ref Range Status  08/25/2022 1.56 (H) 0.76 - 1.27 mg/dL Final         Passed - K in normal range and within 180 days    Potassium  Date Value Ref Range Status  08/25/2022 4.8 3.5 - 5.2 mmol/L Final         Passed - Na in normal range and within 180 days    Sodium  Date Value Ref Range Status  08/25/2022 138 134 - 144 mmol/L Final         Passed - eGFR is 10 or above and within 180 days    GFR calc Af Amer  Date Value Ref Range Status  08/16/2020 75 >59 mL/min/1.73 Final    Comment:    **In accordance with recommendations from the NKF-ASN Task force,**   [facility] is in the process of updating its eGFR calculation to the   2021 CKD-EPI creatinine equation that estimates kidney function   without a race variable.    GFR calc non Af Amer  Date Value Ref Range Status  08/16/2020 65 >59 mL/min/1.73 Final   eGFR  Date Value Ref Range Status  08/25/2022 52 (L) >59 mL/min/1.73 Final         Passed - Patient is not pregnant      Passed - Last BP in normal range    BP Readings from Last 1 Encounters:  07/02/22 122/76         Passed - Valid encounter within last 6 months    Recent Outpatient Visits           2 months ago Prediabetes   [place], MD   9 months ago Essential hypertension   [place], [place]obong, MD   1 year ago Essential hypertension   [place], Enobong, MD   1 year ago Essential hypertension   [place], Jarome Matin, RPH-CPP   1 year ago Essential hypertension   [place], RPH-CPP       Future Appointments             In 3 months Charlott Rakes, MD [place]

## 2022-09-24 DIAGNOSIS — G8114 Spastic hemiplegia affecting left nondominant side: Secondary | ICD-10-CM | POA: Diagnosis not present

## 2022-09-24 DIAGNOSIS — R2681 Unsteadiness on feet: Secondary | ICD-10-CM | POA: Diagnosis not present

## 2022-09-24 DIAGNOSIS — M6281 Muscle weakness (generalized): Secondary | ICD-10-CM | POA: Diagnosis not present

## 2022-10-01 ENCOUNTER — Encounter: Payer: Self-pay | Admitting: Physical Medicine & Rehabilitation

## 2022-10-01 ENCOUNTER — Encounter: Payer: Medicare Other | Attending: Physical Medicine & Rehabilitation | Admitting: Physical Medicine & Rehabilitation

## 2022-10-01 VITALS — BP 106/67 | HR 62 | Ht 72.0 in | Wt 185.0 lb

## 2022-10-01 DIAGNOSIS — R2681 Unsteadiness on feet: Secondary | ICD-10-CM | POA: Diagnosis not present

## 2022-10-01 DIAGNOSIS — I610 Nontraumatic intracerebral hemorrhage in hemisphere, subcortical: Secondary | ICD-10-CM | POA: Insufficient documentation

## 2022-10-01 DIAGNOSIS — G8114 Spastic hemiplegia affecting left nondominant side: Secondary | ICD-10-CM | POA: Insufficient documentation

## 2022-10-01 DIAGNOSIS — R2689 Other abnormalities of gait and mobility: Secondary | ICD-10-CM | POA: Diagnosis not present

## 2022-10-01 DIAGNOSIS — M6281 Muscle weakness (generalized): Secondary | ICD-10-CM | POA: Diagnosis not present
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
## 2022-10-01 NOTE — Patient Instructions (Signed)
ALWAYS FEEL FREE TO CALL OUR OFFICE WITH ANY PROBLEMS OR QUESTIONS [phone]  **PLEASE NOTE** ALL MEDICATION REFILL REQUESTS (INCLUDING CONTROLLED SUBSTANCES) NEED TO BE MADE AT LEAST 7 DAYS PRIOR TO REFILL BEING DUE. ANY REFILL REQUESTS INSIDE THAT TIME FRAME MAY RESULT IN DELAYS IN RECEIVING YOUR PRESCRIPTION.                    TAKE DANTROLENE THREE X DAILY!

## 2022-10-08 DIAGNOSIS — R2681 Unsteadiness on feet: Secondary | ICD-10-CM | POA: Diagnosis not present

## 2022-10-08 DIAGNOSIS — G8114 Spastic hemiplegia affecting left nondominant side: Secondary | ICD-10-CM | POA: Diagnosis not present

## 2022-10-08 DIAGNOSIS — M6281 Muscle weakness (generalized): Secondary | ICD-10-CM | POA: Diagnosis not present

## 2022-10-08 DIAGNOSIS — R2689 Other abnormalities of gait and mobility: Secondary | ICD-10-CM | POA: Diagnosis not present

## 2022-10-15 DIAGNOSIS — R2681 Unsteadiness on feet: Secondary | ICD-10-CM | POA: Diagnosis not present

## 2022-10-15 DIAGNOSIS — M6281 Muscle weakness (generalized): Secondary | ICD-10-CM | POA: Diagnosis not present

## 2022-10-15 DIAGNOSIS — R2689 Other abnormalities of gait and mobility: Secondary | ICD-10-CM | POA: Diagnosis not present

## 2022-10-15 DIAGNOSIS — G8114 Spastic hemiplegia affecting left nondominant side: Secondary | ICD-10-CM | POA: Diagnosis not present

## 2022-10-21 ENCOUNTER — Other Ambulatory Visit: Payer: Self-pay | Admitting: Physical Medicine & Rehabilitation

## 2022-10-21 DIAGNOSIS — F329 Major depressive disorder, single episode, unspecified: Secondary | ICD-10-CM

## 2022-10-29 DIAGNOSIS — R2689 Other abnormalities of gait and mobility: Secondary | ICD-10-CM | POA: Diagnosis not present

## 2022-10-29 DIAGNOSIS — M6281 Muscle weakness (generalized): Secondary | ICD-10-CM | POA: Diagnosis not present

## 2022-10-29 DIAGNOSIS — R2681 Unsteadiness on feet: Secondary | ICD-10-CM | POA: Diagnosis not present

## 2022-10-29 DIAGNOSIS — G8114 Spastic hemiplegia affecting left nondominant side: Secondary | ICD-10-CM | POA: Diagnosis not present

## 2022-11-10 DIAGNOSIS — G8114 Spastic hemiplegia affecting left nondominant side: Secondary | ICD-10-CM | POA: Diagnosis not present

## 2022-11-10 DIAGNOSIS — R2681 Unsteadiness on feet: Secondary | ICD-10-CM | POA: Diagnosis not present

## 2022-11-10 DIAGNOSIS — R2689 Other abnormalities of gait and mobility: Secondary | ICD-10-CM | POA: Diagnosis not present

## 2022-11-10 DIAGNOSIS — M6281 Muscle weakness (generalized): Secondary | ICD-10-CM | POA: Diagnosis not present

## 2022-11-19 ENCOUNTER — Encounter: Payer: Medicare Other | Attending: Physical Medicine & Rehabilitation | Admitting: Physical Medicine & Rehabilitation

## 2022-11-19 ENCOUNTER — Encounter: Payer: Self-pay | Admitting: Physical Medicine & Rehabilitation

## 2022-11-19 VITALS — BP 112/75 | HR 66 | Ht 72.0 in | Wt 185.0 lb

## 2022-11-19 DIAGNOSIS — G8114 Spastic hemiplegia affecting left nondominant side: Secondary | ICD-10-CM

## 2022-11-19 DIAGNOSIS — R2689 Other abnormalities of gait and mobility: Secondary | ICD-10-CM | POA: Diagnosis not present

## 2022-11-19 DIAGNOSIS — R2681 Unsteadiness on feet: Secondary | ICD-10-CM | POA: Diagnosis not present

## 2022-11-19 DIAGNOSIS — M6281 Muscle weakness (generalized): Secondary | ICD-10-CM | POA: Diagnosis not present

## 2022-11-19 MED ORDER — SODIUM CHLORIDE (PF) 0.9 % IJ SOLN
4.0000 mL | Freq: Once | INTRAMUSCULAR | Status: AC
  Administered 2022-11-19: 4 mL

## 2022-11-19 MED ORDER — ABOBOTULINUMTOXINA 300 UNITS IM SOLR
300.0000 [IU] | Freq: Once | INTRAMUSCULAR | Status: AC
  Administered 2022-11-19: 300 [IU] via INTRAMUSCULAR

## 2022-11-19 MED ORDER — ABOBOTULINUMTOXINA 500 UNITS IM SOLR
500.0000 [IU] | Freq: Once | INTRAMUSCULAR | Status: AC
  Administered 2022-11-19: 500 [IU] via INTRAMUSCULAR

## 2022-11-19 NOTE — Patient Instructions (Signed)
ALWAYS FEEL FREE TO CALL OUR OFFICE WITH ANY PROBLEMS OR QUESTIONS ([phone])  **PLEASE NOTE** ALL MEDICATION REFILL REQUESTS (INCLUDING CONTROLLED SUBSTANCES) NEED TO BE MADE AT LEAST 7 DAYS PRIOR TO REFILL BEING DUE. ANY REFILL REQUESTS INSIDE THAT TIME FRAME MAY RESULT IN DELAYS IN RECEIVING YOUR PRESCRIPTION.                    

## 2022-11-19 NOTE — Progress Notes (Signed)
Dysport Injection for spasticity using needle EMG guidance Indication:  Spastic hemiparesis of left nondominant side - Plan: abobotulinumtoxinA (DYSPORT) 300 units injection 300 Units, AbobotulinumtoxinA (DYSPORT) 500 units injection 500 Units   Dilution: 500 Units/70ml        Total Units Injected:  800 Indication: Severe spasticity which interferes with ADL,mobility and/or  hygiene and is unresponsive to medication management and other conservative care Informed consent was obtained after describing risks and benefits of the procedure with the patient. This includes bleeding, bruising, infection, excessive weakness, or medication side effects. A REMS form is on file and signed.  left Needle: 50mm injectable monopolar needle electrode  Number of units per muscle Pectoralis Major 0 units Pectoralis Minor 0 units Biceps 200 units Brachioradialis 200 units FCR 50 units FCU 50 units FDS 150 units FDP 150 units FPL 0 units Pronator Teres 0 units Pronator Quadratus 0 units Lumbricals 0 units  All injections were done after obtaining appropriate EMG activity and after negative drawback for blood. The patient tolerated the procedure well. Post procedure instructions were given.    Yahia's left AFO is wearing out. He is in need of a new brace. A new rx was provided. He's working with PT as well with this.

## 2022-11-26 DIAGNOSIS — R2681 Unsteadiness on feet: Secondary | ICD-10-CM | POA: Diagnosis not present

## 2022-11-26 DIAGNOSIS — M6281 Muscle weakness (generalized): Secondary | ICD-10-CM | POA: Diagnosis not present

## 2022-11-26 DIAGNOSIS — G8114 Spastic hemiplegia affecting left nondominant side: Secondary | ICD-10-CM | POA: Diagnosis not present

## 2022-12-16 ENCOUNTER — Other Ambulatory Visit: Payer: Self-pay | Admitting: Family Medicine

## 2022-12-16 DIAGNOSIS — Z8673 Personal history of transient ischemic attack (TIA), and cerebral infarction without residual deficits: Secondary | ICD-10-CM

## 2022-12-17 DIAGNOSIS — G8114 Spastic hemiplegia affecting left nondominant side: Secondary | ICD-10-CM | POA: Diagnosis not present

## 2022-12-17 DIAGNOSIS — M6281 Muscle weakness (generalized): Secondary | ICD-10-CM | POA: Diagnosis not present

## 2022-12-17 DIAGNOSIS — R2681 Unsteadiness on feet: Secondary | ICD-10-CM | POA: Diagnosis not present

## 2022-12-19 DIAGNOSIS — M21372 Foot drop, left foot: Secondary | ICD-10-CM | POA: Diagnosis not present

## 2022-12-24 ENCOUNTER — Ambulatory Visit: Payer: Medicare Other | Attending: Family Medicine | Admitting: Family Medicine

## 2022-12-24 VITALS — BP 114/74 | HR 65

## 2022-12-24 DIAGNOSIS — I129 Hypertensive chronic kidney disease with stage 1 through stage 4 chronic kidney disease, or unspecified chronic kidney disease: Secondary | ICD-10-CM | POA: Diagnosis not present

## 2022-12-24 DIAGNOSIS — Z8673 Personal history of transient ischemic attack (TIA), and cerebral infarction without residual deficits: Secondary | ICD-10-CM

## 2022-12-24 DIAGNOSIS — G4709 Other insomnia: Secondary | ICD-10-CM | POA: Diagnosis not present

## 2022-12-24 DIAGNOSIS — I69354 Hemiplegia and hemiparesis following cerebral infarction affecting left non-dominant side: Secondary | ICD-10-CM

## 2022-12-24 MED ORDER — LOSARTAN POTASSIUM-HCTZ 100-25 MG PO TABS: 1.0000 | ORAL_TABLET | Freq: Every day | ORAL | 1 refills | Status: DC

## 2022-12-24 MED ORDER — HYDROXYZINE HCL 25 MG PO TABS: 25.0000 mg | ORAL_TABLET | Freq: Every evening | ORAL | 6 refills | Status: DC | PRN

## 2022-12-24 MED ORDER — CARVEDILOL 6.25 MG PO TABS: 6.2500 mg | ORAL_TABLET | Freq: Two times a day (BID) | ORAL | 1 refills | Status: DC

## 2022-12-24 MED ORDER — ROSUVASTATIN CALCIUM 20 MG PO TABS: 20.0000 mg | ORAL_TABLET | Freq: Every day | ORAL | 1 refills | Status: DC
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
## 2022-12-24 NOTE — Progress Notes (Signed)
Discuss medications

## 2022-12-24 NOTE — Patient Instructions (Signed)
Rehabilitation After a Stroke Rehabilitation, also called rehab, can help you gain more independence after a stroke. It can improve your strength. It can also help with your ability to walk, speak, and swallow. Early rehab may be the most helpful. What is stroke rehab? Strokes affect people in different ways. You may need rehab in a hospital or at a skilled nursing facility. You may also receive rehab at home. Rehab may happen over a few months. Rehab teams are often led by a health care provider who is an expert in rehab after a stroke. They help to prevent and treat: Contracture. This is when your muscles and tendons tighten. It can cause your joints to become stiff. Bed sores. These are also known as pressure wounds. Deep vein thrombosis. This is when blood clots form in your veins. It often happens in your legs or thighs. Future strokes. Pain. The inability to control when you urinate or have bowel movements (incontinence). Post-stroke depression. Other rehab team members may include: Therapists. Psychologists. Social workers. Nurses. Types of rehab Physical therapy Physical therapy can help you improve your coordination, balance, and muscle strength. It may involve: Range-of-motion exercises. Exercises to strengthen the muscles used for standing, walking, and other activities. Moving between lying, sitting, and standing. Walking with a cane or walker, if needed. Using stairs.  Occupational therapy Occupational therapy can help you rebuild your skills to do daily tasks. These tasks may include brushing your teeth, going to the bathroom, eating, bathing, and getting dressed. This type of therapy may also help with: Vision. Visual scanning is a technique that is used to prevent falls. Memory and cognitive training. You may learn problem-solving skills and relearn some tasks, such as how to make a phone call. Fine muscle movements. This may include buttoning a shirt or picking up small  objects. Speech-language therapy Speech-language therapy can help you communicate. After a stroke, you may have trouble understanding what people are saying. You may also have trouble writing, speaking, or finding the right word for what you want to say. You may also need speech therapy if you have trouble swallowing when you eat or drink. This type of therapy may include: Ways to strengthen the muscles you use when you swallow. Naming objects or describing pictures. This helps retrain the brain to recognize and remember words. Exercises to strengthen the muscles used for talking. This includes your tongue and lips. Exercises to retrain your brain to understand what you read and hear. General information  Take over-the-counter and prescription medicines only as told by your health care provider. Do not use any products that contain nicotine or tobacco. These include cigarettes, chewing tobacco, and vaping devices, such as e-cigarettes. If you need help quitting, ask your health care provider. Try to involve your family and friends in your recovery. It can help to have others encourage you. Stay connected with your friends, family, and community. This can help you to not feel isolated after a stroke. Do exercises as told by your health care provider. Keep all follow-up visits to make sure all your needs are being met and to catch any new problems early. This information is not intended to replace advice given to you by your health care provider. Make sure you discuss any questions you have with your health care provider. Document Revised: 12/19/2021 Document Reviewed: 12/19/2021 Elsevier Patient Education  2024 [employer].

## 2022-12-25 LAB — CMP14+EGFR
ALT: 37 IU/L (ref 0–44)
AST: 27 IU/L (ref 0–40)
Albumin/Globulin Ratio: 1.6 (ref 1.2–2.2)
Albumin: 4.6 g/dL (ref 3.8–4.9)
Alkaline Phosphatase: 62 IU/L (ref 44–121)
BUN/Creatinine Ratio: 14 (ref 9–20)
BUN: 24 mg/dL (ref 6–24)
Bilirubin Total: 0.5 mg/dL (ref 0.0–1.2)
CO2: 24 mmol/L (ref 20–29)
Calcium: 10.1 mg/dL (ref 8.7–10.2)
Chloride: 101 mmol/L (ref 96–106)
Creatinine, Ser: 1.66 mg/dL — ABNORMAL HIGH (ref 0.76–1.27)
Globulin, Total: 2.9 g/dL (ref 1.5–4.5)
Glucose: 73 mg/dL (ref 70–99)
Potassium: 5.2 mmol/L (ref 3.5–5.2)
Sodium: 140 mmol/L (ref 134–144)
Total Protein: 7.5 g/dL (ref 6.0–8.5)
eGFR: 49 mL/min/{1.73_m2} — ABNORMAL LOW (ref >59)

## 2022-12-31 DIAGNOSIS — G8114 Spastic hemiplegia affecting left nondominant side: Secondary | ICD-10-CM | POA: Diagnosis not present

## 2022-12-31 DIAGNOSIS — R2681 Unsteadiness on feet: Secondary | ICD-10-CM | POA: Diagnosis not present

## 2022-12-31 DIAGNOSIS — M6281 Muscle weakness (generalized): Secondary | ICD-10-CM | POA: Diagnosis not present

## 2023-01-14 DIAGNOSIS — G8114 Spastic hemiplegia affecting left nondominant side: Secondary | ICD-10-CM | POA: Diagnosis not present

## 2023-01-14 DIAGNOSIS — R2681 Unsteadiness on feet: Secondary | ICD-10-CM | POA: Diagnosis not present

## 2023-01-14 DIAGNOSIS — M6281 Muscle weakness (generalized): Secondary | ICD-10-CM | POA: Diagnosis not present

## 2023-01-21 DIAGNOSIS — R2681 Unsteadiness on feet: Secondary | ICD-10-CM | POA: Diagnosis not present

## 2023-01-21 DIAGNOSIS — G8114 Spastic hemiplegia affecting left nondominant side: Secondary | ICD-10-CM | POA: Diagnosis not present

## 2023-01-21 DIAGNOSIS — M6281 Muscle weakness (generalized): Secondary | ICD-10-CM | POA: Diagnosis not present

## 2023-01-28 DIAGNOSIS — G8114 Spastic hemiplegia affecting left nondominant side: Secondary | ICD-10-CM | POA: Diagnosis not present

## 2023-02-17 ENCOUNTER — Other Ambulatory Visit: Payer: Self-pay | Admitting: Family Medicine

## 2023-02-17 DIAGNOSIS — Z8673 Personal history of transient ischemic attack (TIA), and cerebral infarction without residual deficits: Secondary | ICD-10-CM

## 2023-02-18 ENCOUNTER — Encounter: Payer: Medicare Other | Attending: Physical Medicine & Rehabilitation | Admitting: Physical Medicine & Rehabilitation

## 2023-02-18 ENCOUNTER — Encounter: Payer: Self-pay | Admitting: Physical Medicine & Rehabilitation

## 2023-02-18 DIAGNOSIS — G8114 Spastic hemiplegia affecting left nondominant side: Secondary | ICD-10-CM | POA: Diagnosis not present

## 2023-02-18 MED ORDER — DANTROLENE SODIUM 50 MG PO CAPS: 50.0000 mg | ORAL_CAPSULE | Freq: Three times a day (TID) | ORAL | 4 refills | Status: DC

## 2023-02-18 NOTE — Patient Instructions (Signed)
TAKE YOUR DANTRIUM THREE X DAILY.
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
## 2023-02-25 ENCOUNTER — Telehealth: Payer: Self-pay

## 2023-02-25 NOTE — Telephone Encounter (Signed)
Copied from CRM [id number]. Topic: General - Other >> Feb 25, 2023  9:49 AM Franchot Heidelberg wrote: Reason for CRM:   Marchelle Folks from [employer] called to report that she faxed over a document on 02/18/2023, she is refaxing today   Best contact: [phone] Pt case ID: [id number]

## 2023-02-25 NOTE — Telephone Encounter (Signed)
Per epic pt has 90 day supply sent on 02/18/2023

## 2023-03-10 ENCOUNTER — Ambulatory Visit: Payer: Medicare Other | Attending: Family Medicine

## 2023-03-10 VITALS — Ht 72.0 in | Wt 180.0 lb

## 2023-03-10 DIAGNOSIS — Z0000 Encounter for general adult medical examination without abnormal findings: Secondary | ICD-10-CM
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
## 2023-03-10 NOTE — Patient Instructions (Signed)
Paul [name] , Thank you for taking time to come for your Medicare Wellness Visit. I appreciate your ongoing commitment to your health goals. Please review the following plan we discussed and let me know if I can assist you in the future.   Referrals/Orders/Follow-Ups/Clinician Recommendations: Aim for 30 minutes of exercise or brisk walking, 6-8 glasses of water, and 5 servings of fruits and vegetables each day.  This is a list of the screening recommended for you and due dates:  Health Maintenance  Topic Date Due   Medicare Annual Wellness Visit  Never done   COVID-19 Vaccine (1 - 2023-24 season) Never done   Flu Shot  02/26/2023   Colon Cancer Screening  01/09/2029   Hepatitis C Screening  Completed   HIV Screening  Completed   Zoster (Shingles) Vaccine  Completed   HPV Vaccine  Aged Out   DTaP/Tdap/Td vaccine  Discontinued    Advanced directives: (ACP Link)Information on Advanced Care Planning can be found at [facility] Advance Health Care Directives Advance Health Care Directives ([email or web address]   Next Medicare Annual Wellness Visit scheduled for next year: Yes  Preventive Care 40-64 Years, Male Preventive care refers to lifestyle choices and visits with your health care provider that can promote health and wellness. What does preventive care include? A yearly physical exam. This is also called an annual well check. Dental exams once or twice a year. Routine eye exams. Ask your health care provider how often you should have your eyes checked. Personal lifestyle choices, including: Daily care of your teeth and gums. Regular physical activity. Eating a healthy diet. Avoiding tobacco and drug use. Limiting alcohol use. Practicing safe sex. Taking low-dose aspirin every day starting at age 83. What happens during an annual well check? The services and screenings done by your health care provider during your annual well check will depend on your age, overall  health, lifestyle risk factors, and family history of disease. Counseling  Your health care provider may ask you questions about your: Alcohol use. Tobacco use. Drug use. Emotional well-being. Home and relationship well-being. Sexual activity. Eating habits. Work and work Astronomer. Screening  You may have the following tests or measurements: Height, weight, and BMI. Blood pressure. Lipid and cholesterol levels. These may be checked every 5 years, or more frequently if you are over 15 years old. Skin check. Lung cancer screening. You may have this screening every year starting at age 36 if you have a 30-pack-year history of smoking and currently smoke or have quit within the past 15 years. Fecal occult blood test (FOBT) of the stool. You may have this test every year starting at age 72. Flexible sigmoidoscopy or colonoscopy. You may have a sigmoidoscopy every 5 years or a colonoscopy every 10 years starting at age 29. Prostate cancer screening. Recommendations will vary depending on your family history and other risks. Hepatitis C blood test. Hepatitis B blood test. Sexually transmitted disease (STD) testing. Diabetes screening. This is done by checking your blood sugar (glucose) after you have not eaten for a while (fasting). You may have this done every 1-3 years. Discuss your test results, treatment options, and if necessary, the need for more tests with your health care provider. Vaccines  Your health care provider may recommend certain vaccines, such as: Influenza vaccine. This is recommended every year. Tetanus, diphtheria, and acellular pertussis (Tdap, Td) vaccine. You may need a Td booster every 10 years. Zoster vaccine. You may need this after age  60. Pneumococcal 13-valent conjugate (PCV13) vaccine. You may need this if you have certain conditions and have not been vaccinated. Pneumococcal polysaccharide (PPSV23) vaccine. You may need one or two doses if you smoke  cigarettes or if you have certain conditions. Talk to your health care provider about which screenings and vaccines you need and how often you need them. This information is not intended to replace advice given to you by your health care provider. Make sure you discuss any questions you have with your health care provider. Document Released: 08/10/2015 Document Revised: 04/02/2016 Document Reviewed: 05/15/2015 Elsevier Interactive Patient Education  2017 [employer].  Fall Prevention in the Home Falls can cause injuries. They can happen to people of all ages. There are many things you can do to make your home safe and to help prevent falls. What can I do on the outside of my home? Regularly fix the edges of walkways and driveways and fix any cracks. Remove anything that might make you trip as you walk through a door, such as a raised step or threshold. Trim any bushes or trees on the path to your home. Use bright outdoor lighting. Clear any walking paths of anything that might make someone trip, such as rocks or tools. Regularly check to see if handrails are loose or broken. Make sure that both sides of any steps have handrails. Any raised decks and porches should have guardrails on the edges. Have any leaves, snow, or ice cleared regularly. Use sand or salt on walking paths during winter. Clean up any spills in your garage right away. This includes oil or grease spills. What can I do in the bathroom? Use night lights. Install grab bars by the toilet and in the tub and shower. Do not use towel bars as grab bars. Use non-skid mats or decals in the tub or shower. If you need to sit down in the shower, use a plastic, non-slip stool. Keep the floor dry. Clean up any water that spills on the floor as soon as it happens. Remove soap buildup in the tub or shower regularly. Attach bath mats securely with double-sided non-slip rug tape. Do not have throw rugs and other things on the floor that  can make you trip. What can I do in the bedroom? Use night lights. Make sure that you have a light by your bed that is easy to reach. Do not use any sheets or blankets that are too big for your bed. They should not hang down onto the floor. Have a firm chair that has side arms. You can use this for support while you get dressed. Do not have throw rugs and other things on the floor that can make you trip. What can I do in the kitchen? Clean up any spills right away. Avoid walking on wet floors. Keep items that you use a lot in easy-to-reach places. If you need to reach something above you, use a strong step stool that has a grab bar. Keep electrical cords out of the way. Do not use floor polish or wax that makes floors slippery. If you must use wax, use non-skid floor wax. Do not have throw rugs and other things on the floor that can make you trip. What can I do with my stairs? Do not leave any items on the stairs. Make sure that there are handrails on both sides of the stairs and use them. Fix handrails that are broken or loose. Make sure that handrails are as long as  the stairways. Check any carpeting to make sure that it is firmly attached to the stairs. Fix any carpet that is loose or worn. Avoid having throw rugs at the top or bottom of the stairs. If you do have throw rugs, attach them to the floor with carpet tape. Make sure that you have a light switch at the top of the stairs and the bottom of the stairs. If you do not have them, ask someone to add them for you. What else can I do to help prevent falls? Wear shoes that: Do not have high heels. Have rubber bottoms. Are comfortable and fit you well. Are closed at the toe. Do not wear sandals. If you use a stepladder: Make sure that it is fully opened. Do not climb a closed stepladder. Make sure that both sides of the stepladder are locked into place. Ask someone to hold it for you, if possible. Clearly Pamela and make sure that you  can see: Any grab bars or handrails. First and last steps. Where the edge of each step is. Use tools that help you move around (mobility aids) if they are needed. These include: Canes. Walkers. Scooters. Crutches. Turn on the lights when you go into a dark area. Replace any light bulbs as soon as they burn out. Set up your furniture so you have a clear path. Avoid moving your furniture around. If any of your floors are uneven, fix them. If there are any pets around you, be aware of where they are. Review your medicines with your doctor. Some medicines can make you feel dizzy. This can increase your chance of falling. Ask your doctor what other things that you can do to help prevent falls. This information is not intended to replace advice given to you by your health care provider. Make sure you discuss any questions you have with your health care provider. Document Released: 05/10/2009 Document Revised: 12/20/2015 Document Reviewed: 08/18/2014 Elsevier Interactive Patient Education  2017 [employer].

## 2023-03-11 ENCOUNTER — Other Ambulatory Visit: Payer: Self-pay | Admitting: Family Medicine

## 2023-03-11 DIAGNOSIS — I129 Hypertensive chronic kidney disease with stage 1 through stage 4 chronic kidney disease, or unspecified chronic kidney disease: Secondary | ICD-10-CM

## 2023-03-25 ENCOUNTER — Encounter: Payer: Medicare Other | Attending: Physical Medicine & Rehabilitation | Admitting: Physical Medicine & Rehabilitation

## 2023-03-25 ENCOUNTER — Encounter: Payer: Self-pay | Admitting: Physical Medicine & Rehabilitation

## 2023-03-25 VITALS — BP 118/80 | HR 69 | Ht 72.0 in | Wt 187.0 lb

## 2023-03-25 DIAGNOSIS — G8114 Spastic hemiplegia affecting left nondominant side: Secondary | ICD-10-CM

## 2023-03-25 MED ORDER — ABOBOTULINUMTOXINA 500 UNITS IM SOLR
500.0000 [IU] | Freq: Once | INTRAMUSCULAR | Status: AC
Start: 2023-03-25 — End: 2023-03-25
  Administered 2023-03-25: 500 [IU] via INTRAMUSCULAR

## 2023-03-25 MED ORDER — ABOBOTULINUMTOXINA 300 UNITS IM SOLR
300.0000 [IU] | Freq: Once | INTRAMUSCULAR | Status: AC
Start: 2023-03-25 — End: 2023-03-25
  Administered 2023-03-25: 300 [IU] via INTRAMUSCULAR

## 2023-03-25 MED ORDER — SODIUM CHLORIDE (PF) 0.9 % IJ SOLN
4.0000 mL | Freq: Once | INTRAMUSCULAR | Status: AC
Start: 2023-03-25 — End: 2023-03-25
  Administered 2023-03-25: 4 mL via INTRAVENOUS

## 2023-03-25 NOTE — Patient Instructions (Signed)
ALWAYS FEEL FREE TO CALL OUR OFFICE WITH ANY PROBLEMS OR QUESTIONS ([phone])  **PLEASE NOTE** ALL MEDICATION REFILL REQUESTS (INCLUDING CONTROLLED SUBSTANCES) NEED TO BE MADE AT LEAST 7 DAYS PRIOR TO REFILL BEING DUE. ANY REFILL REQUESTS INSIDE THAT TIME FRAME MAY RESULT IN DELAYS IN RECEIVING YOUR PRESCRIPTION.                    

## 2023-03-25 NOTE — Progress Notes (Signed)
Dysport Injection for spasticity using needle EMG guidance Indication:  Spastic hemiparesis of left nondominant side (HCC) - Plan: AbobotulinumtoxinA (DYSPORT) 500 units injection 500 Units, abobotulinumtoxinA (DYSPORT) 300 units injection 300 Units, sodium chloride (PF) 0.9 % injection 4 mL G81.14  Dilution: 500 Units/4ml        Total Units Injected:  800 Indication: Severe spasticity which interferes with ADL,mobility and/or  hygiene and is unresponsive to medication management and other conservative care Informed consent was obtained after describing risks and benefits of the procedure with the patient. This includes bleeding, bruising, infection, excessive weakness, or medication side effects. A REMS form is on file and signed.  left Needle: 50mm injectable monopolar needle electrode  Number of units per muscle Pectoralis Major 0 units Pectoralis Minor 0 units Biceps 200 units Brachioradialis 100 units FCR 50 units FCU 50 units FDS 150 units FDP 150 units FPL 0 units Pronator Teres 100 units Pronator Quadratus 0 units Lumbricals 0 units  All injections were done after obtaining appropriate EMG activity and after negative drawback for blood. The patient tolerated the procedure well. Post procedure instructions were given.

## 2023-04-17 ENCOUNTER — Other Ambulatory Visit: Payer: Self-pay | Admitting: Physical Medicine & Rehabilitation

## 2023-06-18 ENCOUNTER — Other Ambulatory Visit: Payer: Self-pay | Admitting: Family Medicine

## 2023-06-18 DIAGNOSIS — I129 Hypertensive chronic kidney disease with stage 1 through stage 4 chronic kidney disease, or unspecified chronic kidney disease: Secondary | ICD-10-CM

## 2023-06-24 ENCOUNTER — Encounter: Payer: Medicare Other | Attending: Physical Medicine & Rehabilitation | Admitting: Physical Medicine & Rehabilitation

## 2023-06-24 ENCOUNTER — Encounter: Payer: Self-pay | Admitting: Physical Medicine & Rehabilitation

## 2023-06-24 VITALS — BP 129/85 | HR 71 | Ht 72.0 in

## 2023-06-24 DIAGNOSIS — I610 Nontraumatic intracerebral hemorrhage in hemisphere, subcortical: Secondary | ICD-10-CM | POA: Insufficient documentation

## 2023-06-24 DIAGNOSIS — G8114 Spastic hemiplegia affecting left nondominant side: Secondary | ICD-10-CM | POA: Insufficient documentation

## 2023-06-24 NOTE — Patient Instructions (Signed)
ALWAYS FEEL FREE TO CALL OUR OFFICE WITH ANY PROBLEMS OR QUESTIONS ([phone])  **PLEASE NOTE** ALL MEDICATION REFILL REQUESTS (INCLUDING CONTROLLED SUBSTANCES) NEED TO BE MADE AT LEAST 7 DAYS PRIOR TO REFILL BEING DUE. ANY REFILL REQUESTS INSIDE THAT TIME FRAME MAY RESULT IN DELAYS IN RECEIVING YOUR PRESCRIPTION.                    
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
## 2023-06-30 ENCOUNTER — Ambulatory Visit: Payer: Medicare Other | Attending: Family Medicine | Admitting: Family Medicine

## 2023-06-30 ENCOUNTER — Encounter: Payer: Self-pay | Admitting: Family Medicine

## 2023-06-30 VITALS — BP 133/86 | HR 60 | Temp 98.2°F

## 2023-06-30 DIAGNOSIS — Z8673 Personal history of transient ischemic attack (TIA), and cerebral infarction without residual deficits: Secondary | ICD-10-CM | POA: Diagnosis not present

## 2023-06-30 DIAGNOSIS — I69354 Hemiplegia and hemiparesis following cerebral infarction affecting left non-dominant side: Secondary | ICD-10-CM

## 2023-06-30 DIAGNOSIS — N183 Chronic kidney disease, stage 3 unspecified: Secondary | ICD-10-CM

## 2023-06-30 DIAGNOSIS — I129 Hypertensive chronic kidney disease with stage 1 through stage 4 chronic kidney disease, or unspecified chronic kidney disease: Secondary | ICD-10-CM | POA: Diagnosis not present

## 2023-06-30 MED ORDER — LOSARTAN POTASSIUM-HCTZ 100-25 MG PO TABS: 1.0000 | ORAL_TABLET | Freq: Every day | ORAL | 1 refills | Status: DC

## 2023-06-30 MED ORDER — ROSUVASTATIN CALCIUM 20 MG PO TABS: 20.0000 mg | ORAL_TABLET | Freq: Every day | ORAL | 1 refills | Status: DC

## 2023-06-30 MED ORDER — CARVEDILOL 6.25 MG PO TABS: 6.2500 mg | ORAL_TABLET | Freq: Two times a day (BID) | ORAL | 1 refills | Status: DC
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
## 2023-06-30 NOTE — Patient Instructions (Signed)
VISIT SUMMARY:  You had a routine follow-up appointment today. We discussed your current medications, recent blood work, and your ongoing treatments. You are doing well overall, and we have made some adjustments to your treatment plan based on your recent concerns and test results.  YOUR PLAN:  -STAGE 3 CHRONIC KIDNEY DISEASE: Stage 3 Chronic Kidney Disease means that your kidneys are not functioning at full capacity. We discussed potential causes and the importance of avoiding NSAIDs, which can harm your kidneys. We will repeat your kidney function tests and recommend using Tylenol for pain instead of NSAIDs.   -HYPERTENSION: Hypertension means high blood pressure. Your blood pressure is well controlled with your current medications, so you should continue taking Carvedilol and Losartan Hydrochlorothiazide as prescribed.  -ANXIETY/DEPRESSION: You have a history of anxiety and depression. Since you no longer wish to take Citalopram, we will discontinue this medication.  -MUSCLE SPASTICITY DUE TO STROKE: Muscle spasticity means you have muscle stiffness or spasms. You are managing this well with home exercises and Dantrolene, so you should continue taking Dantrolene three times a day.  Keep your appointment with Dr. Hermelinda Medicus.  INSTRUCTIONS:  We will await the results of your blood tests and communicate the results to you via MyChart within 24-48 hours.

## 2023-07-01 LAB — LP+NON-HDL CHOLESTEROL
Cholesterol, Total: 122 mg/dL (ref 100–199)
HDL: 36 mg/dL — ABNORMAL LOW (ref >39)
LDL Chol Calc (NIH): 56 mg/dL (ref 0–99)
Total Non-HDL-Chol (LDL+VLDL): 86 mg/dL (ref 0–129)
Triglycerides: 176 mg/dL — ABNORMAL HIGH (ref 0–149)
VLDL Cholesterol Cal: 30 mg/dL (ref 5–40)

## 2023-07-01 LAB — CBC WITH DIFFERENTIAL/PLATELET
Basophils Absolute: 0 10*3/uL (ref 0.0–0.2)
Basos: 1 %
EOS (ABSOLUTE): 0.2 10*3/uL (ref 0.0–0.4)
Eos: 5 %
Hematocrit: 47.5 % (ref 37.5–51.0)
Hemoglobin: 15.6 g/dL (ref 13.0–17.7)
Immature Grans (Abs): 0 10*3/uL (ref 0.0–0.1)
Immature Granulocytes: 0 %
Lymphocytes Absolute: 1.3 10*3/uL (ref 0.7–3.1)
Lymphs: 27 %
MCH: 30.3 pg (ref 26.6–33.0)
MCHC: 32.8 g/dL (ref 31.5–35.7)
MCV: 92 fL (ref 79–97)
Monocytes Absolute: 0.4 10*3/uL (ref 0.1–0.9)
Monocytes: 9 %
Neutrophils Absolute: 2.8 10*3/uL (ref 1.4–7.0)
Neutrophils: 58 %
Platelets: 291 10*3/uL (ref 150–450)
RBC: 5.15 x10E6/uL (ref 4.14–5.80)
RDW: 12.4 % (ref 11.6–15.4)
WBC: 4.8 10*3/uL (ref 3.4–10.8)

## 2023-07-01 LAB — CMP14+EGFR
ALT: 18 [IU]/L (ref 0–44)
AST: 20 [IU]/L (ref 0–40)
Albumin: 4.7 g/dL (ref 3.8–4.9)
Alkaline Phosphatase: 64 [IU]/L (ref 44–121)
BUN/Creatinine Ratio: 19 (ref 9–20)
BUN: 28 mg/dL — ABNORMAL HIGH (ref 6–24)
Bilirubin Total: 0.4 mg/dL (ref 0.0–1.2)
CO2: 24 mmol/L (ref 20–29)
Calcium: 10.1 mg/dL (ref 8.7–10.2)
Chloride: 98 mmol/L (ref 96–106)
Creatinine, Ser: 1.5 mg/dL — ABNORMAL HIGH (ref 0.76–1.27)
Globulin, Total: 3 g/dL (ref 1.5–4.5)
Glucose: 93 mg/dL (ref 70–99)
Potassium: 4.6 mmol/L (ref 3.5–5.2)
Sodium: 138 mmol/L (ref 134–144)
Total Protein: 7.7 g/dL (ref 6.0–8.5)
eGFR: 55 mL/min/{1.73_m2} — ABNORMAL LOW (ref >59)

## 2023-07-21 ENCOUNTER — Other Ambulatory Visit: Payer: Self-pay | Admitting: Physical Medicine & Rehabilitation

## 2023-07-21 DIAGNOSIS — F329 Major depressive disorder, single episode, unspecified: Secondary | ICD-10-CM

## 2023-09-02 ENCOUNTER — Encounter: Payer: Medicare Other | Attending: Physical Medicine & Rehabilitation | Admitting: Physical Medicine & Rehabilitation

## 2023-09-02 ENCOUNTER — Encounter: Payer: Self-pay | Admitting: Physical Medicine & Rehabilitation

## 2023-09-02 VITALS — BP 112/72 | HR 62 | Ht 72.0 in

## 2023-09-02 DIAGNOSIS — G8114 Spastic hemiplegia affecting left nondominant side: Secondary | ICD-10-CM | POA: Insufficient documentation

## 2023-09-02 MED ORDER — ABOBOTULINUMTOXINA 500 UNITS IM SOLR
1000.0000 [IU] | Freq: Once | INTRAMUSCULAR | Status: AC
  Administered 2023-09-02: 1000 [IU] via INTRAMUSCULAR

## 2023-09-02 NOTE — Progress Notes (Signed)
 Dysport  Injection for spasticity using needle EMG guidance Indication:  Spastic hemiparesis of left nondominant side (HCC) - Plan: AbobotulinumtoxinA  (DYSPORT ) 500 units injection 1,000 Units  G81.14 Dilution: 500 Units/2ml        Total Units Injected:  1000 Indication: Severe spasticity which interferes with ADL,mobility and/or  hygiene and is unresponsive to medication management and other conservative care Informed consent was obtained after describing risks and benefits of the procedure with the patient. This includes bleeding, bruising, infection, excessive weakness, or medication side effects. A REMS form is on file and signed.  left Needle: 50mm injectable monopolar needle electrode  Number of units per muscle Pectoralis Major 0 units Pectoralis Minor 0 units Biceps 0 units Brachioradialis 0 units FCR 100 units FCU 100 units FDS 300 units FDP 300 units FPL 0 units Pronator Teres 200 units Pronator Quadratus 0 units Lumbricals 0 units  All injections were done after obtaining appropriate EMG activity and after negative drawback for blood. The patient tolerated the procedure well. Post procedure instructions were given.

## 2023-09-02 NOTE — Patient Instructions (Signed)
 ALWAYS FEEL FREE TO CALL OUR OFFICE WITH ANY PROBLEMS OR QUESTIONS [phone])  **PLEASE NOTE** ALL MEDICATION REFILL REQUESTS (INCLUDING CONTROLLED SUBSTANCES) NEED TO BE MADE AT LEAST 7 DAYS PRIOR TO REFILL BEING DUE. ANY REFILL REQUESTS INSIDE THAT TIME FRAME MAY RESULT IN DELAYS IN RECEIVING YOUR PRESCRIPTION.

## 2023-09-24 ENCOUNTER — Other Ambulatory Visit: Payer: Self-pay | Admitting: Family Medicine

## 2023-09-24 DIAGNOSIS — I129 Hypertensive chronic kidney disease with stage 1 through stage 4 chronic kidney disease, or unspecified chronic kidney disease: Secondary | ICD-10-CM

## 2023-11-30 ENCOUNTER — Other Ambulatory Visit: Payer: Self-pay | Admitting: Family Medicine

## 2023-11-30 ENCOUNTER — Other Ambulatory Visit: Payer: Self-pay | Admitting: Physical Medicine & Rehabilitation

## 2023-11-30 DIAGNOSIS — Z8673 Personal history of transient ischemic attack (TIA), and cerebral infarction without residual deficits: Secondary | ICD-10-CM

## 2023-11-30 DIAGNOSIS — G8114 Spastic hemiplegia affecting left nondominant side: Secondary | ICD-10-CM

## 2023-12-02 ENCOUNTER — Encounter: Payer: Self-pay | Admitting: Physical Medicine & Rehabilitation

## 2023-12-02 ENCOUNTER — Encounter: Payer: Medicare Other | Attending: Physical Medicine & Rehabilitation | Admitting: Physical Medicine & Rehabilitation

## 2023-12-02 VITALS — BP 126/78 | HR 56 | Ht 72.0 in

## 2023-12-02 DIAGNOSIS — G8114 Spastic hemiplegia affecting left nondominant side: Secondary | ICD-10-CM

## 2023-12-02 MED ORDER — ABOBOTULINUMTOXINA 500 UNITS IM SOLR
1000.0000 [IU] | Freq: Once | INTRAMUSCULAR | Status: AC
  Administered 2023-12-02: 1000 [IU] via INTRAMUSCULAR

## 2023-12-02 NOTE — Patient Instructions (Signed)
 ALWAYS FEEL FREE TO CALL OUR OFFICE WITH ANY PROBLEMS OR QUESTIONS [phone])  **PLEASE NOTE** ALL MEDICATION REFILL REQUESTS (INCLUDING CONTROLLED SUBSTANCES) NEED TO BE MADE AT LEAST 7 DAYS PRIOR TO REFILL BEING DUE. ANY REFILL REQUESTS INSIDE THAT TIME FRAME MAY RESULT IN DELAYS IN RECEIVING YOUR PRESCRIPTION.

## 2023-12-02 NOTE — Progress Notes (Signed)
 Dysport  Injection for spasticity using needle EMG guidance Indication:  Spastic hemiparesis of left nondominant side (HCC) - Plan: AbobotulinumtoxinA  (DYSPORT ) 500 units injection 1,000 Units   Dilution: 500 Units/2ml        Total Units Injected:  1000 Indication: Severe spasticity which interferes with ADL,mobility and/or  hygiene and is unresponsive to medication management and other conservative care Informed consent was obtained after describing risks and benefits of the procedure with the patient. This includes bleeding, bruising, infection, excessive weakness, or medication side effects. A REMS form is on file and signed.  LUE Needle: 50mm injectable monopolar needle electrode  Number of units per muscle Pectoralis Major 0 units Pectoralis Minor 0 units Biceps 0 units Brachioradialis 100 units FCR 125 units FCU 125 units FDS 250 units FDP 250 units FPL 0 units Pronator Teres 150 units Pronator Quadratus 0 units Lumbricals 0 units  All injections were done after obtaining appropriate EMG activity and after negative drawback for blood. The patient tolerated the procedure well. Post procedure instructions were given.

## 2023-12-25 ENCOUNTER — Other Ambulatory Visit: Payer: Self-pay | Admitting: Family Medicine

## 2023-12-25 DIAGNOSIS — I129 Hypertensive chronic kidney disease with stage 1 through stage 4 chronic kidney disease, or unspecified chronic kidney disease: Secondary | ICD-10-CM

## 2023-12-25 DIAGNOSIS — Z8673 Personal history of transient ischemic attack (TIA), and cerebral infarction without residual deficits: Secondary | ICD-10-CM

## 2023-12-25 NOTE — Telephone Encounter (Signed)
 Copied from CRM [id number]. Topic: Clinical - Medication Refill >> Dec 25, 2023  9:26 AM Ethelle Herb L wrote: Medication:  rosuvastatin  (CRESTOR ) 20 MG tablet   Has the patient contacted their pharmacy? No [facility] calling on behalf of patient to request refill.   This is the patient's preferred pharmacy:  [facility] #[id number] [place] Kind[place], [place] - [address] AT [facility] [address] [place] [address] Phone: [phone] Fax: [phone] Is this the correct pharmacy for this prescription? Yes   Has the prescription been filled recently? No  Is the patient out of the medication? No  Has the patient been seen for an appointment in the last year OR does the patient have an upcoming appointment? Yes  Can we respond through MyChart? No  Agent: Please be advised that Rx refills may take up to 3 business days. We ask that you follow-up with your pharmacy.

## 2023-12-26 NOTE — Telephone Encounter (Signed)
 Requested medication (s) are due for refill today: Yes  Requested medication (s) are on the active medication list: Yes  Last refill:  11/30/23  Future visit scheduled: No  Notes to clinic:  Unable to refill per protocol, appointment needed, last refill given courtesy 30 day, provider approval needed     Requested Prescriptions  Pending Prescriptions Disp Refills   rosuvastatin  (CRESTOR ) 20 MG tablet 30 tablet 0     Cardiovascular:  Antilipid - Statins 2 Failed - 12/26/2023  4:23 PM      Failed - Cr in normal range and within 360 days    Creatinine, Ser  Date Value Ref Range Status  06/30/2023 1.50 (H) 0.76 - 1.27 mg/dL Final         Failed - Lipid Panel in normal range within the last 12 months    Cholesterol, Total  Date Value Ref Range Status  06/30/2023 122 100 - 199 mg/dL Final   LDL Chol Calc (NIH)  Date Value Ref Range Status  06/30/2023 56 0 - 99 mg/dL Final   HDL  Date Value Ref Range Status  06/30/2023 36 (L) >39 mg/dL Final   Triglycerides  Date Value Ref Range Status  06/30/2023 176 (H) 0 - 149 mg/dL Final         Passed - Patient is not pregnant      Passed - Valid encounter within last 12 months    Recent Outpatient Visits           5 months ago Hemiparesis affecting left side as late effect of cerebrovascular accident (CVA) (HCC)   [place] [facility] A Dept Of [place]. [facility] Joaquin Mulberry, MD   1 year ago Hemiparesis affecting left side as late effect of cerebrovascular accident (CVA) [facility])   [place] [facility] - A Dept Of [place]. [facility] Joaquin Mulberry, MD   1 year ago Prediabetes   [place] [facility] [place] - A Dept Of [place]. [facility] Joaquin Mulberry, MD   2 years ago Essential hypertension   [place] [facility] - A Dept Of [place]. [facility] Joaquin Mulberry, MD   2 years ago Essential hypertension    [facility] [place] - A  Dept Of . [facility] Joaquin Mulberry, MD

## 2023-12-28 MED ORDER — ROSUVASTATIN CALCIUM 20 MG PO TABS: 20.0000 mg | ORAL_TABLET | Freq: Every day | ORAL | 0 refills | Status: DC

## 2023-12-29 DIAGNOSIS — Z008 Encounter for other general examination: Secondary | ICD-10-CM | POA: Diagnosis not present

## 2023-12-29 DIAGNOSIS — Z6825 Body mass index (BMI) 25.0-25.9, adult: Secondary | ICD-10-CM | POA: Diagnosis not present

## 2023-12-29 DIAGNOSIS — F324 Major depressive disorder, single episode, in partial remission: Secondary | ICD-10-CM | POA: Diagnosis not present

## 2023-12-29 DIAGNOSIS — I129 Hypertensive chronic kidney disease with stage 1 through stage 4 chronic kidney disease, or unspecified chronic kidney disease: Secondary | ICD-10-CM | POA: Diagnosis not present

## 2023-12-29 DIAGNOSIS — E663 Overweight: Secondary | ICD-10-CM | POA: Diagnosis not present

## 2023-12-29 DIAGNOSIS — N1831 Chronic kidney disease, stage 3a: Secondary | ICD-10-CM | POA: Diagnosis not present

## 2023-12-29 DIAGNOSIS — E785 Hyperlipidemia, unspecified: Secondary | ICD-10-CM | POA: Diagnosis not present

## 2023-12-29 DIAGNOSIS — I5032 Chronic diastolic (congestive) heart failure: Secondary | ICD-10-CM | POA: Diagnosis not present

## 2023-12-29 DIAGNOSIS — F17211 Nicotine dependence, cigarettes, in remission: Secondary | ICD-10-CM | POA: Diagnosis not present

## 2024-01-11 DIAGNOSIS — T24211A Burn of second degree of right thigh, initial encounter: Secondary | ICD-10-CM | POA: Diagnosis not present

## 2024-01-11 DIAGNOSIS — T310 Burns involving less than 10% of body surface: Secondary | ICD-10-CM | POA: Diagnosis not present

## 2024-01-11 DIAGNOSIS — T24231A Burn of second degree of right lower leg, initial encounter: Secondary | ICD-10-CM | POA: Diagnosis not present

## 2024-01-11 DIAGNOSIS — Z23 Encounter for immunization: Secondary | ICD-10-CM | POA: Diagnosis not present

## 2024-02-04 ENCOUNTER — Other Ambulatory Visit: Payer: Self-pay | Admitting: Family Medicine

## 2024-02-04 DIAGNOSIS — Z8673 Personal history of transient ischemic attack (TIA), and cerebral infarction without residual deficits: Secondary | ICD-10-CM

## 2024-02-04 NOTE — Telephone Encounter (Signed)
 Copied from CRM [id number]. Topic: Clinical - Medication Refill >> Feb 04, 2024 10:08 AM Burnard DEL wrote: Medication:  rosuvastatin  (CRESTOR ) 20 MG tablet   Has the patient contacted their pharmacy? Yes (Agent: If no, request that the patient contact the pharmacy for the refill. If patient does not wish to contact the pharmacy document the reason why and proceed with request.) (Agent: If yes, when and what did the pharmacy advise?)  This is the patient's preferred pharmacy:  [facility] DR[facility] STORE #[id number] [place], [place] - [address] AT [facility] [address] [place] [address] Phone: [phone] Fax: [phone]    Is this the correct pharmacy for this prescription? Yes If no, delete pharmacy and type the correct one.   Has the prescription been filled recently? No  Is the patient out of the medication? NO(few tablets left)  Has the patient been seen for an appointment in the last year OR does the patient have an upcoming appointment? Yes  Can we respond through MyChart? Yes  Agent: Please be advised that Rx refills may take up to 3 business days. We ask that you follow-up with your pharmacy.

## 2024-02-05 MED ORDER — ROSUVASTATIN CALCIUM 20 MG PO TABS: 20.0000 mg | ORAL_TABLET | Freq: Every day | ORAL | 0 refills | Status: DC

## 2024-02-05 NOTE — Telephone Encounter (Signed)
 Requested Prescriptions  Pending Prescriptions Disp Refills   rosuvastatin  (CRESTOR ) 20 MG tablet 90 tablet 0    Sig: Take 1 tablet (20 mg total) by mouth daily. Please schedule an appt with PCP for additional refills.     Cardiovascular:  Antilipid - Statins 2 Failed - 02/05/2024  3:36 PM      Failed - Cr in normal range and within 360 days    Creatinine, Ser  Date Value Ref Range Status  06/30/2023 1.50 (H) 0.76 - 1.27 mg/dL Final         Failed - Lipid Panel in normal range within the last 12 months    Cholesterol, Total  Date Value Ref Range Status  06/30/2023 122 100 - 199 mg/dL Final   LDL Chol Calc (NIH)  Date Value Ref Range Status  06/30/2023 56 0 - 99 mg/dL Final   HDL  Date Value Ref Range Status  06/30/2023 36 (L) >39 mg/dL Final   Triglycerides  Date Value Ref Range Status  06/30/2023 176 (H) 0 - 149 mg/dL Final         Passed - Patient is not pregnant      Passed - Valid encounter within last 12 months    Recent Outpatient Visits           7 months ago Hemiparesis affecting left side as late effect of cerebrovascular accident (CVA) (HCC)   [place] [facility] - A Dept Of [place]. [facility] Delbert Clam, MD   1 year ago Hemiparesis affecting left side as late effect of cerebrovascular accident (CVA) [facility])   [place] [facility] - A Dept Of [place]. [facility] Delbert Clam, MD   1 year ago Prediabetes   [place] [facility] [place] - A Dept Of [place]. [facility] Delbert Clam, MD   2 years ago Essential hypertension   [place] [facility] - A Dept Of [place] Delbert Clam, MD   2 years ago Essential hypertension   [place] [facility] [place] - A Dept Of [place]. [facility] Delbert Clam, MD

## 2024-02-11 ENCOUNTER — Other Ambulatory Visit: Payer: Self-pay | Admitting: Physical Medicine & Rehabilitation

## 2024-02-11 DIAGNOSIS — F329 Major depressive disorder, single episode, unspecified: Secondary | ICD-10-CM

## 2024-02-18 ENCOUNTER — Other Ambulatory Visit: Payer: Self-pay | Admitting: Family Medicine

## 2024-02-18 DIAGNOSIS — I129 Hypertensive chronic kidney disease with stage 1 through stage 4 chronic kidney disease, or unspecified chronic kidney disease: Secondary | ICD-10-CM

## 2024-03-09 ENCOUNTER — Encounter: Payer: Self-pay | Admitting: Physical Medicine & Rehabilitation

## 2024-03-09 ENCOUNTER — Encounter: Attending: Physical Medicine & Rehabilitation | Admitting: Physical Medicine & Rehabilitation

## 2024-03-09 VITALS — BP 107/69 | HR 55 | Ht 73.0 in | Wt 184.7 lb

## 2024-03-09 DIAGNOSIS — G8114 Spastic hemiplegia affecting left nondominant side: Secondary | ICD-10-CM | POA: Diagnosis present

## 2024-03-09 MED ORDER — SODIUM CHLORIDE (PF) 0.9 % IJ SOLN
2.0000 mL | Freq: Once | INTRAMUSCULAR | Status: AC
  Administered 2024-03-09 (×2): 2 mL

## 2024-03-09 MED ORDER — ABOBOTULINUMTOXINA 300 UNITS IM SOLR
1000.0000 [IU] | Freq: Once | INTRAMUSCULAR | Status: AC
  Administered 2024-03-09 (×2): 1000 [IU] via INTRAMUSCULAR

## 2024-03-09 NOTE — Addendum Note (Signed)
 Addended by: BABS HUSSAR T on: 03/09/2024 02:43 PM   Modules accepted: Orders

## 2024-03-09 NOTE — Patient Instructions (Signed)
 ALWAYS FEEL FREE TO CALL OUR OFFICE WITH ANY PROBLEMS OR QUESTIONS [phone])  **PLEASE NOTE** ALL MEDICATION REFILL REQUESTS (INCLUDING CONTROLLED SUBSTANCES) NEED TO BE MADE AT LEAST 7 DAYS PRIOR TO REFILL BEING DUE. ANY REFILL REQUESTS INSIDE THAT TIME FRAME MAY RESULT IN DELAYS IN RECEIVING YOUR PRESCRIPTION.

## 2024-03-09 NOTE — Progress Notes (Signed)
 Dysport  Injection for spasticity using needle EMG guidance Indication:  Spastic hemiparesis of left nondominant side (HCC) G81.14  Dilution: 500 Units/68ml        Total Units Injected:  1000 Indication: Severe spasticity which interferes with ADL,mobility and/or  hygiene and is unresponsive to medication management and other conservative care Informed consent was obtained after describing risks and benefits of the procedure with the patient. This includes bleeding, bruising, infection, excessive weakness, or medication side effects. A REMS form is on file and signed.  left Needle: 50mm injectable monopolar needle electrode  Number of units per muscle Pectoralis Major 0 units Pectoralis Minor 0 units Biceps 350 units Brachioradialis 150 units FCR 75 units FCU 75 units FDS 100 units FDP 100 units FPL 50 units Pronator Teres 100 units Pronator Quadratus 0 units Lumbricals 0 units  All injections were done after obtaining appropriate EMG activity and after negative drawback for blood. The patient tolerated the procedure well. Post procedure instructions were given.

## 2024-04-04 DIAGNOSIS — M24522 Contracture, left elbow: Secondary | ICD-10-CM | POA: Diagnosis not present

## 2024-04-04 DIAGNOSIS — M24532 Contracture, left wrist: Secondary | ICD-10-CM | POA: Diagnosis not present

## 2024-04-04 DIAGNOSIS — G8114 Spastic hemiplegia affecting left nondominant side: Secondary | ICD-10-CM | POA: Diagnosis not present

## 2024-04-04 DIAGNOSIS — M24542 Contracture, left hand: Secondary | ICD-10-CM | POA: Diagnosis not present

## 2024-04-12 ENCOUNTER — Telehealth: Payer: Self-pay | Admitting: Physical Medicine & Rehabilitation

## 2024-04-12 DIAGNOSIS — G8114 Spastic hemiplegia affecting left nondominant side: Secondary | ICD-10-CM

## 2024-04-12 NOTE — Telephone Encounter (Signed)
 I Made Paul [name] a referral for outpt PT at [facility]. I let him know.  thx

## 2024-05-12 ENCOUNTER — Other Ambulatory Visit: Payer: Self-pay | Admitting: Family Medicine

## 2024-05-12 DIAGNOSIS — Z8673 Personal history of transient ischemic attack (TIA), and cerebral infarction without residual deficits: Secondary | ICD-10-CM

## 2024-05-19 ENCOUNTER — Other Ambulatory Visit: Payer: Self-pay | Admitting: Physical Medicine & Rehabilitation

## 2024-05-19 DIAGNOSIS — F329 Major depressive disorder, single episode, unspecified: Secondary | ICD-10-CM

## 2024-05-29 ENCOUNTER — Other Ambulatory Visit: Payer: Self-pay | Admitting: Family Medicine

## 2024-05-29 DIAGNOSIS — I129 Hypertensive chronic kidney disease with stage 1 through stage 4 chronic kidney disease, or unspecified chronic kidney disease: Secondary | ICD-10-CM

## 2024-05-31 ENCOUNTER — Other Ambulatory Visit: Payer: Self-pay | Admitting: Family Medicine

## 2024-05-31 DIAGNOSIS — I129 Hypertensive chronic kidney disease with stage 1 through stage 4 chronic kidney disease, or unspecified chronic kidney disease: Secondary | ICD-10-CM

## 2024-05-31 NOTE — Telephone Encounter (Signed)
 Requested medications are due for refill today.  yes  Requested medications are on the active medications list.  yes  Last refill. 06/30/2023 #180 1 rf  Future visit scheduled.   Yes - a wellness   Notes to clinic.  Pt is more than 3 months overdue for an ov.    Requested Prescriptions  Pending Prescriptions Disp Refills   carvedilol  (COREG ) 6.25 MG tablet [Pharmacy Med Name: CARVEDILOL  6.25MG  TABLETS] 180 tablet 1    Sig: TAKE 1 TABLET(6.25 MG) BY MOUTH TWICE DAILY WITH A MEAL     Cardiovascular: Beta Blockers 3 Failed - 05/31/2024  1:43 PM      Failed - Cr in normal range and within 360 days    Creatinine, Ser  Date Value Ref Range Status  06/30/2023 1.50 (H) 0.76 - 1.27 mg/dL Final         Failed - Valid encounter within last 6 months    Recent Outpatient Visits           11 months ago Hemiparesis affecting left side as late effect of cerebrovascular accident (CVA) (HCC)   [place] [facility] - A Dept Of [place]. [facility] Delbert Clam, MD   1 year ago Hemiparesis affecting left side as late effect of cerebrovascular accident (CVA) [facility])   [place] [facility] - A Dept Of [place]. [facility] Delbert Clam, MD   1 year ago Prediabetes   [place] [facility] [place] - A Dept Of [place]. [facility] Delbert Clam, MD   2 years ago Essential hypertension   [place] [facility] [place] - A Dept Of [place]. [facility] Delbert Clam, MD   2 years ago Essential hypertension   [place] [facility] [place] - A Dept Of . [facility] Delbert, Clam, MD              Passed - AST in normal range and within 360 days    AST  Date Value Ref Range Status  06/30/2023 20 0 - 40 IU/L Final         Passed - ALT in normal range and within 360 days    ALT  Date Value Ref Range Status  06/30/2023 18 0 - 44 IU/L Final         Passed - Last BP in normal range    BP Readings from  Last 1 Encounters:  03/09/24 107/69         Passed - Last Heart Rate in normal range    Pulse Readings from Last 1 Encounters:  03/09/24 (!) 55

## 2024-06-01 ENCOUNTER — Other Ambulatory Visit: Payer: Self-pay | Admitting: Family Medicine

## 2024-06-01 DIAGNOSIS — I129 Hypertensive chronic kidney disease with stage 1 through stage 4 chronic kidney disease, or unspecified chronic kidney disease: Secondary | ICD-10-CM

## 2024-06-15 ENCOUNTER — Encounter: Attending: Physical Medicine & Rehabilitation | Admitting: Physical Medicine & Rehabilitation

## 2024-06-15 ENCOUNTER — Encounter: Payer: Self-pay | Admitting: Physical Medicine & Rehabilitation

## 2024-06-15 VITALS — BP 102/66 | HR 66 | Ht 73.0 in | Wt 181.0 lb

## 2024-06-15 DIAGNOSIS — G8114 Spastic hemiplegia affecting left nondominant side: Secondary | ICD-10-CM | POA: Insufficient documentation

## 2024-06-15 MED ORDER — SODIUM CHLORIDE (PF) 0.9 % IJ SOLN: 10.0000 mL | INTRAMUSCULAR | Status: AC | PRN

## 2024-06-15 MED ORDER — ABOBOTULINUMTOXINA 500 UNITS IM SOLR
500.0000 [IU] | Freq: Once | INTRAMUSCULAR | Status: AC
  Administered 2024-06-15: 500 [IU] via INTRAMUSCULAR

## 2024-06-15 NOTE — Patient Instructions (Signed)
 JAS splint---JOINT ACTIVATING SYSTEM FOR WRIST

## 2024-06-15 NOTE — Progress Notes (Signed)
 Dysport  Injection for spasticity using needle EMG guidance Indication:  Spastic hemiparesis of left nondominant side (HCC) - Plan: AbobotulinumtoxinA  (DYSPORT ) 500 units injection 500 Units, sodium chloride  (PF) 0.9 % injection 10 mL   Dilution: 500 Units/2ml        Total Units Injected:  1000 Indication: Severe spasticity which interferes with ADL,mobility and/or  hygiene and is unresponsive to medication management and other conservative care Informed consent was obtained after describing risks and benefits of the procedure with the patient. This includes bleeding, bruising, infection, excessive weakness, or medication side effects. A REMS form is on file and signed.  left Needle: 50mm injectable monopolar needle electrode  Number of units per muscle Pectoralis Major 0 units Pectoralis Minor 0 units Biceps 300 units Brachioradialis 200 units FCR 50 units FCU 50 units FDS 150 units FDP 150 units FPL 0 units Pronator Teres 100 units Pronator Quadratus 0 units Lumbricals 0 units  All injections were done after obtaining appropriate EMG activity and after negative drawback for blood. The patient tolerated the procedure well. Post procedure instructions were given.

## 2024-07-01 ENCOUNTER — Other Ambulatory Visit: Payer: Self-pay | Admitting: Pharmacist

## 2024-07-01 DIAGNOSIS — I129 Hypertensive chronic kidney disease with stage 1 through stage 4 chronic kidney disease, or unspecified chronic kidney disease: Secondary | ICD-10-CM

## 2024-07-01 DIAGNOSIS — Z8673 Personal history of transient ischemic attack (TIA), and cerebral infarction without residual deficits: Secondary | ICD-10-CM

## 2024-07-01 MED ORDER — ROSUVASTATIN CALCIUM 20 MG PO TABS: 20.0000 mg | ORAL_TABLET | Freq: Every day | ORAL | 0 refills | Status: DC

## 2024-07-01 MED ORDER — LOSARTAN POTASSIUM-HCTZ 100-25 MG PO TABS: 1.0000 | ORAL_TABLET | Freq: Every day | ORAL | 0 refills | Status: DC

## 2024-07-01 NOTE — Progress Notes (Signed)
 Pharmacy Quality Measure Review  This patient is appearing on a report for being at risk of failing the adherence measure for cholesterol (statin) and hypertension (ACEi/ARB) medications this calendar year.   Medication: losartan -HCTZ Last fill date: 06/03/2024 for 30 day supply  Contacted pharmacy to facilitate refills.  Medication: rosuvastatin  Last fill date: 05/18/2024 for 30 day supply  Contacted pharmacy to facilitate refills.  Both rxns were out of refills as patient is overdue for an appt. Sent 1 month of medication today to his pharmacy. Called patient to inform but could not leave a VM. MyChart sent to patient.   Herlene Fleeta Morris, PharmD, JAQUELINE, CPP Clinical Pharmacist [facility] [phone]

## 2024-07-02 ENCOUNTER — Other Ambulatory Visit: Payer: Self-pay | Admitting: Family Medicine

## 2024-07-02 DIAGNOSIS — I129 Hypertensive chronic kidney disease with stage 1 through stage 4 chronic kidney disease, or unspecified chronic kidney disease: Secondary | ICD-10-CM

## 2024-07-02 DIAGNOSIS — Z8673 Personal history of transient ischemic attack (TIA), and cerebral infarction without residual deficits: Secondary | ICD-10-CM

## 2024-07-04 MED ORDER — LOSARTAN POTASSIUM-HCTZ 100-25 MG PO TABS: 1.0000 | ORAL_TABLET | Freq: Every day | ORAL | 0 refills | Status: AC

## 2024-07-08 ENCOUNTER — Other Ambulatory Visit: Payer: Self-pay | Admitting: Family Medicine

## 2024-07-08 DIAGNOSIS — I129 Hypertensive chronic kidney disease with stage 1 through stage 4 chronic kidney disease, or unspecified chronic kidney disease: Secondary | ICD-10-CM

## 2024-07-08 NOTE — Telephone Encounter (Signed)
 Copied from CRM #[id number]. Topic: Clinical - Medication Refill >> Jul 08, 2024 10:03 AM Viola F wrote: Medication: carvedilol  (COREG ) 6.25 MG tablet [[id number]]  Has the patient contacted their pharmacy? Yes (Agent: If no, request that the patient contact the pharmacy for the refill. If patient does not wish to contact the pharmacy document the reason why and proceed with request.) (Agent: If yes, when and what did the pharmacy advise?)  This is the patient's preferred pharmacy:  [facility] DR[facility] STORE #[id number] [place], [place] - [address] AT [facility] [address] [place] [address] Phone: [phone] Fax: [phone]  [facility] [address] [place] [address] Phone: [phone] Fax: [phone]  Is this the correct pharmacy for this prescription? Yes If no, delete pharmacy and type the correct one.   Has the prescription been filled recently? Yes  Is the patient out of the medication? Yes  Has the patient been seen for an appointment in the last year OR does the patient have an upcoming appointment? No  Can we respond through MyChart? Yes  Agent: Please be advised that Rx refills may take up to 3 business days. We ask that you follow-up with your pharmacy.

## 2024-07-11 NOTE — Telephone Encounter (Signed)
 Requested Prescriptions  Refused Prescriptions Disp Refills   carvedilol  (COREG ) 6.25 MG tablet 180 tablet 0     Cardiovascular: Beta Blockers 3 Failed - 07/11/2024  2:46 PM      Failed - Cr in normal range and within 360 days    Creatinine, Ser  Date Value Ref Range Status  06/30/2023 1.50 (H) 0.76 - 1.27 mg/dL Final         Failed - AST in normal range and within 360 days    AST  Date Value Ref Range Status  06/30/2023 20 0 - 40 IU/L Final         Failed - ALT in normal range and within 360 days    ALT  Date Value Ref Range Status  06/30/2023 18 0 - 44 IU/L Final         Failed - Valid encounter within last 6 months    Recent Outpatient Visits           1 year ago Hemiparesis affecting left side as late effect of cerebrovascular accident (CVA) (HCC)   [place] [facility] - A Dept Of [place]. [facility] Delbert Clam, MD   1 year ago Hemiparesis affecting left side as late effect of cerebrovascular accident (CVA) [facility])   [place] [facility] - A Dept Of [place]. [facility] Delbert Clam, MD   2 years ago Prediabetes   [place] [facility] [place] - A Dept Of [place]. [facility] Delbert Clam, MD   2 years ago Essential hypertension   [place] [facility] [place] - A Dept Of [place]. [facility] Delbert Clam, MD   2 years ago Essential hypertension   [place] [facility] [place] - A Dept Of [place]. [facility] [place], Clam, MD              Passed - Last BP in normal range    BP Readings from Last 1 Encounters:  06/15/24 102/66         Passed - Last Heart Rate in normal range    Pulse Readings from Last 1 Encounters:  06/15/24 66

## 2024-07-13 ENCOUNTER — Other Ambulatory Visit: Payer: Self-pay | Admitting: Pharmacist

## 2024-07-13 ENCOUNTER — Telehealth: Payer: Self-pay | Admitting: Family Medicine

## 2024-07-13 DIAGNOSIS — I129 Hypertensive chronic kidney disease with stage 1 through stage 4 chronic kidney disease, or unspecified chronic kidney disease: Secondary | ICD-10-CM

## 2024-07-13 MED ORDER — CARVEDILOL 6.25 MG PO TABS: 6.2500 mg | ORAL_TABLET | Freq: Two times a day (BID) | ORAL | 0 refills | Status: AC

## 2024-07-13 NOTE — Telephone Encounter (Signed)
 Hi Paul [name],  Would it be possible for you to please send in the prescription refill?

## 2024-07-13 NOTE — Telephone Encounter (Signed)
 Thank you. Contacted patient and notified that the prescription has been sent.

## 2024-07-13 NOTE — Telephone Encounter (Signed)
 Copied from CRM #[id number]. Topic: Clinical - Prescription Issue >> Jul 11, 2024  9:06 AM Kendralyn S wrote:  Reason for CRM: calling to check on carvedilol  meds, havent got it yet.  >> Jul 13, 2024  8:39 AM Emylou G wrote:  Please call patient back.SABRA He is checking status of refill.SABRA He said he isn't making the request to soon for the refill.   >> Jul 12, 2024  9:16 AM Jeoffrey H wrote:  Patient called back and stated he still has not been able to pick up his prescription. Uses [facility] on [address].    [phone]

## 2024-08-01 ENCOUNTER — Other Ambulatory Visit: Payer: Self-pay | Admitting: Family Medicine

## 2024-08-01 DIAGNOSIS — Z8673 Personal history of transient ischemic attack (TIA), and cerebral infarction without residual deficits: Secondary | ICD-10-CM

## 2024-08-02 NOTE — Telephone Encounter (Signed)
 Requested Prescriptions  Refused Prescriptions Disp Refills   rosuvastatin  (CRESTOR ) 20 MG tablet [Pharmacy Med Name: ROSUVASTATIN  20MG  TABLETS] 90 tablet 0    Sig: TAKE 1 TABLET(20 MG) BY MOUTH DAILY     Cardiovascular:  Antilipid - Statins 2 Failed - 08/02/2024  2:08 PM      Failed - Cr in normal range and within 360 days    Creatinine, Ser  Date Value Ref Range Status  06/30/2023 1.50 (H) 0.76 - 1.27 mg/dL Final         Failed - Valid encounter within last 12 months    Recent Outpatient Visits           1 year ago Hemiparesis affecting left side as late effect of cerebrovascular accident (CVA) (HCC)   [place] [facility] - A Dept Of [place]. [facility] Delbert Clam, MD   1 year ago Hemiparesis affecting left side as late effect of cerebrovascular accident (CVA) [facility])   [place] [facility] - A Dept Of [place]. [facility] Delbert Clam, MD   2 years ago Prediabetes   [place] [facility] [place] - A Dept Of [place]. [facility] Delbert Clam, MD   2 years ago Essential hypertension   [place] [facility] [place] - A Dept Of [place]. [facility] Delbert Clam, MD   2 years ago Essential hypertension   [place] [facility] [place] - A Dept Of [place]. [facility] Delbert Clam, MD              Failed - Lipid Panel in normal range within the last 12 months    Cholesterol, Total  Date Value Ref Range Status  06/30/2023 122 100 - 199 mg/dL Final   LDL Chol Calc (NIH)  Date Value Ref Range Status  06/30/2023 56 0 - 99 mg/dL Final   HDL  Date Value Ref Range Status  06/30/2023 36 (L) >39 mg/dL Final   Triglycerides  Date Value Ref Range Status  06/30/2023 176 (H) 0 - 149 mg/dL Final         Passed - Patient is not pregnant

## 2024-08-08 ENCOUNTER — Other Ambulatory Visit: Payer: Self-pay | Admitting: Family Medicine

## 2024-08-08 DIAGNOSIS — I129 Hypertensive chronic kidney disease with stage 1 through stage 4 chronic kidney disease, or unspecified chronic kidney disease: Secondary | ICD-10-CM

## 2024-08-08 DIAGNOSIS — Z8673 Personal history of transient ischemic attack (TIA), and cerebral infarction without residual deficits: Secondary | ICD-10-CM

## 2024-08-09 ENCOUNTER — Ambulatory Visit: Attending: Family Medicine

## 2024-08-09 VITALS — Ht 72.0 in | Wt 180.0 lb

## 2024-08-09 DIAGNOSIS — Z0000 Encounter for general adult medical examination without abnormal findings: Secondary | ICD-10-CM

## 2024-08-09 NOTE — Telephone Encounter (Signed)
 Refills requested too early. Requested Prescriptions  Refused Prescriptions Disp Refills   rosuvastatin  (CRESTOR ) 20 MG tablet [Pharmacy Med Name: ROSUVASTATIN  20MG  TABLETS] 90 tablet 0    Sig: TAKE 1 TABLET(20 MG) BY MOUTH DAILY     Cardiovascular:  Antilipid - Statins 2 Failed - 08/09/2024  3:47 PM      Failed - Cr in normal range and within 360 days    Creatinine, Ser  Date Value Ref Range Status  06/30/2023 1.50 (H) 0.76 - 1.27 mg/dL Final         Failed - Lipid Panel in normal range within the last 12 months    Cholesterol, Total  Date Value Ref Range Status  06/30/2023 122 100 - 199 mg/dL Final   LDL Chol Calc (NIH)  Date Value Ref Range Status  06/30/2023 56 0 - 99 mg/dL Final   HDL  Date Value Ref Range Status  06/30/2023 36 (L) >39 mg/dL Final   Triglycerides  Date Value Ref Range Status  06/30/2023 176 (H) 0 - 149 mg/dL Final         Passed - Patient is not pregnant      Passed - Valid encounter within last 12 months    Recent Outpatient Visits           1 year ago Hemiparesis affecting left side as late effect of cerebrovascular accident (CVA) (HCC)   [place] [facility] - A Dept Of [place]. [facility] Delbert Clam, MD   1 year ago Hemiparesis affecting left side as late effect of cerebrovascular accident (CVA) [facility]   [place] [facility] - A Dept Of [place]. [facility] Delbert Clam, MD   2 years ago Prediabetes   [place] [facility] [place] - A Dept Of [place]. [facility] Delbert Clam, MD   2 years ago Essential hypertension   [place] [facility] [place] - A Dept Of [place]. [facility] Delbert Clam, MD   2 years ago Essential hypertension   [place] [facility] [place] - A Dept Of [place]. [facility]bert, Clam, MD               carvedilol  (COREG ) 6.25 MG tablet [Pharmacy Med Name: CARVEDILOL  6.25MG  TABLETS] 180 tablet 0    Sig: TAKE 1  TABLET(6.25 MG) BY MOUTH TWICE DAILY WITH A MEAL     Cardiovascular: Beta Blockers 3 Failed - 08/09/2024  3:47 PM      Failed - Cr in normal range and within 360 days    Creatinine, Ser  Date Value Ref Range Status  06/30/2023 1.50 (H) 0.76 - 1.27 mg/dL Final         Failed - AST in normal range and within 360 days    AST  Date Value Ref Range Status  06/30/2023 20 0 - 40 IU/L Final         Failed - ALT in normal range and within 360 days    ALT  Date Value Ref Range Status  06/30/2023 18 0 - 44 IU/L Final         Passed - Last BP in normal range    BP Readings from Last 1 Encounters:  06/15/24 102/66         Passed - Last Heart Rate in normal range    Pulse Readings from Last 1 Encounters:  06/15/24 66  Passed - Valid encounter within last 6 months    Recent Outpatient Visits           1 year ago Hemiparesis affecting left side as late effect of cerebrovascular accident (CVA) (HCC)   [place] [facility] - A Dept Of [place]. [facility] Delbert Clam, MD   1 year ago Hemiparesis affecting left side as late effect of cerebrovascular accident (CVA) [facility])   [place] [facility] - A Dept Of [place]. [facility] Delbert Clam, MD   2 years ago Prediabetes   [place] [facility] [place] - A Dept Of [place]. [facility] Delbert Clam, MD   2 years ago Essential hypertension   [place] [facility] [place] - A Dept Of [place]. [facility] Delbert Clam, MD   2 years ago Essential hypertension   [place] [facility] [place] - A Dept Of [place]. [facility] Delbert Clam, MD
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
## 2024-08-09 NOTE — Patient Instructions (Signed)
 Mr. Hyser,  Thank you for taking the time for your Medicare Wellness Visit. I appreciate your continued commitment to your health goals. Please review the care plan we discussed, and feel free to reach out if I can assist you further.  Please note that Annual Wellness Visits do not include a physical exam. Some assessments may be limited, especially if the visit was conducted virtually. If needed, we may recommend an in-person follow-up with your provider.  Ongoing Care Seeing your primary care provider every 3 to 6 months helps us  monitor your health and provide consistent, personalized care.   Referrals If a referral was made during today's visit and you haven't received any updates within two weeks, please contact the referred provider directly to check on the status.  Recommended Screenings:  Health Maintenance  Topic Date Due   Pneumococcal Vaccine for age over 77 (1 of 2 - PCV) Never done   Hepatitis B Vaccine (1 of 3 - 19+ 3-dose series) Never done   Flu Shot  Never done   Medicare Annual Wellness Visit  03/09/2024   COVID-19 Vaccine (1 - 2025-26 season) Never done   Colon Cancer Screening  01/09/2029   Hepatitis C Screening  Completed   HIV Screening  Completed   Zoster (Shingles) Vaccine  Completed   HPV Vaccine  Aged Out   Meningitis B Vaccine  Aged Out   DTaP/Tdap/Td vaccine  Discontinued       08/09/2024    9:20 AM  Advanced Directives  Does Patient Have a Medical Advance Directive? No  Would patient like information on creating a medical advance directive? No - Patient declined    Vision: Annual vision screenings are recommended for early detection of glaucoma, cataracts, and diabetic retinopathy. These exams can also reveal signs of chronic conditions such as diabetes and high blood pressure.  Dental: Annual dental screenings help detect early signs of oral cancer, gum disease, and other conditions linked to overall health, including heart disease and  diabetes.  Please see the attached documents for additional preventive care recommendations.

## 2024-08-10 ENCOUNTER — Other Ambulatory Visit: Payer: Self-pay | Admitting: Physical Medicine & Rehabilitation

## 2024-08-10 DIAGNOSIS — G8114 Spastic hemiplegia affecting left nondominant side: Secondary | ICD-10-CM

## 2024-08-12 ENCOUNTER — Telehealth: Payer: Self-pay | Admitting: Family Medicine

## 2024-08-12 DIAGNOSIS — Z8673 Personal history of transient ischemic attack (TIA), and cerebral infarction without residual deficits: Secondary | ICD-10-CM

## 2024-08-12 MED ORDER — ROSUVASTATIN CALCIUM 20 MG PO TABS: 20.0000 mg | ORAL_TABLET | Freq: Every day | ORAL | 0 refills | Status: AC

## 2024-08-12 NOTE — Telephone Encounter (Signed)
 Copied from CRM [id number]. Topic: Clinical - Medication Question >> Aug 12, 2024 10:09 AM   Delon DASEN wrote:  Reason for CRM: rosuvastatin  (CRESTOR ) 20 MG tablet- checking on refill request- Chloe with [facility]- [phone]

## 2024-08-12 NOTE — Telephone Encounter (Signed)
 Last appointment was 06/30/23 and his nest appointment is in March.

## 2024-08-12 NOTE — Addendum Note (Signed)
 Addended by: Renatta Shrieves on: 08/12/2024 11:50 AM   Modules accepted: Orders

## 2024-08-12 NOTE — Telephone Encounter (Signed)
 Refilled

## 2024-08-26 ENCOUNTER — Other Ambulatory Visit: Payer: Self-pay | Admitting: Physical Medicine & Rehabilitation

## 2024-08-26 DIAGNOSIS — G8114 Spastic hemiplegia affecting left nondominant side: Secondary | ICD-10-CM

## 2024-08-26 MED ORDER — DANTROLENE SODIUM 50 MG PO CAPS: 50.0000 mg | ORAL_CAPSULE | Freq: Three times a day (TID) | ORAL | 6 refills | Status: AC

## 2024-09-21 ENCOUNTER — Encounter: Admitting: Physical Medicine & Rehabilitation

## 2024-09-28 ENCOUNTER — Ambulatory Visit: Payer: Self-pay | Admitting: Family Medicine
